# Patient Record
Sex: Male | Born: 1965 | Race: Black or African American | Hispanic: No | Smoking: Current some day smoker
Health system: Southern US, Community
[De-identification: ages and names within clinical notes are randomized; demographics above are authoritative.]

## PROBLEM LIST (undated history)

## (undated) DIAGNOSIS — R45851 Suicidal ideations: Secondary | ICD-10-CM

## (undated) DIAGNOSIS — D571 Sickle-cell disease without crisis: Secondary | ICD-10-CM

## (undated) DIAGNOSIS — R011 Cardiac murmur, unspecified: Secondary | ICD-10-CM

## (undated) DIAGNOSIS — J302 Other seasonal allergic rhinitis: Secondary | ICD-10-CM

## (undated) DIAGNOSIS — D649 Anemia, unspecified: Secondary | ICD-10-CM

## (undated) DIAGNOSIS — J189 Pneumonia, unspecified organism: Secondary | ICD-10-CM

## (undated) DIAGNOSIS — F32A Depression, unspecified: Secondary | ICD-10-CM

## (undated) HISTORY — DX: Other seasonal allergic rhinitis: J30.2

## (undated) HISTORY — DX: Suicidal ideations: R45.851

## (undated) HISTORY — PX: NO PAST SURGERIES: SHX2092

## (undated) HISTORY — PX: ROOT CANAL: SHX2363

---

## 2010-10-20 ENCOUNTER — Emergency Department (HOSPITAL_COMMUNITY)
Admission: EM | Admit: 2010-10-20 | Discharge: 2010-10-20 | Payer: Self-pay | Source: Home / Self Care | Admitting: Emergency Medicine

## 2011-09-22 ENCOUNTER — Emergency Department (HOSPITAL_COMMUNITY)
Admission: EM | Admit: 2011-09-22 | Discharge: 2011-09-22 | Disposition: A | Discharge status: Home / Self Care | Payer: Medicare Other | Attending: Emergency Medicine | Admitting: Emergency Medicine

## 2011-09-22 ENCOUNTER — Encounter: Payer: Self-pay | Admitting: Emergency Medicine

## 2011-09-22 DIAGNOSIS — R6889 Other general symptoms and signs: Secondary | ICD-10-CM | POA: Insufficient documentation

## 2011-09-22 DIAGNOSIS — R059 Cough, unspecified: Secondary | ICD-10-CM | POA: Insufficient documentation

## 2011-09-22 DIAGNOSIS — J069 Acute upper respiratory infection, unspecified: Secondary | ICD-10-CM | POA: Insufficient documentation

## 2011-09-22 DIAGNOSIS — D571 Sickle-cell disease without crisis: Secondary | ICD-10-CM | POA: Insufficient documentation

## 2011-09-22 DIAGNOSIS — J3489 Other specified disorders of nose and nasal sinuses: Secondary | ICD-10-CM | POA: Insufficient documentation

## 2011-09-22 DIAGNOSIS — R05 Cough: Secondary | ICD-10-CM | POA: Insufficient documentation

## 2011-09-22 HISTORY — DX: Sickle-cell disease without crisis: D57.1

## 2011-09-22 HISTORY — DX: Pneumonia, unspecified organism: J18.9

## 2011-09-22 HISTORY — DX: Cardiac murmur, unspecified: R01.1

## 2011-09-22 MED ORDER — DIPHENHYDRAMINE HCL 25 MG PO CAPS
ORAL_CAPSULE | ORAL | Status: AC
  Administered 2011-09-22: 25 mg
  Filled 2011-09-22: qty 1

## 2011-09-22 MED ORDER — DIPHENHYDRAMINE HCL 25 MG PO CAPS: 25.0000 mg | ORAL_CAPSULE | Freq: Once | ORAL | Status: DC

## 2011-09-22 MED ORDER — DIPHENHYDRAMINE HCL 25 MG PO CAPS: ORAL_CAPSULE | ORAL | Status: DC

## 2011-09-22 NOTE — ED Provider Notes (Signed)
Medical screening examination/treatment/procedure(s) were performed by non-physician practitioner and as supervising physician I was immediately available for consultation/collaboration.    Nelia Shi, MD 09/22/11 2045
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
## 2011-09-22 NOTE — ED Provider Notes (Signed)
History     CSN: [id number] Arrival date & time: 09/22/2011  8:02 AM   First MD Initiated Contact with Patient 09/22/11 [phone]      Chief Complaint  Patient presents with  . Cough  . Nasal Congestion    runny nose    (Consider location/radiation/quality/duration/timing/severity/associated sxs/prior treatment) HPI  Patient presents to ER complaining of a 3 day hx of gradual onset runny nose and productive cough as well as a "tickle in throat" that he states has been persistent over the last 3 days despite taking OTC decongestant and cough medicine. Patient states he took an OTC "mucus cough medicine" which "helped loosen up the junk" but states despite taking medications ongoing cough. Denies fevers, chills, HA, sore throat, ear ache, rash, stiff neck, CP, SOB, or wheezing. Patient states his girl friend had similar symptoms about a week ago. Patient states he has hx of sickle cell but takes no meds on regular basis. Denies aggravating or alleviating factors.  Past Medical History  Diagnosis Date  . Sickle cell anemia   . Heart murmur   . Pneumonia     Past Surgical History  Procedure Date  . Root canal     History reviewed. No pertinent family history.  History  Substance Use Topics  . Smoking status: Current Everyday Smoker  . Smokeless tobacco: Never Used  . Alcohol Use: Yes     occasional      Review of Systems  All other systems reviewed and are negative.    Allergies  Review of patient's allergies indicates no known allergies.  Home Medications   Current Outpatient Rx  Name Route Sig Dispense Refill  . FOLIC ACID 1 MG PO TABS Oral Take 1 mg by mouth daily.      Marland Kitchen ZINC PO Oral Take 1 tablet by mouth daily.      Marland Kitchen OVER THE COUNTER MEDICATION Oral Take 2 tablets by mouth daily as needed. Sinus Allergy- for runny nose     . DIPHENHYDRAMINE HCL 25 MG PO CAPS  1 PO q 6 hours for nasal drainage 30 capsule 0    BP 109/52  Pulse 81  Temp(Src) 98.5 F (36.9  C) (Oral)  Resp 18  SpO2 95%  Physical Exam  Vitals reviewed. Constitutional: He is oriented to person, place, and time. He appears well-developed and well-nourished. No distress.  HENT:  Head: Normocephalic and atraumatic.  Right Ear: External ear normal.  Left Ear: External ear normal.  Nose: Nose normal.  Mouth/Throat: No oropharyngeal exudate.       Mild erythema of posterior pharynx and tonsils no tonsillar exudate or enlargement. Patent airway. Swallowing secretions well. Cobbling of posterior pharynx with drainage.  Eyes: Conjunctivae and EOM are normal. Pupils are equal, round, and reactive to light.  Neck: Normal range of motion. Neck supple.  Cardiovascular: Normal rate, regular rhythm and normal heart sounds.  Exam reveals no gallop and no friction rub.   No murmur heard. Pulmonary/Chest: Effort normal and breath sounds normal. No respiratory distress. He has no wheezes. He has no rales. He exhibits no tenderness.  Abdominal: Soft. He exhibits no distension and no mass. There is no tenderness. There is no rebound and no guarding.  Lymphadenopathy:    He has no cervical adenopathy.  Neurological: He is alert and oriented to person, place, and time. He has normal reflexes.  Skin: Skin is warm and dry. No rash noted. He is not diaphoretic.  Psychiatric: He has a normal  mood and affect.    ED Course  Procedures (including critical care time)  PO ibuprofen  Labs Reviewed - No data to display No results found.   1. Upper respiratory tract infection       MDM  Patient afebrile and non toxic appearing. Signs and symptoms of URI with normal lung exam, pulse ox >95% on room air and patient afebrile.         Lenon Oms [place], [place] 09/22/11 [phone]

## 2011-09-22 NOTE — ED Notes (Signed)
Pt reports productive cough and runny nose x 3 days. Yellow secretions noted by pt.

## 2011-11-23 ENCOUNTER — Ambulatory Visit (INDEPENDENT_AMBULATORY_CARE_PROVIDER_SITE_OTHER): Payer: Medicare Other | Admitting: Sports Medicine

## 2011-11-23 ENCOUNTER — Encounter: Payer: Self-pay | Admitting: Sports Medicine

## 2011-11-23 DIAGNOSIS — Z0000 Encounter for general adult medical examination without abnormal findings: Secondary | ICD-10-CM

## 2011-11-23 DIAGNOSIS — J309 Allergic rhinitis, unspecified: Secondary | ICD-10-CM

## 2011-11-23 DIAGNOSIS — D571 Sickle-cell disease without crisis: Secondary | ICD-10-CM

## 2011-11-23 DIAGNOSIS — J302 Other seasonal allergic rhinitis: Secondary | ICD-10-CM

## 2011-11-23 NOTE — Patient Instructions (Signed)
It was nice to meet you today.  We have collected labs and will let you know if there are any abnormal results.  We are waiting to get records from [facility] regarding your past medical history.  Because we do not have documentation of your sickle cell disease we cannot sign your housing form at this time.  Once we have this information we would be glad to fill it out for you.  Please let us know if there is anything we can do for your health care needs.  We would like to see you as needed and would otherwise like to see you in 6 months to see how you are doing.

## 2011-11-24 LAB — COMPREHENSIVE METABOLIC PANEL
ALT: 10 U/L (ref 0–53)
Albumin: 4.2 g/dL (ref 3.5–5.2)
Alkaline Phosphatase: 135 U/L — ABNORMAL HIGH (ref 39–117)
CO2: 24 mEq/L (ref 19–32)
Glucose, Bld: 77 mg/dL (ref 70–99)
Potassium: 4.2 mEq/L (ref 3.5–5.3)
Sodium: 142 mEq/L (ref 135–145)
Total Bilirubin: 1.4 mg/dL — ABNORMAL HIGH (ref 0.3–1.2)
Total Protein: 7.2 g/dL (ref 6.0–8.3)

## 2011-11-24 LAB — CBC
Hemoglobin: 8.8 g/dL — ABNORMAL LOW (ref 13.0–17.0)
MCH: 28.8 pg (ref 26.0–34.0)
MCHC: 33.8 g/dL (ref 30.0–36.0)
Platelets: 202 10*3/uL (ref 150–400)
RBC: 3.06 MIL/uL — ABNORMAL LOW (ref 4.22–5.81)

## 2011-11-29 ENCOUNTER — Encounter: Payer: Self-pay | Admitting: Sports Medicine

## 2011-11-29 DIAGNOSIS — D571 Sickle-cell disease without crisis: Secondary | ICD-10-CM | POA: Insufficient documentation

## 2011-11-29 DIAGNOSIS — J302 Other seasonal allergic rhinitis: Secondary | ICD-10-CM | POA: Insufficient documentation

## 2011-11-29 NOTE — Progress Notes (Signed)
Subjective:  Scott Norton is a 46 y.o. male presenting today to establish care.  He has a known history of Sickle Cell disease with unknown genotype.  He reports that he has previously been seen at [facility] but is living in [place] and would like to establish care here.  Please see history sections for further evaluation.   ROS  Constitutional Denies fatigue or recent pain crisis  Infectious No fevers, no chills  Resp No cough no congestion  Cardiac No chest pain, no palpitations, known heart murmur  GI No abnormal BM, no BRBPR or Melana  GU Denies urinary sx  Skin No rashes  Psych Denies known psych history  Neuro No prior strokes  MSK No MSK issues  Trauma Denies recent or major trauma in past  Activity Very active, reports that as long as he isn't having a pain crisis not limited in his daily life; on disability for his SS  Diet Not assessed  MEDS Denies current immunosuppression but has taken hydroxyurea in the past    Past Medical Hx Reviewed: yes Medications Reviewed: yes Family History Reviewed: yes   PE: GENERAL:  Adult AA male.  Examined in [facility]. Alert and appropriate, In no discomfort, no resp distress, HNEENT: AT/[place], MMM, mild scleral icterus, EOMi THORAX: HEART: RRR, S1/S2 heard, 2-3/6 systolic ejection murmur LUNGS: CTA B, no wheezes, no crackles ABDOMEN:  +BS, soft, non-tender, no rigidity, no guarding, no masses/organomegaly EXTREMITIES: Moves all 4 extremities spontaneously, warm well perfused, no edema,   >PSYCH: Thought content congruent, no SI/HI evident, concerned regarding having paperwork filled out today for his housing

## 2011-11-29 NOTE — Assessment & Plan Note (Addendum)
Pt taking folic acid at this time although not on hydroxyurea.  May be an appropriate pt to refer to [facility] clinic once opened. Would consider providing Pain medication at acute visit for pain crisis to prevent hospitalization in future  Currently unable to fill out "CERTIFICATION OF DISABILITY" for [employer] until further records obtained.  Will have pt sign release to obtain further records for [facility].

## 2011-11-29 NOTE — Assessment & Plan Note (Signed)
Pt to continue with home regimen as currently working for him

## 2011-12-04 ENCOUNTER — Telehealth: Payer: Self-pay | Admitting: Sports Medicine

## 2011-12-04 NOTE — Telephone Encounter (Signed)
Scott Norton came in to make [place] aware that is phone is broke, and he is not able to receive calls at this time. Also inquired about paperwork that he gave Berline Chough on his first visit on 11/23/11 to be completed. Jobie states that Berline Chough needed to receive his medical records for [facility] before completing the paperwork. Wanted the know the progress. Pt would like paperwork to be mailed when completed, as we cannot call him to pick it up.

## 2011-12-09 ENCOUNTER — Emergency Department (HOSPITAL_COMMUNITY)
Admission: EM | Admit: 2011-12-09 | Discharge: 2011-12-09 | Disposition: A | Discharge status: Home / Self Care | Payer: No Typology Code available for payment source | Attending: Emergency Medicine | Admitting: Emergency Medicine

## 2011-12-09 ENCOUNTER — Encounter (HOSPITAL_COMMUNITY): Payer: Self-pay | Admitting: Family Medicine

## 2011-12-09 ENCOUNTER — Emergency Department (HOSPITAL_COMMUNITY): Payer: No Typology Code available for payment source

## 2011-12-09 DIAGNOSIS — D571 Sickle-cell disease without crisis: Secondary | ICD-10-CM | POA: Insufficient documentation

## 2011-12-09 DIAGNOSIS — M542 Cervicalgia: Secondary | ICD-10-CM | POA: Insufficient documentation

## 2011-12-09 DIAGNOSIS — S161XXA Strain of muscle, fascia and tendon at neck level, initial encounter: Secondary | ICD-10-CM | POA: Insufficient documentation

## 2011-12-09 DIAGNOSIS — R011 Cardiac murmur, unspecified: Secondary | ICD-10-CM | POA: Insufficient documentation

## 2011-12-09 DIAGNOSIS — S139XXA Sprain of joints and ligaments of unspecified parts of neck, initial encounter: Secondary | ICD-10-CM | POA: Insufficient documentation

## 2011-12-09 DIAGNOSIS — R209 Unspecified disturbances of skin sensation: Secondary | ICD-10-CM | POA: Insufficient documentation

## 2011-12-09 MED ORDER — IBUPROFEN 800 MG PO TABS
800.0000 mg | ORAL_TABLET | Freq: Once | ORAL | Status: AC
  Administered 2011-12-09: 800 mg via ORAL
  Filled 2011-12-09: qty 1

## 2011-12-09 MED ORDER — CYCLOBENZAPRINE HCL 10 MG PO TABS: 10.0000 mg | ORAL_TABLET | Freq: Three times a day (TID) | ORAL | Status: DC | PRN

## 2011-12-09 MED ORDER — IBUPROFEN 800 MG PO TABS: 800.0000 mg | ORAL_TABLET | Freq: Three times a day (TID) | ORAL | Status: DC
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
## 2011-12-14 ENCOUNTER — Encounter: Payer: Self-pay | Admitting: Sports Medicine

## 2011-12-14 ENCOUNTER — Ambulatory Visit (INDEPENDENT_AMBULATORY_CARE_PROVIDER_SITE_OTHER): Payer: Medicare Other | Admitting: Sports Medicine

## 2011-12-14 VITALS — BP 114/71 | HR 94 | Temp 97.3°F | Ht 75.0 in | Wt 170.1 lb

## 2011-12-14 DIAGNOSIS — S139XXA Sprain of joints and ligaments of unspecified parts of neck, initial encounter: Secondary | ICD-10-CM

## 2011-12-14 DIAGNOSIS — D571 Sickle-cell disease without crisis: Secondary | ICD-10-CM

## 2011-12-14 DIAGNOSIS — S161XXA Strain of muscle, fascia and tendon at neck level, initial encounter: Secondary | ICD-10-CM

## 2011-12-14 MED ORDER — MORPHINE SULFATE 15 MG PO TABS: 15.0000 mg | ORAL_TABLET | ORAL | Status: DC | PRN

## 2011-12-14 NOTE — Patient Instructions (Signed)
It was great to see you today.  I'm sorry you have had such a rough week.  I am giving you a prescription for morphine to be taken as needed for you sickle cell pain.  If you have any fevers, chills, cough, or difficulty breathing please call us immediately.    Regarding you back pain.  You can continue to take the Ibuprofen the ED prescribed.  The best thing for the pains are the exercises that I showed you and to continue keeping moving.    I will plan on seeing you back as needed or in 1 year for a checkup.

## 2011-12-20 ENCOUNTER — Emergency Department (HOSPITAL_COMMUNITY)
Admission: EM | Admit: 2011-12-20 | Discharge: 2011-12-20 | Disposition: A | Discharge status: Home Health | Payer: Medicare Other | Attending: Emergency Medicine | Admitting: Emergency Medicine

## 2011-12-20 ENCOUNTER — Encounter (HOSPITAL_COMMUNITY): Payer: Self-pay | Admitting: Emergency Medicine

## 2011-12-20 ENCOUNTER — Emergency Department (HOSPITAL_COMMUNITY): Payer: Medicare Other

## 2011-12-20 DIAGNOSIS — R011 Cardiac murmur, unspecified: Secondary | ICD-10-CM | POA: Insufficient documentation

## 2011-12-20 DIAGNOSIS — D571 Sickle-cell disease without crisis: Secondary | ICD-10-CM | POA: Insufficient documentation

## 2011-12-20 DIAGNOSIS — M542 Cervicalgia: Secondary | ICD-10-CM | POA: Insufficient documentation

## 2011-12-20 NOTE — ED Notes (Signed)
Pt verbalized understanding of d/c inst

## 2011-12-20 NOTE — Discharge Instructions (Signed)
Cervical Sprain and Strain °A cervical sprain is an injury to the neck. The injury can include either over-stretching or even small tears in the ligaments that hold the bones of the neck in place. A strain affects muscles and tendons. Minor injuries usually only involve ligaments and muscles. Because the different parts of the neck are so close together, more severe injuries can involve both sprain and strain. These injuries can affect the muscles, ligaments, tendons, discs, and nerves in the neck. °CAUSES  °An injury may be the result of a direct blow or from certain habits that can lead to the symptoms noted above. °· Injury from:  °· Contact sports (such as football, rugby, wrestling, hockey, auto racing, gymnastics, diving, martial arts, and boxing).  °· Motor vehicle accidents.  °· Whiplash injuries (see image at right). These are common. They occur when the neck is forcefully whipped or forced backward and/or forward.  °· Falls.  °· Lifestyle or awkward postures:  °· Cradling a telephone between the ear and shoulder.  °· Sitting in a chair that offers no support.  °· Working at an ill-designed computer station.  °· Activities that require hours of repeated or long periods of looking up (stretching the neck backward) or looking down (bending the head/neck forward).  °SYMPTOMS  °· Pain, soreness, stiffness, or burning sensation in the front, back, or sides of the neck. This may develop immediately after injury. Onset of discomfort may also develop slowly and not begin for 24 hours or more.  °· Shoulder and/or upper back pain.  °· Limits to the normal movement of the neck.  °· Headache.  °· Dizziness.  °· Weakness and/or abnormal sensation (such as numbness or tingling) of one or both arms and/or hands.  °· Muscle spasm.  °· Difficulty with swallowing or chewing.  °· Tenderness and swelling at the injury site.  °DIAGNOSIS  °Most of the time, your caregiver can diagnose this problem with a careful history and  examination. The history will include information about known problems (such as arthritis in the neck) or a previous neck injury. X-rays may be ordered to find out if there is a different problem. X-rays can also help to find problems with the bones of the neck not related to the injury or current symptoms. °TREATMENT  °Several treatment options are available to help pain, spasm, and other symptoms. They include: °· Cold helps relieve pain and reduce inflammation. Cold should be applied for 10 to 15 minutes every 2 to 3 hours after any activity that aggravates your symptoms. Use ice packs or an ice massage. Place a towel or cloth in between your skin and the ice pack.  °· Medication:  °· Only take over-the-counter or prescription medicines for pain, discomfort, or fever as directed by your caregiver.  °· Pain relievers or muscle relaxants may be prescribed. Use only as directed and only as much as you need.  °· Change in the activity that caused the problem. This might include using a headset with a telephone so that the phone is not propped between your ear and shoulder.  °· Neck collar. Your caregiver may recommend temporary use of a soft cervical collar.  °· Work station. Changes may be needed in your work place. A better sitting position and/or better posture during work may be part of your treatment.  °· Physical Therapy. Your caregiver may recommend physical therapy. This can include instructions in the use of stretching and strengthening exercises. Improvement in posture is important.   Exercises and posture training can help stabilize the neck and strengthen muscles and keep symptoms from returning.  °HOME CARE INSTRUCTIONS  °Other than formal physical therapy, all treatments above can be done at home. Even when not at work, it is important to be conscious of your posture and of activities that can cause a return of symptoms. °Most cervical sprains and/or strains are better in 1-3 weeks. As you improve and  increase activities, doing a warm up and stretching before the activity will help prevent recurrent problems. °SEEK MEDICAL CARE IF:  °· Pain is not effectively controlled with medication.  °· You feel unable to decrease pain medication over time as planned.  °· Activity level is not improving as planned and/or expected.  °SEEK IMMEDIATE MEDICAL CARE IF:  °· While using medication, you develop any bleeding, stomach upset, or signs of an allergic reaction.  °· Symptoms get worse, become intolerable, and are not helped by medications.  °· New, unexplained symptoms develop.  °· You experience numbness, tingling, weakness, or paralysis of any part of your body.  °MAKE SURE YOU:  °· Understand these instructions.  °· Will watch your condition.  °· Will get help right away if you are not doing well or get worse.  °Document Released: 08/05/2007 Document Revised: 06/20/2011 Document Reviewed: 08/05/2007 °[employer]® Patient Information ©2012 [employer]. °
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
## 2011-12-20 NOTE — ED Provider Notes (Signed)
Medical screening examination/treatment/procedure(s) were performed by non-physician practitioner and as supervising physician I was immediately available for consultation/collaboration.   [place] Latulippe, MD 12/20/11 1538 
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
## 2011-12-25 NOTE — Assessment & Plan Note (Signed)
Some residual MSK soreness - provided exercises and instructed to take ibuprofen -red flags reviewed
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
## 2012-01-10 ENCOUNTER — Telehealth: Payer: Self-pay | Admitting: Sports Medicine

## 2012-01-10 MED ORDER — IBUPROFEN 600 MG PO TABS: 600.0000 mg | ORAL_TABLET | Freq: Four times a day (QID) | ORAL | Status: AC | PRN

## 2012-01-10 NOTE — Telephone Encounter (Signed)
It does not appear that we have received his records.  Please follow up on obtaining these from [facility] . Scott Norton .   Ibuprofen rxd

## 2012-01-10 NOTE — Telephone Encounter (Addendum)
Patient still inquiring about medical records from [facility].  Also need refill on Ibuprophin to be sent to [facility] on [address].

## 2012-01-10 NOTE — Telephone Encounter (Signed)
Addended by: Gaspar Bidding D on: 01/10/2012 01:27 PM   Modules accepted: Orders

## 2012-02-05 ENCOUNTER — Ambulatory Visit (INDEPENDENT_AMBULATORY_CARE_PROVIDER_SITE_OTHER): Payer: Medicare Other | Admitting: Sports Medicine

## 2012-02-05 ENCOUNTER — Encounter: Payer: Self-pay | Admitting: Sports Medicine

## 2012-02-05 VITALS — BP 108/64 | HR 76 | Temp 98.6°F | Ht 75.0 in | Wt 170.0 lb

## 2012-02-05 DIAGNOSIS — Z23 Encounter for immunization: Secondary | ICD-10-CM

## 2012-02-05 DIAGNOSIS — D571 Sickle-cell disease without crisis: Secondary | ICD-10-CM

## 2012-02-05 MED ORDER — TRAMADOL HCL 50 MG PO TABS: 50.0000 mg | ORAL_TABLET | Freq: Four times a day (QID) | ORAL | Status: DC | PRN

## 2012-02-05 NOTE — Assessment & Plan Note (Signed)
Will prescribe tramadol as patient has not tried this in the past.  If patient unable tolerate or not helping with his pain will re-prescribe morphine as this is known to work for him well.  His crisis is typically mild in character and tramadol likely be more appropriate opioid this time.  No signs of complications including acute chest syndrome

## 2012-02-05 NOTE — Progress Notes (Signed)
HPI:  Scott Norton is a 46 y.o. male presenting today for evaluation of a 3 day sickle cell pain crisis.  States that it has been in his ankles his knees elbows hands.  This is typical for him.  Ran out of his morphine approximately 2 weeks prior to the onset does report some constipation with morphine.  Reports the severity was as high as 9/10 but at this time as a 5/10   ROS  Constitutional  no malaise   Infectious  no fevers, no chills   Resp  no cough, no congestion,     Past Medical Hx Reviewed: yes - significant for sickle cell disease (unknown genotype at this time as pending records still from  [facility]) Medications Reviewed: yes Family History Reviewed: yes  PE: GENERAL:  Adult African American male male.  Examined in [facility].  Sitting comfortably on exam table, able to ambulate easily into room  In no discomfort; norespiratory distress.   PSYCH: Alert and appropriately interactive  HNEENT: AT/[place], MMM, no scleral icterus, EOMi THORAX: HEART: RRR, S1/S2 heard, II/VI systolic murmur LUNGS: CTA B, no wheezes, no crackles ABDOMEN:  +BS, soft, non-tender, no rigidity, no guarding, no masses/organomegaly EXTREMITIES: Moves all 4 extremities spontaneously, warm well perfused, no edema, bilateral DP and PT pulses 2/4.  Slight warmth and erythema over right lateral knee.  Otherwise full range of motion and no erythema or warmth associated.

## 2012-02-05 NOTE — Patient Instructions (Addendum)
It was nice to see you today.      Today we discussed:   Sickle Cell Pain Crisis:  I have sent in a prescription for Tramadol.  I expect this medication to work well for you and think that it will keep you from having some of the side effects from the morphine.  Your Smoking:  Keep thinking about cutting back.  There is a 1800 Quit Now to help people with cutting back on their smoking.  I would be glad to talk to you more about your smoking when you are ready  Please plan to return to see me as needed.  If you need anything prior to seeing me please call the clinic.

## 2012-02-09 ENCOUNTER — Encounter (HOSPITAL_COMMUNITY): Payer: Self-pay | Admitting: *Deleted

## 2012-02-09 ENCOUNTER — Emergency Department (HOSPITAL_COMMUNITY)
Admission: EM | Admit: 2012-02-09 | Discharge: 2012-02-09 | Disposition: A | Discharge status: Home Health | Payer: Medicare Other | Attending: Emergency Medicine | Admitting: Emergency Medicine

## 2012-02-09 DIAGNOSIS — L259 Unspecified contact dermatitis, unspecified cause: Secondary | ICD-10-CM | POA: Insufficient documentation

## 2012-02-09 DIAGNOSIS — Z79899 Other long term (current) drug therapy: Secondary | ICD-10-CM | POA: Insufficient documentation

## 2012-02-09 DIAGNOSIS — D5700 Hb-SS disease with crisis, unspecified: Secondary | ICD-10-CM | POA: Insufficient documentation

## 2012-02-09 DIAGNOSIS — T7840XA Allergy, unspecified, initial encounter: Secondary | ICD-10-CM

## 2012-02-09 DIAGNOSIS — F17200 Nicotine dependence, unspecified, uncomplicated: Secondary | ICD-10-CM | POA: Insufficient documentation

## 2012-02-09 DIAGNOSIS — L299 Pruritus, unspecified: Secondary | ICD-10-CM | POA: Insufficient documentation

## 2012-02-09 MED ORDER — DIPHENHYDRAMINE HCL 25 MG PO CAPS
25.0000 mg | ORAL_CAPSULE | Freq: Once | ORAL | Status: AC
  Administered 2012-02-09: 25 mg via ORAL
  Filled 2012-02-09: qty 1

## 2012-02-09 MED ORDER — FAMOTIDINE 20 MG PO TABS
20.0000 mg | ORAL_TABLET | Freq: Once | ORAL | Status: AC
  Administered 2012-02-09: 20 mg via ORAL
  Filled 2012-02-09: qty 1

## 2012-02-09 NOTE — Discharge Instructions (Signed)
Stop taking the Tramadol.  Call your doctor and let him know about the allergic reaction.  Read instructions below to learn more about your diagnosis and for reasons to return to the ED. Followup with your doctor if symptoms are not improving after 3-4 days.  If you do not have a doctor use the resource guide listed below to help he find one. You may return to the emergency department if symptoms worsen, become progressive, or become more concerning.  Allergic Reaction  There are many allergens around [place]. It may be difficult to know what caused your reaction. You may follow up with an allergy specialist for further testing to learn more about your specific allergies.   TREATMENT AND HOME CARE INSTRUCTIONS  If hives or rash are present:  Use an over-the-counter antihistamine (Benadryl or Zyrtec) for hives and itching as needed. Do not drive or drink alcohol until medications used to treat the reaction have worn off. Antihistamines tend to make people sleepy.  Apply cold cloths (compresses) to the skin or take baths in cool water. This will help itching. Avoid hot baths or showers. Heat will make a rash and itching worse.  See Your Primary Care Doctor if:  Your allergies are becoming progressively more troublesome.  You suspect a food allergy. Symptoms generally happen within 30 minutes of eating a food.  Your symptoms have not gone away within 2 days.  SEEK IMMEDIATE MEDICAL CARE IF:  You develop difficulty breathing or wheezing, or have a tight feeling in your chest or throat (feeling like your throat is closing) You develop swollen lips or tongue You develop hives on your chest, neck or face. You are unable to swallow fluids or salvia secretions.   A severe reaction with any of the above problems should be considered life-threatening. If you suddenly develop difficulty breathing call for local emergency medical help. THIS IS AN EMERGENCY.

## 2012-02-09 NOTE — ED Notes (Signed)
The pt thinks he is having an allergic reaction to tramadol.  He has been taking since Tuesday,  He has had a headache for 2 days with hives and itching

## 2012-02-09 NOTE — ED Provider Notes (Signed)
History     CSN: [id number]  Arrival date & time 02/09/12  4098   First MD Initiated Contact with Patient 02/09/12 0700      Chief Complaint  Patient presents with  . Rash    (Consider location/radiation/quality/duration/timing/severity/associated sxs/prior treatment) HPI Comments: Patient reports that he started taking Tramadol 4 days ago and developed a raised pruitic rash 3 days ago.  He states that he has been taking the Tramadol daily since that time and has developed a rash daily.  He states that the rash typically resolves on its own after a couple of hours.  He has not taken anything for the rash.  He denies any swelling of the lips, tongue, or throat.  Denies SOB, wheezing, dyspnea, or difficulty swallowing.  He was taking the Tramadol for Sickle Cell pain.  He has never taken Tramadol in the past.  Patient is a 46 y.o. male presenting with rash. The history is provided by the patient.  Rash     Past Medical History  Diagnosis Date  . Sickle cell anemia   . Heart murmur   . Pneumonia     ?ACS requring intubation  . Seasonal allergies     wool & grass    Past Surgical History  Procedure Date  . Root canal     Family History  Problem Relation Age of Onset  . Alcohol abuse Mother   . Alcohol abuse Father   . Sickle cell trait Mother   . Sickle cell trait Father   . Early death Father     due to alcoholism  . Hypertension Mother   . Hypertension Father   . Hypertension Sister   . Hypertension Brother   . Hypertension Maternal Grandmother   . Hypertension Maternal Grandfather   . Hypertension Paternal Grandmother   . Hypertension Paternal Grandfather     History  Substance Use Topics  . Smoking status: Current Everyday Smoker -- 0.5 packs/day    Types: Cigars  . Smokeless tobacco: Never Used  . Alcohol Use: Yes     occasional but indicated etoh abuse on health hx questionaire      Review of Systems  Constitutional: Negative for fever and chills.    HENT: Negative for facial swelling, rhinorrhea, trouble swallowing and neck stiffness.   Respiratory: Negative for chest tightness, shortness of breath and wheezing.   Gastrointestinal: Negative for nausea and vomiting.  Skin: Positive for rash.    Allergies  Tramadol  Home Medications   Current Outpatient Rx  Name Route Sig Dispense Refill  . CYCLOBENZAPRINE HCL 10 MG PO TABS Oral Take 10 mg by mouth daily as needed. For muscle spasms    . FOLIC ACID 1 MG PO TABS Oral Take 1 mg by mouth daily.      BP 111/67  Pulse 60  Temp(Src) 98.1 F (36.7 C) (Oral)  Resp 16  SpO2 96%  Physical Exam  Nursing note and vitals reviewed. Constitutional: He is oriented to person, place, and time. He appears well-developed and well-nourished. No distress.  HENT:  Head: Normocephalic and atraumatic.  Mouth/Throat: Oropharynx is clear and moist and mucous membranes are normal.       No sign of airway obstruction. No edema of face, eyelids, lips, tongue, uvula.Marland Kitchen Uvula midline, no nasal congestion or drooling.  Tongue not elevated. No trismus.  Neck: Trachea normal, normal range of motion and full passive range of motion without pain. Neck supple. Carotid bruit is not present. No tracheal  deviation present.       No stridor  Cardiovascular: Normal rate, regular rhythm, intact distal pulses and normal pulses.        Not tachycardic  Pulmonary/Chest: Effort normal. No stridor.  Musculoskeletal: Normal range of motion.  Neurological: He is alert and oriented to person, place, and time.  Skin: Skin is warm and intact. Rash noted. Rash is urticarial. He is not diaphoretic.       Not diaphoretic. Raised erythematous welts, pruritic in nature, located on the upper right arm. Blanchable urticaria, no petechiae or purpura.   Psychiatric: He has a normal mood and affect. His behavior is normal.    ED Course  Procedures (including critical care time)  Labs Reviewed - No data to display No results  found.   No diagnosis found.    MDM  Patient re-evaluated prior to dc, is hemodynamically stable, in no respiratory distress, and denies the feeling of throat closing. Pt has been advised to take OTC benadryl & return to the ED if they have a mod-severe allergic rxn (s/s including throat closing, difficulty breathing, swelling of lips face or tongue). Pt is to follow up with their PCP. Pt is agreeable with plan & verbalizes understanding.  Patient also instructed to stop taking the Tramadol.        Pascal Lux [place], PA-C 02/09/12 1816

## 2012-02-10 NOTE — ED Provider Notes (Signed)
Medical screening examination/treatment/procedure(s) were performed by non-physician practitioner and as supervising physician I was immediately available for consultation/collaboration.   Tiyah Zelenak, MD 02/10/12 2051 

## 2012-02-11 ENCOUNTER — Telehealth: Payer: Self-pay | Admitting: Sports Medicine

## 2012-02-11 DIAGNOSIS — D571 Sickle-cell disease without crisis: Secondary | ICD-10-CM

## 2012-02-11 NOTE — Telephone Encounter (Signed)
Patient is calling because he had an allergic reaction to Tramadol and wasnts to know if something else can be prescribed.

## 2012-02-12 ENCOUNTER — Other Ambulatory Visit: Payer: Self-pay | Admitting: Sports Medicine

## 2012-02-12 DIAGNOSIS — D571 Sickle-cell disease without crisis: Secondary | ICD-10-CM

## 2012-02-12 MED ORDER — MORPHINE SULFATE 15 MG PO TABS: 15.0000 mg | ORAL_TABLET | ORAL | Status: AC | PRN

## 2012-02-12 MED ORDER — MORPHINE SULFATE 15 MG PO TABS: 15.0000 mg | ORAL_TABLET | ORAL | Status: DC | PRN

## 2012-02-12 NOTE — Telephone Encounter (Signed)
Spoke with patient and informed him of below 

## 2012-02-12 NOTE — Telephone Encounter (Signed)
Will write for [facility].  Please call and have pt come pick up rx.

## 2012-03-12 ENCOUNTER — Other Ambulatory Visit: Payer: Self-pay | Admitting: Sports Medicine

## 2012-03-12 NOTE — Telephone Encounter (Signed)
Patient is calling because he is having Sickle Cell pain and would like a refill of Morphine.  He is hoping that he won't have to be seen to get his medication but if he does, his MD, does not have any opening until well into June.  Please call him back with the decision.

## 2012-03-13 MED ORDER — MORPHINE SULFATE 15 MG PO TABS: 15.0000 mg | ORAL_TABLET | ORAL | Status: DC | PRN
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
## 2012-05-06 ENCOUNTER — Encounter: Payer: Self-pay | Admitting: Sports Medicine

## 2012-05-13 ENCOUNTER — Ambulatory Visit: Payer: Medicare Other | Admitting: Sports Medicine

## 2012-06-03 ENCOUNTER — Encounter: Payer: Self-pay | Admitting: Sports Medicine

## 2012-06-04 ENCOUNTER — Encounter: Payer: Self-pay | Admitting: Sports Medicine

## 2012-06-04 ENCOUNTER — Ambulatory Visit (INDEPENDENT_AMBULATORY_CARE_PROVIDER_SITE_OTHER): Payer: Medicare Other | Admitting: Sports Medicine

## 2012-06-04 VITALS — BP 106/62 | HR 72 | Temp 98.2°F | Ht 75.0 in | Wt 162.0 lb

## 2012-06-04 DIAGNOSIS — Z136 Encounter for screening for cardiovascular disorders: Secondary | ICD-10-CM

## 2012-06-04 DIAGNOSIS — D571 Sickle-cell disease without crisis: Secondary | ICD-10-CM

## 2012-06-04 DIAGNOSIS — Z0000 Encounter for general adult medical examination without abnormal findings: Secondary | ICD-10-CM | POA: Insufficient documentation

## 2012-06-04 LAB — BASIC METABOLIC PANEL
BUN: 8 mg/dL (ref 6–23)
Calcium: 9.3 mg/dL (ref 8.4–10.5)
Glucose, Bld: 76 mg/dL (ref 70–99)
Potassium: 4.6 mEq/L (ref 3.5–5.3)

## 2012-06-04 MED ORDER — MORPHINE SULFATE 15 MG PO TABS: 15.0000 mg | ORAL_TABLET | ORAL | Status: AC | PRN

## 2012-06-04 MED ORDER — IBUPROFEN 600 MG PO TABS: 600.0000 mg | ORAL_TABLET | Freq: Three times a day (TID) | ORAL | Status: AC | PRN

## 2012-06-04 NOTE — Patient Instructions (Addendum)
It was good to see you today.  I would be happy to fill out anything you need to help you with your housing.  I have refilled her morphine and would like for you to take ibuprofen when you're painful or start.  We are checking labs today he is abnormal I will be in touch with you.  Please follow up in 6 months or sooner if needed for a refill of your pain medicine.

## 2012-06-04 NOTE — Progress Notes (Signed)
  [facility]  Patient name: Scott Norton MRN [id number]  Date of birth: May 10, 1966  CC & HPI:  Scott Norton is a 46 y.o. male presenting today for follow-up of Sickle Cell disease.  Reports doing well overall.  No specific concerns today.  Reports pain has been doing well and is out of home meds.  Mild relief with pain meds.    ROS:  No chest pain, no current limb or finger pain.  No dizziness, lightheaded.  No cough, congestion.   Pertinent History Reviewed:  Medical & Surgical Hx:  Reviewed: Significant for Sickle Cell Disease Medications: Reviewed & Updated - see associated section Social History: Reviewed - Significant for occasional cigars.    Objective Findings:  Vitals:  Filed Vitals:   06/04/12 0931  BP: 106/62  Pulse: 72  Temp: 98.2 F (36.8 C)   GENERAL:  Adult African American male male.  Examined in [facility].  Sitting comfortably on exam table, able to ambulate easily into room  In no discomfort; norespiratory distress.   PSYCH: Alert and appropriately interactive  HNEENT: AT/[place], MMM, no scleral icterus, EOMi THORAX: HEART: RRR, S1/S2 heard, II/VI systolic murmur LUNGS: CTA B, no wheezes, no crackles ABDOMEN:  +BS, soft, non-tender, no rigidity, no guarding, no masses/organomegaly EXTREMITIES: Moves all 4 extremities spontaneously, warm well perfused, no edema, bilateral DP and PT pulses 2/4.  Slight warmth and erythema over right lateral knee.  Otherwise full range of motion and no erythema or warmth associated.    Assessment & Plan:

## 2012-06-05 LAB — CBC WITH DIFFERENTIAL/PLATELET
Basophils Absolute: 0 10*3/uL (ref 0.0–0.1)
Lymphocytes Relative: 32 % (ref 12–46)
Lymphs Abs: 2.4 10*3/uL (ref 0.7–4.0)
Neutro Abs: 3.9 10*3/uL (ref 1.7–7.7)
Neutrophils Relative %: 54 % (ref 43–77)
Platelets: 232 10*3/uL (ref 150–400)
RBC: 3.13 MIL/uL — ABNORMAL LOW (ref 4.22–5.81)
RDW: 24.9 % — ABNORMAL HIGH (ref 11.5–15.5)
WBC: 7.3 10*3/uL (ref 4.0–10.5)

## 2012-06-05 LAB — LIPID PANEL
LDL Cholesterol: 60 mg/dL (ref 0–99)
Triglycerides: 135 mg/dL (ref <150)
VLDL: 27 mg/dL (ref 0–40)

## 2012-06-06 LAB — HEMOGLOBINOPATHY EVALUATION
Hgb A2 Quant: 3.6 % — ABNORMAL HIGH (ref 2.2–3.2)
Hgb A: 0 % — ABNORMAL LOW (ref 96.8–97.8)
Hgb F Quant: 3.2 % — ABNORMAL HIGH (ref 0.0–2.0)
Hgb S Quant: 93.2 % — ABNORMAL HIGH

## 2012-06-08 NOTE — Assessment & Plan Note (Signed)
Will provide rx for morphine prn Discussed use of NSAIDs as well

## 2012-06-08 NOTE — Assessment & Plan Note (Signed)
Will check Renal function, cholesterol and CBC

## 2012-08-13 ENCOUNTER — Ambulatory Visit
Admission: RE | Admit: 2012-08-13 | Discharge: 2012-08-13 | Disposition: A | Discharge status: Home / Self Care | Payer: Medicare Other | Source: Ambulatory Visit | Attending: Family Medicine | Admitting: Family Medicine

## 2012-08-13 ENCOUNTER — Ambulatory Visit (INDEPENDENT_AMBULATORY_CARE_PROVIDER_SITE_OTHER): Payer: Medicare Other | Admitting: Family Medicine

## 2012-08-13 ENCOUNTER — Encounter: Payer: Self-pay | Admitting: Family Medicine

## 2012-08-13 VITALS — BP 135/80 | HR 72 | Temp 98.2°F | Ht 75.0 in | Wt 170.0 lb

## 2012-08-13 DIAGNOSIS — J988 Other specified respiratory disorders: Secondary | ICD-10-CM

## 2012-08-13 DIAGNOSIS — D571 Sickle-cell disease without crisis: Secondary | ICD-10-CM

## 2012-08-13 DIAGNOSIS — D5700 Hb-SS disease with crisis, unspecified: Secondary | ICD-10-CM

## 2012-08-13 LAB — CBC WITH DIFFERENTIAL/PLATELET
Basophils Relative: 1 % (ref 0–1)
Hemoglobin: 8.9 g/dL — ABNORMAL LOW (ref 13.0–17.0)
Lymphocytes Relative: 33 % (ref 12–46)
MCHC: 33.5 g/dL (ref 30.0–36.0)
Monocytes Relative: 13 % — ABNORMAL HIGH (ref 3–12)
Neutro Abs: 3.5 10*3/uL (ref 1.7–7.7)
Neutrophils Relative %: 49 % (ref 43–77)
RBC: 3.16 MIL/uL — ABNORMAL LOW (ref 4.22–5.81)
WBC: 7.1 10*3/uL (ref 4.0–10.5)

## 2012-08-13 MED ORDER — MORPHINE SULFATE 15 MG PO TABS: 15.0000 mg | ORAL_TABLET | ORAL | Status: DC | PRN

## 2012-08-13 MED ORDER — LEVOFLOXACIN 500 MG PO TABS: 500.0000 mg | ORAL_TABLET | Freq: Every day | ORAL | Status: DC

## 2012-08-13 NOTE — Patient Instructions (Addendum)
You may have a bacterial pneumonia, but it is more likely that you have a viral infection. I would like to get a chest X-ray at the hospital. Also, we will get blood at the clinic today. If either of these things are abnormal, then I will tell you to start the antibiotic. If you start to feel worse or develop a fever, then please start the medication.   Also, you can take the pain medication as needed for sickle pain.   Take Care,   Dr. Clinton Sawyer

## 2012-08-13 NOTE — Assessment & Plan Note (Signed)
Check Hgb and prescribe Morphine 15 mg PO q 6hr PRN, # 30.

## 2012-08-13 NOTE — Progress Notes (Signed)
  Subjective:    Patient ID: Scott Norton, male    DOB: 1966/08/03, 46 y.o.   MRN: [id number]  HPI  46 year old M with sickle cell anemia presenting with cough and congestion. 4 days of nasal congestion, drainage, and prodcutive cough. He denies fever and dyspnea. He has no known sick contacts. He also notes worsening bone pain over the last week that he assciats with the weather becoming colder and with his illness. He notes that he has pain in his extremities mainly. For pain he takes morphine 15 mg PO PRN. He last took a dose last week and last had it refilled by Dr. Berline Chough in August, with 30 pills dispensed. He has Dilaudid and Percocet listed on his med list, which he states were prescribed by his dentist after a root canal in July. He is no longer taking those medications. Today, he main concern is treatment for his infection.   PMH: Acute chest syndrome with whole blood transfusion in 2009 - Hospitalized at [facility]: lives alone   Review of Systems     Objective:   Physical Exam BP 135/80  Pulse 72  Temp 98.2 F (36.8 C) (Oral)  Ht 6\' 3"  (1.905 m)  Wt 170 lb (77.111 kg)  BMI 21.25 kg/m2 Gen: middle age AAF, thin body habitus, mildy ill appearing, coughing during exam HEENT: NCAT, PERRLA, OP clear and moist, no tonsillar inflammation or exudate, no ear effusions CV: RRR, 2/6 SEM Pulm: CTA-B, no consolidation Abd: no palpable spleen, non tender     Assessment & Plan:  46 y.o M with sickle cell disease disease who presents with a respiratory infection and worsening pain.

## 2012-08-13 NOTE — Assessment & Plan Note (Signed)
This is likely viral, but he is at risk of complications 2/2 his sickle cell disease. Obtain chest X-ray. Patient given Rx for Levoquin to start if he develops fever or dyspnea. If CXR demonstrates consolidation, then another reason to start Levoquin. F/u PRN.

## 2012-08-14 ENCOUNTER — Telehealth: Payer: Self-pay | Admitting: Family Medicine

## 2012-08-14 NOTE — Telephone Encounter (Signed)
I called Scott Norton as a follow up to his visit yesterday. I notified him that his chest X-ray and CBC were normal. He also states that he is feeling better. Therefore, he does not need to take antibiotics. He is in agreement with this plan.

## 2012-10-13 ENCOUNTER — Encounter: Payer: Self-pay | Admitting: Family Medicine

## 2012-10-13 ENCOUNTER — Ambulatory Visit (INDEPENDENT_AMBULATORY_CARE_PROVIDER_SITE_OTHER): Payer: Medicare Other | Admitting: Family Medicine

## 2012-10-13 VITALS — BP 114/71 | HR 82 | Temp 98.4°F

## 2012-10-13 DIAGNOSIS — D571 Sickle-cell disease without crisis: Secondary | ICD-10-CM

## 2012-10-13 DIAGNOSIS — D5700 Hb-SS disease with crisis, unspecified: Secondary | ICD-10-CM

## 2012-10-13 LAB — POCT HEMOGLOBIN: Hemoglobin: 8.7 g/dL — AB (ref 14.1–18.1)

## 2012-10-13 MED ORDER — IBUPROFEN 600 MG PO TABS: 600.0000 mg | ORAL_TABLET | Freq: Three times a day (TID) | ORAL | Status: DC | PRN

## 2012-10-13 MED ORDER — MORPHINE SULFATE 15 MG PO TABS: 15.0000 mg | ORAL_TABLET | ORAL | Status: DC | PRN

## 2012-10-13 NOTE — Assessment & Plan Note (Signed)
Refilled morphine 15 mg #30 and ibuprofen 600 mg #60.    Patient also requests letter- would like to move from group housing to private apartment due to concerns about communicable disease from housemates.  States he has spoken with PCP about this before.  I advised, Iw ould send him this message, and asked him if he has a form from a place of residence (he is currently looking for housing), to drop it off at the office

## 2012-10-13 NOTE — Patient Instructions (Addendum)
I will message your primary doctor about your letter request.  He may need to see you in the office or speak to again about this. I recommend a flu shot yearly  Sickle Cell Pain Crisis Sickle cell crisis happens when some of the red blood cells that are the wrong shape (sickle shaped instead of round) get stuck in your small blood vessels. This blocks blood flow through these vessels. HOME CARE To prevent a sickle cell crisis:  Adults should drink at least 8 glasses of liquid every day. For children, this amount should be less.   Do not drink beer, wine, or hard liquor.   Take your medicines as told by your doctor.   Keep all appointments with your doctor.   Find ways to lessen any stress you may have.   Get 8 hours of sleep every night.   Dress warmly in cold weather.   Do not go to places that are high up in the mountains.   If you cut or scrape yourself, keep your wound clean. Cover it with a bandage.   Exercise as your doctor tells you. Do not exercise so hard or so long that you sweat.  GET HELP RIGHT AWAY IF:  You have bad pain.   You have a bad headache.   You have trouble breathing.   You have chest pain.   You feel weak or very tired.   You start to throw up (vomit).   You have watery poop (diarrhea).   You have a temperature by mouth above 102 F (38.9 C), not controlled by medicine.   Your baby is older than 3 months with a rectal temperature of 102 F (38.9 C) or higher.   Your baby is 69 months old or younger with a rectal temperature of 100.4 F (38 C) or higher.  MAKE SURE YOU:    Understand these instructions.   Will watch this condition.   Will get help right away if you are not doing well or get worse.  Document Released: 03/28/2010 Document Revised: 12/31/2011 Document Reviewed: 03/28/2010 [facility] Patient Information 2013 [place], [place].

## 2012-10-13 NOTE — Progress Notes (Signed)
  Subjective:    Patient ID: Scott Norton, male    DOB: 02-Feb-1966, 46 y.o.   MRN: [id number]  HPI     Work in appt for several days of sickle cell pain  History of sickle cell SS  Pain in right elbow, bilateral wrists, bilateral knees, consistent with previous pain episodes.  No chest pain or dyspnea. No fever.  Notes no known trigger except for possible weather change.  Out of pain medicines.  I have reviewed patient's  PMH, FH, and Social history and Medications as related to this visit. History of acute chest in past    Review of Systems See Hpi    Objective:   Physical Exam  GEN: Alert & Oriented, No acute distress, well appearing, comfortable. CV:  Regular Rate & Rhythm, no murmur Respiratory:  Normal work of breathing, CTAB Abd:  + BS, soft, no tenderness to palpation Ext: no pre-tibial edema. No swelling or erythema around elbows, wrists, knees.       Assessment & Plan:

## 2012-10-28 ENCOUNTER — Ambulatory Visit (INDEPENDENT_AMBULATORY_CARE_PROVIDER_SITE_OTHER): Payer: Medicare Other | Admitting: Sports Medicine

## 2012-10-28 ENCOUNTER — Encounter: Payer: Self-pay | Admitting: Sports Medicine

## 2012-10-28 VITALS — BP 122/78 | HR 88 | Temp 98.3°F | Ht 75.0 in | Wt 164.3 lb

## 2012-10-28 DIAGNOSIS — D5700 Hb-SS disease with crisis, unspecified: Secondary | ICD-10-CM

## 2012-10-28 DIAGNOSIS — D571 Sickle-cell disease without crisis: Secondary | ICD-10-CM

## 2012-10-28 MED ORDER — MORPHINE SULFATE 15 MG PO TABS: 15.0000 mg | ORAL_TABLET | ORAL | Status: DC | PRN

## 2012-10-28 NOTE — Patient Instructions (Addendum)
It was nice to see you today.   Today we discussed: 1. Sickle cell anemia with pain  I have refilled your pain meds  Follow up in 2 weeks  [facility] YOUR HANDS and return for the Flu shot ASAP  Please drop off a form if you need me to fill it out for your housing   Please plan to return to see me in 2-4 months.  If you need anything prior to seeing me please call the clinic.  Please Bring all medications with you to each appointment.

## 2012-10-28 NOTE — Assessment & Plan Note (Signed)
Doing well.  But pain has been worth with weather changes.  Using Pain meds Will refill pain meds knows only for rest of month Issues with housing; may be getting evicted.  May need letter / forms but nothing specific requested today.   F/u in 2 months. Flu Shot declined today in spite of counseling

## 2012-10-31 NOTE — Progress Notes (Signed)
  [facility]  Patient name: Scott Norton MRN [id number]  Date of birth: 01/28/1966  CC & HPI:  Scott Norton is a 47 y.o. male presenting today for follow-up of Sickle Cell disease.  Reports doing well overall.   Has concerns that his current housing is not suitable for his condition due to exposure to respiratory illnesses.    Reports pain has been doing well but has been needing increased pain meds since weather has gotten worse, is currently out of home meds.  Good relief with pain meds  ROS:  No chest pain, no current limb or finger pain.  No dizziness, lightheaded.  No cough, congestion.   Pertinent History Reviewed:  Medical & Surgical Hx:  Reviewed: Significant for Sickle Cell Disease Medications: Reviewed & Updated - see associated section Social History: Reviewed - Significant for occasional cigars.    Objective Findings:  Vitals:  Filed Vitals:   10/28/12 1410  BP: 122/78  Pulse: 88  Temp: 98.3 F (36.8 C)   GENERAL:  Adult African American male male.  Examined in [facility].  Sitting comfortably on exam table, able to ambulate easily into room  In no discomfort; norespiratory distress.   PSYCH: Alert and appropriately interactive  HNEENT: AT/[place], MMM, no scleral icterus, EOMi THORAX: HEART: RRR, S1/S2 heard, II/VI systolic murmur LUNGS: CTA B, no wheezes, no crackles ABDOMEN:  +BS, soft, non-tender, no rigidity, no guarding, no masses/organomegaly EXTREMITIES: Moves all 4 extremities spontaneously, warm well perfused, no edema, bilateral DP and PT pulses 2/4.  Slight warmth and erythema over right lateral knee.  Otherwise full range of motion and no erythema or warmth associated.    Assessment & Plan:

## 2012-12-15 ENCOUNTER — Ambulatory Visit (INDEPENDENT_AMBULATORY_CARE_PROVIDER_SITE_OTHER): Payer: Medicare Other | Admitting: *Deleted

## 2012-12-15 DIAGNOSIS — Z23 Encounter for immunization: Secondary | ICD-10-CM

## 2014-06-30 DIAGNOSIS — G8929 Other chronic pain: Secondary | ICD-10-CM | POA: Insufficient documentation

## 2014-07-19 ENCOUNTER — Ambulatory Visit (INDEPENDENT_AMBULATORY_CARE_PROVIDER_SITE_OTHER): Payer: Medicare Other | Admitting: Family Medicine

## 2014-07-19 ENCOUNTER — Encounter: Payer: Self-pay | Admitting: Family Medicine

## 2014-07-19 VITALS — BP 123/73 | HR 56 | Temp 98.2°F | Ht 75.0 in | Wt 176.3 lb

## 2014-07-19 DIAGNOSIS — D571 Sickle-cell disease without crisis: Secondary | ICD-10-CM

## 2014-07-19 DIAGNOSIS — Z0000 Encounter for general adult medical examination without abnormal findings: Secondary | ICD-10-CM

## 2014-07-19 LAB — BASIC METABOLIC PANEL
BUN: 8 mg/dL (ref 6–23)
CALCIUM: 10 mg/dL (ref 8.4–10.5)
CO2: 25 meq/L (ref 19–32)
CREATININE: 0.9 mg/dL (ref 0.50–1.35)
Chloride: 104 mEq/L (ref 96–112)
GLUCOSE: 83 mg/dL (ref 70–99)
Potassium: 3.9 mEq/L (ref 3.5–5.3)
SODIUM: 139 meq/L (ref 135–145)

## 2014-07-19 MED ORDER — FOLIC ACID 1 MG PO TABS: 1.0000 mg | ORAL_TABLET | Freq: Every day | ORAL | Status: DC

## 2014-07-19 MED ORDER — OXYCODONE-ACETAMINOPHEN 5-325 MG PO TABS: 1.0000 | ORAL_TABLET | Freq: Three times a day (TID) | ORAL | Status: DC | PRN

## 2014-07-19 NOTE — Assessment & Plan Note (Signed)
Overall he has been doing well, in portion was suffering during sickle cell crisis I can vary to not having a PCP. He does attempt to stay well-hydrated and rest during the crisis. He does not want to be on morphine because it made him too sleepy, so we prescribed Percocet 5/325 today #60 with no refills. He is aware he is to only take this medication with over-the-counter medications and hydration does not work for his crisis. He is under the understanding that this medication should last at least a few months and that these will not be refilled monthly. She declined his flu shot today. Discussed in great detail the benefits of having a flu shot especially with sickle cell disease. Followup in one year, or sooner if needed.

## 2014-07-19 NOTE — Progress Notes (Signed)
   Subjective:    Patient ID: Scott Norton, male    DOB: [birth year], 48 y.o.   MRN: [id number]  HPI Scott Norton is a 48 y.o. with Sickle cell disease presents to clinic for re-establishment  Sickle cell: Patient is a 48 year old sickle cell disease patient has lost followup with this clinic over the past 2 years. He has moved to [facility], and has been unsuccessful in finding a doctor there. Patient's typical pain crisis was in his elbows, knees and shoulders. He has been hospitalized for pain crisis in the distant past. He states he only has pain crisis every few years. In a pain crisis he attempts to hydrate and usually sticks it out. He states no over-the-counter medications seemed to help. He has not had a splenectomy. She has used morphine in the past, with good result, but he states he does not want any morphine because it made him too tired and he was not able to function. He had been on folate 1 mg daily, but has been out of this for at least a year. His hemoglobin typically is about 8-9.   Health maintenance: Declines flu shot today.  Current everyday smoker, uses vaporizes and has cut back on nicotine level.   Past Medical History  Diagnosis Date  . Sickle cell anemia   . Heart murmur   . Pneumonia     ?ACS requring intubation  . Seasonal allergies     wool & grass   Past Surgical History  Procedure Laterality Date  . Root canal      Review of Systems Per hpi    Objective:   Physical Exam BP 123/73  Pulse 56  Temp(Src) 98.2 F (36.8 C) (Oral)  Ht  (1.905 m)  Wt 176 lb 4.8 oz (79.969 kg)  BMI 22.04 kg/m2 Gen: Very pleasant, cooperative [place] American male. Appears younger than stated age. No acute distress, nontoxic in appearance. HEENT: AT. [place]. Bilateral eyes without injections or icterus. MMM. CV: RRR, 2/6 systolic murmur appreciated no clicks gallops or rubs Chest: CTAB, no wheeze or crackles. Good air movement. Abd: Soft. Flat. NTND. BS present. No  Masses palpated.  Ext: No erythema. No edema. 2/4 DP/PT. Skin: No rashes, purpura or petechiae. Well perfused. Neuro:  Normal gait. PERLA. EOMi. Alert. Cranial nerves II through X intact. Muscle strength 5/5 bilateral upper and lower extremities. Psych: Normal dress, affect, demeanor and mood. [name].     Assessment & Plan:

## 2014-07-19 NOTE — Patient Instructions (Signed)
Keeping you healthy  Get these tests  Blood pressure- Have your blood pressure checked once a year by your healthcare provider.  Normal blood pressure is 120/80  Weight- Have your body mass index (BMI) calculated to screen for obesity.  BMI is a measure of body fat based on height and weight. You can also calculate your own BMI at [email or web address].  Cholesterol- Have your cholesterol checked every year.  Diabetes- Have your blood sugar checked regularly if you have high blood pressure, high cholesterol, have a family history of diabetes or if you are overweight.  Screening for Colon Cancer- Colonoscopy starting at age 32.  Screening may begin sooner depending on your family history and other health conditions. Follow up colonoscopy as directed by your Gastroenterologist.  Screening for Prostate Cancer- Both blood work (PSA) and a rectal exam help screen for Prostate Cancer.  Screening begins at age 32 with African-American men and at age 62 with Caucasian men.  Screening may begin sooner depending on your family history.  Take these medicines  Aspirin- One aspirin daily can help prevent Heart disease and Stroke.  Flu shot- Every fall.  Tetanus- Every 10 years.  Zostavax- Once after the age of 42 to prevent Shingles.  Pneumonia shot- Once after the age of 52; if you are younger than 76, ask your healthcare provider if you need a Pneumonia shot.  Take these steps  Don't smoke- If you do smoke, talk to your doctor about quitting.  For tips on how to quit, go to www.smokefree.gov or call 1-800-QUIT-NOW.  Be physically active- Exercise 5 days a week for at least 30 minutes.  If you are not already physically active start slow and gradually work up to 30 minutes of moderate physical activity.  Examples of moderate activity include walking briskly, mowing the yard, dancing, swimming, bicycling, etc.  Eat a healthy diet- Eat a variety of healthy food such as fruits, vegetables, low  fat milk, low fat cheese, yogurt, lean meant, poultry, fish, beans, tofu, etc. For more information go to www.thenutritionsource.org  Drink alcohol in moderation- Limit alcohol intake to less than two drinks a day. Never drink and drive.  Dentist- Brush and floss twice daily; visit your dentist twice a year.  Depression- Your emotional health is as important as your physical health. If you're feeling down, or losing interest in things you would normally enjoy please talk to your healthcare provider.  Eye exam- Visit your eye doctor every year.  Safe sex- If you may be exposed to a sexually transmitted infection, use a condom.  Seat belts- Seat belts can save your life; always wear one.  Smoke/Carbon Monoxide detectors- These detectors need to be installed on the appropriate level of your home.  Replace batteries at least once a year.  Skin cancer- When out in the sun, cover up and use sunscreen 15 SPF or higher.  Violence- If anyone is threatening you, please tell your healthcare provider.  Living Will/ Health care power of attorney- Speak with your healthcare provider and family.  I will call you with lab results when they become available. I have prescribed you Percocet to be used only in a pain crisis that is not relieved by hydration and over-the-counter products. You have declined her flu shot today, however I recommend that you do eventually receive one. This should be kept up yearly.

## 2014-07-20 ENCOUNTER — Encounter: Payer: Self-pay | Admitting: Family Medicine

## 2014-07-20 LAB — CBC WITH DIFFERENTIAL/PLATELET
BASOS PCT: 0 % (ref 0–1)
Basophils Absolute: 0 10*3/uL (ref 0.0–0.1)
EOS ABS: 0.2 10*3/uL (ref 0.0–0.7)
EOS PCT: 3 % (ref 0–5)
HCT: 22.5 % — ABNORMAL LOW (ref 39.0–52.0)
HEMOGLOBIN: 7.2 g/dL — AB (ref 13.0–17.0)
Lymphocytes Relative: 38 % (ref 12–46)
Lymphs Abs: 2.9 10*3/uL (ref 0.7–4.0)
MCH: 27.7 pg (ref 26.0–34.0)
MCHC: 32 g/dL (ref 30.0–36.0)
MCV: 86.5 fL (ref 78.0–100.0)
MONO ABS: 1 10*3/uL (ref 0.1–1.0)
MONOS PCT: 13 % — AB (ref 3–12)
NEUTROS PCT: 46 % (ref 43–77)
Neutro Abs: 3.5 10*3/uL (ref 1.7–7.7)
Platelets: 254 10*3/uL (ref 150–400)
RBC: 2.6 MIL/uL — ABNORMAL LOW (ref 4.22–5.81)
RDW: 26.5 % — ABNORMAL HIGH (ref 11.5–15.5)
WBC: 7.6 10*3/uL (ref 4.0–10.5)

## 2015-01-31 DIAGNOSIS — E559 Vitamin D deficiency, unspecified: Secondary | ICD-10-CM | POA: Insufficient documentation

## 2015-03-31 ENCOUNTER — Ambulatory Visit: Payer: Medicare Other | Admitting: Family Medicine

## 2015-04-11 ENCOUNTER — Ambulatory Visit (INDEPENDENT_AMBULATORY_CARE_PROVIDER_SITE_OTHER): Payer: Medicare Other | Admitting: Family Medicine

## 2015-04-11 ENCOUNTER — Encounter: Payer: Self-pay | Admitting: Family Medicine

## 2015-04-11 ENCOUNTER — Telehealth: Payer: Self-pay | Admitting: Family Medicine

## 2015-04-11 VITALS — BP 124/78 | HR 80 | Temp 98.3°F | Ht 75.0 in | Wt 177.0 lb

## 2015-04-11 DIAGNOSIS — D5700 Hb-SS disease with crisis, unspecified: Secondary | ICD-10-CM | POA: Diagnosis not present

## 2015-04-11 DIAGNOSIS — D571 Sickle-cell disease without crisis: Secondary | ICD-10-CM

## 2015-04-11 LAB — CBC
HCT: 19.9 % — ABNORMAL LOW (ref 39.0–52.0)
HEMOGLOBIN: 6.5 g/dL — AB (ref 13.0–17.0)
MCH: 27.9 pg (ref 26.0–34.0)
MCHC: 32.7 g/dL (ref 30.0–36.0)
MCV: 85.4 fL (ref 78.0–100.0)
MPV: 9.8 fL (ref 8.6–12.4)
PLATELETS: 224 10*3/uL (ref 150–400)
RBC: 2.33 MIL/uL — AB (ref 4.22–5.81)
RDW: 26.8 % — ABNORMAL HIGH (ref 11.5–15.5)
WBC: 9.5 10*3/uL (ref 4.0–10.5)

## 2015-04-11 LAB — RETICULOCYTES
ABS Retic: 228.3 10*3/uL — ABNORMAL HIGH (ref 19.0–186.0)
RBC.: 2.33 MIL/uL — AB (ref 4.22–5.81)
RETIC CT PCT: 9.8 % — AB (ref 0.4–2.3)

## 2015-04-11 MED ORDER — OXYCODONE-ACETAMINOPHEN 5-325 MG PO TABS: 1.0000 | ORAL_TABLET | Freq: Three times a day (TID) | ORAL | Status: DC | PRN

## 2015-04-11 NOTE — Progress Notes (Signed)
   Subjective:    Patient ID: Hesham Wommack, male    DOB: [birth year], 49 y.o.   MRN: [id number]  HPI  Sickle cell pain crisis:  49 year old sickle cell disease patient. Patient reports a pain crisis in his shoulders and knees today. He has been hospitalized in the past for pain crisis, but this was a long time ago. He reports the pain crisis every few years, and it usually controlled with minimal pain medications in the outpatient setting and increasing his fluids. He is allergic to tramadol. His hemoglobin is approximately 8.5 at its baseline. He denies any shortness of breath or chest pain today.  Current every day smoker Past Medical History  Diagnosis Date  . Sickle cell anemia   . Heart murmur   . Pneumonia     ?ACS requring intubation  . Seasonal allergies     wool & grass   Allergies  Allergen Reactions  . Tramadol Hives and Itching   Past Surgical History  Procedure Laterality Date  . Root canal      Review of Systems Per history of present illness    Objective:   Physical Exam BP 124/78 mmHg  Pulse 80  Temp(Src) 98.3 F (36.8 C) (Oral)  Ht 6\' 3"  (1.905 m)  Wt 177 lb (80.287 kg)  BMI 22.12 kg/m2  SpO2 96% Gen: NAD. Nontoxic in appearance, well-developed, well-nourished, African-American male, very pleasant, cooperative with exam. HEENT: AT. [place].  Bilateral eyes without injections or icterus. MMM.  CV: RRR 2/3 systolic murmur present Chest: CTAB, no wheeze or crackles Abd: Soft. Flat. NTND. BS present. No Masses palpated.  Ext: No erythema. No edema. No bruising. No effusions. Tender to palpation bilateral shoulders, and bilateral knees diffusely. Neurovascularly intact distally. Skin: No rashes, purpura or petechiae.  Neuro: Normal gait. PERLA. EOMi. Alert. Oriented. Cranial nerves II through XII intact. Muscle strength 5/5 upper and lower extremity.    Assessment & Plan:  Tahari Aber is a 49 y.o. African-American male with sickle cell pain crisis. Patient is  in mild crisis today, refilled oxycodone. He declined an IM injection of Toradol in the clinic. CBC and retic obtained. Red flags discussed, patient encouraged to go to the emergency room if he acutely become short of breath, has chest pain or increased pain uncontrolled by his medications. May be a chronic candidate for sickle cell clinic, will see about making referral in the future, he is not open to referral at this time secondary to car troubles he is fearful he will be able to get there.

## 2015-04-11 NOTE — Telephone Encounter (Signed)
Critical lab value called in from [facility].  Patient has a hemoglobin of 6.5 in the setting of sickle cell pain crisis.  On review of his note by his PCP today he had what he considered a rare  crisis that he felt would be controlled with oral pain medications.  She gave him very good return precautions and according to her documentation I think that he will seek emergency care if his pain is uncontrolled.  Considering his hemoglobin was 7.2 in September of last year and his retic count of 9.8% today, I think he would only necessitate transfusion if his pain was uncontrolled. According to what they discussed today if his pain is uncontrolled he will seek help in the emergency department.  At this time I will defer follow-up of this lab to his PCP and forward this message to her.  Murtis Sink, MD [facility] Resident, PGY-3 04/11/2015, 11:10 PM

## 2015-04-11 NOTE — Assessment & Plan Note (Signed)
Scott Norton is a 49 y.o. African-American male with sickle cell pain crisis. Patient is in mild crisis today, refilled oxycodone. He declined an IM injection of Toradol in the clinic. CBC and retic obtained. Red flags discussed, patient encouraged to go to the emergency room if he acutely become short of breath, has chest pain or increased pain uncontrolled by his medications. May be a chronic candidate for sickle cell clinic, will see about making referral in the future, he is not open to referral at this time secondary to car troubles he is fearful he will be able to get there.

## 2015-04-11 NOTE — Patient Instructions (Signed)
Stay well-hydrated.  I will call you with results to labs once they become available If for any reason you feel short of breath, your pain worsens and isn't controlled with her medications or increased fatigue, please go to the emergency room to be seen. I will see if I can get a referral to the sickle cell clinic for you.

## 2015-04-11 NOTE — Telephone Encounter (Signed)
-----   Message from Raliegh Ip, DO sent at 04/11/2015 10:58 PM EDT -----   ----- Message -----    From: Raliegh Ip, DO    Sent: 04/11/2015  10:40 PM      To: Raliegh Ip, DO  hgb 6.5

## 2015-04-12 ENCOUNTER — Telehealth: Payer: Self-pay | Admitting: Family Medicine

## 2015-04-12 NOTE — Telephone Encounter (Signed)
Please call patient and make him aware that his hemoglobin was very low at 6.5. 2 months ago he was at 7.24 his hemoglobin. This is likely secondary to his sickle cell crisis. Please encourage him to stay well-hydrated, use his oxycodone for pain control. If he finds himself with chest pain, shortness of breath, increased pain that cannot be controlled or increased fatigue, then he needs to be seen immediately in the emergency room and will need a blood transfusion at that time. Thank you.

## 2015-04-13 NOTE — Telephone Encounter (Signed)
Called patient and informed of info per Dr. Claiborne Billings.  Patient verbalized understanding about signs/symptoms and to go to ED if he has any.  Altamese Dilling, BSN, RN-BC

## 2016-11-01 DIAGNOSIS — E559 Vitamin D deficiency, unspecified: Secondary | ICD-10-CM | POA: Diagnosis not present

## 2016-11-01 DIAGNOSIS — D571 Sickle-cell disease without crisis: Secondary | ICD-10-CM | POA: Diagnosis not present

## 2016-11-01 DIAGNOSIS — M791 Myalgia: Secondary | ICD-10-CM | POA: Diagnosis not present

## 2016-11-01 DIAGNOSIS — G8929 Other chronic pain: Secondary | ICD-10-CM | POA: Diagnosis not present

## 2016-11-01 DIAGNOSIS — Z79899 Other long term (current) drug therapy: Secondary | ICD-10-CM | POA: Diagnosis not present

## 2016-11-01 DIAGNOSIS — Z885 Allergy status to narcotic agent status: Secondary | ICD-10-CM | POA: Diagnosis not present

## 2016-11-04 ENCOUNTER — Encounter (HOSPITAL_COMMUNITY): Payer: Self-pay | Admitting: Emergency Medicine

## 2016-11-04 ENCOUNTER — Emergency Department (HOSPITAL_COMMUNITY)
Admission: EM | Admit: 2016-11-04 | Discharge: 2016-11-04 | Disposition: A | Discharge status: Home / Self Care | Payer: Medicare Other | Attending: Emergency Medicine | Admitting: Emergency Medicine

## 2016-11-04 DIAGNOSIS — M5489 Other dorsalgia: Secondary | ICD-10-CM | POA: Diagnosis not present

## 2016-11-04 DIAGNOSIS — D5700 Hb-SS disease with crisis, unspecified: Secondary | ICD-10-CM | POA: Diagnosis not present

## 2016-11-04 DIAGNOSIS — Z79899 Other long term (current) drug therapy: Secondary | ICD-10-CM | POA: Insufficient documentation

## 2016-11-04 DIAGNOSIS — R52 Pain, unspecified: Secondary | ICD-10-CM | POA: Diagnosis not present

## 2016-11-04 DIAGNOSIS — F17290 Nicotine dependence, other tobacco product, uncomplicated: Secondary | ICD-10-CM | POA: Diagnosis not present

## 2016-11-04 LAB — COMPREHENSIVE METABOLIC PANEL
ALBUMIN: 4 g/dL (ref 3.5–5.0)
ALT: 15 U/L — ABNORMAL LOW (ref 17–63)
ANION GAP: 7 (ref 5–15)
AST: 32 U/L (ref 15–41)
Alkaline Phosphatase: 189 U/L — ABNORMAL HIGH (ref 38–126)
BILIRUBIN TOTAL: 4.5 mg/dL — AB (ref 0.3–1.2)
BUN: 8 mg/dL (ref 6–20)
CHLORIDE: 110 mmol/L (ref 101–111)
CO2: 23 mmol/L (ref 22–32)
Calcium: 8.9 mg/dL (ref 8.9–10.3)
Creatinine, Ser: 0.73 mg/dL (ref 0.61–1.24)
GFR calc Af Amer: >60 mL/min (ref >60)
GFR calc non Af Amer: >60 mL/min (ref >60)
GLUCOSE: 99 mg/dL (ref 65–99)
POTASSIUM: 3.5 mmol/L (ref 3.5–5.1)
Sodium: 140 mmol/L (ref 135–145)
TOTAL PROTEIN: 7.3 g/dL (ref 6.5–8.1)

## 2016-11-04 LAB — RETICULOCYTES
RBC.: 2.49 MIL/uL — ABNORMAL LOW (ref 4.22–5.81)
RETIC COUNT ABSOLUTE: 358.6 10*3/uL — AB (ref 19.0–186.0)
Retic Ct Pct: 14.4 % — ABNORMAL HIGH (ref 0.4–3.1)

## 2016-11-04 LAB — CBC WITH DIFFERENTIAL/PLATELET
BASOS ABS: 0 10*3/uL (ref 0.0–0.1)
Basophils Relative: 0 %
Eosinophils Absolute: 0.2 10*3/uL (ref 0.0–0.7)
Eosinophils Relative: 2 %
HCT: 21 % — ABNORMAL LOW (ref 39.0–52.0)
HEMOGLOBIN: 7.4 g/dL — AB (ref 13.0–17.0)
LYMPHS PCT: 26 %
Lymphs Abs: 2.8 10*3/uL (ref 0.7–4.0)
MCH: 29.7 pg (ref 26.0–34.0)
MCHC: 35.2 g/dL (ref 30.0–36.0)
MCV: 84.3 fL (ref 78.0–100.0)
MONOS PCT: 8 %
Monocytes Absolute: 0.9 10*3/uL (ref 0.1–1.0)
NEUTROS ABS: 6.9 10*3/uL (ref 1.7–7.7)
Neutrophils Relative %: 64 %
Platelets: 199 10*3/uL (ref 150–400)
RBC: 2.49 MIL/uL — AB (ref 4.22–5.81)
RDW: 25.6 % — ABNORMAL HIGH (ref 11.5–15.5)
WBC: 10.8 10*3/uL — AB (ref 4.0–10.5)

## 2016-11-04 MED ORDER — HYDROMORPHONE HCL 1 MG/ML IJ SOLN
1.0000 mg | INTRAMUSCULAR | Status: AC
  Administered 2016-11-04: 1 mg via INTRAVENOUS
  Filled 2016-11-04: qty 1

## 2016-11-04 MED ORDER — DEXTROSE-NACL 5-0.2 % IV SOLN
INTRAVENOUS | Status: DC
  Administered 2016-11-04: 09:00:00 via INTRAVENOUS
  Filled 2016-11-04: qty 1000

## 2016-11-04 MED ORDER — HYDROMORPHONE HCL 1 MG/ML IJ SOLN
1.0000 mg | INTRAMUSCULAR | Status: DC | PRN
  Administered 2016-11-04 (×2): 1 mg via INTRAVENOUS
  Filled 2016-11-04 (×2): qty 1

## 2016-11-04 MED ORDER — HYDROCODONE-ACETAMINOPHEN 5-325 MG PO TABS: 1.0000 | ORAL_TABLET | ORAL | 0 refills | Status: DC | PRN

## 2016-11-04 MED ORDER — KETOROLAC TROMETHAMINE 30 MG/ML IJ SOLN
30.0000 mg | Freq: Once | INTRAMUSCULAR | Status: AC
  Administered 2016-11-04: 30 mg via INTRAVENOUS
  Filled 2016-11-04: qty 1

## 2016-11-04 MED ORDER — IBUPROFEN 800 MG PO TABS: 800.0000 mg | ORAL_TABLET | Freq: Three times a day (TID) | ORAL | 0 refills | Status: DC

## 2016-11-04 NOTE — Discharge Instructions (Signed)
Ibuprofen and/or Vicodin for pain.  Follow-up with your primary care physician.

## 2016-11-04 NOTE — ED Triage Notes (Signed)
Brought in by EMS from home with c/o sickle cell pain crisis.  Pt reported that he started having significant pain to his back, and "now, it's starting to spread to his shoulders".

## 2016-11-04 NOTE — ED Provider Notes (Signed)
MSE was initiated and I personally evaluated the patient and placed orders (if any) at  6:28 AM on November 04, 2016.  The patient appears stable so that the remainder of the MSE may be completed by another provider.  Patient presents with sickle cell pain. Reports back pain. Patient states that he "is lucky" it does not take daily pain medication. Last sickle cell crisis was over 10 years ago per the patient. He has not taken anything for his pain at home. He denies any chest pain, shortness of breath, fevers. He states that normally his pain is in his left wrist. Labs obtained. Reticulocyte count elevated. Persistent anemia at baseline. Patient ordered pain medication.   Shon Batonourtney F Melvenia Favela, MD 11/04/16 [id number]

## 2016-11-04 NOTE — ED Provider Notes (Signed)
[facility]-EMERGENCY DEPT Provider Note   CSN: [id number] Arrival date & time: 11/04/16  0505     History   Chief Complaint Chief Complaint  Patient presents with  . Sickle Cell Pain Crisis    HPI Scott Norton is a 51 y.o. male. He presents evaluation of back pain and shoulder pain and elbow pain. His history sickle cell anemia. He is an infrequent visitor to the emergency room. He has not standing prescription at home for Lortab that he takes. On average she takes this only 2 days per week. He states his last trip to the emergency room for a flareup was 2007.  Denies being short of breath. Has not had fever. No change in providers her baseline health or medications.  HPI  Past Medical History:  Diagnosis Date  . Heart murmur   . Pneumonia    ?ACS requring intubation  . Seasonal allergies    wool & grass  . Sickle cell anemia [facility])     Patient Active Problem List   Diagnosis Date Noted  . Sickle cell pain crisis (HCC) 04/11/2015  . Respiratory infection 08/13/2012  . Healthcare maintenance 06/04/2012  . Cervical strain, acute 12/09/2011  . Sickle cell anemia (HCC) 11/29/2011  . Seasonal allergies 11/29/2011    Past Surgical History:  Procedure Laterality Date  . ROOT CANAL         Home Medications    Prior to Admission medications   Medication Sig Start Date End Date Taking? Authorizing Provider  folic acid (FOLVITE) 1 MG tablet Take 1 tablet (1 mg total) by mouth daily. 07/19/14  Yes Renee A Kuneff, DO  oxyCODONE-acetaminophen (ROXICET) 5-325 MG per tablet Take 1 tablet by mouth every 8 (eight) hours as needed for severe pain. 04/11/15  Yes Renee A Kuneff, DO  POTASSIUM PO Take 1 tablet by mouth every 7 (seven) days.   Yes Historical Provider, MD  Vitamin D, Ergocalciferol, (DRISDOL) 50000 units CAPS capsule Take 1 capsule by mouth every 30 (thirty) days. 05/03/16  Yes Historical Provider, MD  HYDROcodone-acetaminophen (NORCO/VICODIN) 5-325 MG tablet Take 1 tablet by  mouth every 4 (four) hours as needed. 11/04/16   Rolland Porter, MD  ibuprofen (ADVIL,MOTRIN) 800 MG tablet Take 1 tablet (800 mg total) by mouth 3 (three) times daily. 11/04/16   Rolland Porter, MD  morphine (MSIR) 15 MG tablet Take 1 tablet (15 mg total) by mouth every 4 (four) hours as needed for pain. Patient not taking: Reported on 11/04/2016 10/28/12   Andrena Mews, DO    Family History Family History  Problem Relation Age of Onset  . Alcohol abuse Mother   . Sickle cell trait Mother   . Hypertension Mother   . Alcohol abuse Father   . Sickle cell trait Father   . Early death Father     due to alcoholism  . Hypertension Father   . Hypertension Sister   . Hypertension Brother   . Hypertension Maternal Grandmother   . Hypertension Maternal Grandfather   . Hypertension Paternal Grandmother   . Hypertension Paternal Grandfather     Social History Social History  Substance Use Topics  . Smoking status: Current Every Day Smoker    Packs/day: 0.50    Types: Cigars  . Smokeless tobacco: Never Used  . Alcohol use Yes     Comment: occasional but indicated etoh abuse on health hx questionaire     Allergies   Tramadol   Review of Systems Review of Systems  Constitutional: Negative for appetite change, chills, diaphoresis, fatigue and fever.  HENT: Negative for mouth sores, sore throat and trouble swallowing.   Eyes: Negative for visual disturbance.  Respiratory: Negative for cough, chest tightness, shortness of breath and wheezing.   Cardiovascular: Negative for chest pain.  Gastrointestinal: Negative for abdominal distention, abdominal pain, diarrhea, nausea and vomiting.  Endocrine: Negative for polydipsia, polyphagia and polyuria.  Genitourinary: Negative for dysuria, frequency and hematuria.  Musculoskeletal: Positive for arthralgias, back pain and myalgias. Negative for gait problem.  Skin: Negative for color change, pallor and rash.  Neurological: Negative for dizziness,  syncope, light-headedness and headaches.  Hematological: Does not bruise/bleed easily.  Psychiatric/Behavioral: Negative for behavioral problems and confusion.     Physical Exam Updated Vital Signs BP 120/73 (BP Location: Left Arm)   Pulse 92   Resp 16   Ht 6\' 3"  (1.905 m)   Wt 177 lb (80.3 kg)   SpO2 96%   BMI 22.12 kg/m   Physical Exam  Constitutional: He is oriented to person, place, and time. He appears well-developed and well-nourished. No distress.  HENT:  Head: Normocephalic.  Eyes: Conjunctivae are normal. Pupils are equal, round, and reactive to light. No scleral icterus.  Neck: Normal range of motion. Neck supple. No thyromegaly present.  Cardiovascular: Normal rate and regular rhythm.  Exam reveals no gallop and no friction rub.   No murmur heard. Pulmonary/Chest: Effort normal and breath sounds normal. No respiratory distress. He has no wheezes. He has no rales.  Abdominal: Soft. Bowel sounds are normal. He exhibits no distension. There is no tenderness. There is no rebound.  Musculoskeletal: Normal range of motion.  Neurological: He is alert and oriented to person, place, and time.  Skin: Skin is warm and dry. No rash noted.  Psychiatric: He has a normal mood and affect. His behavior is normal.     ED Treatments / Results  Labs (all labs ordered are listed, but only abnormal results are displayed) Labs Reviewed  COMPREHENSIVE METABOLIC PANEL - Abnormal; Notable for the following:       Result Value   ALT 15 (*)    Alkaline Phosphatase 189 (*)    Total Bilirubin 4.5 (*)    All other components within normal limits  CBC WITH DIFFERENTIAL/PLATELET - Abnormal; Notable for the following:    WBC 10.8 (*)    RBC 2.49 (*)    Hemoglobin 7.4 (*)    HCT 21.0 (*)    RDW 25.6 (*)    All other components within normal limits  RETICULOCYTES - Abnormal; Notable for the following:    Retic Ct Pct 14.4 (*)    RBC. 2.49 (*)    Retic Count, Manual 358.6 (*)    All  other components within normal limits    EKG  EKG Interpretation None       Radiology No results found.  Procedures Procedures (including critical care time)  Medications Ordered in ED Medications  ketorolac (TORADOL) 30 MG/ML injection 30 mg (30 mg Intravenous Given 11/04/16 0640)  HYDROmorphone (DILAUDID) injection 1 mg (1 mg Intravenous Given 11/04/16 0908)  HYDROmorphone (DILAUDID) injection 1 mg (1 mg Intravenous Given 11/04/16 0939)  HYDROmorphone (DILAUDID) injection 1 mg (1 mg Intravenous Given 11/04/16 1006)     Initial Impression / Assessment and Plan / ED Course  I have reviewed the triage vital signs and the nursing notes.  Pertinent labs & imaging results that were available during my care of the patient were reviewed  by me and considered in my medical decision making (see chart for details).  Clinical Course     This is a narcotic nave patient was sickle cell anemia and acute exacerbation. Has increase in reticulocytes. Baseline hemoglobin. No chest complaints clear lungs afebrile and well oxygenated.  Given IV fluids with D5 half-normal saline at maintenance. Given intermittent doses of IV Dilaudid. Symptoms have improved. He is appropriate for discharge home. We'll give prescription for limited number of pain medication and ibuprofen for home.  Final Clinical Impressions(s) / ED Diagnoses   Final diagnoses:  Sickle cell pain crisis [facility])    New Prescriptions Discharge Medication List as of 11/04/2016 10:49 AM    START taking these medications   Details  HYDROcodone-acetaminophen (NORCO/VICODIN) 5-325 MG tablet Take 1 tablet by mouth every 4 (four) hours as needed., Starting Sun 11/04/2016, Print    ibuprofen (ADVIL,MOTRIN) 800 MG tablet Take 1 tablet (800 mg total) by mouth 3 (three) times daily., Starting Sun 11/04/2016, Print         Rolland Porter, MD 11/04/16 [phone]

## 2016-11-04 NOTE — ED Notes (Signed)
Bed: [id number] Expected date:  Expected time:  Means of arrival:  Comments: 51 yo M/ SCC

## 2016-12-09 ENCOUNTER — Emergency Department (HOSPITAL_COMMUNITY)
Admission: EM | Admit: 2016-12-09 | Discharge: 2016-12-09 | Disposition: A | Discharge status: Home / Self Care | Payer: Medicare Other | Attending: Emergency Medicine | Admitting: Emergency Medicine

## 2016-12-09 ENCOUNTER — Encounter (HOSPITAL_COMMUNITY): Payer: Self-pay | Admitting: Emergency Medicine

## 2016-12-09 ENCOUNTER — Emergency Department (HOSPITAL_COMMUNITY): Payer: Medicare Other

## 2016-12-09 DIAGNOSIS — Z79899 Other long term (current) drug therapy: Secondary | ICD-10-CM | POA: Insufficient documentation

## 2016-12-09 DIAGNOSIS — N50819 Testicular pain, unspecified: Secondary | ICD-10-CM

## 2016-12-09 DIAGNOSIS — F17290 Nicotine dependence, other tobacco product, uncomplicated: Secondary | ICD-10-CM | POA: Diagnosis not present

## 2016-12-09 DIAGNOSIS — R52 Pain, unspecified: Secondary | ICD-10-CM

## 2016-12-09 DIAGNOSIS — N50812 Left testicular pain: Secondary | ICD-10-CM | POA: Insufficient documentation

## 2016-12-09 MED ORDER — DOXYCYCLINE HYCLATE 100 MG PO CAPS: ORAL_CAPSULE | ORAL | 0 refills | Status: DC

## 2016-12-09 MED ORDER — HYDROCODONE-ACETAMINOPHEN 5-325 MG PO TABS
1.0000 | ORAL_TABLET | Freq: Once | ORAL | Status: AC
  Administered 2016-12-09: 1 via ORAL
  Filled 2016-12-09: qty 1

## 2016-12-09 MED ORDER — LIDOCAINE HCL (PF) 1 % IJ SOLN
INTRAMUSCULAR | Status: AC
  Administered 2016-12-09: 1 mL
  Filled 2016-12-09: qty 5

## 2016-12-09 MED ORDER — CEFTRIAXONE SODIUM 250 MG IJ SOLR
250.0000 mg | Freq: Once | INTRAMUSCULAR | Status: AC
  Administered 2016-12-09: 250 mg via INTRAMUSCULAR
  Filled 2016-12-09: qty 250

## 2016-12-09 NOTE — ED Notes (Signed)
Pt reports pain in his testicles for the past 2 days. Pt he was doing much of anything and his testicles started aching.

## 2016-12-09 NOTE — ED Provider Notes (Signed)
[facility]-EMERGENCY DEPT Provider Note   CSN: [id number] Arrival date & time: 12/09/16  0419     History   Chief Complaint Chief Complaint  Patient presents with  . Testicle Pain    HPI Scott Norton is a 51 y.o. male.  The history is provided by the patient.  Testicle Pain  This is a new problem. The current episode started 2 days ago. The problem occurs constantly. The problem has been gradually worsening. Pertinent negatives include no chest pain, no abdominal pain and no shortness of breath. Exacerbated by: movement. The symptoms are relieved by rest.  pt reports onset of left testicle pain over past 2 days Denies trauma No dysuria No penile discharge No new back pain No fever/vomiting No abd pain He reports recent unprotected sexual contact, he thinks he has STD He reports he recently masturbated and the fluid was discolored    Past Medical History:  Diagnosis Date  . Heart murmur   . Pneumonia    ?ACS requring intubation  . Seasonal allergies    wool & grass  . Sickle cell anemia [facility])     Patient Active Problem List   Diagnosis Date Noted  . Sickle cell pain crisis (HCC) 04/11/2015  . Respiratory infection 08/13/2012  . Healthcare maintenance 06/04/2012  . Cervical strain, acute 12/09/2011  . Sickle cell anemia (HCC) 11/29/2011  . Seasonal allergies 11/29/2011    Past Surgical History:  Procedure Laterality Date  . ROOT CANAL         Home Medications    Prior to Admission medications   Medication Sig Start Date End Date Taking? Authorizing Provider  folic acid (FOLVITE) 1 MG tablet Take 1 tablet (1 mg total) by mouth daily. 07/19/14   Renee A Kuneff, DO  HYDROcodone-acetaminophen (NORCO/VICODIN) 5-325 MG tablet Take 1 tablet by mouth every 4 (four) hours as needed. 11/04/16   Rolland Porter, MD  ibuprofen (ADVIL,MOTRIN) 800 MG tablet Take 1 tablet (800 mg total) by mouth 3 (three) times daily. 11/04/16   Rolland Porter, MD  morphine (MSIR) 15 MG tablet Take 1  tablet (15 mg total) by mouth every 4 (four) hours as needed for pain. Patient not taking: Reported on 11/04/2016 10/28/12   Andrena Mews, DO  oxyCODONE-acetaminophen (ROXICET) 5-325 MG per tablet Take 1 tablet by mouth every 8 (eight) hours as needed for severe pain. 04/11/15   Renee A Kuneff, DO  POTASSIUM PO Take 1 tablet by mouth every 7 (seven) days.    Historical Provider, MD  Vitamin D, Ergocalciferol, (DRISDOL) 50000 units CAPS capsule Take 1 capsule by mouth every 30 (thirty) days. 05/03/16   Historical Provider, MD    Family History Family History  Problem Relation Age of Onset  . Alcohol abuse Mother   . Sickle cell trait Mother   . Hypertension Mother   . Alcohol abuse Father   . Sickle cell trait Father   . Early death Father     due to alcoholism  . Hypertension Father   . Hypertension Sister   . Hypertension Brother   . Hypertension Maternal Grandmother   . Hypertension Maternal Grandfather   . Hypertension Paternal Grandmother   . Hypertension Paternal Grandfather     Social History Social History  Substance Use Topics  . Smoking status: Current Every Day Smoker    Packs/day: 0.50    Types: Cigars  . Smokeless tobacco: Never Used  . Alcohol use Yes     Comment: occasional but indicated  etoh abuse on health hx questionaire     Allergies   Tramadol   Review of Systems Review of Systems  Constitutional: Negative for fever.  Respiratory: Negative for shortness of breath.   Cardiovascular: Negative for chest pain.  Gastrointestinal: Negative for abdominal pain.  Genitourinary: Positive for testicular pain. Negative for discharge and dysuria.  All other systems reviewed and are negative.    Physical Exam Updated Vital Signs BP 132/74 (BP Location: Right Arm)   Pulse 68   Temp 97.8 F (36.6 C) (Oral)   Resp 16   Ht 6\' 3"  (1.905 m)   Wt 80.7 kg   SpO2 100%   BMI 22.25 kg/m   Physical Exam  CONSTITUTIONAL: Well developed/well nourished HEAD:  Normocephalic/atraumatic EYES: EOMI ENMT: Mucous membranes moist NECK: supple no meningeal signs SPINE/BACK:entire spine nontender CV: S1/S2 noted, no murmurs/rubs/gallops noted LUNGS: Lungs are clear to auscultation bilaterally, no apparent distress ABDOMEN: soft, nontender GU:no cva tenderness, left testicle enlarged and tender to palpation.  No overlying erythema or crepitus.  No inguinal hernia.  Nurse chaperone present for exam NEURO: Pt is awake/alert/appropriate, moves all extremitiesx4.  No facial droop.   EXTREMITIES: pulses normal/equal, full ROM SKIN: warm, color normal PSYCH: no abnormalities of mood noted, alert and oriented to situation  ED Treatments / Results  Labs (all labs ordered are listed, but only abnormal results are displayed) Labs Reviewed  GC/CHLAMYDIA PROBE AMP ([place]    EKG  EKG Interpretation None       Radiology [place] Scrotum  Result Date: 12/09/2016 CLINICAL DATA:  Initial evaluation for acute left testicular pain. EXAM: ULTRASOUND OF SCROTUM TECHNIQUE: Complete ultrasound examination of the testicles, epididymis, and other scrotal structures was performed. COMPARISON:  None available. FINDINGS: Right testicle Measurements: 4.4 x 2.4 x 3.4 cm. No mass identified. Scattered microlithiasis. Left testicle Measurements: 4.1 x 2.5 x 2.8 cm. No mass identified. Scattered microlithiasis. Right epididymis:  Normal in size and appearance. Left epididymis: Normal in size and appearance. Small complex cyst at the left epididymal head measured 1.0 x 0.6 x 0.9 cm. This is of doubtful significance. Hydrocele:  None visualized. Varicocele:  None visualized. IMPRESSION: 1. No evidence for testicular mass, torsion, or other acute abnormality. 2. Testicular microlithiasis. Current literature suggests that testicular microlithiasis is not a significant independent risk factor for development of testicular carcinoma, and that follow up imaging is not  warranted in the absence of other risk factors. Monthly testicular self-examination and annual physical exams are considered appropriate surveillance. If patient has other risk factors for testicular carcinoma, then referral to Urology should be considered. (Reference: DeCastro, et al.: A 5-Year Follow up Study of Asymptomatic Men with Testicular Microlithiasis. J Urol 2008; 179:1420-1423.) Electronically Signed   By: Rise MuBenjamin  McClintock M.D.   On: 12/09/2016 06:18   [place] Art/ven Flow Abd Pelv Doppler  Result Date: 12/09/2016 CLINICAL DATA:  Initial evaluation for acute left testicular pain. EXAM: ULTRASOUND OF SCROTUM TECHNIQUE: Complete ultrasound examination of the testicles, epididymis, and other scrotal structures was performed. COMPARISON:  None available. FINDINGS: Right testicle Measurements: 4.4 x 2.4 x 3.4 cm. No mass identified. Scattered microlithiasis. Left testicle Measurements: 4.1 x 2.5 x 2.8 cm. No mass identified. Scattered microlithiasis. Right epididymis:  Normal in size and appearance. Left epididymis: Normal in size and appearance. Small complex cyst at the left epididymal head measured 1.0 x 0.6 x 0.9 cm. This is of doubtful significance. Hydrocele:  None visualized. Varicocele:  None visualized.  IMPRESSION: 1. No evidence for testicular mass, torsion, or other acute abnormality. 2. Testicular microlithiasis. Current literature suggests that testicular microlithiasis is not a significant independent risk factor for development of testicular carcinoma, and that follow up imaging is not warranted in the absence of other risk factors. Monthly testicular self-examination and annual physical exams are considered appropriate surveillance. If patient has other risk factors for testicular carcinoma, then referral to Urology should be considered. (Reference: DeCastro, et al.: A 5-Year Follow up Study of Asymptomatic Men with Testicular Microlithiasis. J Urol 2008; 179:1420-1423.) Electronically  Signed   By: Rise Mu M.D.   On: 12/09/2016 06:18    Procedures Procedures   Medications Ordered in ED Medications  cefTRIAXone (ROCEPHIN) injection 250 mg (not administered)  HYDROcodone-acetaminophen (NORCO/VICODIN) 5-325 MG per tablet 1 tablet (1 tablet Oral Given 12/09/16 1610)     Initial Impression / Assessment and Plan / ED Course  I have reviewed the triage vital signs and the nursing notes.  Pertinent labs & imaging results that were available during my care of the patient were reviewed by me and considered in my medical decision making (see chart for details).     Pt stable No distress US imaging findings reviewed and discussed with patient He is still concerned for STD.  He did have significant tenderness on exam Will treat with rocephin and doxycycline Discussed need for urology followup in next 2 weeks for further evaluation of testicle pain and US findings Pt agreeable with plan D/c instructions on epididimytis given to patient   Final Clinical Impressions(s) / ED Diagnoses   Final diagnoses:  Testicle pain  Pain in left testicle    New Prescriptions New Prescriptions   DOXYCYCLINE (VIBRAMYCIN) 100 MG CAPSULE    One po bid x 14 days     Zadie Rhine, MD 12/09/16 [phone]

## 2016-12-09 NOTE — ED Notes (Signed)
Pt reports pain in his testicles for the past 2 days. Pt he was doing much of anything and his testicles started aching.  

## 2016-12-09 NOTE — ED Notes (Signed)
Pt to radiology via stretcher.  

## 2016-12-09 NOTE — ED Notes (Signed)
Per Dr. Bebe ShaggyWickline, NPO except for meds

## 2016-12-09 NOTE — ED Triage Notes (Signed)
Pt reports pain in his testicles for the past 2 days. Pt he was doing much of anything and his testicles started aching.  

## 2016-12-10 LAB — GC/CHLAMYDIA PROBE AMP (~~LOC~~) NOT AT ARMC
Chlamydia: NEGATIVE
NEISSERIA GONORRHEA: NEGATIVE

## 2017-07-15 DIAGNOSIS — M79671 Pain in right foot: Secondary | ICD-10-CM | POA: Diagnosis not present

## 2017-08-13 DIAGNOSIS — I517 Cardiomegaly: Secondary | ICD-10-CM | POA: Diagnosis not present

## 2017-08-13 DIAGNOSIS — Z5181 Encounter for therapeutic drug level monitoring: Secondary | ICD-10-CM | POA: Diagnosis not present

## 2017-08-13 DIAGNOSIS — Z01118 Encounter for examination of ears and hearing with other abnormal findings: Secondary | ICD-10-CM | POA: Diagnosis not present

## 2017-08-13 DIAGNOSIS — Z131 Encounter for screening for diabetes mellitus: Secondary | ICD-10-CM | POA: Diagnosis not present

## 2017-08-13 DIAGNOSIS — Z136 Encounter for screening for cardiovascular disorders: Secondary | ICD-10-CM | POA: Diagnosis not present

## 2017-08-13 DIAGNOSIS — Z113 Encounter for screening for infections with a predominantly sexual mode of transmission: Secondary | ICD-10-CM | POA: Diagnosis not present

## 2017-08-13 DIAGNOSIS — D5700 Hb-SS disease with crisis, unspecified: Secondary | ICD-10-CM | POA: Diagnosis not present

## 2017-08-13 DIAGNOSIS — H538 Other visual disturbances: Secondary | ICD-10-CM | POA: Diagnosis not present

## 2017-08-13 DIAGNOSIS — Z720 Tobacco use: Secondary | ICD-10-CM | POA: Diagnosis not present

## 2017-08-27 DIAGNOSIS — E559 Vitamin D deficiency, unspecified: Secondary | ICD-10-CM | POA: Diagnosis not present

## 2017-08-27 DIAGNOSIS — D5700 Hb-SS disease with crisis, unspecified: Secondary | ICD-10-CM | POA: Diagnosis not present

## 2017-08-27 DIAGNOSIS — R062 Wheezing: Secondary | ICD-10-CM | POA: Diagnosis not present

## 2017-08-27 DIAGNOSIS — Z720 Tobacco use: Secondary | ICD-10-CM | POA: Diagnosis not present

## 2017-08-27 DIAGNOSIS — D649 Anemia, unspecified: Secondary | ICD-10-CM | POA: Diagnosis not present

## 2017-08-27 DIAGNOSIS — I517 Cardiomegaly: Secondary | ICD-10-CM | POA: Diagnosis not present

## 2017-09-05 DIAGNOSIS — D5700 Hb-SS disease with crisis, unspecified: Secondary | ICD-10-CM | POA: Diagnosis not present

## 2017-09-05 DIAGNOSIS — D649 Anemia, unspecified: Secondary | ICD-10-CM | POA: Diagnosis not present

## 2017-09-05 DIAGNOSIS — E559 Vitamin D deficiency, unspecified: Secondary | ICD-10-CM | POA: Diagnosis not present

## 2017-09-05 DIAGNOSIS — I517 Cardiomegaly: Secondary | ICD-10-CM | POA: Diagnosis not present

## 2017-09-05 DIAGNOSIS — D571 Sickle-cell disease without crisis: Secondary | ICD-10-CM | POA: Diagnosis not present

## 2017-09-05 DIAGNOSIS — Z720 Tobacco use: Secondary | ICD-10-CM | POA: Diagnosis not present

## 2017-09-26 DIAGNOSIS — M545 Low back pain: Secondary | ICD-10-CM | POA: Diagnosis not present

## 2017-09-26 DIAGNOSIS — D5700 Hb-SS disease with crisis, unspecified: Secondary | ICD-10-CM | POA: Diagnosis not present

## 2017-09-26 DIAGNOSIS — D571 Sickle-cell disease without crisis: Secondary | ICD-10-CM | POA: Diagnosis not present

## 2017-09-26 DIAGNOSIS — E559 Vitamin D deficiency, unspecified: Secondary | ICD-10-CM | POA: Diagnosis not present

## 2017-09-26 DIAGNOSIS — I517 Cardiomegaly: Secondary | ICD-10-CM | POA: Diagnosis not present

## 2017-09-26 DIAGNOSIS — D649 Anemia, unspecified: Secondary | ICD-10-CM | POA: Diagnosis not present

## 2017-09-26 DIAGNOSIS — Z720 Tobacco use: Secondary | ICD-10-CM | POA: Diagnosis not present

## 2017-10-04 DIAGNOSIS — R0609 Other forms of dyspnea: Secondary | ICD-10-CM | POA: Diagnosis not present

## 2017-10-04 DIAGNOSIS — R9431 Abnormal electrocardiogram [ECG] [EKG]: Secondary | ICD-10-CM | POA: Diagnosis not present

## 2017-10-04 DIAGNOSIS — I517 Cardiomegaly: Secondary | ICD-10-CM | POA: Diagnosis not present

## 2017-10-04 DIAGNOSIS — I519 Heart disease, unspecified: Secondary | ICD-10-CM | POA: Diagnosis not present

## 2017-10-29 DIAGNOSIS — M25561 Pain in right knee: Secondary | ICD-10-CM | POA: Diagnosis not present

## 2017-10-29 DIAGNOSIS — M25572 Pain in left ankle and joints of left foot: Secondary | ICD-10-CM | POA: Diagnosis not present

## 2017-10-29 DIAGNOSIS — G894 Chronic pain syndrome: Secondary | ICD-10-CM | POA: Diagnosis not present

## 2017-10-29 DIAGNOSIS — D5720 Sickle-cell/Hb-C disease without crisis: Secondary | ICD-10-CM | POA: Diagnosis not present

## 2017-12-09 DIAGNOSIS — Z283 Underimmunization status: Secondary | ICD-10-CM | POA: Diagnosis not present

## 2017-12-09 DIAGNOSIS — I272 Pulmonary hypertension, unspecified: Secondary | ICD-10-CM | POA: Diagnosis not present

## 2017-12-09 DIAGNOSIS — E559 Vitamin D deficiency, unspecified: Secondary | ICD-10-CM | POA: Diagnosis not present

## 2017-12-09 DIAGNOSIS — D571 Sickle-cell disease without crisis: Secondary | ICD-10-CM | POA: Diagnosis not present

## 2017-12-09 DIAGNOSIS — G8929 Other chronic pain: Secondary | ICD-10-CM | POA: Diagnosis not present

## 2018-04-07 ENCOUNTER — Other Ambulatory Visit: Payer: Self-pay

## 2018-04-07 ENCOUNTER — Ambulatory Visit (INDEPENDENT_AMBULATORY_CARE_PROVIDER_SITE_OTHER): Payer: Medicare Other | Admitting: Internal Medicine

## 2018-04-07 ENCOUNTER — Encounter: Payer: Self-pay | Admitting: Internal Medicine

## 2018-04-07 DIAGNOSIS — J302 Other seasonal allergic rhinitis: Secondary | ICD-10-CM | POA: Diagnosis present

## 2018-04-07 MED ORDER — FLUTICASONE PROPIONATE 50 MCG/ACT NA SUSP: 2.0000 | Freq: Every day | NASAL | 0 refills | Status: DC

## 2018-04-07 MED ORDER — CETIRIZINE HCL 10 MG PO TABS: 10.0000 mg | ORAL_TABLET | Freq: Every day | ORAL | 1 refills | Status: DC

## 2018-04-07 NOTE — Progress Notes (Signed)
   [facility] Phone: [phone]   Date of Visit: 04/07/2018   HPI:  Rhinorrhea:  - rhinorrhea and sinus pressure for the past few days - slight cough with clear mucus - no fevers or shortness of breath or sore throat or ear pain  - has been having itchy/watery eyes - does have seasonal allergies which he thinks this is due to.  - does not smoke cigarettes but vapes   - has tried Claritin and Afrin for one day without much help   ROS: See HPI.  PMFSH:  PMH: Sickle Cell Anemia Seasonal Allergies  PHYSICAL EXAM: BP (!) 110/58   Pulse 60   Temp 98.3 F (36.8 C) (Oral)   Wt 162 lb (73.5 kg)   SpO2 90%   BMI 20.25 kg/m  GEN: NAD HEENT: Atraumatic, normocephalic, neck supple without lymphadenopathy, EOMI, sclera clear, oropharynx normal. No sinus pain to palpation or percussion. Clear nasal secretions bilaterally  CV: RRR, systolic murmur PULM: CTAB, normal effort SKIN: No rash or cyanosis; warm and well-perfused EXTR: No lower extremity edema or calf tenderness PSYCH: Mood and affect euthymic, normal rate and volume of speech NEURO: Awake, alert, no focal deficits grossly, normal speech  ASSESSMENT/PLAN:  Seasonal allergies Symptoms most consistent with allergic rhinitis. No signs of pneumonia. Zyrtec 10mg  daily and Flonase. Return precautions discussed.   Scott HolterKanishka G [name], MD PGY 3 [facility]

## 2018-04-07 NOTE — Assessment & Plan Note (Addendum)
Symptoms most consistent with allergic rhinitis. No signs of pneumonia. Zyrtec 10mg  daily and Flonase. Return precautions discussed.

## 2018-04-07 NOTE — Patient Instructions (Addendum)
Lets start Zyrtec and Flonase for your allergies.  Follow up if symptoms do not improve.

## 2018-07-10 DIAGNOSIS — Z79899 Other long term (current) drug therapy: Secondary | ICD-10-CM | POA: Diagnosis not present

## 2018-07-10 DIAGNOSIS — M7918 Myalgia, other site: Secondary | ICD-10-CM | POA: Diagnosis not present

## 2018-07-10 DIAGNOSIS — D571 Sickle-cell disease without crisis: Secondary | ICD-10-CM | POA: Diagnosis not present

## 2018-07-10 DIAGNOSIS — E559 Vitamin D deficiency, unspecified: Secondary | ICD-10-CM | POA: Diagnosis not present

## 2018-07-10 DIAGNOSIS — G8929 Other chronic pain: Secondary | ICD-10-CM | POA: Diagnosis not present

## 2018-12-11 DIAGNOSIS — G8929 Other chronic pain: Secondary | ICD-10-CM | POA: Diagnosis not present

## 2018-12-11 DIAGNOSIS — Z79899 Other long term (current) drug therapy: Secondary | ICD-10-CM | POA: Diagnosis not present

## 2018-12-11 DIAGNOSIS — M791 Myalgia, unspecified site: Secondary | ICD-10-CM | POA: Diagnosis not present

## 2018-12-11 DIAGNOSIS — E559 Vitamin D deficiency, unspecified: Secondary | ICD-10-CM | POA: Diagnosis not present

## 2018-12-11 DIAGNOSIS — D571 Sickle-cell disease without crisis: Secondary | ICD-10-CM | POA: Diagnosis not present

## 2019-03-12 DIAGNOSIS — E559 Vitamin D deficiency, unspecified: Secondary | ICD-10-CM | POA: Diagnosis not present

## 2019-03-12 DIAGNOSIS — D571 Sickle-cell disease without crisis: Secondary | ICD-10-CM | POA: Diagnosis not present

## 2019-03-12 DIAGNOSIS — G8929 Other chronic pain: Secondary | ICD-10-CM | POA: Diagnosis not present

## 2019-06-11 DIAGNOSIS — D571 Sickle-cell disease without crisis: Secondary | ICD-10-CM | POA: Diagnosis not present

## 2019-06-11 DIAGNOSIS — E559 Vitamin D deficiency, unspecified: Secondary | ICD-10-CM | POA: Diagnosis not present

## 2019-06-11 DIAGNOSIS — G8929 Other chronic pain: Secondary | ICD-10-CM | POA: Diagnosis not present

## 2020-03-10 DIAGNOSIS — E559 Vitamin D deficiency, unspecified: Secondary | ICD-10-CM | POA: Diagnosis not present

## 2020-03-10 DIAGNOSIS — D571 Sickle-cell disease without crisis: Secondary | ICD-10-CM | POA: Diagnosis not present

## 2020-03-10 DIAGNOSIS — G8929 Other chronic pain: Secondary | ICD-10-CM | POA: Diagnosis not present

## 2020-06-02 ENCOUNTER — Emergency Department (HOSPITAL_COMMUNITY)
Admission: EM | Admit: 2020-06-02 | Discharge: 2020-06-03 | Disposition: A | Discharge status: Home / Self Care | Payer: Medicare Other | Attending: Emergency Medicine | Admitting: Emergency Medicine

## 2020-06-02 ENCOUNTER — Encounter (HOSPITAL_COMMUNITY): Payer: Self-pay

## 2020-06-02 ENCOUNTER — Other Ambulatory Visit: Payer: Self-pay

## 2020-06-02 DIAGNOSIS — M545 Low back pain: Secondary | ICD-10-CM | POA: Insufficient documentation

## 2020-06-02 DIAGNOSIS — D5700 Hb-SS disease with crisis, unspecified: Secondary | ICD-10-CM | POA: Diagnosis not present

## 2020-06-02 DIAGNOSIS — F17210 Nicotine dependence, cigarettes, uncomplicated: Secondary | ICD-10-CM | POA: Insufficient documentation

## 2020-06-02 DIAGNOSIS — R531 Weakness: Secondary | ICD-10-CM | POA: Diagnosis present

## 2020-06-02 LAB — CBC WITH DIFFERENTIAL/PLATELET
Abs Immature Granulocytes: 0.03 10*3/uL (ref 0.00–0.07)
Basophils Absolute: 0 10*3/uL (ref 0.0–0.1)
Basophils Relative: 0 %
Eosinophils Absolute: 0.2 10*3/uL (ref 0.0–0.5)
Eosinophils Relative: 3 %
HCT: 21.7 % — ABNORMAL LOW (ref 39.0–52.0)
Hemoglobin: 7.3 g/dL — ABNORMAL LOW (ref 13.0–17.0)
Immature Granulocytes: 0 %
Lymphocytes Relative: 33 %
Lymphs Abs: 2.3 10*3/uL (ref 0.7–4.0)
MCH: 30.9 pg (ref 26.0–34.0)
MCHC: 33.6 g/dL (ref 30.0–36.0)
MCV: 91.9 fL (ref 80.0–100.0)
Monocytes Absolute: 1.2 10*3/uL — ABNORMAL HIGH (ref 0.1–1.0)
Monocytes Relative: 17 %
Neutro Abs: 3.3 10*3/uL (ref 1.7–7.7)
Neutrophils Relative %: 47 %
Platelets: 180 10*3/uL (ref 150–400)
RBC: 2.36 MIL/uL — ABNORMAL LOW (ref 4.22–5.81)
RDW: 24.3 % — ABNORMAL HIGH (ref 11.5–15.5)
WBC: 7.1 10*3/uL (ref 4.0–10.5)
nRBC: 5.2 % — ABNORMAL HIGH (ref 0.0–0.2)

## 2020-06-02 LAB — COMPREHENSIVE METABOLIC PANEL
ALT: 21 U/L (ref 0–44)
AST: 34 U/L (ref 15–41)
Albumin: 3.7 g/dL (ref 3.5–5.0)
Alkaline Phosphatase: 254 U/L — ABNORMAL HIGH (ref 38–126)
Anion gap: 9 (ref 5–15)
BUN: 20 mg/dL (ref 6–20)
CO2: 23 mmol/L (ref 22–32)
Calcium: 9.1 mg/dL (ref 8.9–10.3)
Chloride: 107 mmol/L (ref 98–111)
Creatinine, Ser: 1.53 mg/dL — ABNORMAL HIGH (ref 0.61–1.24)
GFR calc Af Amer: 59 mL/min — ABNORMAL LOW (ref >60)
GFR calc non Af Amer: 51 mL/min — ABNORMAL LOW (ref >60)
Glucose, Bld: 92 mg/dL (ref 70–99)
Potassium: 5 mmol/L (ref 3.5–5.1)
Sodium: 139 mmol/L (ref 135–145)
Total Bilirubin: 1.7 mg/dL — ABNORMAL HIGH (ref 0.3–1.2)
Total Protein: 6.7 g/dL (ref 6.5–8.1)

## 2020-06-02 LAB — RETICULOCYTES
Immature Retic Fract: 41.9 % — ABNORMAL HIGH (ref 2.3–15.9)
RBC.: 2.5 MIL/uL — ABNORMAL LOW (ref 4.22–5.81)
Retic Count, Absolute: 208.5 10*3/uL — ABNORMAL HIGH (ref 19.0–186.0)
Retic Ct Pct: 8.3 % — ABNORMAL HIGH (ref 0.4–3.1)

## 2020-06-02 NOTE — ED Triage Notes (Signed)
Pt arrives to ED w/ c/o generalized weakness and back pain. Pt states he has hx of sickle cell. Pt denies chest pain, sob. NAD.

## 2020-06-02 NOTE — ED Notes (Addendum)
Pt was called multiple times no answer

## 2020-06-03 DIAGNOSIS — D5700 Hb-SS disease with crisis, unspecified: Secondary | ICD-10-CM | POA: Diagnosis not present

## 2020-06-03 MED ORDER — DIPHENHYDRAMINE HCL 50 MG/ML IJ SOLN
25.0000 mg | Freq: Once | INTRAMUSCULAR | Status: AC
  Administered 2020-06-03: 25 mg via INTRAVENOUS
  Filled 2020-06-03: qty 1

## 2020-06-03 MED ORDER — MORPHINE SULFATE (PF) 4 MG/ML IV SOLN
6.0000 mg | INTRAVENOUS | Status: AC
  Filled 2020-06-03: qty 2

## 2020-06-03 MED ORDER — ONDANSETRON HCL 4 MG/2ML IJ SOLN
4.0000 mg | INTRAMUSCULAR | Status: DC | PRN
  Administered 2020-06-03: 4 mg via INTRAVENOUS
  Filled 2020-06-03: qty 2

## 2020-06-03 MED ORDER — LACTATED RINGERS IV BOLUS
2000.0000 mL | Freq: Once | INTRAVENOUS | Status: AC
  Administered 2020-06-03: 2000 mL via INTRAVENOUS

## 2020-06-03 MED ORDER — MORPHINE SULFATE (PF) 4 MG/ML IV SOLN
8.0000 mg | INTRAVENOUS | Status: AC
  Administered 2020-06-03: 8 mg via INTRAVENOUS

## 2020-06-03 NOTE — ED Notes (Signed)
Pt's O2 sat measured 96 before, during, and after ambulation. Pt c/o dizziness while ambulating but tolerated the exercise well. Findings reported to EDP

## 2020-06-03 NOTE — Discharge Instructions (Addendum)
Please schedule an appointment to follow-up with your hematologist in the next few days for continued medical management of your sickle cell disease. We have given you pain medication in the ED, so you will be unable to operate a vehicle. If you were to develop new or worsening symptoms, please return to the ED for evaluation.

## 2020-06-03 NOTE — ED Provider Notes (Signed)
I saw and evaluated the patient, reviewed the resident's note and I agree with the findings and plan.  EKG:    54 year old male presents complaining of sickle cell crisis. Patient has been out of his medications. Pain is localized in his arms and legs. Denies any fever cough congestion. Labs reviewed and does have some evidence of dehydration. Will IV hydrate here and treat with opiates and likely discharge.   Lorre Nick, MD 06/03/20 1007

## 2020-06-03 NOTE — ED Provider Notes (Signed)
[facility] EMERGENCY DEPARTMENT Provider Note   CSN: [id number] Arrival date & time: 06/02/20  1554   History Chief Complaint  Patient presents with  . Weakness   [name] is a 54 year old man with past medical history of sickle cell anemia who presents to [facility] for evaluation of weakness. He states that approximately one week ago, he was kicked out of the hotel where he was living with his roommate. Since then, he has been living in his car and spending his days outside in the heat. He has not been eating or drinking much and has been out of all of his medications for pain control of his sickle cell anemia. Approximately two days ago, he began to develop weakness in his bilateral lower extremities and pain in his shoulders, elbows and back. The weakness and pain has been constant and progressive so he presented to the ED yesterday for evaluation and management. He states that sitting in the waiting room worsened his pain, and he has found nothing to alleviate his pain. Otherwise, he denies any new symptoms including chest pain, cough, shortness of breath, fevers, chills, nausea, vomiting, or diarrhea.      Past Medical History:  Diagnosis Date  . Heart murmur   . Pneumonia    ?ACS requring intubation  . Seasonal allergies    wool & grass  . Sickle cell anemia [facility])    Patient Active Problem List   Diagnosis Date Noted  . Sickle cell pain crisis (HCC) 04/11/2015  . Respiratory infection 08/13/2012  . Healthcare maintenance 06/04/2012  . Cervical strain, acute 12/09/2011  . Sickle cell anemia (HCC) 11/29/2011  . Seasonal allergies 11/29/2011   Past Surgical History:  Procedure Laterality Date  . ROOT CANAL      Family History  Problem Relation Age of Onset  . Alcohol abuse Mother   . Sickle cell trait Mother   . Hypertension Mother   . Alcohol abuse Father   . Sickle cell trait Father   . Early death Father        due to alcoholism  .  Hypertension Father   . Hypertension Sister   . Hypertension Brother   . Hypertension Maternal Grandmother   . Hypertension Maternal Grandfather   . Hypertension Paternal Grandmother   . Hypertension Paternal Grandfather    Social History   Tobacco Use  . Smoking status: Current Every Day Smoker    Packs/day: 0.50    Types: Cigars  . Smokeless tobacco: Never Used  Substance Use Topics  . Alcohol use: Yes    Comment: occasional but indicated etoh abuse on health hx questionaire  . Drug use: No   Home Medications Prior to Admission medications   Medication Sig Start Date End Date Taking? Authorizing Provider  folic acid (FOLVITE) 1 MG tablet Take 1 tablet (1 mg total) by mouth daily. 07/19/14  Yes Kuneff, Renee A, DO  oxyCODONE-acetaminophen (PERCOCET) 10-325 MG tablet Take 1 tablet by mouth See admin instructions. Take 1 tablet by mouth every 4-6 hours as needed for pain 05/26/20  Yes [provider]  cetirizine (ZYRTEC) 10 MG tablet Take 1 tablet (10 mg total) by mouth daily. Patient not taking: Reported on 06/03/2020 04/07/18   Palma Holter, MD  fluticasone [facility]) 50 MCG/ACT nasal spray Place 2 sprays into both nostrils daily. Patient not taking: Reported on 06/03/2020 04/07/18   Palma Holter, MD  HYDROcodone-acetaminophen (NORCO/VICODIN) 5-325 MG tablet Take 1 tablet by mouth  every 4 (four) hours as needed. Patient not taking: Reported on 06/03/2020 11/04/16   Rolland Porter, MD  ibuprofen (ADVIL,MOTRIN) 800 MG tablet Take 1 tablet (800 mg total) by mouth 3 (three) times daily. Patient not taking: Reported on 06/03/2020 11/04/16   Rolland Porter, MD  morphine (MSIR) 15 MG tablet Take 1 tablet (15 mg total) by mouth every 4 (four) hours as needed for pain. Patient not taking: Reported on 11/04/2016 10/28/12   Andrena Mews, DO  oxyCODONE-acetaminophen (ROXICET) 5-325 MG per tablet Take 1 tablet by mouth every 8 (eight) hours as needed for severe pain. Patient not  taking: Reported on 06/03/2020 04/11/15   Felix Pacini A, DO   Allergies    Tramadol  Review of Systems   Review of Systems  Constitutional: Negative for chills and fever.  HENT: Negative for ear pain and sore throat.   Eyes: Negative for pain and visual disturbance.  Respiratory: Negative for cough and shortness of breath.   Cardiovascular: Negative for chest pain and palpitations.  Gastrointestinal: Negative for abdominal pain, diarrhea, nausea and vomiting.  Genitourinary: Negative for dysuria and hematuria.  Musculoskeletal: Positive for arthralgias and back pain.  Skin: Negative for color change and rash.  Neurological: Negative for seizures and syncope.  All other systems reviewed and are negative.  Physical Exam Updated Vital Signs BP 114/67   Pulse 72   Temp 98.1 F (36.7 C) (Oral)   Resp 18   Ht 6\' 3"  (1.905 m)   Wt 67.3 kg   SpO2 95%   BMI 18.54 kg/m   Physical Exam Vitals and nursing note reviewed.  Constitutional:      General: He is not in acute distress.    Appearance: Normal appearance. He is well-developed. He is not ill-appearing.  HENT:     Head: Normocephalic and atraumatic.     Mouth/Throat:     Mouth: Mucous membranes are dry.     Pharynx: Oropharynx is clear.  Eyes:     Extraocular Movements: Extraocular movements intact.     Conjunctiva/sclera: Conjunctivae normal.  Cardiovascular:     Rate and Rhythm: Normal rate and regular rhythm.     Heart sounds: No murmur heard.   Pulmonary:     Effort: Pulmonary effort is normal. No respiratory distress.     Breath sounds: Normal breath sounds.  Abdominal:     General: Abdomen is flat. Bowel sounds are normal.     Palpations: Abdomen is soft.     Tenderness: There is no abdominal tenderness.  Musculoskeletal:        General: No swelling or signs of injury. Normal range of motion.     Cervical back: Normal range of motion and neck supple.     Right lower leg: No edema.     Left lower leg: No  edema.  Skin:    General: Skin is warm and dry.  Neurological:     General: No focal deficit present.     Mental Status: He is alert and oriented to person, place, and time.     Cranial Nerves: No cranial nerve deficit.     Sensory: No sensory deficit.     Motor: No weakness.  Psychiatric:        Mood and Affect: Mood normal.        Behavior: Behavior normal.        Thought Content: Thought content normal.        Judgment: Judgment normal.    ED  Results / Procedures / Treatments   Labs (all labs ordered are listed, but only abnormal results are displayed) Labs Reviewed  COMPREHENSIVE METABOLIC PANEL - Abnormal; Notable for the following components:      Result Value   Creatinine, Ser 1.53 (*)    Alkaline Phosphatase 254 (*)    Total Bilirubin 1.7 (*)    GFR calc non Af Amer 51 (*)    GFR calc Af Amer 59 (*)    All other components within normal limits  CBC WITH DIFFERENTIAL/PLATELET - Abnormal; Notable for the following components:   RBC 2.36 (*)    Hemoglobin 7.3 (*)    HCT 21.7 (*)    RDW 24.3 (*)    nRBC 5.2 (*)    Monocytes Absolute 1.2 (*)    All other components within normal limits  RETICULOCYTES - Abnormal; Notable for the following components:   Retic Ct Pct 8.3 (*)    RBC. 2.50 (*)    Retic Count, Absolute 208.5 (*)    Immature Retic Fract 41.9 (*)    All other components within normal limits   EKG None  Radiology No results found.  Procedures Procedures (including critical care time)  Medications Ordered in ED Medications  morphine 4 MG/ML injection 6 mg (6 mg Intravenous Not Given 06/03/20 1122)  ondansetron (ZOFRAN) injection 4 mg (4 mg Intravenous Given 06/03/20 1128)  morphine 4 MG/ML injection 8 mg (8 mg Intravenous Given 06/03/20 1133)  diphenhydrAMINE (BENADRYL) injection 25 mg (25 mg Intravenous Given 06/03/20 1129)  lactated ringers bolus 2,000 mL (0 mLs Intravenous Stopped 06/03/20 1355)    ED Course  I have reviewed the triage vital signs  and the nursing notes.  Pertinent labs & imaging results that were available during my care of the patient were reviewed by me and considered in my medical decision making (see chart for details).    MDM Rules/Calculators/A&P                          [name] is a 54 year old man with past medical history significant for sickle cell disease who presents for evaluation of generalized weakness and pain. He is hemodynamically stable and afebrile. CMP reveals creatinine elevated to 1.53 from baseline of 0.8. Follows with Hematology at [facility] which reports his baseline Hgb 8.5, WBC 7,500, reticulocyte count 8%. Current labs Hgb 7.3, WBC 7,100, reticulocyte count 8.3. Patient's presentation likely secondary to sickle cell crisis. Additionally, patient has AKI in the setting of decreased PO intake for one week. Additionally, he has been off of his chronic daily pain medication regimen of for one week. We will hydrate with 2L of LR, control pain with morphine, IV benadryl, and IV Zofran. We will continue to monitor for improvement of his symptoms.   Upon re-evaluation, he is hemodynamically stable and reports that he feels better. He was ambulated in the hall with maintenance of his oxygen saturation. He is stable for discharge with follow-up with his hematologist, Dr. Pamalee Leyden, for further evaluation and management of his chronic sickle cell disease. He understands and agrees with this plan and has no further questions or concerns.  Final Clinical Impression(s) / ED Diagnoses Final diagnoses:  Sickle cell crisis [facility])   Rx / DC Orders ED Discharge Orders    None       Jasmine December, MD 06/03/20 1439    Lorre Nick, MD 06/04/20 1415

## 2020-06-03 NOTE — ED Notes (Signed)
Patient verbalizes understanding of discharge instructions. Opportunity for questioning and answers were provided. Armband removed by staff, pt discharged from ED.  

## 2020-06-03 NOTE — ED Notes (Signed)
Pt given 8mg  of morphine once, not 6 mg (charted not given as order had timed out). Pyxis shows that I have waste on 2mg . I do not, as 8 mg given per order and charted. Reviewed with , pharmD and documented as discussed.

## 2020-06-03 NOTE — ED Notes (Signed)
Pt will be going home with a friend

## 2020-06-03 NOTE — ED Notes (Signed)
Pt was called X2 never answered

## 2020-06-03 NOTE — ED Notes (Signed)
Called pt x3 for vitals, no response. 

## 2020-06-13 DIAGNOSIS — E559 Vitamin D deficiency, unspecified: Secondary | ICD-10-CM | POA: Diagnosis not present

## 2020-06-13 DIAGNOSIS — Z79899 Other long term (current) drug therapy: Secondary | ICD-10-CM | POA: Diagnosis not present

## 2020-06-13 DIAGNOSIS — G8929 Other chronic pain: Secondary | ICD-10-CM | POA: Diagnosis not present

## 2020-06-13 DIAGNOSIS — D571 Sickle-cell disease without crisis: Secondary | ICD-10-CM | POA: Diagnosis not present

## 2020-07-14 DIAGNOSIS — Z79899 Other long term (current) drug therapy: Secondary | ICD-10-CM | POA: Diagnosis not present

## 2020-07-14 DIAGNOSIS — F17210 Nicotine dependence, cigarettes, uncomplicated: Secondary | ICD-10-CM | POA: Diagnosis not present

## 2020-07-14 DIAGNOSIS — R5383 Other fatigue: Secondary | ICD-10-CM | POA: Diagnosis not present

## 2020-07-14 DIAGNOSIS — Z13220 Encounter for screening for lipoid disorders: Secondary | ICD-10-CM | POA: Diagnosis not present

## 2020-07-14 DIAGNOSIS — E559 Vitamin D deficiency, unspecified: Secondary | ICD-10-CM | POA: Diagnosis not present

## 2020-07-14 DIAGNOSIS — Z1159 Encounter for screening for other viral diseases: Secondary | ICD-10-CM | POA: Diagnosis not present

## 2020-07-14 DIAGNOSIS — Z125 Encounter for screening for malignant neoplasm of prostate: Secondary | ICD-10-CM | POA: Diagnosis not present

## 2020-07-14 DIAGNOSIS — D571 Sickle-cell disease without crisis: Secondary | ICD-10-CM | POA: Diagnosis not present

## 2020-07-14 DIAGNOSIS — Z131 Encounter for screening for diabetes mellitus: Secondary | ICD-10-CM | POA: Diagnosis not present

## 2020-09-04 ENCOUNTER — Encounter (HOSPITAL_COMMUNITY): Payer: Self-pay

## 2020-09-04 ENCOUNTER — Inpatient Hospital Stay (HOSPITAL_COMMUNITY)
Admission: EM | Admit: 2020-09-04 | Discharge: 2020-09-13 | DRG: 812 | Disposition: A | Discharge status: Home / Self Care | Payer: Medicare Other | Attending: Internal Medicine | Admitting: Internal Medicine

## 2020-09-04 ENCOUNTER — Other Ambulatory Visit: Payer: Self-pay

## 2020-09-04 DIAGNOSIS — R079 Chest pain, unspecified: Secondary | ICD-10-CM | POA: Diagnosis not present

## 2020-09-04 DIAGNOSIS — F32A Depression, unspecified: Secondary | ICD-10-CM | POA: Diagnosis present

## 2020-09-04 DIAGNOSIS — Z79899 Other long term (current) drug therapy: Secondary | ICD-10-CM | POA: Diagnosis not present

## 2020-09-04 DIAGNOSIS — D72828 Other elevated white blood cell count: Secondary | ICD-10-CM | POA: Diagnosis present

## 2020-09-04 DIAGNOSIS — R0602 Shortness of breath: Secondary | ICD-10-CM

## 2020-09-04 DIAGNOSIS — F17210 Nicotine dependence, cigarettes, uncomplicated: Secondary | ICD-10-CM | POA: Diagnosis present

## 2020-09-04 DIAGNOSIS — Z5900 Homelessness unspecified: Secondary | ICD-10-CM | POA: Diagnosis not present

## 2020-09-04 DIAGNOSIS — R45851 Suicidal ideations: Secondary | ICD-10-CM

## 2020-09-04 DIAGNOSIS — I517 Cardiomegaly: Secondary | ICD-10-CM | POA: Diagnosis not present

## 2020-09-04 DIAGNOSIS — Z720 Tobacco use: Secondary | ICD-10-CM | POA: Diagnosis not present

## 2020-09-04 DIAGNOSIS — J90 Pleural effusion, not elsewhere classified: Secondary | ICD-10-CM | POA: Diagnosis not present

## 2020-09-04 DIAGNOSIS — D5700 Hb-SS disease with crisis, unspecified: Secondary | ICD-10-CM | POA: Diagnosis not present

## 2020-09-04 DIAGNOSIS — G894 Chronic pain syndrome: Secondary | ICD-10-CM | POA: Diagnosis present

## 2020-09-04 DIAGNOSIS — D649 Anemia, unspecified: Secondary | ICD-10-CM

## 2020-09-04 DIAGNOSIS — K5900 Constipation, unspecified: Secondary | ICD-10-CM | POA: Diagnosis not present

## 2020-09-04 DIAGNOSIS — D638 Anemia in other chronic diseases classified elsewhere: Secondary | ICD-10-CM | POA: Diagnosis present

## 2020-09-04 DIAGNOSIS — Z20822 Contact with and (suspected) exposure to covid-19: Secondary | ICD-10-CM | POA: Diagnosis present

## 2020-09-04 DIAGNOSIS — J9811 Atelectasis: Secondary | ICD-10-CM | POA: Diagnosis not present

## 2020-09-04 DIAGNOSIS — I10 Essential (primary) hypertension: Secondary | ICD-10-CM | POA: Diagnosis present

## 2020-09-04 DIAGNOSIS — J811 Chronic pulmonary edema: Secondary | ICD-10-CM | POA: Diagnosis not present

## 2020-09-04 DIAGNOSIS — D6959 Other secondary thrombocytopenia: Secondary | ICD-10-CM | POA: Diagnosis present

## 2020-09-04 DIAGNOSIS — Z832 Family history of diseases of the blood and blood-forming organs and certain disorders involving the immune mechanism: Secondary | ICD-10-CM

## 2020-09-04 DIAGNOSIS — R0789 Other chest pain: Secondary | ICD-10-CM | POA: Diagnosis not present

## 2020-09-04 DIAGNOSIS — F17290 Nicotine dependence, other tobacco product, uncomplicated: Secondary | ICD-10-CM | POA: Diagnosis present

## 2020-09-04 DIAGNOSIS — R1084 Generalized abdominal pain: Secondary | ICD-10-CM | POA: Diagnosis not present

## 2020-09-04 DIAGNOSIS — D571 Sickle-cell disease without crisis: Secondary | ICD-10-CM

## 2020-09-04 DIAGNOSIS — G4489 Other headache syndrome: Secondary | ICD-10-CM | POA: Diagnosis not present

## 2020-09-04 HISTORY — DX: Anemia, unspecified: D64.9

## 2020-09-04 HISTORY — DX: Depression, unspecified: F32.A

## 2020-09-04 LAB — CBC WITH DIFFERENTIAL/PLATELET
Abs Immature Granulocytes: 0.03 10*3/uL (ref 0.00–0.07)
Basophils Absolute: 0 10*3/uL (ref 0.0–0.1)
Basophils Relative: 1 %
Eosinophils Absolute: 0.2 10*3/uL (ref 0.0–0.5)
Eosinophils Relative: 3 %
HCT: 19 % — ABNORMAL LOW (ref 39.0–52.0)
Hemoglobin: 6.5 g/dL — CL (ref 13.0–17.0)
Immature Granulocytes: 1 %
Lymphocytes Relative: 44 %
Lymphs Abs: 2.9 10*3/uL (ref 0.7–4.0)
MCH: 32 pg (ref 26.0–34.0)
MCHC: 34.2 g/dL (ref 30.0–36.0)
MCV: 93.6 fL (ref 80.0–100.0)
Monocytes Absolute: 0.8 10*3/uL (ref 0.1–1.0)
Monocytes Relative: 12 %
Neutro Abs: 2.5 10*3/uL (ref 1.7–7.7)
Neutrophils Relative %: 39 %
Platelets: 186 10*3/uL (ref 150–400)
RBC: 2.03 MIL/uL — ABNORMAL LOW (ref 4.22–5.81)
RDW: 28.1 % — ABNORMAL HIGH (ref 11.5–15.5)
WBC: 6.5 10*3/uL (ref 4.0–10.5)
nRBC: 24 % — ABNORMAL HIGH (ref 0.0–0.2)

## 2020-09-04 LAB — BASIC METABOLIC PANEL
Anion gap: 6 (ref 5–15)
BUN: 13 mg/dL (ref 6–20)
CO2: 25 mmol/L (ref 22–32)
Calcium: 9.2 mg/dL (ref 8.9–10.3)
Chloride: 109 mmol/L (ref 98–111)
Creatinine, Ser: 1.24 mg/dL (ref 0.61–1.24)
GFR, Estimated: >60 mL/min (ref >60)
Glucose, Bld: 80 mg/dL (ref 70–99)
Potassium: 4.5 mmol/L (ref 3.5–5.1)
Sodium: 140 mmol/L (ref 135–145)

## 2020-09-04 LAB — PREPARE RBC (CROSSMATCH)

## 2020-09-04 LAB — RESPIRATORY PANEL BY RT PCR (FLU A&B, COVID)
Influenza A by PCR: NEGATIVE
Influenza B by PCR: NEGATIVE
SARS Coronavirus 2 by RT PCR: NEGATIVE

## 2020-09-04 LAB — RETICULOCYTES
Immature Retic Fract: 52.9 % — ABNORMAL HIGH (ref 2.3–15.9)
RBC.: 2.02 MIL/uL — ABNORMAL LOW (ref 4.22–5.81)
Retic Count, Absolute: 196.7 10*3/uL — ABNORMAL HIGH (ref 19.0–186.0)
Retic Ct Pct: 9.7 % — ABNORMAL HIGH (ref 0.4–3.1)

## 2020-09-04 MED ORDER — HYDROMORPHONE HCL 2 MG/ML IJ SOLN
2.0000 mg | INTRAMUSCULAR | Status: AC
  Administered 2020-09-04: 2 mg via INTRAVENOUS
  Filled 2020-09-04: qty 1

## 2020-09-04 MED ORDER — TRAZODONE HCL 50 MG PO TABS
50.0000 mg | ORAL_TABLET | Freq: Every evening | ORAL | Status: DC | PRN
  Administered 2020-09-08 – 2020-09-10 (×2): 50 mg via ORAL
  Filled 2020-09-04 (×2): qty 1

## 2020-09-04 MED ORDER — HYDROMORPHONE HCL 1 MG/ML IJ SOLN
1.0000 mg | INTRAMUSCULAR | Status: DC | PRN
  Administered 2020-09-05: 1 mg via INTRAVENOUS
  Filled 2020-09-04 (×2): qty 1

## 2020-09-04 MED ORDER — OXYCODONE-ACETAMINOPHEN 5-325 MG PO TABS
1.0000 | ORAL_TABLET | ORAL | Status: DC | PRN
  Administered 2020-09-08 – 2020-09-13 (×8): 1 via ORAL
  Filled 2020-09-04 (×8): qty 1

## 2020-09-04 MED ORDER — KETOROLAC TROMETHAMINE 15 MG/ML IJ SOLN
15.0000 mg | Freq: Three times a day (TID) | INTRAMUSCULAR | Status: DC | PRN
  Administered 2020-09-05: 15 mg via INTRAVENOUS
  Filled 2020-09-04: qty 1

## 2020-09-04 MED ORDER — SODIUM CHLORIDE 0.9% IV SOLUTION: Freq: Once | INTRAVENOUS | Status: AC

## 2020-09-04 MED ORDER — OXYCODONE-ACETAMINOPHEN 5-325 MG PO TABS
2.0000 | ORAL_TABLET | Freq: Once | ORAL | Status: AC
  Administered 2020-09-04: 2 via ORAL
  Filled 2020-09-04: qty 2

## 2020-09-04 MED ORDER — DIPHENHYDRAMINE HCL 25 MG PO CAPS
25.0000 mg | ORAL_CAPSULE | Freq: Once | ORAL | Status: AC
  Administered 2020-09-04: 25 mg via ORAL
  Filled 2020-09-04: qty 1

## 2020-09-04 MED ORDER — OXYCODONE HCL 5 MG PO TABS
5.0000 mg | ORAL_TABLET | ORAL | Status: DC | PRN
  Administered 2020-09-08 – 2020-09-13 (×8): 5 mg via ORAL
  Filled 2020-09-04 (×8): qty 1

## 2020-09-04 MED ORDER — ACETAMINOPHEN 325 MG PO TABS
650.0000 mg | ORAL_TABLET | Freq: Once | ORAL | Status: AC
  Administered 2020-09-04: 650 mg via ORAL
  Filled 2020-09-04: qty 2

## 2020-09-04 MED ORDER — NICOTINE 14 MG/24HR TD PT24
14.0000 mg | MEDICATED_PATCH | Freq: Every day | TRANSDERMAL | Status: DC
  Administered 2020-09-04 – 2020-09-07 (×4): 14 mg via TRANSDERMAL
  Filled 2020-09-04 (×9): qty 1

## 2020-09-04 MED ORDER — SODIUM CHLORIDE 0.9 % IV SOLN: INTRAVENOUS | Status: DC

## 2020-09-04 MED ORDER — ONDANSETRON HCL 4 MG/2ML IJ SOLN
4.0000 mg | Freq: Once | INTRAMUSCULAR | Status: AC
  Administered 2020-09-04: 4 mg via INTRAVENOUS
  Filled 2020-09-04: qty 2

## 2020-09-04 MED ORDER — OXYCODONE-ACETAMINOPHEN 10-325 MG PO TABS: 1.0000 | ORAL_TABLET | ORAL | Status: DC

## 2020-09-04 MED ORDER — SENNOSIDES-DOCUSATE SODIUM 8.6-50 MG PO TABS
1.0000 | ORAL_TABLET | Freq: Every evening | ORAL | Status: DC | PRN
  Administered 2020-09-08: 1 via ORAL
  Filled 2020-09-04: qty 1

## 2020-09-04 NOTE — ED Notes (Addendum)
Critical Value- Hemoglobin-6.5 Dykstra, EDP made aware.

## 2020-09-04 NOTE — ED Triage Notes (Signed)
[place] [facility] [place].    C/o SCC C/O extremity pain and joint pain  Pt reports SI or past month per ems and has a plan to jump off bridge.    BP-138/80 p-76 rr-16 95% RA 98.7   Pt homeless per ems

## 2020-09-04 NOTE — ED Provider Notes (Signed)
[facility]-EMERGENCY DEPT Provider Note   CSN: [id number] Arrival date & time: 09/04/20  1619     History Chief Complaint  Patient presents with  . Sickle Cell Pain Crisis    Scott Norton is a 54 y.o. male.  Presents to ER with concern for sickle cell pain.  Patient reports that he has been hurting in all of his extremities, back pain, joint pain, similar to prior pain crises, sharp, achy pains.  No nausea or vomiting, no fever.  No chest pain or difficulty breathing.  Has a secondary concern, patient reports that he has had some general suicidal thoughts over the past month due to his current homeless situation.  He reports that he does not have any active SI and no intentions to complete self-harm.  HPI     Past Medical History:  Diagnosis Date  . Heart murmur   . Pneumonia    ?ACS requring intubation  . Seasonal allergies    wool & grass  . Sickle cell anemia [facility])     Patient Active Problem List   Diagnosis Date Noted  . Sickle cell pain crisis (HCC) 04/11/2015  . Respiratory infection 08/13/2012  . Healthcare maintenance 06/04/2012  . Cervical strain, acute 12/09/2011  . Sickle cell anemia (HCC) 11/29/2011  . Seasonal allergies 11/29/2011    Past Surgical History:  Procedure Laterality Date  . ROOT CANAL         Family History  Problem Relation Age of Onset  . Alcohol abuse Mother   . Sickle cell trait Mother   . Hypertension Mother   . Alcohol abuse Father   . Sickle cell trait Father   . Early death Father        due to alcoholism  . Hypertension Father   . Hypertension Sister   . Hypertension Brother   . Hypertension Maternal Grandmother   . Hypertension Maternal Grandfather   . Hypertension Paternal Grandmother   . Hypertension Paternal Grandfather     Social History   Tobacco Use  . Smoking status: Current Every Day Smoker    Packs/day: 0.50    Types: Cigars  . Smokeless tobacco: Never Used  Substance Use Topics   . Alcohol use: Yes    Comment: occasional but indicated etoh abuse on health hx questionaire  . Drug use: No    Home Medications Prior to Admission medications   Medication Sig Start Date End Date Taking? Authorizing Provider  ergocalciferol (VITAMIN D2) 1.25 MG (50000 UT) capsule Take 50,000 Units by mouth every 30 (thirty) days.  07/07/20 07/07/21 Yes [provider]  naproxen sodium (ALEVE) 220 MG tablet Take 440 mg by mouth.   Yes [provider]  oxyCODONE-acetaminophen (PERCOCET) 10-325 MG tablet Take 1 tablet by mouth See admin instructions. Take 1 tablet by mouth every 4-6 hours as needed for pain 05/26/20  Yes [provider]  tetrahydrozoline-zinc (VISINE-AC) 0.05-0.25 % ophthalmic solution Place 2 drops into both eyes 3 (three) times daily as needed (dry eyes).   Yes [provider]  cetirizine (ZYRTEC) 10 MG tablet Take 1 tablet (10 mg total) by mouth daily. Patient not taking: Reported on 06/03/2020 04/07/18   Palma Holter, MD  fluticasone [facility]) 50 MCG/ACT nasal spray Place 2 sprays into both nostrils daily. Patient not taking: Reported on 06/03/2020 04/07/18   Palma Holter, MD  folic acid (FOLVITE) 1 MG tablet Take 1 tablet (1 mg total) by mouth daily. Patient not taking: Reported  on 09/04/2020 07/19/14   Felix Pacini A, DO  HYDROcodone-acetaminophen (NORCO/VICODIN) 5-325 MG tablet Take 1 tablet by mouth every 4 (four) hours as needed. Patient not taking: Reported on 06/03/2020 11/04/16   Rolland Porter, MD  ibuprofen (ADVIL,MOTRIN) 800 MG tablet Take 1 tablet (800 mg total) by mouth 3 (three) times daily. Patient not taking: Reported on 06/03/2020 11/04/16   Rolland Porter, MD  morphine (MSIR) 15 MG tablet Take 1 tablet (15 mg total) by mouth every 4 (four) hours as needed for pain. Patient not taking: Reported on 11/04/2016 10/28/12   Andrena Mews, DO  oxyCODONE-acetaminophen (ROXICET) 5-325 MG per tablet Take 1 tablet by mouth every 8  (eight) hours as needed for severe pain. Patient not taking: Reported on 06/03/2020 04/11/15   Felix Pacini A, DO    Allergies    Aspirin and Tramadol  Review of Systems   Review of Systems  Constitutional: Negative for chills and fever.  HENT: Negative for ear pain and sore throat.   Eyes: Negative for pain and visual disturbance.  Respiratory: Negative for cough and shortness of breath.   Cardiovascular: Negative for chest pain and palpitations.  Gastrointestinal: Negative for abdominal pain and vomiting.  Genitourinary: Negative for dysuria and hematuria.  Musculoskeletal: Positive for arthralgias, back pain and myalgias.  Skin: Negative for color change and rash.  Neurological: Negative for seizures and syncope.  Psychiatric/Behavioral: Positive for suicidal ideas.  All other systems reviewed and are negative.   Physical Exam Updated Vital Signs BP (!) 125/92 (BP Location: Right Arm)   Pulse 61   Temp 98.2 F (36.8 C) (Oral)   Resp 12   SpO2 98%   Physical Exam Vitals and nursing note reviewed.  Constitutional:      Appearance: He is well-developed.  HENT:     Head: Normocephalic and atraumatic.  Eyes:     Conjunctiva/sclera: Conjunctivae normal.  Cardiovascular:     Rate and Rhythm: Normal rate and regular rhythm.     Heart sounds: No murmur heard.   Pulmonary:     Effort: Pulmonary effort is normal. No respiratory distress.     Breath sounds: Normal breath sounds.  Abdominal:     Palpations: Abdomen is soft.     Tenderness: There is no abdominal tenderness.  Musculoskeletal:        General: No deformity or signs of injury. Normal range of motion.     Cervical back: Neck supple.  Skin:    General: Skin is warm and dry.  Neurological:     General: No focal deficit present.     Mental Status: He is alert.  Psychiatric:        Thought Content: Thought content normal.        Judgment: Judgment normal.     Comments: No SI or HI or AVH     ED Results /  Procedures / Treatments   Labs (all labs ordered are listed, but only abnormal results are displayed) Labs Reviewed  CBC WITH DIFFERENTIAL/PLATELET - Abnormal; Notable for the following components:      Result Value   RBC 2.03 (*)    Hemoglobin 6.5 (*)    HCT 19.0 (*)    RDW 28.1 (*)    nRBC 24.0 (*)    All other components within normal limits  RETICULOCYTES - Abnormal; Notable for the following components:   Retic Ct Pct 9.7 (*)    RBC. 2.02 (*)    Retic Count, Absolute 196.7 (*)  Immature Retic Fract 52.9 (*)    All other components within normal limits  RESPIRATORY PANEL BY RT PCR (FLU A&B, COVID)  BASIC METABOLIC PANEL    EKG None  Radiology No results found.  Procedures Procedures (including critical care time)  Medications Ordered in ED Medications  HYDROmorphone (DILAUDID) injection 2 mg (has no administration in time range)  HYDROmorphone (DILAUDID) injection 2 mg (2 mg Intravenous Given 09/04/20 1800)  HYDROmorphone (DILAUDID) injection 2 mg (2 mg Intravenous Given 09/04/20 1933)  oxyCODONE-acetaminophen (PERCOCET/ROXICET) 5-325 MG per tablet 2 tablet (2 tablets Oral Given 09/04/20 1739)  ondansetron (ZOFRAN) injection 4 mg (4 mg Intravenous Given 09/04/20 1933)    ED Course  I have reviewed the triage vital signs and the nursing notes.  Pertinent labs & imaging results that were available during my care of the patient were reviewed by me and considered in my medical decision making (see chart for details).    MDM Rules/Calculators/A&P                         54 year old male presenting to ER with concern for pain crisis in setting of known sickle cell disease.  On exam, patient well-appearing in no distress with normal vital signs, afebrile.  His lab work today demonstrating mild drop in his hemoglobin, elevation in reticulocyte count, consistent with pain crisis.  Provided aggressive pain control with IV Dilaudid, home Percocet.    Patient still having  severe pain despite multiple doses of IV Dilaudid.  Believe he would benefit from further observation and management of acute pain crisis.  Regarding his suicidality, no active SI at present but believe he would benefit from eval by TTS, given he will be admitted, will defer TTS eval for now.  Will consult hospitalist for admission.    Final Clinical Impression(s) / ED Diagnoses Final diagnoses:  Sickle cell pain crisis (HCC)  Anemia, unspecified type    Rx / DC Orders ED Discharge Orders    None       Milagros Loll, MD 09/04/20 2016

## 2020-09-04 NOTE — ED Notes (Signed)
Report attempted to [place], RN on 6E. No answer at this time. Nurse to call back

## 2020-09-04 NOTE — [facility] Counselor (Signed)
TTS to see upon medical clearance.

## 2020-09-04 NOTE — H&P (Signed)
History and Physical    Scott Norton XBJ:[id number] DOB: 1965/12/03 DOA: 09/04/2020  PCP: Patient, No Pcp Per   Patient coming from:  Homeless  Chief Complaint:  Sick cell pain crisis  HPI: Scott Norton is a 54 y.o. male with medical history significant for sickle cell anemia who presents to ER with sickle cell pain crisis.  Patient reports that he has been hurting in all of his extremities and back. He states that pain is similar to prior pain crises. Describes pain as intermittent sharp pains on achy pain.  No nausea or vomiting, no fever.  No chest pain or difficulty breathing. He states he had no aggravating event that led to pain. He has used percocet for pain at home but it has not helped as well as normal for past few days and ran out of percocet yesterday. Patient reports that he has had some general suicidal thoughts over the past month due to his current homeless situation and feeling hopeless.  He reports that he does not have any active SI and no intentions to complete self-harm. States that has simply had thoughts that life has no meaning and he is not sure why he is alive.    Review of Systems:  General: Denies weakness, fever, chills, weight loss, night sweats.  Denies dizziness.  Denies change in appetite HENT: Denies head trauma, headache, denies change in hearing, tinnitus.  Denies nasal congestion or bleeding.  Denies sore throat, sores in mouth.  Denies difficulty swallowing Eyes: Denies blurry vision, pain in eye, drainage.  Denies discoloration of eyes. Neck: Denies pain.  Denies swelling.  Denies pain with movement. Cardiovascular: Denies chest pain, palpitations.  Denies edema.  Denies orthopnea Respiratory: Denies shortness of breath, cough.  Denies wheezing.  Denies sputum production Gastrointestinal: Denies abdominal pain, swelling.  Denies nausea, vomiting, diarrhea.  Denies melena.  Denies hematemesis. Musculoskeletal: Has pain in back, hips, shoulders. Denies  limitation of movement.  Denies deformity or swelling. Genitourinary: Denies pelvic pain.  Denies urinary frequency or hesitancy.  Denies dysuria.  Skin: Denies rash.  Denies petechiae, purpura, ecchymosis. Neurological: Denies headache.  Denies syncope.  Denies seizure activity.  Denies weakness or paresthesia.  Denies slurred speech, drooping face.  Denies visual change. Psychiatric: reports depression and suicidal thoughts at times. No anxiety. Denies hallucinations.  Past Medical History:  Diagnosis Date  . Anemia   . Depression   . Heart murmur   . Pneumonia    ?ACS requring intubation  . Seasonal allergies    wool & grass  . Sickle cell anemia (HCC)     Past Surgical History:  Procedure Laterality Date  . ROOT CANAL      Social History  reports that he has been smoking cigars. He has been smoking about 0.50 packs per day. He has never used smokeless tobacco. He reports current alcohol use. He reports previous drug use.  Allergies  Allergen Reactions  . Aspirin Other (See Comments)    Increased heart rate  . Tramadol Hives and Itching    Family History  Problem Relation Age of Onset  . Alcohol abuse Mother   . Sickle cell trait Mother   . Hypertension Mother   . Alcohol abuse Father   . Sickle cell trait Father   . Early death Father        due to alcoholism  . Hypertension Father   . Hypertension Sister   . Hypertension Brother   . Hypertension Maternal Grandmother   .  Hypertension Maternal Grandfather   . Hypertension Paternal Grandmother   . Hypertension Paternal Grandfather      Prior to Admission medications   Medication Sig Start Date End Date Taking? Authorizing Provider  ergocalciferol (VITAMIN D2) 1.25 MG (50000 UT) capsule Take 50,000 Units by mouth every 30 (thirty) days.  07/07/20 07/07/21 Yes [provider]  naproxen sodium (ALEVE) 220 MG tablet Take 440 mg by mouth.   Yes [provider]  oxyCODONE-acetaminophen (PERCOCET)  10-325 MG tablet Take 1 tablet by mouth See admin instructions. Take 1 tablet by mouth every 4-6 hours as needed for pain 05/26/20  Yes [provider]  tetrahydrozoline-zinc (VISINE-AC) 0.05-0.25 % ophthalmic solution Place 2 drops into both eyes 3 (three) times daily as needed (dry eyes).   Yes [provider]  cetirizine (ZYRTEC) 10 MG tablet Take 1 tablet (10 mg total) by mouth daily. Patient not taking: Reported on 06/03/2020 04/07/18   Palma Holter, MD  fluticasone [facility]) 50 MCG/ACT nasal spray Place 2 sprays into both nostrils daily. Patient not taking: Reported on 06/03/2020 04/07/18   Palma Holter, MD  folic acid (FOLVITE) 1 MG tablet Take 1 tablet (1 mg total) by mouth daily. Patient not taking: Reported on 09/04/2020 07/19/14   Felix Pacini A, DO  HYDROcodone-acetaminophen (NORCO/VICODIN) 5-325 MG tablet Take 1 tablet by mouth every 4 (four) hours as needed. Patient not taking: Reported on 06/03/2020 11/04/16   Rolland Porter, MD  ibuprofen (ADVIL,MOTRIN) 800 MG tablet Take 1 tablet (800 mg total) by mouth 3 (three) times daily. Patient not taking: Reported on 06/03/2020 11/04/16   Rolland Porter, MD  morphine (MSIR) 15 MG tablet Take 1 tablet (15 mg total) by mouth every 4 (four) hours as needed for pain. Patient not taking: Reported on 11/04/2016 10/28/12   Andrena Mews, DO  oxyCODONE-acetaminophen (ROXICET) 5-325 MG per tablet Take 1 tablet by mouth every 8 (eight) hours as needed for severe pain. Patient not taking: Reported on 06/03/2020 04/11/15   Natalia Leatherwood, DO    Physical Exam: Vitals:   09/04/20 1945 09/04/20 2000 09/04/20 2015 09/04/20 2030  BP:  (!) 125/92  (!) 133/118  Pulse: 67 61 67 61  Resp: 16 12 13 16   Temp:      TempSrc:      SpO2: 94% 98% 100% (!) 84%    Constitutional: NAD, calm, comfortable Vitals:   09/04/20 1945 09/04/20 2000 09/04/20 2015 09/04/20 2030  BP:  (!) 125/92  (!) 133/118  Pulse: 67 61 67 61  Resp: 16 12 13 16    Temp:      TempSrc:      SpO2: 94% 98% 100% (!) 84%   General: WDWN, Alert and oriented x3.  Eyes: EOMI, PERRL, lids and conjunctivae normal.  Sclera nonicteric HENT:  [place]/AT, external ears normal.  Nares patent without epistasis. Mucous membranes are dry. Posterior pharynx clear of any exudate or lesions. Neck: Soft, normal range of motion, supple, no masses, Trachea midline Respiratory: clear to auscultation bilaterally, no wheezing, no crackles. Normal respiratory effort. No accessory muscle use.  Cardiovascular: Regular rate and rhythm, no murmurs / rubs / gallops. No extremity edema. 2+ pedal pulses. No carotid bruits.  Abdomen: Soft, no tenderness, nondistended, no rebound or guarding.  No masses palpated. No hepatosplenomegaly. Bowel sounds normoactive Musculoskeletal: FROM. Has clubbing of digits.  No cyanosis. No joint deformity upper and lower extremities.  Normal muscle tone.  Skin: Warm, dry, intact no rashes, lesions,  ulcers. No induration Neurologic: CN 2-12 grossly intact.  Normal speech.  Sensation intact, strength 5/5 bilaterally Psychiatric: Normal judgment and insight.  Mood with flat affect.   Labs on Admission: I have personally reviewed following labs and imaging studies  CBC: Recent Labs  Lab 09/04/20 1714  WBC 6.5  NEUTROABS 2.5  HGB 6.5*  HCT 19.0*  MCV 93.6  PLT 186    Basic Metabolic Panel: Recent Labs  Lab 09/04/20 1714  NA 140  K 4.5  CL 109  CO2 25  GLUCOSE 80  BUN 13  CREATININE 1.24  CALCIUM 9.2    GFR: CrCl cannot be calculated (Unknown ideal weight.).  Liver Function Tests: No results for input(s): AST, ALT, ALKPHOS, BILITOT, PROT, ALBUMIN in the last 168 hours.  Urine analysis: No results found for: COLORURINE, APPEARANCEUR, LABSPEC, PHURINE, GLUCOSEU, HGBUR, BILIRUBINUR, KETONESUR, PROTEINUR, UROBILINOGEN, NITRITE, LEUKOCYTESUR  Radiological Exams on Admission: No results found.   Assessment/Plan Principal Problem:    Sickle cell pain crisis [facility]) Mr. Blackshire is admitted to MedSurg floor with a sickle cell pain crisis. Pain control be provided with Dilaudid 1 mg every 2 hours as needed for severe pain overnight.  He will also be given Toradol 50 mg every 8 hours as needed for 3 doses for moderate pain if needed. IV fluid hydration with normal saline at 50 mils per hour overnight. Patient will need follow-up with sickle cell team and clinic  Active Problems:   Sickle cell anemia (HCC) Hemoglobin is 6.5 when patient presented to emergency room.  Transfuse 1 unit of packed red blood cells.  Transfusion risk and benefit discussed with patient and he consents.  States he had a transfusion a few years ago for sickle cell anemia    Verbalizes suicidal thoughts Patient verbalizes that he feels hopeless and depressed and had thoughts that he has no meaning in life and would be better if he was dead.  He has no suicidal plan and does not have any desire to hurt himself at this time. We will have psychiatry evaluate patient in the morning and make recommendations for further treatment    Tobacco use Nicotine patch provided to prevent withdrawal while in the hospital.  Smoking cessation education will be provided before discharge     Homeless Will have social work valuate patient and his help with finding programs and assistance for patient    DVT prophylaxis: Pad[place] score low.  TED hose and ambulation for DVT prophylaxis Code Status:   Full code Family Communication:  Diagnosis and plan discussed with patient.  Patient verbalized understanding agrees with plan.  Further recommendations to follow as clinically indicated Disposition Plan:   Patient is from:  Homeless  Anticipated DC to:  Will have discharge planning/social work evaluate patient and assist with discharge disposition for housing needs  Anticipated DC date:  Anticipate greater than 2 midnights in hospital to treat acute medical condition   Consults  called:  Will consult psychiatry in the morning for evaluation of patient as he is having suicidal thoughts but he has no suicidal plan.  After discussion with patient he is feeling hopeless and depressed with thoughts that life has no meaning and he would benefit from psychiatry evaluation. Admission status:  Inpatient    Claudean Severance Henlee Donovan MD [facility]  How to contact the [facility] Attending or Consulting provider 7A - 7P or covering provider during after hours 7P -7A, for this patient?   1. Check the care team in [facility]  and look for a) attending/consulting [facility] provider listed and b) the [facility] team listed 2. Log into [email or web address] and use [place]'s universal password to access. If you do not have the password, please contact the hospital operator. 3. Locate the [facility] provider you are looking for under [facility] and page to a number that you can be directly reached. 4. If you still have difficulty reaching the provider, please page the [facility] (Director on Call) for the Hospitalists listed on amion for assistance.  09/04/2020, 8:55 PM

## 2020-09-04 NOTE — ED Notes (Signed)
Report called to Sharyl Nimrod, RN on 6E

## 2020-09-05 ENCOUNTER — Other Ambulatory Visit: Payer: Self-pay | Admitting: Family Medicine

## 2020-09-05 DIAGNOSIS — D5700 Hb-SS disease with crisis, unspecified: Principal | ICD-10-CM

## 2020-09-05 LAB — CBC
HCT: 14.2 % — ABNORMAL LOW (ref 39.0–52.0)
HCT: 17.4 % — ABNORMAL LOW (ref 39.0–52.0)
Hemoglobin: 5 g/dL — CL (ref 13.0–17.0)
Hemoglobin: 6.1 g/dL — CL (ref 13.0–17.0)
MCH: 31.4 pg (ref 26.0–34.0)
MCH: 30.3 pg (ref 26.0–34.0)
MCHC: 35.2 g/dL (ref 30.0–36.0)
MCHC: 35.1 g/dL (ref 30.0–36.0)
MCV: 89.3 fL (ref 80.0–100.0)
MCV: 86.6 fL (ref 80.0–100.0)
Platelets: 130 10*3/uL — ABNORMAL LOW (ref 150–400)
Platelets: 142 10*3/uL — ABNORMAL LOW (ref 150–400)
RBC: 1.59 MIL/uL — ABNORMAL LOW (ref 4.22–5.81)
RBC: 2.01 MIL/uL — ABNORMAL LOW (ref 4.22–5.81)
RDW: 27.5 % — ABNORMAL HIGH (ref 11.5–15.5)
RDW: 24.2 % — ABNORMAL HIGH (ref 11.5–15.5)
WBC: 11.8 10*3/uL — ABNORMAL HIGH (ref 4.0–10.5)
WBC: 8.5 10*3/uL (ref 4.0–10.5)
nRBC: 14.1 % — ABNORMAL HIGH (ref 0.0–0.2)
nRBC: 18.2 % — ABNORMAL HIGH (ref 0.0–0.2)

## 2020-09-05 LAB — BASIC METABOLIC PANEL
Anion gap: 7 (ref 5–15)
BUN: 17 mg/dL (ref 6–20)
CO2: 25 mmol/L (ref 22–32)
Calcium: 8.9 mg/dL (ref 8.9–10.3)
Chloride: 109 mmol/L (ref 98–111)
Creatinine, Ser: 1.71 mg/dL — ABNORMAL HIGH (ref 0.61–1.24)
GFR, Estimated: 47 mL/min — ABNORMAL LOW (ref >60)
Glucose, Bld: 84 mg/dL (ref 70–99)
Potassium: 5.2 mmol/L — ABNORMAL HIGH (ref 3.5–5.1)
Sodium: 141 mmol/L (ref 135–145)

## 2020-09-05 LAB — ABO/RH: ABO/RH(D): O POS

## 2020-09-05 LAB — HIV ANTIBODY (ROUTINE TESTING W REFLEX): HIV Screen 4th Generation wRfx: NONREACTIVE

## 2020-09-05 MED ORDER — PROMETHAZINE HCL 25 MG/ML IJ SOLN
6.2500 mg | INTRAMUSCULAR | Status: DC | PRN
  Administered 2020-09-05: 6.25 mg via INTRAVENOUS
  Filled 2020-09-05: qty 1

## 2020-09-05 MED ORDER — HYDROMORPHONE 1 MG/ML IV SOLN
INTRAVENOUS | Status: DC
  Administered 2020-09-05: 1.5 mg via INTRAVENOUS
  Administered 2020-09-05: 1.3 mg via INTRAVENOUS
  Administered 2020-09-06: 0.3 mg via INTRAVENOUS
  Administered 2020-09-06: 0.6 mg via INTRAVENOUS
  Administered 2020-09-06: 0 mg via INTRAVENOUS
  Administered 2020-09-06: 0.6 mg via INTRAVENOUS
  Administered 2020-09-06: 0.3 mg via INTRAVENOUS
  Administered 2020-09-06: 0 mg via INTRAVENOUS
  Administered 2020-09-07: 0.3 mg via INTRAVENOUS
  Administered 2020-09-07: 0 mg via INTRAVENOUS
  Administered 2020-09-07: 0.3 mg via INTRAVENOUS
  Administered 2020-09-07: 0.6 mg via INTRAVENOUS
  Administered 2020-09-07 (×3): 0.3 mg via INTRAVENOUS
  Administered 2020-09-08: 0.9 mg via INTRAVENOUS
  Administered 2020-09-08: 0 mg via INTRAVENOUS
  Administered 2020-09-08: 0.3 mg via INTRAVENOUS
  Filled 2020-09-05: qty 30

## 2020-09-05 MED ORDER — NALOXONE HCL 0.4 MG/ML IJ SOLN: 0.4000 mg | INTRAMUSCULAR | Status: DC | PRN

## 2020-09-05 MED ORDER — SODIUM CHLORIDE 0.9% FLUSH: 9.0000 mL | INTRAVENOUS | Status: DC | PRN

## 2020-09-05 MED ORDER — KETOROLAC TROMETHAMINE 15 MG/ML IJ SOLN
15.0000 mg | Freq: Four times a day (QID) | INTRAMUSCULAR | Status: AC
  Administered 2020-09-05 – 2020-09-10 (×20): 15 mg via INTRAVENOUS
  Filled 2020-09-05 (×20): qty 1

## 2020-09-05 MED ORDER — DIPHENHYDRAMINE HCL 12.5 MG/5ML PO ELIX: 12.5000 mg | ORAL_SOLUTION | Freq: Four times a day (QID) | ORAL | Status: DC | PRN

## 2020-09-05 NOTE — Consult Note (Signed)
[facility] Face-to-Face Psychiatry Consult   Reason for Consult:  Suicidal ideation Referring Physician:  Dr. Hyman Hopes Patient Identification: Scott Norton MRN:  [id number] Principal Diagnosis: Sickle cell pain crisis [facility]) Diagnosis:  Principal Problem:   Sickle cell pain crisis (HCC) Active Problems:   Sickle cell anemia (HCC)   Verbalizes suicidal thoughts   Tobacco use  Total Time spent with patient: 30 minutes  Subjective:   Scott Norton is a 54 y.o. male patient admitted with suicidal ideations. He states he has experienced recently losses which contributed to his current state of depression. He endorses symptoms of hopelessness, sadness, thoughts of death, decreased appetite, mood swings and worthlessness dating back from months. He does admit to suicidal ideations at this time with a plan to jump off a bridge into an 18 wheeler. He does identify means and has access to bridges in the area one in particular near the hotel in which he used to reside at. He denies any additional psych history, however he does have leal charges pending at this time. Next court date for 12/16.   HPI:  Scott Norton is a 54 y.o. male with medical history significant for sickle cell anemia who presents to ER with sickle cell pain crisis. Patient reports that he has been hurting in all of his extremities and back. He states that pain is similar to prior pain crises. Describes pain as intermittent sharp pains on achy pain. No nausea or vomiting, no fever. No chest pain or difficulty breathing. He states he had no aggravating event that led to pain. He has used percocet for pain at home but it has not helped as well as normal for past few days and ran out of percocet yesterday. Patient reports that he has had some general suicidal thoughts over the past month due to his current homeless situation and feeling hopeless. He reports that he does not have any active SI and no intentions to complete self-harm. States that has  simply had thoughts that life has no meaning and he is not sure why he is alive.   Past Psychiatric History: Depression. Currently not receiving any psychiatric services at this time. Denies any previous suicidal ideations or attempts.   Risk to Self:  Yes Risk to Others:  Denies Prior Inpatient Therapy:  Denies Prior Outpatient Therapy:  Denies  Past Medical History:  Past Medical History:  Diagnosis Date  . Anemia   . Depression   . Heart murmur   . Pneumonia    ?ACS requring intubation  . Seasonal allergies    wool & grass  . Sickle cell anemia (HCC)     Past Surgical History:  Procedure Laterality Date  . ROOT CANAL     Family History:  Family History  Problem Relation Age of Onset  . Alcohol abuse Mother   . Sickle cell trait Mother   . Hypertension Mother   . Alcohol abuse Father   . Sickle cell trait Father   . Early death Father        due to alcoholism  . Hypertension Father   . Hypertension Sister   . Hypertension Brother   . Hypertension Maternal Grandmother   . Hypertension Maternal Grandfather   . Hypertension Paternal Grandmother   . Hypertension Paternal Grandfather    Family Psychiatric  History: Noncontributory, was "given away by his mother."  Social History:  Social History   Substance and Sexual Activity  Alcohol Use Yes   Comment: occasional but indicated etoh abuse  on health hx questionaire     Social History   Substance and Sexual Activity  Drug Use Not Currently    Social History   Socioeconomic History  . Marital status: Single    Spouse name: Not on file  . Number of children: Not on file  . Years of education: Not on file  . Highest education level: Not on file  Occupational History  . Not on file  Tobacco Use  . Smoking status: Current Every Day Smoker    Packs/day: 0.50    Types: Cigars  . Smokeless tobacco: Never Used  Substance and Sexual Activity  . Alcohol use: Yes    Comment: occasional but indicated etoh abuse  on health hx questionaire  . Drug use: Not Currently  . Sexual activity: Yes  Other Topics Concern  . Not on file  Social History Narrative   Lives with Friends in [place]. Working on housing for himself   On disability for his SS disease      No children, no stable relationship   Social Determinants of Corporate investment banker Strain:   . Difficulty of Paying Living Expenses: Not on file  Food Insecurity:   . Worried About Programme researcher, broadcasting/film/video in the Last Year: Not on file  . Ran Out of Food in the Last Year: Not on file  Transportation Needs:   . Lack of Transportation (Medical): Not on file  . Lack of Transportation (Non-Medical): Not on file  Physical Activity:   . Days of Exercise per Week: Not on file  . Minutes of Exercise per Session: Not on file  Stress:   . Feeling of Stress : Not on file  Social Connections:   . Frequency of Communication with Friends and Family: Not on file  . Frequency of Social Gatherings with Friends and Family: Not on file  . Attends Religious Services: Not on file  . Active Member of Clubs or Organizations: Not on file  . Attends Banker Meetings: Not on file  . Marital Status: Not on file   Additional Social History:    Allergies:   Allergies  Allergen Reactions  . Aspirin Other (See Comments)    Increased heart rate  . Tramadol Hives and Itching    Labs:  Results for orders placed or performed during the hospital encounter of 09/04/20 (from the past 48 hour(s))  CBC WITH DIFFERENTIAL     Status: Abnormal   Collection Time: 09/04/20  5:14 PM  Result Value Ref Range   WBC 6.5 4.0 - 10.5 K/uL   RBC 2.03 (L) 4.22 - 5.81 MIL/uL   Hemoglobin 6.5 (LL) 13.0 - 17.0 g/dL    Comment: This critical result has verified and been called to [facility] by TURNER,SHAWANA on 11 14 2021 at 1841, and has been read back.    HCT 19.0 (L) 39 - 52 %   MCV 93.6 80.0 - 100.0 fL   MCH 32.0 26.0 - 34.0 pg   MCHC 34.2 30.0 - 36.0 g/dL    RDW 69.4 (H) 50.3 - 15.5 %   Platelets 186 150 - 400 K/uL   nRBC 24.0 (H) 0.0 - 0.2 %   Neutrophils Relative % 39 %   Neutro Abs 2.5 1.7 - 7.7 K/uL   Lymphocytes Relative 44 %   Lymphs Abs 2.9 0.7 - 4.0 K/uL   Monocytes Relative 12 %   Monocytes Absolute 0.8 0.1 - 1.0 K/uL   Eosinophils Relative 3 %  Eosinophils Absolute 0.2 0.0 - 0.5 K/uL   Basophils Relative 1 %   Basophils Absolute 0.0 0.0 - 0.1 K/uL   Immature Granulocytes 1 %   Abs Immature Granulocytes 0.03 0.00 - 0.07 K/uL   Pappenheimer Bodies PRESENT    Carollee Massed Bodies PRESENT    Polychromasia PRESENT    Sickle Cells PRESENT    Target Cells PRESENT     Comment: Performed at [facility], [address] [place], [place] [address]  Reticulocytes     Status: Abnormal   Collection Time: 09/04/20  5:14 PM  Result Value Ref Range   Retic Ct Pct 9.7 (H) 0.4 - 3.1 %   RBC. 2.02 (L) 4.22 - 5.81 MIL/uL   Retic Count, Absolute 196.7 (H) 19.0 - 186.0 K/uL   Immature Retic Fract 52.9 (H) 2.3 - 15.9 %    Comment: Performed at [facility], [address], [place], [place] [address]  Basic metabolic panel     Status: None   Collection Time: 09/04/20  5:14 PM  Result Value Ref Range   Sodium 140 135 - 145 mmol/L   Potassium 4.5 3.5 - 5.1 mmol/L   Chloride 109 98 - 111 mmol/L   CO2 25 22 - 32 mmol/L   Glucose, Bld 80 70 - 99 mg/dL    Comment: Glucose reference range applies only to samples taken after fasting for at least 8 hours.   BUN 13 6 - 20 mg/dL   Creatinine, Ser 4.25 0.61 - 1.24 mg/dL   Calcium 9.2 8.9 - 95.6 mg/dL   GFR, Estimated >38 >75 mL/min    Comment: (NOTE) Calculated using the CKD-EPI Creatinine Equation (2021)    Anion gap 6 5 - 15    Comment: Performed at [facility], [address], [place], [place] [address]  ABO/Rh     Status: None   Collection Time: 09/04/20  5:14 PM  Result Value Ref Range   ABO/RH(D)      O POS Performed at [facility], [address] [place], [place] [address]   Respiratory Panel by RT PCR (Flu A&B, Covid) - Nasopharyngeal Swab     Status: None   Collection Time: 09/04/20  8:03 PM   Specimen: Nasopharyngeal Swab  Result Value Ref Range   SARS Coronavirus 2 by RT PCR NEGATIVE NEGATIVE    Comment: (NOTE) SARS-CoV-2 target nucleic acids are NOT DETECTED.  The SARS-CoV-2 RNA is generally detectable in upper respiratoy specimens during the acute phase of infection. The lowest concentration of SARS-CoV-2 viral copies this assay can detect is 131 copies/mL. A negative result does not preclude SARS-Cov-2 infection and should not be used as the sole basis for treatment or other patient management decisions. A negative result may occur with  improper specimen collection/handling, submission of specimen other than nasopharyngeal swab, presence of viral mutation(s) within the areas targeted by this assay, and inadequate number of viral copies (<131 copies/mL). A negative result must be combined with clinical observations, patient history, and epidemiological information. The expected result is Negative.  Fact Sheet for Patients:  https://www.moore.com/  Fact Sheet for Healthcare Providers:  https://www.young.biz/  This test is no t yet approved or cleared by the [place] FDA and  has been authorized for detection and/or diagnosis of SARS-CoV-2 by FDA under an Emergency Use Authorization (EUA). This EUA will remain  in effect (meaning this test can be used) for the duration of the COVID-19  declaration under Section 564(b)(1) of the Act, 21 U.S.C. section 360bbb-3(b)(1), unless the authorization is terminated or revoked sooner.     Influenza A by PCR NEGATIVE NEGATIVE   Influenza B by PCR NEGATIVE NEGATIVE    Comment: (NOTE) The Xpert Xpress SARS-CoV-2/FLU/RSV assay is intended as an aid in  the diagnosis of influenza from Nasopharyngeal  swab specimens and  should not be used as a sole basis for treatment. Nasal washings and  aspirates are unacceptable for Xpert Xpress SARS-CoV-2/FLU/RSV  testing.  Fact Sheet for Patients: https://www.moore.com/  Fact Sheet for Healthcare Providers: https://www.young.biz/  This test is not yet approved or cleared by the [place] FDA and  has been authorized for detection and/or diagnosis of SARS-CoV-2 by  FDA under an Emergency Use Authorization (EUA). This EUA will remain  in effect (meaning this test can be used) for the duration of the  Covid-19 declaration under Section 564(b)(1) of the Act, 21  U.S.C. section 360bbb-3(b)(1), unless the authorization is  terminated or revoked. Performed at [facility], [address], [place], [place] [address]   Type and screen [facility]     Status: None (Preliminary result)   Collection Time: 09/04/20  8:20 PM  Result Value Ref Range   ABO/RH(D) O POS    Antibody Screen NEG    Sample Expiration 09/07/2020,2359    Unit Number [id number]    Blood Component Type RED CELLS,LR    Unit division 00    Status of Unit ALLOCATED    Donor AG Type NEGATIVE FOR E ANTIGEN NEGATIVE FOR KELL ANTIGEN    Transfusion Status OK TO TRANSFUSE    Crossmatch Result      Compatible Performed at [facility], [address], [place], [place] [address]   Prepare RBC (crossmatch)     Status: None   Collection Time: 09/04/20  9:10 PM  Result Value Ref Range   Order Confirmation      ORDER PROCESSED BY BLOOD BANK Performed at [facility], [address] [place], [place] [address]   HIV Antibody (routine testing w rflx)     Status: None   Collection Time: 09/05/20  5:30 AM  Result Value Ref Range   HIV Screen 4th Generation wRfx Non Reactive Non Reactive    Comment: Performed at [facility], [address], [place], [place]  [address]  CBC     Status: Abnormal   Collection Time: 09/05/20  5:30 AM  Result Value Ref Range   WBC 11.8 (H) 4.0 - 10.5 K/uL   RBC 1.59 (L) 4.22 - 5.81 MIL/uL   Hemoglobin 5.0 (LL) 13.0 - 17.0 g/dL    Comment: REPEATED TO VERIFY CRITICAL VALUE NOTED.  VALUE IS CONSISTENT WITH PREVIOUSLY REPORTED AND CALLED VALUE.    HCT 14.2 (L) 39 - 52 %   MCV 89.3 80.0 - 100.0 fL   MCH 31.4 26.0 - 34.0 pg   MCHC 35.2 30.0 - 36.0 g/dL   RDW 46.9 (H) 62.9 - 52.8 %   Platelets 130 (L) 150 - 400 K/uL    Comment: Immature Platelet Fraction may be clinically indicated, consider ordering this additional test [id number]    nRBC 14.1 (H) 0.0 - 0.2 %    Comment: Performed at [facility], [address] [place], [place] [address]  Basic metabolic panel     Status: Abnormal   Collection Time: 09/05/20  5:30 AM  Result Value  Ref Range   Sodium 141 135 - 145 mmol/L   Potassium 5.2 (H) 3.5 - 5.1 mmol/L   Chloride 109 98 - 111 mmol/L   CO2 25 22 - 32 mmol/L   Glucose, Bld 84 70 - 99 mg/dL    Comment: Glucose reference range applies only to samples taken after fasting for at least 8 hours.   BUN 17 6 - 20 mg/dL   Creatinine, Ser 8.29 (H) 0.61 - 1.24 mg/dL   Calcium 8.9 8.9 - 93.7 mg/dL   GFR, Estimated 47 (L) >60 mL/min    Comment: (NOTE) Calculated using the CKD-EPI Creatinine Equation (2021)    Anion gap 7 5 - 15    Comment: Performed at [facility], [address] [place], [place] [address]    Current Facility-Administered Medications  Medication Dose Route Frequency Provider Last Rate Last Admin  . diphenhydrAMINE (BENADRYL) 12.5 MG/5ML elixir 12.5 mg  12.5 mg Oral Q6H PRN Massie Maroon, FNP      . HYDROmorphone (DILAUDID) 1 mg/mL PCA injection   Intravenous Q4H Massie Maroon, FNP   Set-up / Initial Syringe at 09/05/20 1308  . ketorolac (TORADOL) 15 MG/ML injection 15 mg  15 mg Intravenous Q6H Hollis, Rosalene Billings, FNP      . naloxone [facility]) injection  0.4 mg  0.4 mg Intravenous PRN Massie Maroon, FNP       And  . sodium chloride flush (NS) 0.9 % injection 9 mL  9 mL Intravenous PRN Julianne Handler M, FNP      . nicotine (NICODERM CQ - dosed in mg/24 hours) patch 14 mg  14 mg Transdermal Daily Chotiner, Claudean Severance, MD   14 mg at 09/05/20 1018  . oxyCODONE-acetaminophen (PERCOCET/ROXICET) 5-325 MG per tablet 1 tablet  1 tablet Oral Q4H PRN Chotiner, Claudean Severance, MD       And  . oxyCODONE (Oxy IR/ROXICODONE) immediate release tablet 5 mg  5 mg Oral Q4H PRN Chotiner, Claudean Severance, MD      . promethazine (PHENERGAN) injection 6.25 mg  6.25 mg Intravenous Q4H PRN Massie Maroon, FNP   6.25 mg at 09/05/20 1227  . senna-docusate (Senokot-S) tablet 1 tablet  1 tablet Oral QHS PRN Chotiner, Claudean Severance, MD      . traZODone (DESYREL) tablet 50 mg  50 mg Oral QHS PRN Chotiner, Claudean Severance, MD        Musculoskeletal: Strength & Muscle Tone: within normal limits Gait & Station: normal Patient leans: N/A  Psychiatric Specialty Exam: Physical Exam  Review of Systems  Blood pressure (!) 103/57, pulse 73, temperature 98.5 F (36.9 C), temperature source Oral, resp. rate 18, height 6\' 3"  (1.905 m), weight 69.5 kg, SpO2 92 %.Body mass index is 19.15 kg/m.  General Appearance: Disheveled  Eye Contact:  Fair  Speech:  Clear and Coherent and Pressured  Volume:  Increased  Mood:  Depressed, Dysphoric and Irritable  Affect:  Blunt and Depressed  Thought Process:  Coherent, Linear and Descriptions of Associations: Tangential  Orientation:  Full (Time, Place, and Person)  Thought Content:  Logical, Rumination and Tangential  Suicidal Thoughts:  Yes.  with intent/plan  Homicidal Thoughts:  No  Memory:  Immediate;   Fair Recent;   Fair  Judgement:  Intact  Insight:  Fair  Psychomotor Activity:  Psychomotor Retardation  Concentration:  Concentration: Fair and Attention Span: Fair  Recall:  of Knowledge:  Fair  Language:  Fair  Akathisia:  No   Handed:  Right  AIMS (if indicated):     Assets:  Communication Skills Desire for Improvement Leisure Time  ADL's:  Intact  Cognition:  WNL  Sleep:        Treatment Plan Summary: Plan Will start Sertraline 25mg  po daily for depression. WIll recommend inpatient once he is medically cleared. He is open to coming into the hospital for psych admission, voluntary. If patient wishes to leave he will need to be IVC'd. Psychiatry to sign off at this time.    Disposition: Recommend psychiatric Inpatient admission when medically cleared.  Maryagnes Amosakia S Starkes-Perry, FNP 09/05/2020 2:24 PM

## 2020-09-05 NOTE — Progress Notes (Signed)
Subjective: Scott Norton is a 54 year old male with a medical history significant for sickle cell disease type SS was admitted for sickle cell pain crisis.    Patient continues to complain of significant pain primarily to back and upper extremities.  Patient is also complaining of nausea and just had an episode of vomiting.  He says that he typically does not take IV medications and feels that nausea and vomiting are result of taking pain medications.  On admission, patient reported that he had thoughts of general suicide especially over the past couple of days.  Suicide is mostly due to his current living situation.  Patient is homeless.  He endorses feelings of anhedonia and hopelessness.  He states that he recently lost his dogs that meant a lot.  Patient states that on yesterday he had thoughts of jumping off of a bridge onto an 18 wheeler.  These thoughts occurred after he spent his last $50 on a hotel room for 1 night and had a very bad experience.  Hecurrently questioning whether his life has any meaning and says that he is very sad most of the time    Objective:  Vital signs in last 24 hours:  Vitals:   09/04/20 2345 09/05/20 0240 09/05/20 0630 09/05/20 1026  BP:  117/61 116/68 (!) 117/54  Pulse:  65 74 96  Resp:  16 14 18   Temp:  98.2 F (36.8 C) 97.7 F (36.5 C) 98.1 F (36.7 C)  TempSrc:  Oral Oral Oral  SpO2:  93% 92% 94%  Weight: 71 kg  69.5 kg   Height: 6\' 3"  (1.905 m)       Intake/Output from previous day:   Intake/Output Summary (Last 24 hours) at 09/05/2020 1239 Last data filed at 09/05/2020 0930 Gross per 24 hour  Intake 1356.66 ml  Output 200 ml  Net 1156.66 ml    Physical Exam: General: Alert, awake, oriented x3, in no acute distress.  HEENT: [place]/AT PEERL, EOMI Neck: Trachea midline,  no masses, no thyromegal,y no JVD, no carotid bruit OROPHARYNX:  Moist, No exudate/ erythema/lesions.  Heart: Regular rate and rhythm, without murmurs, rubs, gallops, PMI  non-displaced, no heaves or thrills on palpation.  Lungs: Clear to auscultation, no wheezing or rhonchi noted. No increased vocal fremitus resonant to percussion  Abdomen: Soft, nontender, nondistended, positive bowel sounds, no masses no hepatosplenomegaly noted..  Neuro: No focal neurological deficits noted cranial nerves II through XII grossly intact. DTRs 2+ bilaterally upper and lower extremities. Strength 5 out of 5 in bilateral upper and lower extremities. Musculoskeletal: No warm swelling or erythema around joints, no spinal tenderness noted. Psychiatric: Patient alert and oriented x3, good insight and cognition, good recent to remote recall. Lymph node survey: No cervical axillary or inguinal lymphadenopathy noted.  Lab Results:  Basic Metabolic Panel:    Component Value Date/Time   NA 141 09/05/2020 0530   K 5.2 (H) 09/05/2020 0530   CL 109 09/05/2020 0530   CO2 25 09/05/2020 0530   BUN 17 09/05/2020 0530   CREATININE 1.71 (H) 09/05/2020 0530   CREATININE 0.90 07/19/2014 1128   GLUCOSE 84 09/05/2020 0530   CALCIUM 8.9 09/05/2020 0530   CBC:    Component Value Date/Time   WBC 11.8 (H) 09/05/2020 0530   HGB 5.0 (LL) 09/05/2020 0530   HCT 14.2 (L) 09/05/2020 0530   PLT 130 (L) 09/05/2020 0530   MCV 89.3 09/05/2020 0530   NEUTROABS 2.5 09/04/2020 1714   LYMPHSABS 2.9 09/04/2020  1714   MONOABS 0.8 09/04/2020 1714   EOSABS 0.2 09/04/2020 1714   BASOSABS 0.0 09/04/2020 1714    Recent Results (from the past 240 hour(s))  Respiratory Panel by RT PCR (Flu A&B, Covid) - Nasopharyngeal Swab     Status: None   Collection Time: 09/04/20  8:03 PM   Specimen: Nasopharyngeal Swab  Result Value Ref Range Status   SARS Coronavirus 2 by RT PCR NEGATIVE NEGATIVE Final    Comment: (NOTE) SARS-CoV-2 target nucleic acids are NOT DETECTED.  The SARS-CoV-2 RNA is generally detectable in upper respiratoy specimens during the acute phase of infection. The lowest concentration of  SARS-CoV-2 viral copies this assay can detect is 131 copies/mL. A negative result does not preclude SARS-Cov-2 infection and should not be used as the sole basis for treatment or other patient management decisions. A negative result may occur with  improper specimen collection/handling, submission of specimen other than nasopharyngeal swab, presence of viral mutation(s) within the areas targeted by this assay, and inadequate number of viral copies (<131 copies/mL). A negative result must be combined with clinical observations, patient history, and epidemiological information. The expected result is Negative.  Fact Sheet for Patients:  https://www.moore.com/  Fact Sheet for Healthcare Providers:  https://www.young.biz/  This test is no t yet approved or cleared by the [place] FDA and  has been authorized for detection and/or diagnosis of SARS-CoV-2 by FDA under an Emergency Use Authorization (EUA). This EUA will remain  in effect (meaning this test can be used) for the duration of the COVID-19 declaration under Section 564(b)(1) of the Act, 21 U.S.C. section 360bbb-3(b)(1), unless the authorization is terminated or revoked sooner.     Influenza A by PCR NEGATIVE NEGATIVE Final   Influenza B by PCR NEGATIVE NEGATIVE Final    Comment: (NOTE) The Xpert Xpress SARS-CoV-2/FLU/RSV assay is intended as an aid in  the diagnosis of influenza from Nasopharyngeal swab specimens and  should not be used as a sole basis for treatment. Nasal washings and  aspirates are unacceptable for Xpert Xpress SARS-CoV-2/FLU/RSV  testing.  Fact Sheet for Patients: https://www.moore.com/  Fact Sheet for Healthcare Providers: https://www.young.biz/  This test is not yet approved or cleared by the [place] FDA and  has been authorized for detection and/or diagnosis of SARS-CoV-2 by  FDA under an Emergency Use  Authorization (EUA). This EUA will remain  in effect (meaning this test can be used) for the duration of the  Covid-19 declaration under Section 564(b)(1) of the Act, 21  U.S.C. section 360bbb-3(b)(1), unless the authorization is  terminated or revoked. Performed at [facility], [address], [place], [place] [address]     Studies/Results: No results found.  Medications: Scheduled Meds: . HYDROmorphone   Intravenous Q4H  . ketorolac  15 mg Intravenous Q6H  . nicotine  14 mg Transdermal Daily   Continuous Infusions: PRN Meds:.diphenhydrAMINE, naloxone **AND** sodium chloride flush, oxyCODONE-acetaminophen **AND** oxyCODONE, promethazine, senna-docusate, traZODone  Consultants:  Psychiatry  Procedures:  None  Antibiotics:  None  Assessment/Plan: Principal Problem:   Sickle cell pain crisis (HCC) Active Problems:   Sickle cell anemia (HCC)   Verbalizes suicidal thoughts   Tobacco use  Sickle cell pain crisis: Discontinue normal saline at 100.  Initiate 0.45% saline at 100 mL/h. Initiate IV Dilaudid PCA per full dose Toradol 15 mg IV every 6 hours for total of 5 days. Monitor vital signs closely, reevaluate pain scale regularly, and supplemental oxygen as needed.  Sickle  cell anemia: Today, hemoglobin is 5.0, appears to be below patient's baseline of 8-9 g/dL.  Blood transfusion ordered, patient to receive 1 unit PRBCs.  Will reassess in AM.  Patient is followed by [facility] hematology.  His last appointment was in August 2021.  Hematology manages patient's pain medications.  Leukocytosis: Mild leukocytosis.  More than likely secondary to vaso-occlusive pain crisis.  Patient is afebrile without any signs of infection or inflammation.  Monitor vital signs closely.  Repeat CBC in a.m.  Chronic pain syndrome: Hold Percocet, use PCA Dilaudid  Tobacco dependence: Nicotine patch.Smoking cessation instruction/counseling given:  counseled patient  on the dangers of tobacco use, advised patient to stop smoking, and reviewed strategies to maximize success   Depression:  Patient has significant depression and verbalizes suicidal thoughts.  Psychiatry has been consulted.  Will defer to psychology for further treatment and evaluation of patient's depression.  Homelessness: Social work has been consulted.  Also, will contact [facility] for community assistance.  Code Status: Full Code Family Communication: N/A Disposition Plan: Not yet ready for discharge   Giorgio Chabot Rennis Petty  APRN, MSN, FNP-C Patient Care [facility] [address] [place], [place] [address] [phone]  If 5PM-8AM, please contact night-coverage.  09/05/2020, 12:39 PM  LOS: 1 day

## 2020-09-05 NOTE — Progress Notes (Signed)
Patient was recently admitted to the floor.  When this RN asked if the patient had suicidal ideation, he reported yes. He does not have a method at this time. He reports he does not know what will happen or what he might do to himself once he leaves the hospital.  Patient does express hopelessness and feels abandoned/alone in this world. Patient does not have any close family or friends.  Patient was placed on proper precautions for high risk SI. He is awaiting psych consult. Priscille Kluver

## 2020-09-06 DIAGNOSIS — D5700 Hb-SS disease with crisis, unspecified: Secondary | ICD-10-CM | POA: Diagnosis not present

## 2020-09-06 DIAGNOSIS — Z720 Tobacco use: Secondary | ICD-10-CM | POA: Diagnosis not present

## 2020-09-06 DIAGNOSIS — R45851 Suicidal ideations: Secondary | ICD-10-CM

## 2020-09-06 LAB — BASIC METABOLIC PANEL
Anion gap: 6 (ref 5–15)
BUN: 19 mg/dL (ref 6–20)
CO2: 21 mmol/L — ABNORMAL LOW (ref 22–32)
Calcium: 8.5 mg/dL — ABNORMAL LOW (ref 8.9–10.3)
Chloride: 109 mmol/L (ref 98–111)
Creatinine, Ser: 1.36 mg/dL — ABNORMAL HIGH (ref 0.61–1.24)
GFR, Estimated: >60 mL/min (ref >60)
Glucose, Bld: 87 mg/dL (ref 70–99)
Potassium: 3.9 mmol/L (ref 3.5–5.1)
Sodium: 136 mmol/L (ref 135–145)

## 2020-09-06 LAB — CBC
HCT: 16.9 % — ABNORMAL LOW (ref 39.0–52.0)
HCT: 22.2 % — ABNORMAL LOW (ref 39.0–52.0)
Hemoglobin: 6 g/dL — CL (ref 13.0–17.0)
Hemoglobin: 8 g/dL — ABNORMAL LOW (ref 13.0–17.0)
MCH: 30.9 pg (ref 26.0–34.0)
MCH: 31.6 pg (ref 26.0–34.0)
MCHC: 35.5 g/dL (ref 30.0–36.0)
MCHC: 36 g/dL (ref 30.0–36.0)
MCV: 87.1 fL (ref 80.0–100.0)
MCV: 87.7 fL (ref 80.0–100.0)
Platelets: 136 10*3/uL — ABNORMAL LOW (ref 150–400)
Platelets: 141 10*3/uL — ABNORMAL LOW (ref 150–400)
RBC: 1.94 MIL/uL — ABNORMAL LOW (ref 4.22–5.81)
RBC: 2.53 MIL/uL — ABNORMAL LOW (ref 4.22–5.81)
RDW: 25.2 % — ABNORMAL HIGH (ref 11.5–15.5)
RDW: 22.1 % — ABNORMAL HIGH (ref 11.5–15.5)
WBC: 10.1 10*3/uL (ref 4.0–10.5)
WBC: 7.8 10*3/uL (ref 4.0–10.5)
nRBC: 14.7 % — ABNORMAL HIGH (ref 0.0–0.2)
nRBC: 20.3 % — ABNORMAL HIGH (ref 0.0–0.2)

## 2020-09-06 LAB — PREPARE RBC (CROSSMATCH)

## 2020-09-06 MED ORDER — SERTRALINE HCL 25 MG PO TABS
25.0000 mg | ORAL_TABLET | Freq: Every day | ORAL | Status: DC
  Administered 2020-09-06 – 2020-09-13 (×8): 25 mg via ORAL
  Filled 2020-09-06 (×8): qty 1

## 2020-09-06 MED ORDER — ACETAMINOPHEN 325 MG PO TABS
650.0000 mg | ORAL_TABLET | Freq: Once | ORAL | Status: AC
  Administered 2020-09-06: 650 mg via ORAL
  Filled 2020-09-06: qty 2

## 2020-09-06 MED ORDER — SODIUM CHLORIDE 0.9% IV SOLUTION: Freq: Once | INTRAVENOUS | Status: AC

## 2020-09-06 MED ORDER — DIPHENHYDRAMINE HCL 50 MG/ML IJ SOLN
25.0000 mg | Freq: Once | INTRAMUSCULAR | Status: AC
  Administered 2020-09-06: 25 mg via INTRAVENOUS
  Filled 2020-09-06: qty 1

## 2020-09-06 NOTE — Progress Notes (Signed)
Subjective: Scott Norton is a 54 year old male with a medical history significant for sickle cell disease type SS was admitted for sickle cell pain crisis.  Today, patient's hemoglobin is 6.0, which is slightly increased from previous.  Patient is status post 1 unit PRBCs.  He continues to complain of some pain primarily to low back, upper and lower extremities.  He denies any headache, chest pain, urinary symptoms, nausea, vomiting, or diarrhea.  Objective:  Vital signs in last 24 hours:  Vitals:   09/06/20 1426 09/06/20 1455 09/06/20 1654 09/06/20 1722  BP: 137/86 135/74  (!) 142/82  Pulse: 73 61  69  Resp: 20 16 17 18   Temp: 98.2 F (36.8 C) 98.2 F (36.8 C)  98.1 F (36.7 C)  TempSrc: Oral   Oral  SpO2: 92%  91% 94%  Weight:      Height:        Intake/Output from previous day:   Intake/Output Summary (Last 24 hours) at 09/06/2020 1830 Last data filed at 09/06/2020 1650 Gross per 24 hour  Intake 630 ml  Output 1400 ml  Net -770 ml    Physical Exam: General: Alert, awake, oriented x3, in no acute distress.  HEENT: [place]/AT PEERL, EOMI.  Scleral icterus Neck: Trachea midline,  no masses, no thyromegal,y no JVD, no carotid bruit OROPHARYNX:  Moist, No exudate/ erythema/lesions.  Heart: Regular rate and rhythm, without murmurs, rubs, gallops, PMI non-displaced, no heaves or thrills on palpation.  Lungs: Clear to auscultation, no wheezing or rhonchi noted. No increased vocal fremitus resonant to percussion  Abdomen: Soft, nontender, nondistended, positive bowel sounds, no masses no hepatosplenomegaly noted..  Neuro: No focal neurological deficits noted cranial nerves II through XII grossly intact. DTRs 2+ bilaterally upper and lower extremities. Strength 5 out of 5 in bilateral upper and lower extremities. Musculoskeletal: No warm swelling or erythema around joints, no spinal tenderness noted. Psychiatric: Patient alert and oriented x3, good insight and cognition, good recent  to remote recall. Lymph node survey: No cervical axillary or inguinal lymphadenopathy noted.  Lab Results:  Basic Metabolic Panel:    Component Value Date/Time   NA 136 09/06/2020 0625   K 3.9 09/06/2020 0625   CL 109 09/06/2020 0625   CO2 21 (L) 09/06/2020 0625   BUN 19 09/06/2020 0625   CREATININE 1.36 (H) 09/06/2020 0625   CREATININE 0.90 07/19/2014 1128   GLUCOSE 87 09/06/2020 0625   CALCIUM 8.5 (L) 09/06/2020 0625   CBC:    Component Value Date/Time   WBC 7.8 09/06/2020 1714   HGB 8.0 (L) 09/06/2020 1714   HCT 22.2 (L) 09/06/2020 1714   PLT 141 (L) 09/06/2020 1714   MCV 87.7 09/06/2020 1714   NEUTROABS 2.5 09/04/2020 1714   LYMPHSABS 2.9 09/04/2020 1714   MONOABS 0.8 09/04/2020 1714   EOSABS 0.2 09/04/2020 1714   BASOSABS 0.0 09/04/2020 1714    Recent Results (from the past 240 hour(s))  Respiratory Panel by RT PCR (Flu A&B, Covid) - Nasopharyngeal Swab     Status: None   Collection Time: 09/04/20  8:03 PM   Specimen: Nasopharyngeal Swab  Result Value Ref Range Status   SARS Coronavirus 2 by RT PCR NEGATIVE NEGATIVE Final    Comment: (NOTE) SARS-CoV-2 target nucleic acids are NOT DETECTED.  The SARS-CoV-2 RNA is generally detectable in upper respiratoy specimens during the acute phase of infection. The lowest concentration of SARS-CoV-2 viral copies this assay can detect is 131 copies/mL. A negative result does not preclude  SARS-Cov-2 infection and should not be used as the sole basis for treatment or other patient management decisions. A negative result may occur with  improper specimen collection/handling, submission of specimen other than nasopharyngeal swab, presence of viral mutation(s) within the areas targeted by this assay, and inadequate number of viral copies (<131 copies/mL). A negative result must be combined with clinical observations, patient history, and epidemiological information. The expected result is Negative.  Fact Sheet for Patients:   https://www.moore.com/  Fact Sheet for Healthcare Providers:  https://www.young.biz/  This test is no t yet approved or cleared by the [place] FDA and  has been authorized for detection and/or diagnosis of SARS-CoV-2 by FDA under an Emergency Use Authorization (EUA). This EUA will remain  in effect (meaning this test can be used) for the duration of the COVID-19 declaration under Section 564(b)(1) of the Act, 21 U.S.C. section 360bbb-3(b)(1), unless the authorization is terminated or revoked sooner.     Influenza A by PCR NEGATIVE NEGATIVE Final   Influenza B by PCR NEGATIVE NEGATIVE Final    Comment: (NOTE) The Xpert Xpress SARS-CoV-2/FLU/RSV assay is intended as an aid in  the diagnosis of influenza from Nasopharyngeal swab specimens and  should not be used as a sole basis for treatment. Nasal washings and  aspirates are unacceptable for Xpert Xpress SARS-CoV-2/FLU/RSV  testing.  Fact Sheet for Patients: https://www.moore.com/  Fact Sheet for Healthcare Providers: https://www.young.biz/  This test is not yet approved or cleared by the [place] FDA and  has been authorized for detection and/or diagnosis of SARS-CoV-2 by  FDA under an Emergency Use Authorization (EUA). This EUA will remain  in effect (meaning this test can be used) for the duration of the  Covid-19 declaration under Section 564(b)(1) of the Act, 21  U.S.C. section 360bbb-3(b)(1), unless the authorization is  terminated or revoked. Performed at [facility], [address] [place], [place] [address]     Studies/Results: No results found.  Medications: Scheduled Meds:  HYDROmorphone   Intravenous Q4H   ketorolac  15 mg Intravenous Q6H   nicotine  14 mg Transdermal Daily   sertraline  25 mg Oral Daily   Continuous Infusions: PRN Meds:.diphenhydrAMINE, naloxone **AND** sodium chloride flush,  oxyCODONE-acetaminophen **AND** oxyCODONE, promethazine, senna-docusate, traZODone  Consultants:  Psychiatry  Transition of care  Procedures:  None  Antibiotics:  None  Assessment/Plan: Principal Problem:   Sickle cell pain crisis (HCC) Active Problems:   Sickle cell anemia (HCC)   Verbalizes suicidal thoughts   Tobacco use  Sickle cell pain crisis: Decrease IV fluids to KVO.  No changes to IV Dilaudid PCA, continue full dose.  Continue Toradol 15 mg IV every 6 hours for total of 5 days.  Monitor vital signs closely, reevaluate pain scale regularly, and supplemental oxygen as needed.  Sickle cell anemia: Today, patient's hemoglobin is 6.0.  Patient is s/p 1 unit PRBCs.  Transfuse 1 additional unit.  Follow CBC.  Chronic pain syndrome: Hold Percocet, use PCA Dilaudid  Tobacco dependence: Smoking cessation instruction/counseling given:  counseled patient on the dangers of tobacco use, advised patient to stop smoking, and reviewed strategies to maximize success   Depression: Patient has significant depression and verbalized suicidal thoughts on admission.  Psychiatry consulted, recommended inpatient psychiatry following medical clearance.  Continue sertraline 25 mg daily  Homelessness: Transitional care has been consulted.  Also, [facility] has been contacted for assistance.  Code Status: Full Code. Family Communication: N/A Disposition Plan: Not yet  ready for discharge.  Patient continues to have 1: 1 sitter.  [name]  APRN, MSN, FNP-C Patient Care [facility] [address] [place], [place] [address] [phone]  If 5PM-8AM, please contact night-coverage.  09/06/2020, 6:30 PM  LOS: 2 days

## 2020-09-06 NOTE — TOC Initial Note (Signed)
Transition of Care [facility]) - Initial/Assessment Note    Patient Details  Name: Scott Norton MRN: [id number] Date of Birth: 07-May-1966  Transition [facility]) CM/SW Contact:    Bartholome Bill, RN Phone Number: 09/06/2020, 2:36 PM  Clinical Narrative:                  Attempted to see pt at bedside for homeless resources. Pt sleeping soundly at this time. Current disposition plan is for pt to go to inpt pysch hospital once medically cleared. Will see him tomorrow.       Activities of Daily Living Home Assistive Devices/Equipment: None (homeless) ADL Screening (condition at time of admission) Patient's cognitive ability adequate to safely complete daily activities?: Yes Is the patient deaf or have difficulty hearing?: No Does the patient have difficulty seeing, even when wearing glasses/contacts?: Yes (mild cataracts) Does the patient have difficulty concentrating, remembering, or making decisions?: No Patient able to express need for assistance with ADLs?: Yes Does the patient have difficulty dressing or bathing?: No Independently performs ADLs?: Yes (appropriate for developmental age) Does the patient have difficulty walking or climbing stairs?: No Weakness of Legs: None ("more fatigue" per patient) Weakness of Arms/Hands: None     Admission diagnosis:  Sickle cell pain crisis (HCC) [D57.00] Anemia, unspecified type [D64.9] Patient Active Problem List   Diagnosis Date Noted  . Verbalizes suicidal thoughts 09/04/2020  . Tobacco use 09/04/2020  . Sickle cell pain crisis (HCC) 04/11/2015  . Respiratory infection 08/13/2012  . Healthcare maintenance 06/04/2012  . Cervical strain, acute 12/09/2011  . Sickle cell anemia (HCC) 11/29/2011  . Seasonal allergies 11/29/2011   PCP:  Patient, No Pcp Per Pharmacy:   [facility] - [place], [place] - [address]  [place] [address] Phone: [phone] Fax:  [phone]  [facility] - [place], [place] - [address] [place] [address] [place] [address] Phone: [phone] Fax: [phone]     Social Determinants of Health (SDOH) Interventions    Readmission Risk Interventions No flowsheet data found.

## 2020-09-07 DIAGNOSIS — D5700 Hb-SS disease with crisis, unspecified: Secondary | ICD-10-CM | POA: Diagnosis not present

## 2020-09-07 LAB — TYPE AND SCREEN
ABO/RH(D): O POS
Antibody Screen: NEGATIVE
Donor AG Type: NEGATIVE
Donor AG Type: NEGATIVE
Donor AG Type: NEGATIVE
Unit division: 0
Unit division: 0
Unit division: 0

## 2020-09-07 LAB — CBC
HCT: 24.9 % — ABNORMAL LOW (ref 39.0–52.0)
Hemoglobin: 8.8 g/dL — ABNORMAL LOW (ref 13.0–17.0)
MCH: 31.1 pg (ref 26.0–34.0)
MCHC: 35.3 g/dL (ref 30.0–36.0)
MCV: 88 fL (ref 80.0–100.0)
Platelets: 143 10*3/uL — ABNORMAL LOW (ref 150–400)
RBC: 2.83 MIL/uL — ABNORMAL LOW (ref 4.22–5.81)
RDW: 23.5 % — ABNORMAL HIGH (ref 11.5–15.5)
WBC: 10.7 10*3/uL — ABNORMAL HIGH (ref 4.0–10.5)
nRBC: 14 % — ABNORMAL HIGH (ref 0.0–0.2)

## 2020-09-07 LAB — BPAM RBC
Blood Product Expiration Date: 202112132359
Blood Product Expiration Date: 202112172359
Blood Product Expiration Date: 202112202359
ISSUE DATE / TIME: 202111151433
ISSUE DATE / TIME: 202111161135
ISSUE DATE / TIME: 202111161430
Unit Type and Rh: 5100
Unit Type and Rh: 5100
Unit Type and Rh: 9500

## 2020-09-07 NOTE — TOC Initial Note (Signed)
Transition of Care [facility]) - Initial/Assessment Note    Patient Details  Name: Scott Norton MRN: [id number] Date of Birth: [birth year]  Transition of Care [facility]) CM/SW Contact:    Bartholome Bill, RN Phone Number: 09/07/2020, 2:56 PM  Clinical Narrative:                 Pt is recommended for inpt psych placement once medically stable. [facility] CSW contacted to have pt looked at for bed. Per MD notes pt is not medically stable for dc yet. He is still using PCA. [facility] will be contacted again once pt is medically stable for dc.  TOC consult for homelessness. Pt states he has been staying in hotels but he has been out of money since Sunday because his check has run out. Spoke with pt about [facility]. Will place number for Debbie from [facility] on [facility].  Activities of Daily Living Home Assistive Devices/Equipment: None (homeless) ADL Screening (condition at time of admission) Patient's cognitive ability adequate to safely complete daily activities?: Yes Is the patient deaf or have difficulty hearing?: No Does the patient have difficulty seeing, even when wearing glasses/contacts?: Yes (mild cataracts) Does the patient have difficulty concentrating, remembering, or making decisions?: No Patient able to express need for assistance with ADLs?: Yes Does the patient have difficulty dressing or bathing?: No Independently performs ADLs?: Yes (appropriate for developmental age) Does the patient have difficulty walking or climbing stairs?: No Weakness of Legs: None ("more fatigue" per patient) Weakness of Arms/Hands: None     Admission diagnosis:  Sickle cell pain crisis (HCC) [D57.00] Anemia, unspecified type [D64.9] Patient Active Problem List   Diagnosis Date Noted  . Verbalizes suicidal thoughts 09/04/2020  . Tobacco use 09/04/2020  . Sickle cell pain crisis (HCC) 04/11/2015  . Respiratory infection 08/13/2012  . Healthcare maintenance 06/04/2012  . Cervical strain, acute 12/09/2011   . Sickle cell anemia (HCC) 11/29/2011  . Seasonal allergies 11/29/2011   PCP:  Patient, No Pcp Per Pharmacy:   [facility] - [place], [place] - [address] [place] [address] Phone: [phone] Fax: [phone]  [facility] - [place], [place] - [address] [place] [address] [place] [address] Phone: [phone] Fax: [phone]     Social Determinants of Health (SDOH) Interventions    Readmission Risk Interventions No flowsheet data found.

## 2020-09-07 NOTE — Care Management Important Message (Signed)
Important Message  Patient Details IM Letter given to the Patient. Name: Archit Leger MRN: [id number] Date of Birth: 1966-08-30   Medicare Important Message Given:  Yes     Caren Macadam 09/07/2020, 2:10 PM

## 2020-09-07 NOTE — Progress Notes (Signed)
Subjective: Scott Norton is a 54 year old male with a medical history significant for sickle cell disease type SS that was admitted for sickle cell pain crisis.  Patient states that pain is much improved.  Pain intensity is 5/10 primarily to low back.  Patient has not been out of bed.  Encouraged to mobilize today.  Patient's hemoglobin is 8.8 today, he is status post 2 units PRBCs. He denies any headache, chest pain, shortness of breath, urinary symptoms, nausea, vomiting, or diarrhea.  He also denies suicidal or homicidal ideations today.  Patient continues to have a 1: 1 sitter.  Objective:  Vital signs in last 24 hours:  Vitals:   09/07/20 0753 09/07/20 0828 09/07/20 1138 09/07/20 1210  BP:  130/81  129/73  Pulse:  62  (!) 54  Resp: 18 20 17 18   Temp:  98.3 F (36.8 C)  98.6 F (37 C)  TempSrc:  Oral  Oral  SpO2: 94% 94% 92% 90%  Weight:      Height:        Intake/Output from previous day:   Intake/Output Summary (Last 24 hours) at 09/07/2020 1221 Last data filed at 09/07/2020 0526 Gross per 24 hour  Intake 630 ml  Output 1400 ml  Net -770 ml    Physical Exam: General: Alert, awake, oriented x3, in no acute distress.  HEENT: [place]/AT PEERL, EOMI Neck: Trachea midline,  no masses, no thyromegal,y no JVD, no carotid bruit OROPHARYNX:  Moist, No exudate/ erythema/lesions.  Heart: Regular rate and rhythm, without murmurs, rubs, gallops, PMI non-displaced, no heaves or thrills on palpation.  Lungs: Clear to auscultation, no wheezing or rhonchi noted. No increased vocal fremitus resonant to percussion  Abdomen: Soft, nontender, nondistended, positive bowel sounds, no masses no hepatosplenomegaly noted..  Neuro: No focal neurological deficits noted cranial nerves II through XII grossly intact. DTRs 2+ bilaterally upper and lower extremities. Strength 5 out of 5 in bilateral upper and lower extremities. Musculoskeletal: No warm swelling or erythema around joints, no spinal  tenderness noted. Psychiatric: Patient alert and oriented x3, good insight and cognition, good recent to remote recall. Lymph node survey: No cervical axillary or inguinal lymphadenopathy noted.  Lab Results:  Basic Metabolic Panel:    Component Value Date/Time   NA 136 09/06/2020 0625   K 3.9 09/06/2020 0625   CL 109 09/06/2020 0625   CO2 21 (L) 09/06/2020 0625   BUN 19 09/06/2020 0625   CREATININE 1.36 (H) 09/06/2020 0625   CREATININE 0.90 07/19/2014 1128   GLUCOSE 87 09/06/2020 0625   CALCIUM 8.5 (L) 09/06/2020 0625   CBC:    Component Value Date/Time   WBC 10.7 (H) 09/07/2020 0752   HGB 8.8 (L) 09/07/2020 0752   HCT 24.9 (L) 09/07/2020 0752   PLT 143 (L) 09/07/2020 0752   MCV 88.0 09/07/2020 0752   NEUTROABS 2.5 09/04/2020 1714   LYMPHSABS 2.9 09/04/2020 1714   MONOABS 0.8 09/04/2020 1714   EOSABS 0.2 09/04/2020 1714   BASOSABS 0.0 09/04/2020 1714    Recent Results (from the past 240 hour(s))  Respiratory Panel by RT PCR (Flu A&B, Covid) - Nasopharyngeal Swab     Status: None   Collection Time: 09/04/20  8:03 PM   Specimen: Nasopharyngeal Swab  Result Value Ref Range Status   SARS Coronavirus 2 by RT PCR NEGATIVE NEGATIVE Final    Comment: (NOTE) SARS-CoV-2 target nucleic acids are NOT DETECTED.  The SARS-CoV-2 RNA is generally detectable in upper respiratoy specimens during the acute  phase of infection. The lowest concentration of SARS-CoV-2 viral copies this assay can detect is 131 copies/mL. A negative result does not preclude SARS-Cov-2 infection and should not be used as the sole basis for treatment or other patient management decisions. A negative result may occur with  improper specimen collection/handling, submission of specimen other than nasopharyngeal swab, presence of viral mutation(s) within the areas targeted by this assay, and inadequate number of viral copies (<131 copies/mL). A negative result must be combined with clinical observations,  patient history, and epidemiological information. The expected result is Negative.  Fact Sheet for Patients:  https://www.moore.com/  Fact Sheet for Healthcare Providers:  https://www.young.biz/  This test is no t yet approved or cleared by the [place] FDA and  has been authorized for detection and/or diagnosis of SARS-CoV-2 by FDA under an Emergency Use Authorization (EUA). This EUA will remain  in effect (meaning this test can be used) for the duration of the COVID-19 declaration under Section 564(b)(1) of the Act, 21 U.S.C. section 360bbb-3(b)(1), unless the authorization is terminated or revoked sooner.     Influenza A by PCR NEGATIVE NEGATIVE Final   Influenza B by PCR NEGATIVE NEGATIVE Final    Comment: (NOTE) The Xpert Xpress SARS-CoV-2/FLU/RSV assay is intended as an aid in  the diagnosis of influenza from Nasopharyngeal swab specimens and  should not be used as a sole basis for treatment. Nasal washings and  aspirates are unacceptable for Xpert Xpress SARS-CoV-2/FLU/RSV  testing.  Fact Sheet for Patients: https://www.moore.com/  Fact Sheet for Healthcare Providers: https://www.young.biz/  This test is not yet approved or cleared by the [place] FDA and  has been authorized for detection and/or diagnosis of SARS-CoV-2 by  FDA under an Emergency Use Authorization (EUA). This EUA will remain  in effect (meaning this test can be used) for the duration of the  Covid-19 declaration under Section 564(b)(1) of the Act, 21  U.S.C. section 360bbb-3(b)(1), unless the authorization is  terminated or revoked. Performed at [facility], [address] [place], [place] [address]     Studies/Results: No results found.  Medications: Scheduled Meds: . HYDROmorphone   Intravenous Q4H  . ketorolac  15 mg Intravenous Q6H  . nicotine  14 mg Transdermal Daily  . sertraline   25 mg Oral Daily   Continuous Infusions: PRN Meds:.diphenhydrAMINE, naloxone **AND** sodium chloride flush, oxyCODONE-acetaminophen **AND** oxyCODONE, promethazine, senna-docusate, traZODone  Consultants:  Psychiatry  Procedures:  None  Antibiotics:  None  Assessment/Plan: Principal Problem:   Sickle cell pain crisis (HCC) Active Problems:   Sickle cell anemia (HCC)   Verbalizes suicidal thoughts   Tobacco use  Sickle cell pain crisis: Continue IV fluids at [facility].  Continue full dose PCA Dilaudid. Toradol 15 mg IV every 6 hours for total of 5 days. Percocet 10-3 25 every 4 hours as needed for severe breakthrough pain Monitor vital signs closely, reevaluate pain scale regularly, and supplemental oxygen as needed.  Sickle cell anemia: Patient's hemoglobin is 8.8.  He is s/p 2 units PRBCs.  Continue to follow closely.  Repeat CBC in a.m.  Chronic pain syndrome: Continue home medications  Tobacco dependence: Smoking cessation instruction/counseling given:  counseled patient on the dangers of tobacco use, advised patient to stop smoking, and reviewed strategies to maximize success.  Depression: Patient has significant depression and verbalized suicidal thoughts on admission.  Psychiatry consulted and recommended inpatient psychiatry to follow medical clearance.  Continue sertraline 25 mg daily  Homelessness: Transitional care consulted.  Also, [facility] contacted for assistance   Code Status: Full Code Family Communication: N/A Disposition Plan: Not yet ready for discharge   Lex Linhares Rennis Petty  APRN, MSN, FNP-C Patient Care Center [facility] [address] [place], [place] [address] [phone]  If 5PM-7AM, please contact night-coverage.  09/07/2020, 12:21 PM  LOS: 3 days

## 2020-09-08 DIAGNOSIS — D5700 Hb-SS disease with crisis, unspecified: Secondary | ICD-10-CM | POA: Diagnosis not present

## 2020-09-08 LAB — CBC
HCT: 22.1 % — ABNORMAL LOW (ref 39.0–52.0)
Hemoglobin: 7.9 g/dL — ABNORMAL LOW (ref 13.0–17.0)
MCH: 31.2 pg (ref 26.0–34.0)
MCHC: 35.7 g/dL (ref 30.0–36.0)
MCV: 87.4 fL (ref 80.0–100.0)
Platelets: 141 10*3/uL — ABNORMAL LOW (ref 150–400)
RBC: 2.53 MIL/uL — ABNORMAL LOW (ref 4.22–5.81)
RDW: 23.9 % — ABNORMAL HIGH (ref 11.5–15.5)
WBC: 8.6 10*3/uL (ref 4.0–10.5)
nRBC: 9.6 % — ABNORMAL HIGH (ref 0.0–0.2)

## 2020-09-08 LAB — HGB FRAC BY HPLC+SOLUBILITY
Hgb A: 0 % — ABNORMAL LOW (ref 96.4–98.8)
Hgb C: 0 %
Hgb E: 0 %
Hgb F: 3.7 % — ABNORMAL HIGH (ref 0.0–2.0)
Hgb S: 93.3 % — ABNORMAL HIGH
Hgb Solubility: POSITIVE — AB
Hgb Variant: 0 %

## 2020-09-08 LAB — BASIC METABOLIC PANEL
Anion gap: 7 (ref 5–15)
BUN: 14 mg/dL (ref 6–20)
CO2: 24 mmol/L (ref 22–32)
Calcium: 9 mg/dL (ref 8.9–10.3)
Chloride: 108 mmol/L (ref 98–111)
Creatinine, Ser: 1.2 mg/dL (ref 0.61–1.24)
GFR, Estimated: >60 mL/min (ref >60)
Glucose, Bld: 91 mg/dL (ref 70–99)
Potassium: 4.4 mmol/L (ref 3.5–5.1)
Sodium: 139 mmol/L (ref 135–145)

## 2020-09-08 LAB — HGB FRACTIONATION CASCADE: Hgb A2: 3 % (ref 1.8–3.2)

## 2020-09-08 NOTE — Progress Notes (Signed)
Subjective: Scott Norton is a 54 year old male with a medical history significant for sickle cell disease type SS that was admitted for sickle cell pain crisis.  Patient says the pain continues to improve.  He has no new complaints on today.  He endorses pain to low back and mid chest.  He denies any headache, shortness of breath, urinary symptoms, nausea, vomiting, or diarrhea.  Patient also denies any suicidal or homicidal ideations today.  He continues to have a 1: 1 sitter.  Objective:  Vital signs in last 24 hours:  Vitals:   09/07/20 2348 09/08/20 0000 09/08/20 0400 09/08/20 0802  BP:  138/73    Pulse:  61    Resp: 19 20 16 16   Temp:  98.2 F (36.8 C)    TempSrc:  Oral    SpO2: 94% 93% 90% 91%  Weight:      Height:        Intake/Output from previous day:   Intake/Output Summary (Last 24 hours) at 09/08/2020 1051 Last data filed at 09/08/2020 0900 Gross per 24 hour  Intake 480 ml  Output 800 ml  Net -320 ml    Physical Exam: General: Alert, awake, oriented x3, in no acute distress.  HEENT: [place]/AT PEERL, EOMI Neck: Trachea midline,  no masses, no thyromegal,y no JVD, no carotid bruit OROPHARYNX:  Moist, No exudate/ erythema/lesions.  Heart: Regular rate and rhythm, without murmurs, rubs, gallops, PMI non-displaced, no heaves or thrills on palpation.  Lungs: Clear to auscultation, no wheezing or rhonchi noted. No increased vocal fremitus resonant to percussion  Abdomen: Soft, nontender, nondistended, positive bowel sounds, no masses no hepatosplenomegaly noted..  Neuro: No focal neurological deficits noted cranial nerves II through XII grossly intact. DTRs 2+ bilaterally upper and lower extremities. Strength 5 out of 5 in bilateral upper and lower extremities. Musculoskeletal: No warm swelling or erythema around joints, no spinal tenderness noted. Psychiatric: Patient alert and oriented x3, good insight and cognition, good recent to remote recall. Lymph node survey: No  cervical axillary or inguinal lymphadenopathy noted.  Lab Results:  Basic Metabolic Panel:    Component Value Date/Time   NA 136 09/06/2020 0625   K 3.9 09/06/2020 0625   CL 109 09/06/2020 0625   CO2 21 (L) 09/06/2020 0625   BUN 19 09/06/2020 0625   CREATININE 1.36 (H) 09/06/2020 0625   CREATININE 0.90 07/19/2014 1128   GLUCOSE 87 09/06/2020 0625   CALCIUM 8.5 (L) 09/06/2020 0625   CBC:    Component Value Date/Time   WBC 10.7 (H) 09/07/2020 0752   HGB 8.8 (L) 09/07/2020 0752   HCT 24.9 (L) 09/07/2020 0752   PLT 143 (L) 09/07/2020 0752   MCV 88.0 09/07/2020 0752   NEUTROABS 2.5 09/04/2020 1714   LYMPHSABS 2.9 09/04/2020 1714   MONOABS 0.8 09/04/2020 1714   EOSABS 0.2 09/04/2020 1714   BASOSABS 0.0 09/04/2020 1714    Recent Results (from the past 240 hour(s))  Respiratory Panel by RT PCR (Flu A&B, Covid) - Nasopharyngeal Swab     Status: None   Collection Time: 09/04/20  8:03 PM   Specimen: Nasopharyngeal Swab  Result Value Ref Range Status   SARS Coronavirus 2 by RT PCR NEGATIVE NEGATIVE Final    Comment: (NOTE) SARS-CoV-2 target nucleic acids are NOT DETECTED.  The SARS-CoV-2 RNA is generally detectable in upper respiratoy specimens during the acute phase of infection. The lowest concentration of SARS-CoV-2 viral copies this assay can detect is 131 copies/mL. A negative result does  not preclude SARS-Cov-2 infection and should not be used as the sole basis for treatment or other patient management decisions. A negative result may occur with  improper specimen collection/handling, submission of specimen other than nasopharyngeal swab, presence of viral mutation(s) within the areas targeted by this assay, and inadequate number of viral copies (<131 copies/mL). A negative result must be combined with clinical observations, patient history, and epidemiological information. The expected result is Negative.  Fact Sheet for Patients:   https://www.moore.com/  Fact Sheet for Healthcare Providers:  https://www.young.biz/  This test is no t yet approved or cleared by the [place] FDA and  has been authorized for detection and/or diagnosis of SARS-CoV-2 by FDA under an Emergency Use Authorization (EUA). This EUA will remain  in effect (meaning this test can be used) for the duration of the COVID-19 declaration under Section 564(b)(1) of the Act, 21 U.S.C. section 360bbb-3(b)(1), unless the authorization is terminated or revoked sooner.     Influenza A by PCR NEGATIVE NEGATIVE Final   Influenza B by PCR NEGATIVE NEGATIVE Final    Comment: (NOTE) The Xpert Xpress SARS-CoV-2/FLU/RSV assay is intended as an aid in  the diagnosis of influenza from Nasopharyngeal swab specimens and  should not be used as a sole basis for treatment. Nasal washings and  aspirates are unacceptable for Xpert Xpress SARS-CoV-2/FLU/RSV  testing.  Fact Sheet for Patients: https://www.moore.com/  Fact Sheet for Healthcare Providers: https://www.young.biz/  This test is not yet approved or cleared by the [place] FDA and  has been authorized for detection and/or diagnosis of SARS-CoV-2 by  FDA under an Emergency Use Authorization (EUA). This EUA will remain  in effect (meaning this test can be used) for the duration of the  Covid-19 declaration under Section 564(b)(1) of the Act, 21  U.S.C. section 360bbb-3(b)(1), unless the authorization is  terminated or revoked. Performed at [facility], [address], [place], [place] [address]     Studies/Results: No results found.  Medications: Scheduled Meds:  ketorolac  15 mg Intravenous Q6H   nicotine  14 mg Transdermal Daily   sertraline  25 mg Oral Daily   Continuous Infusions: PRN Meds:.oxyCODONE-acetaminophen **AND** oxyCODONE, senna-docusate,  traZODone  Consultants:  None  Procedures:  None  Antibiotics:  None  Assessment/Plan: Principal Problem:   Sickle cell pain crisis (HCC) Active Problems:   Sickle cell anemia (HCC)   Verbalizes suicidal thoughts   Tobacco use Sickle cell pain crisis: Discontinue IV Dilaudid PCA Toradol 15 mg IV every 6 hours for total of 5 days Continue Percocet 10-325 mg every 4 hours as needed for severe breakthrough pain Monitor vital signs closely, reevaluate pain scale regularly, and supplemental oxygen as needed.  Sickle cell anemia: Patient's hemoglobin is stable and consistent with his baseline.  He is s/p 2 units PRBCs.  Continue to follow closely.  CBC in a.m.  Chronic pain syndrome: Continue home medications  Tobacco dependence: Smoking cessation instruction/counseling given:  counseled patient on the dangers of tobacco use, advised patient to stop smoking, and reviewed strategies to maximize success   Depression:  Patient has significant depression and verbalized suicidal thoughts on admission.  Psychiatry was consulted and recommended inpatient psychiatry following medical clearance.  Continue sertraline 25 mg daily.  Homelessness: Transitional care and [facility] consulted for assistance.   Code Status: Full Code Family Communication: N/A Disposition Plan: Not yet ready for discharge  Breeze Berringer Rennis Petty  APRN, MSN, FNP-C Patient Care Center [facility] [address] [place], [place] [address] [phone]   If 5PM-7AM, please contact night-coverage.  09/08/2020, 10:51 AM  LOS: 4 days

## 2020-09-09 DIAGNOSIS — D5700 Hb-SS disease with crisis, unspecified: Secondary | ICD-10-CM | POA: Diagnosis not present

## 2020-09-09 LAB — CBC
HCT: 21.6 % — ABNORMAL LOW (ref 39.0–52.0)
Hemoglobin: 7.7 g/dL — ABNORMAL LOW (ref 13.0–17.0)
MCH: 30.8 pg (ref 26.0–34.0)
MCHC: 35.6 g/dL (ref 30.0–36.0)
MCV: 86.4 fL (ref 80.0–100.0)
Platelets: 141 10*3/uL — ABNORMAL LOW (ref 150–400)
RBC: 2.5 MIL/uL — ABNORMAL LOW (ref 4.22–5.81)
RDW: 23.3 % — ABNORMAL HIGH (ref 11.5–15.5)
WBC: 7.8 10*3/uL (ref 4.0–10.5)
nRBC: 8.2 % — ABNORMAL HIGH (ref 0.0–0.2)

## 2020-09-09 MED ORDER — LIP MEDEX EX OINT
TOPICAL_OINTMENT | CUTANEOUS | Status: DC | PRN
  Filled 2020-09-09: qty 7

## 2020-09-09 MED ORDER — LACTULOSE 10 GM/15ML PO SOLN
10.0000 g | Freq: Every day | ORAL | Status: DC | PRN
  Administered 2020-09-09 – 2020-09-11 (×3): 10 g via ORAL
  Filled 2020-09-09 (×3): qty 15

## 2020-09-09 NOTE — TOC Progression Note (Addendum)
Transition of Care [facility]) - Progression Note    Patient Details  Name: Scott Norton MRN: [id number] Date of Birth: [birth year]  Transition of Care [facility]) CM/SW Contact  Solange Emry, Meriam Sprague, RN Phone Number: 09/09/2020, 3:40 PM  Clinical Narrative:     [facility] was contacted today and alerted of medical stability. Awaiting bed availability. [facility] also contacted and they are looking at him as well.  Pt continues to be a voluntary admit to psych. When he was asked if he would be willing to go to a further location for psych treatment he states no at this time. TOC will continue to follow.       Social Determinants of Health (SDOH) Interventions    Readmission Risk Interventions No flowsheet data found.

## 2020-09-09 NOTE — Progress Notes (Signed)
Subjective: Scott Norton is a 54 year old male with a medical history significant for sickle cell disease type SS that was admitted for sickle cell pain crisis.  Patient's pain has improved further today.  Pain intensity is 3/10 primarily to low back and lower extremities.  Patient endorses some constipation.  He denies headache, chest pain, urinary symptoms, nausea, vomiting, or diarrhea.  Patient denies any suicidal homicidal ideations today.  He continues 1: 1 sitter.  Objective:  Vital signs in last 24 hours:  Vitals:   09/08/20 1739 09/08/20 2135 09/09/20 0610 09/09/20 1029  BP: (!) 149/75 136/76 131/72 139/87  Pulse: 68 66 72 (!) 50  Resp: 18 18 16 18   Temp: 98.4 F (36.9 C) 98.9 F (37.2 C) 98.4 F (36.9 C) (!) 97.4 F (36.3 C)  TempSrc: Oral Oral Oral Oral  SpO2: 100% 96% 94% 93%  Weight:      Height:        Intake/Output from previous day:   Intake/Output Summary (Last 24 hours) at 09/09/2020 1710 Last data filed at 09/09/2020 1126 Gross per 24 hour  Intake 120 ml  Output 200 ml  Net -80 ml    Physical Exam: General: Alert, awake, oriented x3, in no acute distress.  HEENT: [place]/AT PEERL, EOMI Neck: Trachea midline,  no masses, no thyromegal,y no JVD, no carotid bruit OROPHARYNX:  Moist, No exudate/ erythema/lesions.  Heart: Regular rate and rhythm, without murmurs, rubs, gallops, PMI non-displaced, no heaves or thrills on palpation.  Lungs: Clear to auscultation, no wheezing or rhonchi noted. No increased vocal fremitus resonant to percussion  Abdomen: Soft, nontender, nondistended, positive bowel sounds, no masses no hepatosplenomegaly noted..  Neuro: No focal neurological deficits noted cranial nerves II through XII grossly intact. DTRs 2+ bilaterally upper and lower extremities. Strength 5 out of 5 in bilateral upper and lower extremities. Musculoskeletal: No warm swelling or erythema around joints, no spinal tenderness noted. Psychiatric: Patient alert and  oriented x3, good insight and cognition, good recent to remote recall. Lymph node survey: No cervical axillary or inguinal lymphadenopathy noted.  Lab Results:  Basic Metabolic Panel:    Component Value Date/Time   NA 139 09/08/2020 1058   K 4.4 09/08/2020 1058   CL 108 09/08/2020 1058   CO2 24 09/08/2020 1058   BUN 14 09/08/2020 1058   CREATININE 1.20 09/08/2020 1058   CREATININE 0.90 07/19/2014 1128   GLUCOSE 91 09/08/2020 1058   CALCIUM 9.0 09/08/2020 1058   CBC:    Component Value Date/Time   WBC 7.8 09/09/2020 1052   HGB 7.7 (L) 09/09/2020 1052   HCT 21.6 (L) 09/09/2020 1052   PLT 141 (L) 09/09/2020 1052   MCV 86.4 09/09/2020 1052   NEUTROABS 2.5 09/04/2020 1714   LYMPHSABS 2.9 09/04/2020 1714   MONOABS 0.8 09/04/2020 1714   EOSABS 0.2 09/04/2020 1714   BASOSABS 0.0 09/04/2020 1714    Recent Results (from the past 240 hour(s))  Respiratory Panel by RT PCR (Flu A&B, Covid) - Nasopharyngeal Swab     Status: None   Collection Time: 09/04/20  8:03 PM   Specimen: Nasopharyngeal Swab  Result Value Ref Range Status   SARS Coronavirus 2 by RT PCR NEGATIVE NEGATIVE Final    Comment: (NOTE) SARS-CoV-2 target nucleic acids are NOT DETECTED.  The SARS-CoV-2 RNA is generally detectable in upper respiratoy specimens during the acute phase of infection. The lowest concentration of SARS-CoV-2 viral copies this assay can detect is 131 copies/mL. A negative result does not  preclude SARS-Cov-2 infection and should not be used as the sole basis for treatment or other patient management decisions. A negative result may occur with  improper specimen collection/handling, submission of specimen other than nasopharyngeal swab, presence of viral mutation(s) within the areas targeted by this assay, and inadequate number of viral copies (<131 copies/mL). A negative result must be combined with clinical observations, patient history, and epidemiological information. The expected result  is Negative.  Fact Sheet for Patients:  https://www.moore.com/  Fact Sheet for Healthcare Providers:  https://www.young.biz/  This test is no t yet approved or cleared by the [place] FDA and  has been authorized for detection and/or diagnosis of SARS-CoV-2 by FDA under an Emergency Use Authorization (EUA). This EUA will remain  in effect (meaning this test can be used) for the duration of the COVID-19 declaration under Section 564(b)(1) of the Act, 21 U.S.C. section 360bbb-3(b)(1), unless the authorization is terminated or revoked sooner.     Influenza A by PCR NEGATIVE NEGATIVE Final   Influenza B by PCR NEGATIVE NEGATIVE Final    Comment: (NOTE) The Xpert Xpress SARS-CoV-2/FLU/RSV assay is intended as an aid in  the diagnosis of influenza from Nasopharyngeal swab specimens and  should not be used as a sole basis for treatment. Nasal washings and  aspirates are unacceptable for Xpert Xpress SARS-CoV-2/FLU/RSV  testing.  Fact Sheet for Patients: https://www.moore.com/  Fact Sheet for Healthcare Providers: https://www.young.biz/  This test is not yet approved or cleared by the [place] FDA and  has been authorized for detection and/or diagnosis of SARS-CoV-2 by  FDA under an Emergency Use Authorization (EUA). This EUA will remain  in effect (meaning this test can be used) for the duration of the  Covid-19 declaration under Section 564(b)(1) of the Act, 21  U.S.C. section 360bbb-3(b)(1), unless the authorization is  terminated or revoked. Performed at [facility], [address], [place], [place] [address]     Studies/Results: No results found.  Medications: Scheduled Meds: . ketorolac  15 mg Intravenous Q6H  . nicotine  14 mg Transdermal Daily  . sertraline  25 mg Oral Daily   Continuous Infusions: PRN Meds:.lactulose, lip balm, oxyCODONE-acetaminophen  **AND** oxyCODONE, traZODone  Consultants:  None  Procedures:  None  Antibiotics:  None  Assessment/Plan: Principal Problem:   Sickle cell pain crisis (HCC) Active Problems:   Sickle cell anemia (HCC)   Verbalizes suicidal thoughts   Tobacco use Sickle cell pain crisis: Toradol 15 mg IV every 6 hours for total of 5 days Continue Percocet 10-325 mg every 4 hours as needed for severe breakthrough pain Monitor vital signs closely, reevaluate pain scale regularly, and supplemental oxygen as needed.  Sickle cell anemia: Patient's hemoglobin is stable and consistent with his baseline.  He is s/p 2 units PRBCs.  Continue to follow closely.  CBC in a.m.  Chronic pain syndrome: Continue home medications  Tobacco dependence: Smoking cessation instruction/counseling given:  counseled patient on the dangers of tobacco use, advised patient to stop smoking, and reviewed strategies to maximize success   Depression:  Patient has significant depression and verbalized suicidal thoughts on admission.  Psychiatry was consulted and recommended inpatient psychiatry following medical clearance.  Continue sertraline 25 mg daily.  Homelessness: Transitional care and [facility] consulted for assistance.   Code Status: Full Code Family Communication: N/A Disposition Plan: Not yet ready for discharge.  Awaiting placement.  Patient medically cleared.  [name]  APRN, MSN, FNP-C Patient Care  Center [facility] [address] [place], [place] [address] [phone]   If 5PM-7AM, please contact night-coverage.  09/09/2020, 5:10 PM  LOS: 5 days

## 2020-09-10 DIAGNOSIS — D5700 Hb-SS disease with crisis, unspecified: Secondary | ICD-10-CM | POA: Diagnosis not present

## 2020-09-10 NOTE — TOC Progression Note (Addendum)
Transition of Care [facility]) - Progression Note    Patient Details  Name: Scott Norton MRN: [id number] Date of Birth: 08-02-1966  Transition of Care [facility]) CM/SW Contact  Ida Rogue, [place] Phone Number: 09/10/2020, 8:37 AM  Clinical Narrative:   No beds at [facility] today.  Denied at [facility] as Sickle Cell makes them nervous. Left message for Lloyd Huger at [facility] [phone]. TOC will continue to follow during the course of hospitalization.  Addendum:  Sent chart to Lloyd Huger at [facility] at [email or web address] for review. Also messaged the MD with question of qualification for psych as NP note from last 3 days states that patient denies SI/HI.           Expected Discharge Plan and Services                                                 Social Determinants of Health (SDOH) Interventions    Readmission Risk Interventions No flowsheet data found.

## 2020-09-10 NOTE — Progress Notes (Signed)
Subjective: Patient doing better. No new complaint, not suicidal today. No Pain.   Objective: Vital signs in last 24 hours: Temp:  [97.4 F (36.3 C)-98.1 F (36.7 C)] 97.8 F (36.6 C) (11/20 0624) Pulse Rate:  [50-62] 62 (11/20 0624) Resp:  [16-18] 18 (11/20 0624) BP: (139-151)/(74-87) 151/80 (11/20 0624) SpO2:  [91 %-95 %] 95 % (11/20 0624) Weight change:  Last BM Date: 09/10/20  Intake/Output from previous day: 11/19 0701 - 11/20 0700 In: 240 [P.O.:240] Out: -  Intake/Output this shift: No intake/output data recorded.  General appearance: alert, cooperative, appears stated age and no distress Head: Normocephalic, without obvious abnormality, atraumatic Eyes: conjunctivae/corneas clear. PERRL, EOM's intact. Fundi benign. Throat: lips, mucosa, and tongue normal; teeth and gums normal Neck: no adenopathy, no carotid bruit, no JVD, supple, symmetrical, trachea midline and thyroid not enlarged, symmetric, no tenderness/mass/nodules Back: symmetric, no curvature. ROM normal. No CVA tenderness. Resp: clear to auscultation bilaterally Cardio: regular rate and rhythm, S1, S2 normal, no murmur, click, rub or gallop GI: soft, non-tender; bowel sounds normal; no masses,  no organomegaly Extremities: extremities normal, atraumatic, no cyanosis or edema Pulses: 2+ and symmetric Skin: Skin color, texture, turgor normal. No rashes or lesions Neurologic: Grossly normal  Lab Results: Recent Labs    09/08/20 1058 09/09/20 1052  WBC 8.6 7.8  HGB 7.9* 7.7*  HCT 22.1* 21.6*  PLT 141* 141*   BMET Recent Labs    09/08/20 1058  NA 139  K 4.4  CL 108  CO2 24  GLUCOSE 91  BUN 14  CREATININE 1.20  CALCIUM 9.0    Studies/Results: No results found.  Medications: I have reviewed the patient's current medications.  Assessment/Plan: 54 year old with history of sickle cell disease and suicidal ideation.  Patient admitted and being treated.  At this point he is awaiting referral to  inpatient psych.  #1 sickle cell painful crisis: Appears to have resolved.  Patient's pain is down to 2 out of 10.  We will continue with treatment until resolution.  #2 suicidal ideation: Patient is now saying he is not suicidal today.  He is still on suicide precaution with one-to-one sitter.  Will await reevaluation by psychiatry.  #3 anemia of chronic disease: H&H stable at 7.7.  #4 thrombocytopenia: Continue to monitor platelets.  #5 hypertension: Continue treatment  LOS: 6 days   Shikita Vaillancourt,LAWAL 09/10/2020, 9:14 AM

## 2020-09-11 ENCOUNTER — Inpatient Hospital Stay (HOSPITAL_COMMUNITY): Payer: Medicare Other

## 2020-09-11 DIAGNOSIS — D5700 Hb-SS disease with crisis, unspecified: Secondary | ICD-10-CM | POA: Diagnosis not present

## 2020-09-11 MED ORDER — POLYETHYLENE GLYCOL 3350 17 G PO PACK: 17.0000 g | PACK | Freq: Every day | ORAL | Status: DC | PRN

## 2020-09-11 MED ORDER — BISACODYL 5 MG PO TBEC
10.0000 mg | DELAYED_RELEASE_TABLET | Freq: Once | ORAL | Status: AC
  Administered 2020-09-11: 10 mg via ORAL
  Filled 2020-09-11: qty 2

## 2020-09-11 NOTE — Progress Notes (Signed)
Subjective: Patient doing better. Not suicidal. Awaiting transfer to Inpatient psych. Still depressed and worried.    Objective: Vital signs in last 24 hours: Temp:  [98 F (36.7 C)-98.4 F (36.9 C)] 98 F (36.7 C) (11/21 1044) Pulse Rate:  [55-79] 55 (11/21 1044) Resp:  [14-18] 14 (11/21 1039) BP: (130-153)/(63-77) 151/73 (11/21 1039) SpO2:  [86 %-93 %] 92 % (11/21 1044) Weight change:  Last BM Date: 09/10/20  Intake/Output from previous day: 11/20 0701 - 11/21 0700 In: 720 [P.O.:720] Out: -  Intake/Output this shift: Total I/O In: 360 [P.O.:360] Out: 200 [Urine:200]  General appearance: alert, cooperative, appears stated age and no distress Head: Normocephalic, without obvious abnormality, atraumatic Eyes: conjunctivae/corneas clear. PERRL, EOM's intact. Fundi benign. Throat: lips, mucosa, and tongue normal; teeth and gums normal Neck: no adenopathy, no carotid bruit, no JVD, supple, symmetrical, trachea midline and thyroid not enlarged, symmetric, no tenderness/mass/nodules Back: symmetric, no curvature. ROM normal. No CVA tenderness. Resp: clear to auscultation bilaterally Cardio: regular rate and rhythm, S1, S2 normal, no murmur, click, rub or gallop GI: soft, non-tender; bowel sounds normal; no masses,  no organomegaly Extremities: extremities normal, atraumatic, no cyanosis or edema Pulses: 2+ and symmetric Skin: Skin color, texture, turgor normal. No rashes or lesions Neurologic: Grossly normal  Lab Results: Recent Labs    09/09/20 1052  WBC 7.8  HGB 7.7*  HCT 21.6*  PLT 141*   BMET No results for input(s): NA, K, CL, CO2, GLUCOSE, BUN, CREATININE, CALCIUM in the last 72 hours.  Studies/Results: No results found.  Medications: I have reviewed the patient's current medications.  Assessment/Plan: 54 year old with history of sickle cell disease and suicidal ideation.  Patient admitted and being treated.  At this point he is awaiting referral to inpatient  psych.  #1 sickle cell painful crisis: Appears to have resolved.  Patient's pain is down to 2 out of 10.  We will continue with treatment until resolution.  #2 suicidal ideation: Patient is now saying he is not suicidal today.  He is still on suicide precaution with one-to-one sitter.  Will await reevaluation by psychiatry.  #3 anemia of chronic disease: H&H stable at 7.7.  #4 thrombocytopenia: Continue to monitor platelets.  #5 hypertension: Continue treatment   LOS: 7 days   Arleigh Odowd,LAWAL 09/11/2020, 12:26 PM

## 2020-09-11 NOTE — TOC Progression Note (Signed)
Transition of Care [facility]) - Progression Note    Patient Details  Name: Scott Norton MRN: [id number] Date of Birth: 08-Apr-1966  [facility]) CM/SW Contact  Coralyn Helling, [place] Phone Number: 09/11/2020, 12:02 PM  Clinical Narrative:   LCSW called [facility] (No availability) and [facility] (no answer). Did not fax patient out to additional facilities due to psych note dated 11/15. Patient has not been seen within 72 hours. Please consult psych for updated note/rec. Will notify attending.          Expected Discharge Plan and Services                                                 Social Determinants of Health (SDOH) Interventions    Readmission Risk Interventions No flowsheet data found.

## 2020-09-12 DIAGNOSIS — D5700 Hb-SS disease with crisis, unspecified: Secondary | ICD-10-CM | POA: Diagnosis not present

## 2020-09-12 NOTE — Care Management Important Message (Signed)
Important Message  Patient Details IM Letter given to the Patient. Name: Bryston Colocho MRN: [id number] Date of Birth: [birth year]   Medicare Important Message Given:  Yes     Caren Macadam 09/12/2020, 10:42 AM

## 2020-09-12 NOTE — Progress Notes (Signed)
Subjective: Scott Norton is a 54 year old male with a medical history significant for sickle cell disease type SS that was admitted for sickle cell pain crisis.  Patient's pain has improved.  He denies headache, chest pain, urinary symptoms, nausea, vomiting, or diarrhea.  Patient denies any suicidal or homicidal ideations today.    Objective:  Vital signs in last 24 hours:  Vitals:   09/12/20 0550 09/12/20 1002 09/12/20 1407 09/12/20 1552  BP: 133/68 (!) 147/77 (!) 148/86 137/77  Pulse: 70 65 61 69  Resp: 16 18 18 17   Temp: 98.2 F (36.8 C) 97.7 F (36.5 C) 98.2 F (36.8 C) 98.3 F (36.8 C)  TempSrc: Oral Oral Oral Oral  SpO2: 97% 100% 91% 94%  Weight:      Height:        Intake/Output from previous day:   Intake/Output Summary (Last 24 hours) at 09/12/2020 1939 Last data filed at 09/12/2020 1857 Gross per 24 hour  Intake 440 ml  Output --  Net 440 ml    Physical Exam: General: Alert, awake, oriented x3, in no acute distress.  HEENT: [place]/AT PEERL, EOMI Neck: Trachea midline,  no masses, no thyromegal,y no JVD, no carotid bruit OROPHARYNX:  Moist, No exudate/ erythema/lesions.  Heart: Regular rate and rhythm, without murmurs, rubs, gallops, PMI non-displaced, no heaves or thrills on palpation.  Lungs: Clear to auscultation, no wheezing or rhonchi noted. No increased vocal fremitus resonant to percussion  Abdomen: Soft, nontender, nondistended, positive bowel sounds, no masses no hepatosplenomegaly noted..  Neuro: No focal neurological deficits noted cranial nerves II through XII grossly intact. DTRs 2+ bilaterally upper and lower extremities. Strength 5 out of 5 in bilateral upper and lower extremities. Musculoskeletal: No warm swelling or erythema around joints, no spinal tenderness noted. Psychiatric: Patient alert and oriented x3, good insight and cognition, good recent to remote recall. Lymph node survey: No cervical axillary or inguinal lymphadenopathy noted.  Lab  Results:  Basic Metabolic Panel:    Component Value Date/Time   NA 139 09/08/2020 1058   K 4.4 09/08/2020 1058   CL 108 09/08/2020 1058   CO2 24 09/08/2020 1058   BUN 14 09/08/2020 1058   CREATININE 1.20 09/08/2020 1058   CREATININE 0.90 07/19/2014 1128   GLUCOSE 91 09/08/2020 1058   CALCIUM 9.0 09/08/2020 1058   CBC:    Component Value Date/Time   WBC 7.8 09/09/2020 1052   HGB 7.7 (L) 09/09/2020 1052   HCT 21.6 (L) 09/09/2020 1052   PLT 141 (L) 09/09/2020 1052   MCV 86.4 09/09/2020 1052   NEUTROABS 2.5 09/04/2020 1714   LYMPHSABS 2.9 09/04/2020 1714   MONOABS 0.8 09/04/2020 1714   EOSABS 0.2 09/04/2020 1714   BASOSABS 0.0 09/04/2020 1714    Recent Results (from the past 240 hour(s))  Respiratory Panel by RT PCR (Flu A&B, Covid) - Nasopharyngeal Swab     Status: None   Collection Time: 09/04/20  8:03 PM   Specimen: Nasopharyngeal Swab  Result Value Ref Range Status   SARS Coronavirus 2 by RT PCR NEGATIVE NEGATIVE Final    Comment: (NOTE) SARS-CoV-2 target nucleic acids are NOT DETECTED.  The SARS-CoV-2 RNA is generally detectable in upper respiratoy specimens during the acute phase of infection. The lowest concentration of SARS-CoV-2 viral copies this assay can detect is 131 copies/mL. A negative result does not preclude SARS-Cov-2 infection and should not be used as the sole basis for treatment or other patient management decisions. A negative result may occur  with  improper specimen collection/handling, submission of specimen other than nasopharyngeal swab, presence of viral mutation(s) within the areas targeted by this assay, and inadequate number of viral copies (<131 copies/mL). A negative result must be combined with clinical observations, patient history, and epidemiological information. The expected result is Negative.  Fact Sheet for Patients:  https://www.moore.com/  Fact Sheet for Healthcare Providers:   https://www.young.biz/  This test is no t yet approved or cleared by the [place] FDA and  has been authorized for detection and/or diagnosis of SARS-CoV-2 by FDA under an Emergency Use Authorization (EUA). This EUA will remain  in effect (meaning this test can be used) for the duration of the COVID-19 declaration under Section 564(b)(1) of the Act, 21 U.S.C. section 360bbb-3(b)(1), unless the authorization is terminated or revoked sooner.     Influenza A by PCR NEGATIVE NEGATIVE Final   Influenza B by PCR NEGATIVE NEGATIVE Final    Comment: (NOTE) The Xpert Xpress SARS-CoV-2/FLU/RSV assay is intended as an aid in  the diagnosis of influenza from Nasopharyngeal swab specimens and  should not be used as a sole basis for treatment. Nasal washings and  aspirates are unacceptable for Xpert Xpress SARS-CoV-2/FLU/RSV  testing.  Fact Sheet for Patients: https://www.moore.com/  Fact Sheet for Healthcare Providers: https://www.young.biz/  This test is not yet approved or cleared by the [place] FDA and  has been authorized for detection and/or diagnosis of SARS-CoV-2 by  FDA under an Emergency Use Authorization (EUA). This EUA will remain  in effect (meaning this test can be used) for the duration of the  Covid-19 declaration under Section 564(b)(1) of the Act, 21  U.S.C. section 360bbb-3(b)(1), unless the authorization is  terminated or revoked. Performed at [facility], [address], [place], [place] [address]     Studies/Results: DG Chest 2 View  Result Date: 09/11/2020 CLINICAL DATA:  Shortness of breath EXAM: CHEST - 2 VIEW COMPARISON:  08/13/2012 FINDINGS: There are small bilateral pleural effusions. There is mild cardiomegaly with vascular congestion and diffuse interstitial opacity, suspect for pulmonary edema. Aortic atherosclerosis. Basilar airspace disease, probable atelectasis. No  pneumothorax. IMPRESSION: Cardiomegaly with vascular congestion, small bilateral effusions and diffuse interstitial process favored to represent pulmonary edema. More confluent airspace disease at the bases, probable atelectasis. Electronically Signed   By: Jasmine Pang M.D.   On: 09/11/2020 16:06    Medications: Scheduled Meds: . nicotine  14 mg Transdermal Daily  . sertraline  25 mg Oral Daily   Continuous Infusions: PRN Meds:.lactulose, lip balm, oxyCODONE-acetaminophen **AND** oxyCODONE, polyethylene glycol, traZODone  Consultants:  None  Procedures:  None  Antibiotics:  None  Assessment/Plan: Principal Problem:   Sickle cell pain crisis (HCC) Active Problems:   Sickle cell anemia (HCC)   Verbalizes suicidal thoughts   Tobacco use  Sickle cell pain crisis: Resolved.  Continue Percocet 10-325 mg every 4 hours as needed for severe breakthrough pain Monitor vital signs closely, reevaluate pain scale regularly, and supplemental oxygen as needed.  Sickle cell anemia: Follow up with [facility] hematology as scheduled.   Chronic pain syndrome: Continue home medications  Tobacco dependence: Smoking cessation instruction/counseling given:  counseled patient on the dangers of tobacco use, advised patient to stop smoking, and reviewed strategies to maximize success   Depression:  Patient has significant depression and verbalized suicidal thoughts on admission.  Psychiatry was consulted previously and recommended inpatient psychiatry following medical clearance. Patient denies any suicidal or homicidal intent. He says that the 1:1  sitters have been very effective.   Continue sertraline 25 mg daily. Will consult psychiatry. Discontinue 1:1 sitter.   Homelessness: Transitional care and [facility] sickle-cell agency consulted for assistance.   Code Status: Full Code Family Communication: N/A Disposition Plan: Not yet ready for discharge.  Awaiting psych consult.  Patient  medically cleared.  [name]  APRN, MSN, FNP-C Patient Care [facility] [address] [place], [place] [address] [phone]   If 5PM-8AM, please contact night-coverage.  09/12/2020, 7:39 PM  LOS: 8 days

## 2020-09-12 NOTE — Consult Note (Signed)
[facility] Face-to-Face Psychiatry Consult   Reason for Consult:  Suicidal ideation Referring Physician:  Dr. Hyman Hopes Patient Identification: Scott Norton MRN:  [id number] Principal Diagnosis: Sickle cell pain crisis [facility]) Diagnosis:  Principal Problem:   Sickle cell pain crisis (HCC) Active Problems:   Sickle cell anemia (HCC)   Verbalizes suicidal thoughts   Tobacco use  Total Time spent with patient: 30 minutes  Subjective:   Scott Norton is a 54 y.o. male patient admitted with suicidal ideations. Today he reports improvement in his suicidal thoughts and depression. He contributes this to his positive interactions with medical team here. He also states his thoughts lasted for a couple days and that was it. Patient does appear to be ruminating about his housing and the need to be placed. "I have $10 where am I going to go with $10? Ginette Otto just got money for housing too. " He denies suicidal thoughts at this time.   HPI:  Scott Norton is a 54 y.o. male with medical history significant for sickle cell anemia who presents to ER with sickle cell pain crisis. Patient reports that he has been hurting in all of his extremities and back. He states that pain is similar to prior pain crises. Describes pain as intermittent sharp pains on achy pain. No nausea or vomiting, no fever. No chest pain or difficulty breathing. He states he had no aggravating event that led to pain. He has used percocet for pain at home but it has not helped as well as normal for past few days and ran out of percocet yesterday.   Past Psychiatric History: Depression. Currently not receiving any psychiatric services at this time. Denies any previous suicidal ideations or attempts.   Risk to Self: Is patient at risk for suicide?: YesYes Risk to Others:  Denies Prior Inpatient Therapy:  Denies Prior Outpatient Therapy:  Denies  Past Medical History:  Past Medical History:  Diagnosis Date  . Anemia   . Depression   . Heart  murmur   . Pneumonia    ?ACS requring intubation  . Seasonal allergies    wool & grass  . Sickle cell anemia (HCC)     Past Surgical History:  Procedure Laterality Date  . ROOT CANAL     Family History:  Family History  Problem Relation Age of Onset  . Alcohol abuse Mother   . Sickle cell trait Mother   . Hypertension Mother   . Alcohol abuse Father   . Sickle cell trait Father   . Early death Father        due to alcoholism  . Hypertension Father   . Hypertension Sister   . Hypertension Brother   . Hypertension Maternal Grandmother   . Hypertension Maternal Grandfather   . Hypertension Paternal Grandmother   . Hypertension Paternal Grandfather    Family Psychiatric  History: Noncontributory, was "given away by his mother."  Social History:  Social History   Substance and Sexual Activity  Alcohol Use Yes   Comment: occasional but indicated etoh abuse on health hx questionaire     Social History   Substance and Sexual Activity  Drug Use Not Currently    Social History   Socioeconomic History  . Marital status: Single    Spouse name: Not on file  . Number of children: Not on file  . Years of education: Not on file  . Highest education level: Not on file  Occupational History  . Not on file  Tobacco Use  .  Smoking status: Current Every Day Smoker    Packs/day: 0.50    Types: Cigars  . Smokeless tobacco: Never Used  Substance and Sexual Activity  . Alcohol use: Yes    Comment: occasional but indicated etoh abuse on health hx questionaire  . Drug use: Not Currently  . Sexual activity: Yes  Other Topics Concern  . Not on file  Social History Narrative   Lives with Friends in [place]. Working on housing for himself   On disability for his SS disease      No children, no stable relationship   Social Determinants of Corporate investment banker Strain:   . Difficulty of Paying Living Expenses: Not on file  Food Insecurity:   . Worried About Brewing technologist in the Last Year: Not on file  . Ran Out of Food in the Last Year: Not on file  Transportation Needs:   . Lack of Transportation (Medical): Not on file  . Lack of Transportation (Non-Medical): Not on file  Physical Activity:   . Days of Exercise per Week: Not on file  . Minutes of Exercise per Session: Not on file  Stress:   . Feeling of Stress : Not on file  Social Connections:   . Frequency of Communication with Friends and Family: Not on file  . Frequency of Social Gatherings with Friends and Family: Not on file  . Attends Religious Services: Not on file  . Active Member of Clubs or Organizations: Not on file  . Attends Banker Meetings: Not on file  . Marital Status: Not on file   Additional Social History:    Allergies:   Allergies  Allergen Reactions  . Aspirin Other (See Comments)    Increased heart rate  . Tramadol Hives and Itching    Labs:  No results found for this or any previous visit (from the past 48 hour(s)).  Current Facility-Administered Medications  Medication Dose Route Frequency Provider Last Rate Last Admin  . lactulose (CHRONULAC) 10 GM/15ML solution 10 g  10 g Oral Daily PRN Massie Maroon, FNP   10 g at 09/11/20 1036  . lip balm (CARMEX) ointment   Topical PRN Massie Maroon, FNP      . nicotine (NICODERM CQ - dosed in mg/24 hours) patch 14 mg  14 mg Transdermal Daily Chotiner, Claudean Severance, MD   14 mg at 09/07/20 0952  . oxyCODONE-acetaminophen (PERCOCET/ROXICET) 5-325 MG per tablet 1 tablet  1 tablet Oral Q4H PRN Chotiner, Claudean Severance, MD   1 tablet at 09/10/20 2231   And  . oxyCODONE (Oxy IR/ROXICODONE) immediate release tablet 5 mg  5 mg Oral Q4H PRN Chotiner, Claudean Severance, MD   5 mg at 09/10/20 2231  . polyethylene glycol (MIRALAX / GLYCOLAX) packet 17 g  17 g Oral Daily PRN Jimmye [facility], NP      . sertraline (ZOLOFT) tablet 25 mg  25 mg Oral Daily Jegede, Olugbemiga E, MD   25 mg at 09/12/20 1050  . traZODone  (DESYREL) tablet 50 mg  50 mg Oral QHS PRN Chotiner, Claudean Severance, MD   50 mg at 09/10/20 2231    Musculoskeletal: Strength & Muscle Tone: within normal limits Gait & Station: normal Patient leans: N/A  Psychiatric Specialty Exam: Physical Exam   Review of Systems   Blood pressure 137/77, pulse 69, temperature 98.3 F (36.8 C), temperature source Oral, resp. rate 17, height 6\' 3"  (1.905 m), weight 69.5 kg,  SpO2 94 %.Body mass index is 19.15 kg/m.  General Appearance: Casual  Eye Contact:  Good  Speech:  Clear and Coherent and Normal Rate  Volume:  Normal  Mood:  Euthymic  Affect:  Appropriate and Congruent  Thought Process:  Coherent, Linear and Descriptions of Associations: Intact  Orientation:  Full (Time, Place, and Person)  Thought Content:  Logical  Suicidal Thoughts:  No  Homicidal Thoughts:  No  Memory:  Immediate;   Fair Recent;   Fair  Judgement:  Intact  Insight:  Fair  Psychomotor Activity:  Normal  Concentration:  Concentration: Fair and Attention Span: Fair  Recall:  [employer] of Knowledge:  Fair  Language:  Fair  Akathisia:  No  Handed:  Right  AIMS (if indicated):     Assets:  Communication Skills Desire for Improvement Leisure Time  ADL's:  Intact  Cognition:  WNL  Sleep:        Treatment Plan Summary: Plan Wil psych clear at this time. Continue sertraline 25mg  po daily for depression. Patient can follow up with outpatient psychiatry at this tiem. He also needs SW to help with housing.   Disposition: No evidence of imminent risk to self or others at present.   Patient does not meet criteria for psychiatric inpatient admission. Supportive therapy provided about ongoing stressors.  , FNP 09/12/2020 5:09 PM

## 2020-09-13 ENCOUNTER — Other Ambulatory Visit (HOSPITAL_COMMUNITY): Payer: Self-pay | Admitting: Family Medicine

## 2020-09-13 DIAGNOSIS — D5700 Hb-SS disease with crisis, unspecified: Secondary | ICD-10-CM | POA: Diagnosis not present

## 2020-09-13 MED ORDER — IBUPROFEN 800 MG PO TABS: 800.0000 mg | ORAL_TABLET | Freq: Three times a day (TID) | ORAL | 0 refills | Status: DC

## 2020-09-13 MED ORDER — FOLIC ACID 1 MG PO TABS: 1.0000 mg | ORAL_TABLET | Freq: Every day | ORAL | 0 refills | Status: DC

## 2020-09-13 MED ORDER — HYDROCODONE-ACETAMINOPHEN 5-325 MG PO TABS
1.0000 | ORAL_TABLET | ORAL | 0 refills | Status: DC | PRN
Start: 2020-09-13 — End: 2020-09-14

## 2020-09-13 MED FILL — HYDROCODON-APAP 5-325: 5-325 | 4 days supply | Qty: 20 | Fill #0

## 2020-09-13 MED FILL — IBUPROFEN 800 MG TAB: 800 | 10 days supply | Qty: 30 | Fill #0

## 2020-09-13 NOTE — TOC Transition Note (Signed)
Transition of Care [facility]) - CM/SW Discharge Note   Patient Details  Name: Scott Norton MRN: [id number] Date of Birth: 04/11/1966  [facility] CM/SW Contact:  Bartholome Bill, RN Phone Number: 09/13/2020, 10:30 AM   Clinical Narrative:     Spoke with pt again about homeless resources. Pt is not interested in going to a Shelter. This CM also emailed Cherlynn June from [employer] for financial assistance for housing for pt. No return response at this time. Pt states he has a contact from [employer] Mindi Junker that he plans to contact today. Pt was also informed about the [facility] white flag shelter. At this time TOC is unable to provide further resources for housing. Pt is medically ready for DC and has been cleared by pysch.

## 2020-09-13 NOTE — Discharge Summary (Addendum)
Physician Discharge Summary  Scott Norton ZOX:[id number] DOB: 1966-09-10 DOA: 09/04/2020  PCP: Patient, No Pcp Per  Admit date: 09/04/2020  Discharge date: 09/13/2020  Discharge Diagnoses:  Principal Problem:   Sickle cell pain crisis (HCC) Active Problems:   Sickle cell anemia (HCC)   Verbalizes suicidal thoughts   Tobacco use   Discharge Condition: Stable  Disposition:   Follow-up Information    [facility] Follow up.   Why: [facility] can help you find a shelter bed. [phone]       [facility] Follow up.   Why: Day center with shower facilities, computer labs, mail room and laundry facility. They have a [facility] winter warming center Contact information: [address] [place], [place] [address]  [phone]             Pt is discharged home in good condition and is to establish care with PCP To have labs evaluated. Scott Norton is instructed to increase activity slowly and balance with rest for the next few days, and use prescribed medication to complete treatment of pain  Diet: Regular Wt Readings from Last 3 Encounters:  09/05/20 69.5 kg  06/03/20 67.3 kg  04/07/18 73.5 kg    History of present illness:  Scott Norton is a 54 year old man with a medical history significant for sickle cell anemia who presents to the ER with sickle cell pain crisis.  Patient reports that he has been hurting in all of his extremities and back.  He states that pain is similar to prior pain crises.  Describes pain as intermittent, sharp pains achy pain.  No nausea or vomiting, no fever.  No chest pain or difficulty breathing.  He states he had no aggravating event that led to pain.  He is use Percocet for pain at home but it has not helped as well as normal for the past few days and ran out of Percocet yesterday. Patient reports that he has had some general suicidal thoughts over the past month due to his current homeless situation and  feels hopeless.  He reports that he does not have an active SI and no intentions to complete self-harm.  States that he simply had thoughts that life has no meaning and he is not sure why he is alive.  ER course: Hemoglobin 6.5 in the ER.  Transfuse 1 unit packed red blood cells. Hospital Course:  Patient was admitted for sickle cell pain crisis and managed appropriately with IVF, IV Dilaudid via PCA and IV Toradol, as well as other adjunct therapies per sickle cell pain management protocols.  IV Dilaudid PCA was weaned appropriately.  Patient transition to Percocet 5-325 mg every 6 hours as needed.  He has no home medications.  Medication #20 was sent to patient's pharmacy to complete treatment.  Also, ibuprofen 800 mg #30 and folic acid 1 mg daily #30 was sent to patient's pharmacy.  Patient is clear for medical discharge.  Pain intensity is 2/10 primarily to low back.  Homelessness: Patient is currently homeless and has been residing and shelters and hotels over the past several months.  He states that he has no funding and does not have any shelter upon discharge.  Patient was referred to transition of care services who assisted in finding shelters.  Patient is currently not interested in group shelters due to past experiences.  Also, patient was connected with [facility] sickle-cell agency case manager Kandice Robinsons.  Ms. Thomasena Edis has been assisting patient in finding community  resources to help with his current living situation.  Sickle cell anemia: Hemoglobin decreased to 6.5, below patient's baseline.  Medical records reviewed, is followed by [facility] hematology and was last seen in August 2021.  Patient was transfused 2 units PRBCs without complication.  Hemoglobin 7.7 prior to discharge.  Patient advised to follow-up with [facility] hematology as scheduled.  Severe depression: Patient has severe depression and admission.  Psychiatry consulted and initially recommended inpatient behavioral  health.  However, patient continues to deny suicidal intent.  He does not have an active plan.  Psychiatry reconsulted and recommend discharge and outpatient behavioral services.  Patient will continue sertraline 25 mg daily #30 sent to pharmacy.  He will need to follow-up with PCP.  Given information to establish care.  Patient is alert, oriented, and ambulating without assistance.  Patient was therefore discharged home today in a hemodynamically stable condition.   Scott Norton will follow-up with PCP within 1 week of this discharge. Scott Norton was counseled extensively about nonpharmacologic means of pain management, patient verbalized understanding and was appreciative of  the care received during this admission.   We discussed the need for good hydration, monitoring of hydration status, avoidance of heat, cold, stress, and infection triggers. Patient was reminded of the need to seek medical attention immediately if any symptom of bleeding, anemia, or infection occurs.  Discharge Exam: Vitals:   09/13/20 0444 09/13/20 1113  BP: 127/67 (!) 132/58  Pulse: 80 66  Resp: 16 18  Temp: 98.2 F (36.8 C) 98.1 F (36.7 C)  SpO2: 90% 100%   Vitals:   09/12/20 2008 09/13/20 0023 09/13/20 0444 09/13/20 1113  BP: (!) 149/82 (!) 153/87 127/67 (!) 132/58  Pulse: 94 78 80 66  Resp: 16 16 16 18   Temp: 98.5 F (36.9 C) 98.3 F (36.8 C) 98.2 F (36.8 C) 98.1 F (36.7 C)  TempSrc: Oral Oral Oral Oral  SpO2: (!) 89% 100% 90% 100%  Weight:      Height:        General appearance : Awake, alert, not in any distress. Speech Clear. Not toxic looking HEENT: Atraumatic and Normocephalic, pupils equally reactive to light and accomodation Neck: Supple, no JVD. No cervical lymphadenopathy.  Chest: Good air entry bilaterally, no added sounds  CVS: S1 S2 regular, no murmurs.  Abdomen: Bowel sounds present, Non tender and not distended with no gaurding, rigidity or rebound. Extremities: B/L Lower Ext shows no edema,  both legs are warm to touch Neurology: Awake alert, and oriented X 3, CN II-XII intact, Non focal Skin: No Rash  Discharge Instructions  Discharge Instructions    Discharge patient   Complete by: As directed    Discharge disposition: 01-Home or Self Care   Discharge patient date: 09/13/2020     Allergies as of 09/13/2020      Reactions   Aspirin Other (See Comments)   Increased heart rate   Tramadol Hives, Itching      Medication List    STOP taking these medications   morphine 15 MG tablet Commonly known as: MSIR     TAKE these medications   Aleve 220 MG tablet Generic drug: naproxen sodium Take 440 mg by mouth.   cetirizine 10 MG tablet Commonly known as: ZYRTEC Take 1 tablet (10 mg total) by mouth daily.   ergocalciferol 1.25 MG (50000 UT) capsule Commonly known as: VITAMIN D2 Take 50,000 Units by mouth every 30 (thirty) days.   fluticasone 50 MCG/ACT nasal spray Commonly known  as: FLONASE Place 2 sprays into both nostrils daily.   folic acid 1 MG tablet Commonly known as: FOLVITE Take 1 tablet (1 mg total) by mouth daily.   HYDROcodone-acetaminophen 5-325 MG tablet Commonly known as: NORCO/VICODIN Take 1 tablet by mouth every 4 (four) hours as needed.   ibuprofen 800 MG tablet Commonly known as: ADVIL Take 1 tablet (800 mg total) by mouth 3 (three) times daily.   oxyCODONE-acetaminophen 10-325 MG tablet Commonly known as: PERCOCET Take 1 tablet by mouth See admin instructions. Take 1 tablet by mouth every 4-6 hours as needed for pain What changed: Another medication with the same name was removed. Continue taking this medication, and follow the directions you see here.   tetrahydrozoline-zinc 0.05-0.25 % ophthalmic solution Commonly known as: VISINE-AC Place 2 drops into both eyes 3 (three) times daily as needed (dry eyes).       The results of significant diagnostics from this hospitalization (including imaging, microbiology, ancillary and  laboratory) are listed below for reference.    Significant Diagnostic Studies: DG Chest 2 View  Result Date: 09/11/2020 CLINICAL DATA:  Shortness of breath EXAM: CHEST - 2 VIEW COMPARISON:  08/13/2012 FINDINGS: There are small bilateral pleural effusions. There is mild cardiomegaly with vascular congestion and diffuse interstitial opacity, suspect for pulmonary edema. Aortic atherosclerosis. Basilar airspace disease, probable atelectasis. No pneumothorax. IMPRESSION: Cardiomegaly with vascular congestion, small bilateral effusions and diffuse interstitial process favored to represent pulmonary edema. More confluent airspace disease at the bases, probable atelectasis. Electronically Signed   By: Jasmine PangKim  Fujinaga M.D.   On: 09/11/2020 16:06    Microbiology: Recent Results (from the past 240 hour(s))  Respiratory Panel by RT PCR (Flu A&B, Covid) - Nasopharyngeal Swab     Status: None   Collection Time: 09/04/20  8:03 PM   Specimen: Nasopharyngeal Swab  Result Value Ref Range Status   SARS Coronavirus 2 by RT PCR NEGATIVE NEGATIVE Final    Comment: (NOTE) SARS-CoV-2 target nucleic acids are NOT DETECTED.  The SARS-CoV-2 RNA is generally detectable in upper respiratoy specimens during the acute phase of infection. The lowest concentration of SARS-CoV-2 viral copies this assay can detect is 131 copies/mL. A negative result does not preclude SARS-Cov-2 infection and should not be used as the sole basis for treatment or other patient management decisions. A negative result may occur with  improper specimen collection/handling, submission of specimen other than nasopharyngeal swab, presence of viral mutation(s) within the areas targeted by this assay, and inadequate number of viral copies (<131 copies/mL). A negative result must be combined with clinical observations, patient history, and epidemiological information. The expected result is Negative.  Fact Sheet for Patients:   https://www.moore.com/https://www.fda.gov/media/142436/download  Fact Sheet for Healthcare Providers:  https://www.young.biz/https://www.fda.gov/media/142435/download  This test is no t yet approved or cleared by the [place] FDA and  has been authorized for detection and/or diagnosis of SARS-CoV-2 by FDA under an Emergency Use Authorization (EUA). This EUA will remain  in effect (meaning this test can be used) for the duration of the COVID-19 declaration under Section 564(b)(1) of the Act, 21 U.S.C. section 360bbb-3(b)(1), unless the authorization is terminated or revoked sooner.     Influenza A by PCR NEGATIVE NEGATIVE Final   Influenza B by PCR NEGATIVE NEGATIVE Final    Comment: (NOTE) The Xpert Xpress SARS-CoV-2/FLU/RSV assay is intended as an aid in  the diagnosis of influenza from Nasopharyngeal swab specimens and  should not be used as a sole basis for treatment. Nasal  washings and  aspirates are unacceptable for Xpert Xpress SARS-CoV-2/FLU/RSV  testing.  Fact Sheet for Patients: https://www.moore.com/  Fact Sheet for Healthcare Providers: https://www.young.biz/  This test is not yet approved or cleared by the [place] FDA and  has been authorized for detection and/or diagnosis of SARS-CoV-2 by  FDA under an Emergency Use Authorization (EUA). This EUA will remain  in effect (meaning this test can be used) for the duration of the  Covid-19 declaration under Section 564(b)(1) of the Act, 21  U.S.C. section 360bbb-3(b)(1), unless the authorization is  terminated or revoked. Performed at [facility], [address] [place], [place] [address]      Labs: Basic Metabolic Panel: Recent Labs  Lab 09/08/20 1058  NA 139  K 4.4  CL 108  CO2 24  GLUCOSE 91  BUN 14  CREATININE 1.20  CALCIUM 9.0   Liver Function Tests: No results for input(s): AST, ALT, ALKPHOS, BILITOT, PROT, ALBUMIN in the last 168 hours. No results for input(s): LIPASE,  AMYLASE in the last 168 hours. No results for input(s): AMMONIA in the last 168 hours. CBC: Recent Labs  Lab 09/06/20 1714 09/07/20 0752 09/08/20 1058 09/09/20 1052  WBC 7.8 10.7* 8.6 7.8  HGB 8.0* 8.8* 7.9* 7.7*  HCT 22.2* 24.9* 22.1* 21.6*  MCV 87.7 88.0 87.4 86.4  PLT 141* 143* 141* 141*   Cardiac Enzymes: No results for input(s): CKTOTAL, CKMB, CKMBINDEX, TROPONINI in the last 168 hours. BNP: Invalid input(s): POCBNP CBG: No results for input(s): GLUCAP in the last 168 hours.  Time coordinating discharge: 35 minutes  Signed:  [name]  APRN, MSN, FNP-C Patient Care [facility] [address] [place], [place] [address] [phone]  [facility] 09/13/2020, 12:39 PM

## 2020-09-13 NOTE — Progress Notes (Signed)
Patient was given all discharge instructions. Erin Sons was contacted on behalf of [name] due his medications being lost. The patient's medications were sent to [name] out patient pharmacy. Patient left the hospital once contacted by the pharmacy the patient was taken out of the hospital with his belongings.

## 2020-09-14 ENCOUNTER — Encounter (HOSPITAL_COMMUNITY): Payer: Self-pay

## 2020-09-14 ENCOUNTER — Encounter (HOSPITAL_COMMUNITY): Payer: Self-pay | Admitting: Emergency Medicine

## 2020-09-14 ENCOUNTER — Ambulatory Visit (INDEPENDENT_AMBULATORY_CARE_PROVIDER_SITE_OTHER)
Admission: EM | Admit: 2020-09-14 | Discharge: 2020-09-14 | Disposition: A | Discharge status: Home / Self Care | Payer: Medicare Other | Source: Home / Self Care

## 2020-09-14 ENCOUNTER — Emergency Department (HOSPITAL_COMMUNITY)
Admission: EM | Admit: 2020-09-14 | Discharge: 2020-09-15 | Disposition: A | Discharge status: Other Acute Inpatient Hospital | Payer: Medicare Other | Attending: Emergency Medicine | Admitting: Emergency Medicine

## 2020-09-14 ENCOUNTER — Other Ambulatory Visit: Payer: Self-pay

## 2020-09-14 DIAGNOSIS — Z20822 Contact with and (suspected) exposure to covid-19: Secondary | ICD-10-CM | POA: Diagnosis not present

## 2020-09-14 DIAGNOSIS — F4321 Adjustment disorder with depressed mood: Secondary | ICD-10-CM | POA: Insufficient documentation

## 2020-09-14 DIAGNOSIS — Z5900 Homelessness unspecified: Secondary | ICD-10-CM

## 2020-09-14 DIAGNOSIS — R52 Pain, unspecified: Secondary | ICD-10-CM | POA: Diagnosis not present

## 2020-09-14 DIAGNOSIS — R45851 Suicidal ideations: Secondary | ICD-10-CM | POA: Insufficient documentation

## 2020-09-14 DIAGNOSIS — M545 Low back pain, unspecified: Secondary | ICD-10-CM | POA: Diagnosis not present

## 2020-09-14 DIAGNOSIS — I10 Essential (primary) hypertension: Secondary | ICD-10-CM | POA: Diagnosis not present

## 2020-09-14 DIAGNOSIS — F17290 Nicotine dependence, other tobacco product, uncomplicated: Secondary | ICD-10-CM | POA: Insufficient documentation

## 2020-09-14 DIAGNOSIS — D571 Sickle-cell disease without crisis: Secondary | ICD-10-CM | POA: Insufficient documentation

## 2020-09-14 DIAGNOSIS — F329 Major depressive disorder, single episode, unspecified: Secondary | ICD-10-CM | POA: Insufficient documentation

## 2020-09-14 LAB — COMPREHENSIVE METABOLIC PANEL
ALT: 24 U/L (ref 0–44)
AST: 41 U/L (ref 15–41)
Albumin: 3.8 g/dL (ref 3.5–5.0)
Alkaline Phosphatase: 301 U/L — ABNORMAL HIGH (ref 38–126)
Anion gap: 10 (ref 5–15)
BUN: 23 mg/dL — ABNORMAL HIGH (ref 6–20)
CO2: 22 mmol/L (ref 22–32)
Calcium: 9.4 mg/dL (ref 8.9–10.3)
Chloride: 105 mmol/L (ref 98–111)
Creatinine, Ser: 2.14 mg/dL — ABNORMAL HIGH (ref 0.61–1.24)
GFR, Estimated: 36 mL/min — ABNORMAL LOW (ref >60)
Glucose, Bld: 100 mg/dL — ABNORMAL HIGH (ref 70–99)
Potassium: 4.1 mmol/L (ref 3.5–5.1)
Sodium: 137 mmol/L (ref 135–145)
Total Bilirubin: 3.1 mg/dL — ABNORMAL HIGH (ref 0.3–1.2)
Total Protein: 7.3 g/dL (ref 6.5–8.1)

## 2020-09-14 LAB — CBC WITH DIFFERENTIAL/PLATELET
Abs Immature Granulocytes: 0 10*3/uL (ref 0.00–0.07)
Basophils Absolute: 0.1 10*3/uL (ref 0.0–0.1)
Basophils Relative: 1 %
Eosinophils Absolute: 0.2 10*3/uL (ref 0.0–0.5)
Eosinophils Relative: 2 %
HCT: 26.2 % — ABNORMAL LOW (ref 39.0–52.0)
Hemoglobin: 8.7 g/dL — ABNORMAL LOW (ref 13.0–17.0)
Lymphocytes Relative: 26 %
Lymphs Abs: 2.6 10*3/uL (ref 0.7–4.0)
MCH: 30.2 pg (ref 26.0–34.0)
MCHC: 33.2 g/dL (ref 30.0–36.0)
MCV: 91 fL (ref 80.0–100.0)
Monocytes Absolute: 0.9 10*3/uL (ref 0.1–1.0)
Monocytes Relative: 9 %
Neutro Abs: 6.3 10*3/uL (ref 1.7–7.7)
Neutrophils Relative %: 62 %
Platelets: 217 10*3/uL (ref 150–400)
RBC: 2.88 MIL/uL — ABNORMAL LOW (ref 4.22–5.81)
RDW: 24.3 % — ABNORMAL HIGH (ref 11.5–15.5)
WBC: 10.1 10*3/uL (ref 4.0–10.5)
nRBC: 18.3 % — ABNORMAL HIGH (ref 0.0–0.2)
nRBC: 35 /100 WBC — ABNORMAL HIGH

## 2020-09-14 LAB — ACETAMINOPHEN LEVEL: Acetaminophen (Tylenol), Serum: <10 ug/mL — ABNORMAL LOW (ref 10–30)

## 2020-09-14 LAB — RETICULOCYTES
Immature Retic Fract: 33.6 % — ABNORMAL HIGH (ref 2.3–15.9)
RBC.: 2.84 MIL/uL — ABNORMAL LOW (ref 4.22–5.81)
Retic Count, Absolute: 394 10*3/uL — ABNORMAL HIGH (ref 19.0–186.0)
Retic Ct Pct: 13.4 % — ABNORMAL HIGH (ref 0.4–3.1)

## 2020-09-14 LAB — RAPID URINE DRUG SCREEN, HOSP PERFORMED
Amphetamines: NOT DETECTED
Barbiturates: NOT DETECTED
Benzodiazepines: NOT DETECTED
Cocaine: POSITIVE — AB
Opiates: NOT DETECTED
Tetrahydrocannabinol: NOT DETECTED

## 2020-09-14 LAB — ETHANOL: Alcohol, Ethyl (B): <10 mg/dL (ref <10)

## 2020-09-14 LAB — SALICYLATE LEVEL: Salicylate Lvl: <7 mg/dL — ABNORMAL LOW (ref 7.0–30.0)

## 2020-09-14 NOTE — ED Provider Notes (Signed)
Behavioral Health Urgent Care Medical Screening Exam  Patient Name: Scott Norton MRN: [id number] Date of Evaluation: 09/14/20 Chief Complaint: Chief Complaint/Presenting Problem: NA Diagnosis:  Final diagnoses:  None    History of Present illness: Scott Norton is a 54 y.o. male patient presents to [facility] as walk in with complaints of homelessness and worsening depression.  Scott Norton, 54 y.o., male patient seen face to face by this provider, consulted with Dr. Lucianne Muss; and chart reviewed on 09/14/20.  On evaluation Scott Norton reports he has been getting the run around from the social worker at hospital, [employer], and [facility] and has no where to stay.  "My spirits were up till this morning when kept getting the run around.  States he has no psychiatric history and when was given the Zoloft while in hospital but doesn't feel he needs it and is not taking.  Patient stating that he needs a place to stay because it is cold outside.      During evaluation Scott Norton is alert/oriented x 4; calm/cooperative; and mood is congruent with affect.  He does not appear to be responding to internal/external stimuli or delusional thoughts.  Patient denies suicidal/self-harm/homicidal ideation, psychosis, and paranoia.  Patient answered question appropriately.     Social worker checking to see if there are any shelter with beds for tonight.    Psychiatric Specialty Exam  Presentation  General Appearance:Appropriate for Environment;Casual (Soiled clothing)  Eye Contact:Good  Speech:Clear and Coherent;Normal Rate  Speech Volume:Normal  Handedness:Right   Mood and Affect  Mood:Depressed  Affect:Appropriate;Congruent   Thought Process  Thought Processes:Coherent;Goal Directed;Irrevelant (Patient wanting to give life history of incidents that has caused depression)  Descriptions of Associations:Intact  Orientation:Full (Time, Place and Person)  Thought Content:No data  recorded Hallucinations:No data recorded Ideas of Reference:No data recorded Suicidal Thoughts:No data recorded Homicidal Thoughts:No data recorded  Sensorium  Memory:No data recorded Judgment:No data recorded Insight:No data recorded  Executive Functions  Concentration:No data recorded Attention Span:Good  Recall:Good  Fund of Knowledge:Good  Language:Good   Psychomotor Activity  Psychomotor Activity:Normal   Assets  Assets:Communication Skills;Desire for Improvement   Sleep  Sleep:Good  Number of hours: No data recorded  Physical Exam: Physical Exam Vitals and nursing note reviewed.  Constitutional:      General: He is not in acute distress.    Appearance: Normal appearance. He is not ill-appearing or toxic-appearing.  HENT:     Head: Normocephalic and atraumatic.  Cardiovascular:     Rate and Rhythm: Normal rate and regular rhythm.  Musculoskeletal:        General: Normal range of motion.     Cervical back: Normal range of motion and neck supple.  Skin:    General: Skin is warm and dry.  Neurological:     General: No focal deficit present.     Mental Status: He is alert and oriented to person, place, and time.  Psychiatric:        Attention and Perception: Attention and perception normal. He does not perceive auditory or visual hallucinations.        Mood and Affect: Mood and affect normal.        Speech: Speech normal.        Behavior: Behavior normal. Behavior is cooperative.        Thought Content: Thought content normal. Thought content is not paranoid or delusional. Thought content does not include homicidal ideation.        Cognition and Memory: Cognition and  memory normal.        Judgment: Judgment normal.    Review of Systems  Psychiatric/Behavioral: Negative for hallucinations. Depression: Stable. The patient is not nervous/anxious.        Yesterday got out of the hospital after 9 days.  Described incident of "we're not making any more  money on you so got get out.  Told them I'm homeless and got know where to go; They just told me to go to [facility]."  Stating that hotel filled to compactly and no more rooms   Blood pressure (!) 153/72, pulse 80, temperature 98 F (36.7 C), temperature source Temporal, resp. rate 18, SpO2 98 %. There is no height or weight on file to calculate BMI.  Musculoskeletal: Strength & Muscle Tone: within normal limits Gait & Station: normal Patient leans: N/A  [facility] MSE Discharge Disposition for Follow up and Recommendations: Based on my evaluation the patient does not appear to have an emergency medical condition and can be discharged with resources and follow up care in outpatient services for Medication Management, Individual Therapy and Group Therapy   Shadaya Marschner, NP 09/14/2020, 6:54 PM

## 2020-09-14 NOTE — BH Assessment (Signed)
Comprehensive Clinical Assessment (CCA) Note  09/14/2020 Scott Norton [id number]   Patient is a [age] year old male presenting [facility] to [facility] assessment with a chief complaint of homelessness, sickle cell pain, and depression. Patient reports earlier this date he was denied [facility] both [facility] and [facility] triggering a "mental breakdown." Patient reports passive SI secondary to pain and homelessness. He denies current SI/HI/AVH. Patient denies any prior mental health treatment. Patient reports drinking 1 pint of liquor daily but states his alcohol use is not an issue. Patient also reports he has current charges drug charges pending, contributing to his distress. Patient states he does not have any natural supports to contact for collateral information.  Per Assunta FoundShuvon Rankin, NP this patient does not meet in patient care criteria. Patient provided with housing resources.  Chief Complaint:  Chief Complaint  Patient presents with  . Depression  . Homeless   Visit Diagnosis:  F43.21 Adjustment disorder with depressed mood   CCA Screening, Triage and Referral (STR)  Patient Reported Information How did you hear about us? Family/Friend  Referral name: No data recorded Referral phone number: No data recorded  Whom do you see for routine medical problems? Hospital ER  [facility]  Practice/Facility Phone Number: No data recorded Name of Contact: No data recorded Contact Number: No data recorded Contact Fax Number: No data recorded Prescriber Name: No data recorded Prescriber Address (if known): No data recorded  What Is the Reason for Your Visit/Call Today? homeless, sickle cell pain, depression  How Long Has This Been Causing You Problems? > than 6 months  What Do You Feel Would Help You the Most Today? Therapy;Medication   Have You Recently Been in Any Inpatient Treatment (Hospital/Detox/Crisis Center/28-Day Program)? Yes  Name/Location of  Program/Hospital[facility]  How Long Were You There? 9 days  When Were You Discharged? No data recorded  Have You Ever Received Services From [facility] Before? No  Who Do You See at [facility]? No data recorded  Have You Recently Had Any Thoughts About Hurting Yourself? Yes  Are You Planning to Commit Suicide/Harm Yourself At This time? No   Have you Recently Had Thoughts About Hurting Someone Karolee Ohslse? Yes  Explanation: No data recorded  Have You Used Any Alcohol or Drugs in the Past 24 Hours? Yes  How Long Ago Did You Use Drugs or Alcohol? No data recorded What Did You Use and How Much? 1 pint alcohol   Do You Currently Have a Therapist/Psychiatrist? No  Name of Therapist/Psychiatrist: No data recorded  Have You Been Recently Discharged From Any Office Practice or Programs? No  Explanation of Discharge From Practice/Program: No data recorded    CCA Screening Triage Referral Assessment Type of Contact: Face-to-Face  Is this Initial or Reassessment? No data recorded Date Telepsych consult ordered in [facility]:  No data recorded Time Telepsych consult ordered in [facility]:  No data recorded  Patient Reported Information Reviewed? Yes  Patient Left Without Being Seen? No data recorded Reason for Not Completing Assessment: No data recorded  Collateral Involvement: none   Does Patient Have a Court Appointed Legal Guardian? No data recorded Name and Contact of Legal Guardian: No data recorded If Minor and Not Living with Parent(s), Who has Custody? No data recorded Is CPS involved or ever been involved? Never  Is APS involved or ever been involved? Never   Patient Determined To Be At Risk for Harm To Self or Others Based on Review of Patient  Reported Information or Presenting Complaint? No  Method: No data recorded Availability of Means: No data recorded Intent: No data recorded Notification Required: No data recorded Additional Information for Danger to Others  Potential: No data recorded Additional Comments for Danger to Others Potential: No data recorded Are There Guns or Other Weapons in Your Home? No data recorded Types of Guns/Weapons: No data recorded Are These Weapons Safely Secured?                            No data recorded Who Could Verify You Are Able To Have These Secured: No data recorded Do You Have any Outstanding Charges, Pending Court Dates, Parole/Probation? No data recorded Contacted To Inform of Risk of Harm To Self or Others: No data recorded  Location of Assessment: [facility]   Does Patient Present under Involuntary Commitment? No  IVC Papers Initial File Date: No data recorded  [place] of Residence: [place]   Patient Currently Receiving the Following Services: Not Receiving Services   Determination of Need: Routine (7 days)   Options For Referral: Outpatient Therapy;Medication Management     CCA Biopsychosocial Intake/Chief Complaint:  NA  Current Symptoms/Problems: NA   Patient Reported Schizophrenia/Schizoaffective Diagnosis in Past: No   Strengths: NA  Preferences: NA  Abilities: NA   Type of Services Patient Feels are Needed: NA   Initial Clinical Notes/Concerns: NA   Mental Health Symptoms Depression:  Change in energy/activity;Hopelessness;Irritability;Sleep (too much or little)   Duration of Depressive symptoms: Greater than two weeks   Mania:  None   Anxiety:   None   Psychosis:  None   Duration of Psychotic symptoms: No data recorded  Trauma:  None   Obsessions:  None   Compulsions:  None   Inattention:  None   Hyperactivity/Impulsivity:  N/A   Oppositional/Defiant Behaviors:  N/A   Emotional Irregularity:  N/A   Other Mood/Personality Symptoms:  No data recorded   Mental Status Exam Appearance and self-care  Stature:  Average   Weight:  Average weight   Clothing:  Dirty   Grooming:  Neglected   Cosmetic use:  None   Posture/gait:   Normal   Motor activity:  Not Remarkable   Sensorium  Attention:  Normal   Concentration:  Normal   Orientation:  X5   Recall/memory:  Normal   Affect and Mood  Affect:  Appropriate   Mood:  Depressed   Relating  Eye contact:  Normal   Facial expression:  Responsive   Attitude toward examiner:  Cooperative   Thought and Language  Speech flow: Clear and Coherent   Thought content:  Appropriate to Mood and Circumstances   Preoccupation:  None   Hallucinations:  None   Organization:  No data recorded  [employer] of Knowledge:  Fair   Intelligence:  Average   Abstraction:  Normal   Judgement:  Fair   Dance movement psychotherapist:  Realistic   Insight:  Fair   Decision Making:  Normal   Social Functioning  Social Maturity:  Responsible   Social Judgement:  Normal   Stress  Stressors:  Housing;Legal   Coping Ability:  Contractor Deficits:  Activities of daily living   Supports:  Support needed     Religion: Religion/Spirituality Are You A Religious Person?: No  Leisure/Recreation: Leisure / Recreation Do You Have Hobbies?: No  Exercise/Diet: Exercise/Diet Do You Exercise?: No Have You Gained or  Lost A Significant Amount of Weight in the Past Six Months?: No Do You Follow a Special Diet?: No Do You Have Any Trouble Sleeping?: Yes Explanation of Sleeping Difficulties: due to homelessness   CCA Employment/Education Employment/Work Situation: Employment / Work Situation Employment situation: On disability Why is patient on disability: sickle cell How long has patient been on disability: UTA Patient's job has been impacted by current illness: No What is the longest time patient has a held a job?: NA Where was the patient employed at that time?: NA Has patient ever been in the [employer]?: No  Education: Education Is Patient Currently Attending School?: No Last Grade Completed: 12 Name of High School: [employer] Did Garment/textile technologist  From [employer]?: Yes Did Theme park manager?: No Did You Attend Graduate School?: No Did You Have An Individualized Education Program (IIEP): No Did You Have Any Difficulty At School?: No Patient's Education Has Been Impacted by Current Illness: No   CCA Family/Childhood History Family and Relationship History: Family history Marital status: Single Are you sexually active?: Yes What is your sexual orientation?: NA Has your sexual activity been affected by drugs, alcohol, medication, or emotional stress?: NA Does patient have children?: Yes How is patient's relationship with their children?: multiple- does not have relationship  Childhood History:  Childhood History By whom was/is the patient raised?: Other (Comment) (aunt) Additional childhood history information: father died when he was a baby and mother could not care for them Description of patient's relationship with caregiver when they were a child: close Patient's description of current relationship with people who raised him/her: decesased How were you disciplined when you got in trouble as a child/adolescent?: NA Does patient have siblings?: No Did patient suffer any verbal/emotional/physical/sexual abuse as a child?: No Did patient suffer from severe childhood neglect?: No Has patient ever been sexually abused/assaulted/raped as an adolescent or adult?: No Was the patient ever a victim of a crime or a disaster?: No Witnessed domestic violence?: No Has patient been affected by domestic violence as an adult?: No  Child/Adolescent Assessment:     CCA Substance Use Alcohol/Drug Use: Alcohol / Drug Use Pain Medications: see MAR Prescriptions: see MAR Over the Counter: see MAR History of alcohol / drug use?: Yes Substance #1 Name of Substance 1: Alcohol 1 - Age of First Use: UTA 1 - Amount (size/oz): 1 pint 1 - Frequency: daily 1 - Duration: UTA 1 - Last Use / Amount: earlier this date                        ASAM's:  Six Dimensions of Multidimensional Assessment  Dimension 1:  Acute Intoxication and/or Withdrawal Potential:      Dimension 2:  Biomedical Conditions and Complications:      Dimension 3:  Emotional, Behavioral, or Cognitive Conditions and Complications:     Dimension 4:  Readiness to Change:     Dimension 5:  Relapse, Continued use, or Continued Problem Potential:     Dimension 6:  Recovery/Living Environment:     ASAM Severity Score:    ASAM Recommended Level of Treatment:     Substance use Disorder (SUD)    Recommendations for Services/Supports/Treatments:    DSM5 Diagnoses: Patient Active Problem List   Diagnosis Date Noted  . Homelessness 09/14/2020  . Verbalizes suicidal thoughts 09/04/2020  . Tobacco use 09/04/2020  . Sickle cell pain crisis (HCC) 04/11/2015  . Respiratory infection 08/13/2012  . Healthcare maintenance 06/04/2012  .  Cervical strain, acute 12/09/2011  . Sickle cell anemia (HCC) 11/29/2011  . Seasonal allergies 11/29/2011    Patient Centered Plan: Patient is on the following Treatment Plan(s):    Referrals to Alternative Service(s): Referred to Alternative Service(s):   Place:   Date:   Time:    Referred to Alternative Service(s):   Place:   Date:   Time:    Referred to Alternative Service(s):   Place:   Date:   Time:    Referred to Alternative Service(s):   Place:   Date:   Time:     Celedonio Miyamoto, LCSW

## 2020-09-14 NOTE — ED Triage Notes (Signed)
Pt presents to the [facility] for worsening depression and suicidal ideation with no plan. Pt denies AVH and HI. Pt reports he has sickle cell disease and was in the hospital yesterday for a sickle cell crisis. Pt reports that he was discharged from the hospital with nowhere to go. Pt reports that the [facility] could not help because they were at capacity.

## 2020-09-14 NOTE — ED Triage Notes (Signed)
Pt states that he was discharged yesterday from [facility] yesterday and has been out in the cold all day, no feeling suicidal since he is homeless. Plans on jumping off a bridge in front of a 18 wheeler. Pt states he is also having sickle cell pain

## 2020-09-15 ENCOUNTER — Ambulatory Visit (INDEPENDENT_AMBULATORY_CARE_PROVIDER_SITE_OTHER)
Admission: EM | Admit: 2020-09-15 | Discharge: 2020-09-15 | Disposition: A | Discharge status: Home / Self Care | Payer: Medicare Other | Source: Home / Self Care

## 2020-09-15 DIAGNOSIS — D571 Sickle-cell disease without crisis: Secondary | ICD-10-CM

## 2020-09-15 DIAGNOSIS — F4321 Adjustment disorder with depressed mood: Secondary | ICD-10-CM | POA: Insufficient documentation

## 2020-09-15 DIAGNOSIS — Z5900 Homelessness unspecified: Secondary | ICD-10-CM | POA: Insufficient documentation

## 2020-09-15 LAB — RESP PANEL BY RT-PCR (FLU A&B, COVID) ARPGX2
Influenza A by PCR: NEGATIVE
Influenza B by PCR: NEGATIVE
SARS Coronavirus 2 by RT PCR: NEGATIVE

## 2020-09-15 MED ORDER — IBUPROFEN 800 MG PO TABS
800.0000 mg | ORAL_TABLET | Freq: Four times a day (QID) | ORAL | Status: DC | PRN
  Administered 2020-09-15: 800 mg via ORAL
  Filled 2020-09-15: qty 1

## 2020-09-15 MED ORDER — TRAZODONE HCL 50 MG PO TABS
50.0000 mg | ORAL_TABLET | Freq: Every evening | ORAL | Status: DC | PRN
  Administered 2020-09-15: 50 mg via ORAL
  Filled 2020-09-15: qty 1

## 2020-09-15 MED ORDER — SODIUM CHLORIDE 0.9 % IV BOLUS
1000.0000 mL | Freq: Once | INTRAVENOUS | Status: AC
  Administered 2020-09-15: 1000 mL via INTRAVENOUS

## 2020-09-15 MED ORDER — ALUM & MAG HYDROXIDE-SIMETH 200-200-20 MG/5ML PO SUSP: 30.0000 mL | ORAL | Status: DC | PRN

## 2020-09-15 MED ORDER — MAGNESIUM HYDROXIDE 400 MG/5ML PO SUSP: 30.0000 mL | Freq: Every day | ORAL | Status: DC | PRN

## 2020-09-15 MED ORDER — ACETAMINOPHEN 325 MG PO TABS
650.0000 mg | ORAL_TABLET | Freq: Four times a day (QID) | ORAL | Status: DC | PRN
  Administered 2020-09-15: 650 mg via ORAL
  Filled 2020-09-15: qty 2

## 2020-09-15 MED ORDER — HYDROXYZINE HCL 25 MG PO TABS
25.0000 mg | ORAL_TABLET | Freq: Three times a day (TID) | ORAL | Status: DC | PRN
  Administered 2020-09-15: 25 mg via ORAL
  Filled 2020-09-15: qty 1

## 2020-09-15 NOTE — ED Notes (Signed)
Pt TTS 

## 2020-09-15 NOTE — BH Assessment (Signed)
Comprehensive Clinical Assessment (CCA) Screening, Triage and Referral Note  09/15/2020 Scott Norton [id number]    Per EDP note, "Scott Norton is a 54 y.o. male with past medical history of sickle cell anemia, homelessness, depression presents to the emergency department with suicidal thoughts with plan to jump off a bridge in front of traffic.  Patient states that the root of his issues are homelessness.  He has been living in a hotel but was effectively kicked out of that location when he complained about his neighbor screaming all night.  He called his Child psychotherapist and was initially referred to the [facility] and when they cannot help and he was referred to [facility].  He states they could not help him and he began feeling suicidal.  He reports going to behavioral health but ultimately discharged.  He states he was feeling increasingly hopeless and developed a plan to jump off of an overpass in front of an 18 wheeler in order to "make it quick." "   Pt was seen earlier yesterday at [facility] on 09/14/20 and presents for similar presentation of sickle cell anemia and homelessness, pt states he was having suicidal thoughts earlier with plan to jump off bridge , states he does not feel this way now, states he down because he did not receive any help after going to several places such as [facility] and [facility] who did not provider him with temporary housing. He denies current SI/HI/AVH. Patient denies any prior mental health treatment. Patient reports drinking 1 pint of liquor daily but states his alcohol use is not an issue. Patient also reports he has current charges drug charges pending, contributing to his distress. Patient states he does not have any natural supports and needs help mainly with housing.    Diagnosis: F43.21 Adjustment disorder with depressed mood  Disposition: Nira Conn, FNP recommends pt for overnight observation at [facility] Chief Complaint:  Chief Complaint  Patient presents  with  . Suicidal   Visit Diagnosis: medical clearance  Patient Reported Information How did you hear about [place]? Self   Referral name: self  Referral phone number: No data recorded Whom do you see for routine medical problems? I don't have a doctor   [facility]   Practice/Facility Phone Number: No data recorded  Name of Contact: No data recorded  Contact Number: No data recorded  Contact Fax Number: No data recorded  Prescriber Name: No data recorded  Prescriber Address (if known): No data recorded What Is the Reason for Your Visit/Call Today? homeless, sickle cell pain, depression  How Long Has This Been Causing You Problems? > than 6 months  Have You Recently Been in Any Inpatient Treatment (Hospital/Detox/Crisis Center/28-Day Program)? Yes   Name/Location of Program/Hospital[facility]   How Long Were You There? 9 days   When Were You Discharged? No data recorded Have You Ever Received Services From [facility] Before? No   Who Do You See at [facility]? No data recorded Have You Recently Had Any Thoughts About Hurting Yourself? Yes   Are You Planning to Commit Suicide/Harm Yourself At This time?  No  Have you Recently Had Thoughts About Hurting Someone [name]? Yes   Explanation: No data recorded Have You Used Any Alcohol or Drugs in the Past 24 Hours? Yes   How Long Ago Did You Use Drugs or Alcohol?  No data recorded  What Did You Use and How Much? 1 pint alcohol  What Do You Feel Would Help You  the Most Today? Assessment Only  Do You Currently Have a Therapist/Psychiatrist? No   Name of Therapist/Psychiatrist: No data recorded  Have You Been Recently Discharged From Any Office Practice or Programs? No   Explanation of Discharge From Practice/Program:  No data recorded    CCA Screening Triage Referral Assessment Type of Contact: Tele-Assessment   Is this Initial or Reassessment? Initial  Date Telepsych consult ordered in [facility]:   09-15-20  Time Telepsych consult ordered in [facility]:  No data recorded Patient Reported Information Reviewed? Yes   Patient Left Without Being Seen? No data recorded  Reason for Not Completing Assessment: No data recorded Collateral Involvement: none  Does Patient Have a Court Appointed Legal Guardian? No data recorded  Name and Contact of Legal Guardian:  No data recorded If Minor and Not Living with Parent(s), Who has Custody? No data recorded Is CPS involved or ever been involved? Never  Is APS involved or ever been involved? Never  Patient Determined To Be At Risk for Harm To Self or Others Based on Review of Patient Reported Information or Presenting Complaint? No   Method: No data recorded  Availability of Means: No data recorded  Intent: No data recorded  Notification Required: No data recorded  Additional Information for Danger to Others Potential:  No data recorded  Additional Comments for Danger to Others Potential:  No data recorded  Are There Guns or Other Weapons in Your Home?  No data recorded   Types of Guns/Weapons: No data recorded   Are These Weapons Safely Secured?                              No data recorded   Who Could Verify You Are Able To Have These Secured:    No data recorded Do You Have any Outstanding Charges, Pending Court Dates, Parole/Probation? No data recorded Contacted To Inform of Risk of Harm To Self or Others: No data recorded Location of Assessment: [facility] ED  Does Patient Present under Involuntary Commitment? No   IVC Papers Initial File Date: No data recorded  [place] of Residence: [place]  Patient Currently Receiving the Following Services: Not Receiving Services   Determination of Need: Routine (7 days)   Options For Referral: Overnight Observation  Natasha Mead, LCSWA

## 2020-09-15 NOTE — ED Notes (Addendum)
Writer walked on unit and witnessed pt standing over another pt yelling and cursing. Saying pt had said for Scott Norton to "suck his dick" Staff was in between the two pt trying to contain the situation peacefully and quietly, but Scott Norton was still yelling and cursing at pt saying "Im not ging to suck your dick". He did not want pt or any other pt in bed #4.  He has been standing at the desk talking to staff, but in a aggressive manner with tone. Will continue to monitor pt for he aggressive behavior. I alerted security and NP to come to unit

## 2020-09-15 NOTE — ED Notes (Signed)
Pt belongings secured in locker via [employer] and staff. Pt redirected by NP of verbally aggressive behaviors toward male staff. Pt calmed down. [employer] on unit. Pt remain talkative with inappropriate flirtatious behaviors toward male staff. Pt redirected. Pt easily agitated. Will continue to monitor.

## 2020-09-15 NOTE — ED Notes (Signed)
Dinner given- salad, tuna sandwich, cola

## 2020-09-15 NOTE — ED Notes (Addendum)
Snack of cold cereal and milk provided to patient.

## 2020-09-15 NOTE — Discharge Instructions (Signed)
°  Discharge recommendations:  Patient is to take medications as prescribed. Please see information for follow-up appointment with psychiatry and therapy. Please follow up with your primary care provider for all medical related needs.   Therapy: We recommend that patient participate in individual therapy to address mental health concerns.  Safety:  The patient should abstain from use of illicit substances/drugs and abuse of any medications. If symptoms worsen or do not continue to improve or if the patient becomes actively suicidal or homicidal then it is recommended that the patient return to the closet hospital emergency department, the [facility], or call 911 for further evaluation and treatment. National Suicide Prevention Lifeline 1-800-SUICIDE or 819-092-5581.

## 2020-09-15 NOTE — ED Notes (Signed)
Returned to observation area from triage & saw patient standing at desk talking with staff. Attempted to educate patient on importance of lying down & relaxing so sleep medication would work. Patient became upset and accused this nurse of telling him he needed to 'sit down and shut up'. This RN replied 'that was not what I said, I said that for the medication that you requested to help you go to sleep to work effectively you needed to lie down'. Patient replied 'well talking to them is good for me' and continued to stand at desk and talk.

## 2020-09-15 NOTE — BH Assessment (Signed)
Attempted to find patient placement in the following shelters in order to coordinate a discharge plan.   *[facility] [place] [place] [place] [facility]  *[facility], [place] capacity until next Monday or Tuesday, per [place]. Patient is exclusionary from participating in the program due to sex offender status.  *[facility], [place], [place]-Confirmed bed availability. However, patient is exclusionary from participating in the program due to sex offender status.

## 2020-09-15 NOTE — BHH Counselor (Signed)
TTS spoke with nurse Shanda Bumps states she will call TTS once patient is ready to be seen, states she has not seen patient yet he is currently still in hallway needs to be moved to a room.

## 2020-09-15 NOTE — ED Notes (Signed)
Pt calm, interacting well with staff. No acute distress noted. Safety maintained. Pt denies SI/HI.

## 2020-09-15 NOTE — ED Provider Notes (Signed)
Patient has been making threatening comments to staff for not getting the TV to work appropriately. A conversation was had with the patient that threats will not be allowed towards to staff or other patients. Patient then states that he was only trying to help her understand that she needs to be mindful of other patients that are admitted. Feel the patient was changing his meaning on his words to prevent from consequences of threatening staff. Patient was informed that we are here to help him and threats will not be tolerated and he can be escorted off of the premises.

## 2020-09-15 NOTE — ED Notes (Signed)
Pt is on the phone with the hotel where his belongings are.  His is cursing the person on the phone.  He is currently telling a tech about his clothing situation and a NP.

## 2020-09-15 NOTE — ED Notes (Signed)
Safe transport called to transport PT to [facility]

## 2020-09-15 NOTE — ED Provider Notes (Signed)
Behavioral Health Admission [facility]&P [facility]BS)  Date: 09/15/20 Patient Name: Scott Norton MRN: [id number] Chief Complaint: No chief complaint on file.     Diagnoses:  Final diagnoses:  Adjustment disorder with depressed mood  Homelessness  Sickle cell disease without crisis (HCC)    HPI: Patient is a 54 year old male with a past medical history of sickle cell anemia, homelessness, and depression that presented to the emergency department with suicidal thoughts with a plan to jump off of a bridge in front of traffic.  Patient reported that the main issue is his homelessness.  Patient reported he was staying at a hotel and he had to be moved out however he was in the hospital for 9 days due to his sickle cell crisis.  Patient presented to the [facility] C 09/14/2020 and was trying to provide him assistance through [facility] and [employer].  Patient was turned away due to capacity at facilities.  Patient continues to endorse suicidal ideations stating that he cannot live out there due to his sickle cell and also reported that he has alcohol use however he states that this is not a major concern.  Patient was asked about other possibilities and he stated that he is interested in any where he can go where he is not living on the street.  Social work is been assisting with trying to attempt to find of the places however it all places her at capacity at this time.  Patient also reports having a second degree sexual offense charge in 1996 which is also preventing him from getting accepted to other facilities.  Due to patient's risk factors and continued suicidal ideations patient will be admitted to the continuous observation unit and will continue to seek housing for this patient.  PHQ 2-9:     ED from 09/14/2020 in [facility] EMERGENCY DEPARTMENT ED to Hosp-Admission (Discharged) from 09/04/2020 in [place] LONG 6 EAST ONCOLOGY  C-SSRS RISK CATEGORY Error: Q6 is Yes, you must answer 7 High Risk        Total Time spent with patient: 30 minutes  Musculoskeletal  Strength & Muscle Tone: within normal limits Gait & Station: normal Patient leans: N/A  Psychiatric Specialty Exam  Presentation General Appearance: Appropriate for Environment;Casual  Eye Contact:Good  Speech:Clear and Coherent;Normal Rate  Speech Volume:Normal  Handedness:Right   Mood and Affect  Mood:Euthymic  Affect:Appropriate;Congruent   Thought Process  Thought Processes:Coherent  Descriptions of Associations:Intact  Orientation:Full (Time, Place and Person)  Thought Content:WDL  Hallucinations:Hallucinations: None  Ideas of Reference:None  Suicidal Thoughts:Suicidal Thoughts: Yes, Passive (If he has no where to live)  Homicidal Thoughts:Homicidal Thoughts: No   Sensorium  Memory:Immediate Good;Recent Good;Remote Good  Judgment:Good  Insight:Good   Executive Functions  Concentration:Good  Attention Span:Good  Recall:Good  Fund of Knowledge:Good  Language:Good   Psychomotor Activity  Psychomotor Activity:Psychomotor Activity: Normal   Assets  Assets:Communication Skills;Desire for Improvement;Financial Resources/Insurance   Sleep  Sleep:Sleep: Good   Physical Exam Vitals and nursing note reviewed.  Constitutional:      Appearance: He is well-developed.  HENT:     Head: Normocephalic.  Eyes:     Pupils: Pupils are equal, round, and reactive to light.  Cardiovascular:     Rate and Rhythm: Normal rate.  Pulmonary:     Effort: Pulmonary effort is normal.  Musculoskeletal:        General: Normal range of motion.  Neurological:     Mental Status: He is alert and oriented to  person, place, and time.    Review of Systems  Constitutional: Negative.   HENT: Negative.   Eyes: Negative.   Respiratory: Negative.   Cardiovascular: Negative.   Gastrointestinal: Negative.   Genitourinary: Negative.   Musculoskeletal: Negative.   Skin: Negative.    Neurological: Negative.   Endo/Heme/Allergies: Negative.   Psychiatric/Behavioral: Positive for depression, substance abuse and suicidal ideas.    Blood pressure (!) 149/84, pulse 65, temperature 98 F (36.7 C), temperature source Oral, resp. rate 18, SpO2 97 %. There is no height or weight on file to calculate BMI.  Past Psychiatric History: MDD, ETOH abuse   Is the patient at risk to self? Yes  Has the patient been a risk to self in the past 6 months? Yes .    Has the patient been a risk to self within the distant past? Yes   Is the patient a risk to others? No   Has the patient been a risk to others in the past 6 months? No   Has the patient been a risk to others within the distant past? No   Past Medical History:  Past Medical History:  Diagnosis Date   Anemia    Depression    Heart murmur    Pneumonia    ?ACS requring intubation   Seasonal allergies    wool & grass   Sickle cell anemia (HCC)     Past Surgical History:  Procedure Laterality Date   ROOT CANAL      Family History:  Family History  Problem Relation Age of Onset   Alcohol abuse Mother    Sickle cell trait Mother    Hypertension Mother    Alcohol abuse Father    Sickle cell trait Father    Early death Father        due to alcoholism   Hypertension Father    Hypertension Sister    Hypertension Brother    Hypertension Maternal Grandmother    Hypertension Maternal Grandfather    Hypertension Paternal Grandmother    Hypertension Paternal Grandfather     Social History:  Social History   Socioeconomic History   Marital status: Single    Spouse name: Not on file   Number of children: Not on file   Years of education: Not on file   Highest education level: Not on file  Occupational History   Not on file  Tobacco Use   Smoking status: Current Every Day Smoker    Packs/day: 0.50    Types: Cigars   Smokeless tobacco: Never Used  Substance and Sexual Activity    Alcohol use: Yes    Comment: occasional but indicated etoh abuse on health hx questionaire   Drug use: Not Currently   Sexual activity: Yes  Other Topics Concern   Not on file  Social History Narrative   Lives with Friends in [place]. Working on housing for himself   On disability for his SS disease      No children, no stable relationship   Social Determinants of Corporate investment banker Strain:    Difficulty of Paying Living Expenses: Not on file  Food Insecurity:    Worried About Programme researcher, broadcasting/film/video in the Last Year: Not on file   [employer] of Food in the Last Year: Not on file  Transportation Needs:    Lack of Transportation (Medical): Not on file   Lack of Transportation (Non-Medical): Not on file  Physical Activity:    Days  of Exercise per Week: Not on file   Minutes of Exercise per Session: Not on file  Stress:    Feeling of Stress : Not on file  Social Connections:    Frequency of Communication with Friends and Family: Not on file   Frequency of Social Gatherings with Friends and Family: Not on file   Attends Religious Services: Not on file   Active Member of Clubs or Organizations: Not on file   Attends Banker Meetings: Not on file   Marital Status: Not on file  Intimate Partner Violence:    Fear of Current or Ex-Partner: Not on file   Emotionally Abused: Not on file   Physically Abused: Not on file   Sexually Abused: Not on file    SDOH:  SDOH Screenings   Alcohol Screen:    Last Alcohol Screening Score (AUDIT): Not on file  Depression (PHQ2-9):    PHQ-2 Score: Not on file  Financial Resource Strain:    Difficulty of Paying Living Expenses: Not on file  Food Insecurity:    Worried About Programme researcher, broadcasting/film/video in the Last Year: Not on file   Ran Out of Food in the Last Year: Not on file  Housing:    Last Housing Risk Score: Not on file  Physical Activity:    Days of Exercise per Week: Not on file   Minutes of  Exercise per Session: Not on file  Social Connections:    Frequency of Communication with Friends and Family: Not on file   Frequency of Social Gatherings with Friends and Family: Not on file   Attends Religious Services: Not on file   Active Member of Clubs or Organizations: Not on file   Attends Banker Meetings: Not on file   Marital Status: Not on file  Stress:    Feeling of Stress : Not on file  Tobacco Use: High Risk   Smoking Tobacco Use: Current Every Day Smoker   Smokeless Tobacco Use: Never Used  Transportation Needs:    Freight forwarder (Medical): Not on file   Lack of Transportation (Non-Medical): Not on file    Last Labs:  Admission on 09/14/2020, Discharged on 09/15/2020  Component Date Value Ref Range Status   Alcohol, Ethyl (B) 09/14/2020 <10  <10 mg/dL Final   Comment: (NOTE) Lowest detectable limit for serum alcohol is 10 mg/dL.  For medical purposes only. Performed at [facility], [address], [place], [place] [address]    Salicylate Lvl 09/14/2020 <7.0* 7.0 - 30.0 mg/dL Final   Performed at [facility], [address] [place], [place] [address]   Acetaminophen (Tylenol), Serum 09/14/2020 <10* 10 - 30 ug/mL Final   Comment: (NOTE) Therapeutic concentrations vary significantly. A range of 10-30 ug/mL  may be an effective concentration for many patients. However, some  are best treated at concentrations outside of this range. Acetaminophen concentrations >150 ug/mL at 4 hours after ingestion  and >50 ug/mL at 12 hours after ingestion are often associated with  toxic reactions.  Performed at [facility], [address], [place], [place] [address]    Opiates 09/14/2020 NONE DETECTED  NONE DETECTED Final   Cocaine 09/14/2020 POSITIVE* NONE DETECTED Final   Benzodiazepines 09/14/2020 NONE DETECTED  NONE DETECTED Final   Amphetamines 09/14/2020 NONE DETECTED  NONE DETECTED Final    Tetrahydrocannabinol 09/14/2020 NONE DETECTED  NONE DETECTED Final   Barbiturates 09/14/2020 NONE DETECTED  NONE DETECTED Final  Comment: (NOTE) DRUG SCREEN FOR MEDICAL PURPOSES ONLY.  IF CONFIRMATION IS NEEDED FOR ANY PURPOSE, NOTIFY LAB WITHIN 5 DAYS.  LOWEST DETECTABLE LIMITS FOR URINE DRUG SCREEN Drug Class                     Cutoff (ng/mL) Amphetamine and metabolites    1000 Barbiturate and metabolites    200 Benzodiazepine                 200 Tricyclics and metabolites     300 Opiates and metabolites        300 Cocaine and metabolites        300 THC                            50 Performed at [facility], [address] [place], [place] [address]    Sodium 09/14/2020 137  135 - 145 mmol/L Final   Potassium 09/14/2020 4.1  3.5 - 5.1 mmol/L Final   Chloride 09/14/2020 105  98 - 111 mmol/L Final   CO2 09/14/2020 22  22 - 32 mmol/L Final   Glucose, Bld 09/14/2020 100* 70 - 99 mg/dL Final   Glucose reference range applies only to samples taken after fasting for at least 8 hours.   BUN 09/14/2020 23* 6 - 20 mg/dL Final   Creatinine, Ser 09/14/2020 2.14* 0.61 - 1.24 mg/dL Final   Calcium 98/92/1194 9.4  8.9 - 10.3 mg/dL Final   Total Protein 17/40/8144 7.3  6.5 - 8.1 g/dL Final   Albumin 81/85/6314 3.8  3.5 - 5.0 g/dL Final   AST 97/11/6376 41  15 - 41 U/L Final   ALT 09/14/2020 24  0 - 44 U/L Final   Alkaline Phosphatase 09/14/2020 301* 38 - 126 U/L Final   Total Bilirubin 09/14/2020 3.1* 0.3 - 1.2 mg/dL Final   GFR, Estimated 09/14/2020 36* >60 mL/min Final   Comment: (NOTE) Calculated using the CKD-EPI Creatinine Equation (2021)    Anion gap 09/14/2020 10  5 - 15 Final   Performed at [facility], [address], [place], [place] [address]   WBC 09/14/2020 10.1  4.0 - 10.5 K/uL Final   RBC 09/14/2020 2.88* 4.22 - 5.81 MIL/uL Final   Hemoglobin 09/14/2020 8.7* 13.0 - 17.0 g/dL Final   HCT 27/74/1287 26.2* 39 - 52 % Final   MCV  09/14/2020 91.0  80.0 - 100.0 fL Final   MCH 09/14/2020 30.2  26.0 - 34.0 pg Final   MCHC 09/14/2020 33.2  30.0 - 36.0 g/dL Final   RDW 86/76/7209 24.3* 11.5 - 15.5 % Final   Platelets 09/14/2020 217  150 - 400 K/uL Final   nRBC 09/14/2020 18.3* 0.0 - 0.2 % Final   Neutrophils Relative % 09/14/2020 62  % Final   Neutro Abs 09/14/2020 6.3  1.7 - 7.7 K/uL Final   Lymphocytes Relative 09/14/2020 26  % Final   Lymphs Abs 09/14/2020 2.6  0.7 - 4.0 K/uL Final   Monocytes Relative 09/14/2020 9  % Final   Monocytes Absolute 09/14/2020 0.9  0.1 - 1.0 K/uL Final   Eosinophils Relative 09/14/2020 2  % Final   Eosinophils Absolute 09/14/2020 0.2  0.0 - 0.5 K/uL Final   Basophils Relative 09/14/2020 1  % Final   Basophils Absolute 09/14/2020 0.1  0.0 - 0.1 K/uL Final   WBC Morphology 09/14/2020 See Note   Final  Mild Left Shift. 1 to 5% Metas and Myelos, Occ Pro Noted.   nRBC 09/14/2020 35* 0 /100 WBC Final   Abs Immature Granulocytes 09/14/2020 0.00  0.00 - 0.07 K/uL Final   Pappenheimer Bodies 09/14/2020 PRESENT   Final   Polychromasia 09/14/2020 PRESENT   Final   Sickle Cells 09/14/2020 PRESENT   Final   Target Cells 09/14/2020 PRESENT   Final   Performed at [facility], [address] [place], [place] [address]   Retic Ct Pct 09/14/2020 13.4* 0.4 - 3.1 % Final   RBC. 09/14/2020 2.84* 4.22 - 5.81 MIL/uL Final   Retic Count, Absolute 09/14/2020 394.0* 19.0 - 186.0 K/uL Final   Immature Retic Fract 09/14/2020 33.6* 2.3 - 15.9 % Final   Performed at [facility], [address] [place], [place] [address]   SARS Coronavirus 2 by RT PCR 09/15/2020 NEGATIVE  NEGATIVE Final   Comment: (NOTE) SARS-CoV-2 target nucleic acids are NOT DETECTED.  The SARS-CoV-2 RNA is generally detectable in upper respiratory specimens during the acute phase of infection. The lowest concentration of SARS-CoV-2 viral copies this assay can detect is 138 copies/mL. A negative  result does not preclude SARS-Cov-2 infection and should not be used as the sole basis for treatment or other patient management decisions. A negative result may occur with  improper specimen collection/handling, submission of specimen other than nasopharyngeal swab, presence of viral mutation(s) within the areas targeted by this assay, and inadequate number of viral copies(<138 copies/mL). A negative result must be combined with clinical observations, patient history, and epidemiological information. The expected result is Negative.  Fact Sheet for Patients:  BloggerCourse.comhttps://www.fda.gov/media/152166/download  Fact Sheet for Healthcare Providers:  SeriousBroker.ithttps://www.fda.gov/media/152162/download  This test is no                          t yet approved or cleared by the [place] FDA and  has been authorized for detection and/or diagnosis of SARS-CoV-2 by FDA under an Emergency Use Authorization (EUA). This EUA will remain  in effect (meaning this test can be used) for the duration of the COVID-19 declaration under Section 564(b)(1) of the Act, 21 U.S.C.section 360bbb-3(b)(1), unless the authorization is terminated  or revoked sooner.       Influenza A by PCR 09/15/2020 NEGATIVE  NEGATIVE Final   Influenza B by PCR 09/15/2020 NEGATIVE  NEGATIVE Final   Comment: (NOTE) The Xpert Xpress SARS-CoV-2/FLU/RSV plus assay is intended as an aid in the diagnosis of influenza from Nasopharyngeal swab specimens and should not be used as a sole basis for treatment. Nasal washings and aspirates are unacceptable for Xpert Xpress SARS-CoV-2/FLU/RSV testing.  Fact Sheet for Patients: BloggerCourse.comhttps://www.fda.gov/media/152166/download  Fact Sheet for Healthcare Providers: SeriousBroker.ithttps://www.fda.gov/media/152162/download  This test is not yet approved or cleared by the [place] FDA and has been authorized for detection and/or diagnosis of SARS-CoV-2 by FDA under an Emergency Use Authorization (EUA). This EUA  will remain in effect (meaning this test can be used) for the duration of the COVID-19 declaration under Section 564(b)(1) of the Act, 21 U.S.C. section 360bbb-3(b)(1), unless the authorization is terminated or revoked.  Performed at [facility], [address] [place], [place] [address]   No results displayed because visit has over 200 results.    Admission on 06/02/2020, Discharged on 06/03/2020  Component Date Value Ref Range Status   Sodium 06/02/2020 139  135 - 145 mmol/L Final   Potassium  06/02/2020 5.0  3.5 - 5.1 mmol/L Final   Chloride 06/02/2020 107  98 - 111 mmol/L Final   CO2 06/02/2020 23  22 - 32 mmol/L Final   Glucose, Bld 06/02/2020 92  70 - 99 mg/dL Final   Glucose reference range applies only to samples taken after fasting for at least 8 hours.   BUN 06/02/2020 20  6 - 20 mg/dL Final   Creatinine, Ser 06/02/2020 1.53* 0.61 - 1.24 mg/dL Final   Calcium 21/30/8657 9.1  8.9 - 10.3 mg/dL Final   Total Protein 84/69/6295 6.7  6.5 - 8.1 g/dL Final   Albumin 28/41/3244 3.7  3.5 - 5.0 g/dL Final   AST 10/24/7251 34  15 - 41 U/L Final   ALT 06/02/2020 21  0 - 44 U/L Final   Alkaline Phosphatase 06/02/2020 254* 38 - 126 U/L Final   Total Bilirubin 06/02/2020 1.7* 0.3 - 1.2 mg/dL Final   GFR calc non Af Amer 06/02/2020 51* >60 mL/min Final   GFR calc Af Amer 06/02/2020 59* >60 mL/min Final   Anion gap 06/02/2020 9  5 - 15 Final   Performed at [facility], [address] [place], [place] [address]   WBC 06/02/2020 7.1  4.0 - 10.5 K/uL Final   RBC 06/02/2020 2.36* 4.22 - 5.81 MIL/uL Final   Hemoglobin 06/02/2020 7.3* 13.0 - 17.0 g/dL Final   HCT 34/74/2595 21.7* 39 - 52 % Final   MCV 06/02/2020 91.9  80.0 - 100.0 fL Final   MCH 06/02/2020 30.9  26.0 - 34.0 pg Final   MCHC 06/02/2020 33.6  30.0 - 36.0 g/dL Final   RDW 63/87/5643 24.3* 11.5 - 15.5 % Final   Platelets 06/02/2020 180  150 - 400 K/uL Final   nRBC 06/02/2020 5.2* 0.0 -  0.2 % Final   Neutrophils Relative % 06/02/2020 47  % Final   Neutro Abs 06/02/2020 3.3  1.7 - 7.7 K/uL Final   Lymphocytes Relative 06/02/2020 33  % Final   Lymphs Abs 06/02/2020 2.3  0.7 - 4.0 K/uL Final   Monocytes Relative 06/02/2020 17  % Final   Monocytes Absolute 06/02/2020 1.2* 0.1 - 1.0 K/uL Final   Eosinophils Relative 06/02/2020 3  % Final   Eosinophils Absolute 06/02/2020 0.2  0.0 - 0.5 K/uL Final   Basophils Relative 06/02/2020 0  % Final   Basophils Absolute 06/02/2020 0.0  0.0 - 0.1 K/uL Final   RBC Morphology 06/02/2020 Sickle cells present   Corrected   Immature Granulocytes 06/02/2020 0  % Final   Abs Immature Granulocytes 06/02/2020 0.03  0.00 - 0.07 K/uL Final   Performed at [facility], [address] [place], [place] [address]   Retic Ct Pct 06/02/2020 8.3* 0.4 - 3.1 % Final   RESULTS CONFIRMED BY MANUAL DILUTION   RBC. 06/02/2020 2.50* 4.22 - 5.81 MIL/uL Final   Retic Count, Absolute 06/02/2020 208.5* 19.0 - 186.0 K/uL Final   Immature Retic Fract 06/02/2020 41.9* 2.3 - 15.9 % Final   Performed at [facility], [address] [place], [place] [address]    Allergies: Aspirin and Tramadol  PTA Medications: (Not in a hospital admission)   Medical Decision Making  Medically cleared in ED    Recommendations  Based on my evaluation the patient does not appear to have an emergency medical condition.  Gerlene Burdock Alfard Cochrane, FNP 09/15/20  9:29 AM
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
## 2020-09-15 NOTE — ED Notes (Signed)
Ordered breakfast 

## 2020-09-15 NOTE — ED Notes (Signed)
PT belongings inventoried and put in locker #4- jackets, shoes, shirts, pants, lotions, etc.

## 2020-09-15 NOTE — ED Notes (Signed)
Breakfast given- oatmeal, milk, fruit cup

## 2020-09-15 NOTE — ED Notes (Signed)
54 yo male admitted to continuous assessment due to verbalizing suicidal thoughts. Pt denies HI/AVH. Labile, easily agitated, excessive use of profanity but able to be redirected. Pt reports being homeless and states, "I've been asking people to help but nobody wants to. I have sickle cell anemia, just had 3 blood transfusions and can't get into any shelter. I had double pneumonia. Can anyone help me"?

## 2020-09-15 NOTE — ED Notes (Signed)
Patient verbalizes understanding of transportation. Opportunity for questioning and answers were provided. Pt left via safe transport to [facility].

## 2020-09-15 NOTE — ED Notes (Signed)
Due to behaviors throughout day & evening patient patient being discharged. Patient cooperative with deputy in leaving observation area. For staff safety, per NP Nira Conn AVS provided to patient by deputy. Patient in no acute distress when escorted from observation area.

## 2020-09-15 NOTE — ED Notes (Signed)
Patient is verbally aggressive.  Patient expressed urgency to call about his personal belongings. Patient was assisted by NP and RN. Patient made phone call to hotel about his personal belongings became argumentative with Production designer, theatre/television/film.  Patient received negative response which escalated Patient's tone. Patient continues to ruminate over conversation.  Patient's tones continue to fluctuate on unit

## 2020-09-16 ENCOUNTER — Encounter (HOSPITAL_COMMUNITY): Payer: Self-pay | Admitting: Emergency Medicine

## 2020-09-16 ENCOUNTER — Encounter (HOSPITAL_COMMUNITY): Payer: Self-pay | Admitting: Student

## 2020-09-16 ENCOUNTER — Emergency Department (HOSPITAL_COMMUNITY)
Admission: EM | Admit: 2020-09-16 | Discharge: 2020-09-16 | Disposition: A | Discharge status: Home / Self Care | Payer: Medicare Other | Attending: Emergency Medicine | Admitting: Emergency Medicine

## 2020-09-16 ENCOUNTER — Other Ambulatory Visit: Payer: Self-pay

## 2020-09-16 ENCOUNTER — Emergency Department (HOSPITAL_COMMUNITY)
Admission: EM | Admit: 2020-09-16 | Discharge: 2020-09-16 | Disposition: A | Discharge status: Home / Self Care | Payer: Medicare Other | Source: Home / Self Care | Attending: Emergency Medicine | Admitting: Emergency Medicine

## 2020-09-16 DIAGNOSIS — D5700 Hb-SS disease with crisis, unspecified: Secondary | ICD-10-CM | POA: Diagnosis not present

## 2020-09-16 DIAGNOSIS — M545 Low back pain, unspecified: Secondary | ICD-10-CM | POA: Insufficient documentation

## 2020-09-16 DIAGNOSIS — F17290 Nicotine dependence, other tobacco product, uncomplicated: Secondary | ICD-10-CM | POA: Insufficient documentation

## 2020-09-16 DIAGNOSIS — R52 Pain, unspecified: Secondary | ICD-10-CM | POA: Diagnosis not present

## 2020-09-16 DIAGNOSIS — Z5902 Unsheltered homelessness: Secondary | ICD-10-CM | POA: Insufficient documentation

## 2020-09-16 DIAGNOSIS — R61 Generalized hyperhidrosis: Secondary | ICD-10-CM | POA: Diagnosis not present

## 2020-09-16 DIAGNOSIS — M549 Dorsalgia, unspecified: Secondary | ICD-10-CM | POA: Insufficient documentation

## 2020-09-16 DIAGNOSIS — R5381 Other malaise: Secondary | ICD-10-CM | POA: Diagnosis not present

## 2020-09-16 DIAGNOSIS — R531 Weakness: Secondary | ICD-10-CM | POA: Diagnosis not present

## 2020-09-16 DIAGNOSIS — R0902 Hypoxemia: Secondary | ICD-10-CM | POA: Diagnosis not present

## 2020-09-16 DIAGNOSIS — I10 Essential (primary) hypertension: Secondary | ICD-10-CM | POA: Diagnosis not present

## 2020-09-16 MED ORDER — OXYCODONE-ACETAMINOPHEN 5-325 MG PO TABS
2.0000 | ORAL_TABLET | Freq: Once | ORAL | Status: AC
  Administered 2020-09-16: 2 via ORAL
  Filled 2020-09-16: qty 2
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
## 2020-09-16 NOTE — ED Provider Notes (Signed)
[facility]-EMERGENCY DEPT Provider Note   CSN: [id number] Arrival date & time: 09/16/20  0114     History Chief Complaint  Patient presents with  . Back Pain    Scott Norton is a 54 y.o. male with a history of tobacco abuse, sickle cell anemia, homelessness, & depression who presents to the ED via EMS due to not wanting to face the elements and back discomfort. Patient states he was recently at [facility] and was made to leave due to a verbal altercation with another patient. He states that he was sent out into the cold and has had to drag his large heavy back around which has lead to him having lower back pain. Worse with movement. No alleviating factors. Hx of similar pain in the past. Does not have his prescribed oxycodone for pain which he takes for sickle cell. Denies numbness, tingling, weakness, saddle anesthesia, incontinence to bowel/bladder, fever, chills, IV drug use, dysuria, or hx of cancer. Patient has not had prior back surgeries.   HPI     Past Medical History:  Diagnosis Date  . Anemia   . Depression   . Heart murmur   . Pneumonia    ?ACS requring intubation  . Seasonal allergies    wool & grass  . Sickle cell anemia [facility])     Patient Active Problem List   Diagnosis Date Noted  . Homelessness 09/14/2020  . Verbalizes suicidal thoughts 09/04/2020  . Tobacco use 09/04/2020  . Sickle cell pain crisis (HCC) 04/11/2015  . Respiratory infection 08/13/2012  . Healthcare maintenance 06/04/2012  . Cervical strain, acute 12/09/2011  . Sickle cell anemia (HCC) 11/29/2011  . Seasonal allergies 11/29/2011    Past Surgical History:  Procedure Laterality Date  . ROOT CANAL         Family History  Problem Relation Age of Onset  . Alcohol abuse Mother   . Sickle cell trait Mother   . Hypertension Mother   . Alcohol abuse Father   . Sickle cell trait Father   . Early death Father        due to alcoholism  . Hypertension Father   .  Hypertension Sister   . Hypertension Brother   . Hypertension Maternal Grandmother   . Hypertension Maternal Grandfather   . Hypertension Paternal Grandmother   . Hypertension Paternal Grandfather     Social History   Tobacco Use  . Smoking status: Current Every Day Smoker    Packs/day: 0.50    Types: Cigars  . Smokeless tobacco: Never Used  Substance Use Topics  . Alcohol use: Yes    Comment: occasional but indicated etoh abuse on health hx questionaire  . Drug use: Not Currently    Home Medications Prior to Admission medications   Medication Sig Start Date End Date Taking? Authorizing Provider  ergocalciferol (VITAMIN D2) 1.25 MG (50000 UT) capsule Take 50,000 Units by mouth every 30 (thirty) days.  07/07/20 07/07/21 Yes [provider]  oxyCODONE-acetaminophen (PERCOCET) 10-325 MG tablet Take 1 tablet by mouth every 6 (six) hours as needed for pain.   Yes [provider]    Allergies    Aspirin and Tramadol  Review of Systems   Review of Systems  Constitutional: Negative for chills, fever and unexpected weight change.  Respiratory: Negative for cough and shortness of breath.   Gastrointestinal: Negative for abdominal pain, nausea and vomiting.  Genitourinary: Negative for dysuria.  Musculoskeletal: Positive for back pain.  Neurological: Negative for  weakness and numbness.       Negative for saddle anesthesia or bowel/bladder incontinence.   All other systems reviewed and are negative.   Physical Exam Updated Vital Signs BP (!) 165/72 (BP Location: Right Arm)   Pulse 96   Temp 98.4 F (36.9 C) (Oral)   Resp 18   Ht 6\' 3"  (1.905 m)   Wt 70.3 kg   SpO2 94%   BMI 19.37 kg/m   Physical Exam Vitals and nursing note reviewed.  Constitutional:      General: He is not in acute distress.    Appearance: He is well-developed. He is not toxic-appearing.  HENT:     Head: Normocephalic and atraumatic.  Eyes:     General:        Right eye: No  discharge.        Left eye: No discharge.     Conjunctiva/sclera: Conjunctivae normal.  Cardiovascular:     Rate and Rhythm: Normal rate and regular rhythm.  Pulmonary:     Effort: Pulmonary effort is normal. No respiratory distress.     Breath sounds: Normal breath sounds. No wheezing, rhonchi or rales.  Abdominal:     General: There is no distension.     Palpations: Abdomen is soft.     Tenderness: There is no abdominal tenderness.  Musculoskeletal:     Cervical back: Normal range of motion and neck supple. No spinous process tenderness or muscular tenderness.     Comments: No obvious deformity, appreciable swelling, erythema, ecchymosis, significant open wounds, or increased warmth.  Extremities: Normal ROM. Nontender.  Back: No point/focal vertebral tenderness, no palpable step off or crepitus. Diffuse lumbar tenderness to bilateral paraspinal muscles   Skin:    General: Skin is warm and dry.     Findings: No rash.  Neurological:     Mental Status: He is alert.     Deep Tendon Reflexes:     Reflex Scores:      Patellar reflexes are 2+ on the right side and 2+ on the left side.    Comments: Sensation grossly intact to bilateral lower extremities. 5/5 symmetric strength with plantar/dorsiflexion bilaterally. Gait is intact without obvious foot drop.   Psychiatric:        Behavior: Behavior normal.     ED Results / Procedures / Treatments   Labs (all labs ordered are listed, but only abnormal results are displayed) Labs Reviewed - No data to display  EKG None  Radiology No results found.  Procedures Procedures (including critical care time)  Medications Ordered in ED Medications  oxyCODONE-acetaminophen (PERCOCET/ROXICET) 5-325 MG per tablet 2 tablet (2 tablets Oral Given 09/16/20 0154)    ED Course  I have reviewed the triage vital signs and the nursing notes.  Pertinent labs & imaging results that were available during my care of the patient were reviewed by me  and considered in my medical decision making (see chart for details).    MDM Rules/Calculators/A&P                         Patient presents to the ED with complaints of the cold weather and back discomfort with having to transport his heavy bag after being discharged from [facility]. Patient is nontoxic, resting comfortably, vitals without significant abnormality.   Additional history obtained:  Additional history obtained from chart review & nursing note review. Previous records obtained and reviewed:  Recent hospital admission 11/14-11/23 for sickle cell pain  crisis, hgb 6.5- transfused accordingly. He was sent in prescriptions for analgesics to the pharmacy at time of discharge.  Seen in the ED Last night for SI & lower back pain- labs done at that time with hgb/hct improved from prior. Was given oral pain medication ultimately medically cleared and transferred to [facility]. Per [facility] note felt to be low risk from a SI standpoint, and was discharged due to aggressive behavior.   Patient has hx of prior back pain. No recent trauma. Normal neurologic exam, no point/focal midline tenderness to palpation. Ambulatory in the ED.  No back pain red flags. No urinary sxs. Suspect muscular pain. Considered UTI/pyelonephritis, kidney stone, aortic aneurysm/dissection, cauda equina or epidural abscess however these do not feel these diagnoses fit clinical picture at this time. Patient does have hx of sickle cell anemia but has not taken his prescribed percocet- will give dose of this in the ED & reassess.   On re-evaluation patient woken from sleep he is feeling much better. Do not feel further analgesics, imaging, or labs are necessary emergently at this time. Patient was allowed to sleep and rest comfortably in the emergency department overnight. He does have a social work team in place helping with shelter according to some prior notes.. Will provide additional resources. I discussed treatment plan, need for follow-up,  and return precautions with the patient. Provided opportunity for questions, patient confirmed understanding and is in agreement with plan. Findings and plan of care discussed with supervising physician Dr. Bebe Shaggy who is in agreement.   Portions of this note were generated with Scientist, clinical (histocompatibility and immunogenetics). Dictation errors may occur despite best attempts at proofreading.  Final Clinical Impression(s) / ED Diagnoses Final diagnoses:  Bilateral low back pain, unspecified chronicity, unspecified whether sciatica present    Rx / DC Orders ED Discharge Orders    None       Cherly Anderson, PA-C 09/16/20 1062    Zadie Rhine, MD 09/16/20 [phone]

## 2020-09-16 NOTE — ED Notes (Signed)
This writer attempted to get discharge vital signs from pt. Pt stated "Y'all need to watch how y'all talk to people." This writer tried to explain to the pt that is I was not trying to be disrespectful. Pt stated, "We'll go ahead and call security, by the time they get here, y'all's jaws will already be broke." This writer explained to the pt that threats were not acceptable. Pt stated "Fuck you, you're not getting your vital signs." Pt ripped off his bp cuff and threw it behind him. Security notified.

## 2020-09-16 NOTE — ED Notes (Signed)
Lilli at bedside. Pt started getting irate as he is not able to stay in the ED.

## 2020-09-16 NOTE — ED Provider Notes (Signed)
[facility] EMERGENCY DEPARTMENT Provider Note   CSN: [id number] Arrival date & time: 09/16/20  1824     History Chief Complaint  Patient presents with  . Sickle Cell    Scott Norton is a 54 y.o. male.  54 year old male with history of sickle cell disease and homelessness who presents with back discomfort.  Patient states that he was told that he has a pulled muscle in his back.  Denies any distal numbness or tingling.  No bowel or bladder dysfunction.  Denies any fever, cough, congestion.  Able to walk appropriately.  Is also requesting help with finding a place to stay        Past Medical History:  Diagnosis Date  . Anemia   . Depression   . Heart murmur   . Pneumonia    ?ACS requring intubation  . Seasonal allergies    wool & grass  . Sickle cell anemia [facility])     Patient Active Problem List   Diagnosis Date Noted  . Homelessness 09/14/2020  . Verbalizes suicidal thoughts 09/04/2020  . Tobacco use 09/04/2020  . Sickle cell pain crisis (HCC) 04/11/2015  . Respiratory infection 08/13/2012  . Healthcare maintenance 06/04/2012  . Cervical strain, acute 12/09/2011  . Sickle cell anemia (HCC) 11/29/2011  . Seasonal allergies 11/29/2011    Past Surgical History:  Procedure Laterality Date  . ROOT CANAL         Family History  Problem Relation Age of Onset  . Alcohol abuse Mother   . Sickle cell trait Mother   . Hypertension Mother   . Alcohol abuse Father   . Sickle cell trait Father   . Early death Father        due to alcoholism  . Hypertension Father   . Hypertension Sister   . Hypertension Brother   . Hypertension Maternal Grandmother   . Hypertension Maternal Grandfather   . Hypertension Paternal Grandmother   . Hypertension Paternal Grandfather     Social History   Tobacco Use  . Smoking status: Current Every Day Smoker    Packs/day: 0.50    Types: Cigars  . Smokeless tobacco: Never Used  Substance Use Topics  .  Alcohol use: Yes    Comment: occasional but indicated etoh abuse on health hx questionaire  . Drug use: Not Currently    Home Medications Prior to Admission medications   Medication Sig Start Date End Date Taking? Authorizing Provider  ergocalciferol (VITAMIN D2) 1.25 MG (50000 UT) capsule Take 50,000 Units by mouth every 30 (thirty) days.  07/07/20 07/07/21  [provider]  oxyCODONE-acetaminophen (PERCOCET) 10-325 MG tablet Take 1 tablet by mouth every 6 (six) hours as needed for pain.    [provider]    Allergies    Aspirin and Tramadol  Review of Systems   Review of Systems  All other systems reviewed and are negative.   Physical Exam Updated Vital Signs BP 115/87 (BP Location: Left Arm)   Pulse 82   Temp 99.1 F (37.3 C) (Oral)   Resp 18   Ht 1.905 m (6\' 3" )   Wt 68 kg   SpO2 95%   BMI 18.75 kg/m   Physical Exam Vitals and nursing note reviewed.  Constitutional:      General: He is not in acute distress.    Appearance: Normal appearance. He is well-developed. He is not toxic-appearing.  HENT:     Head: Normocephalic and atraumatic.  Eyes:  General: Lids are normal.     Conjunctiva/sclera: Conjunctivae normal.     Pupils: Pupils are equal, round, and reactive to light.  Neck:     Thyroid: No thyroid mass.     Trachea: No tracheal deviation.  Cardiovascular:     Rate and Rhythm: Normal rate and regular rhythm.     Heart sounds: Normal heart sounds. No murmur heard.  No gallop.   Pulmonary:     Effort: Pulmonary effort is normal. No respiratory distress.     Breath sounds: Normal breath sounds. No stridor. No decreased breath sounds, wheezing, rhonchi or rales.  Abdominal:     General: Bowel sounds are normal. There is no distension.     Palpations: Abdomen is soft.     Tenderness: There is no abdominal tenderness. There is no rebound.  Musculoskeletal:        General: No tenderness. Normal range of motion.     Cervical back: Normal  range of motion and neck supple.     Comments: Patient nontender along lumbar and thoracic spine.  Skin:    General: Skin is warm and dry.     Findings: No abrasion or rash.  Neurological:     Mental Status: He is alert and oriented to person, place, and time.     GCS: GCS eye subscore is 4. GCS verbal subscore is 5. GCS motor subscore is 6.     Cranial Nerves: No cranial nerve deficit.     Sensory: No sensory deficit.  Psychiatric:        Speech: Speech normal.        Behavior: Behavior normal.     ED Results / Procedures / Treatments   Labs (all labs ordered are listed, but only abnormal results are displayed) Labs Reviewed - No data to display  EKG None  Radiology No results found.  Procedures Procedures (including critical care time)  Medications Ordered in ED Medications - No data to display  ED Course  I have reviewed the triage vital signs and the nursing notes.  Pertinent labs & imaging results that were available during my care of the patient were reviewed by me and considered in my medical decision making (see chart for details).    MDM Rules/Calculators/A&P                          We will give patient resource guide to find shelters.  He is not in sickle cell crisis at this time.  Vital signs are stable. Final Clinical Impression(s) / ED Diagnoses Final diagnoses:  None    Rx / DC Orders ED Discharge Orders    None       Lorre Nick, MD 09/16/20 2155

## 2020-09-16 NOTE — ED Notes (Signed)
Discharge instructions gone over with pt. Resources regarding shelter provided and pt states "I have called these numbers before and I have to go in a wait list".

## 2020-09-16 NOTE — ED Triage Notes (Addendum)
Pt to ED via [facility] with c/o sickle cell pain.  Pt was seen at [facility] this am and was given a Percocet and discharged.  Pt st's they would not let him stay in the lobby and he has been out in the cold all day causing him to hurt worse. Pt also c/o lower back pain  Pt is homeless

## 2020-09-16 NOTE — Discharge Instructions (Signed)
You were seen in the emergency department today for lower back pain.  Please see attached resources for shelter.  Follow up with primary care within 1 week.  Return to the ER for new or worsening symptoms or any other concerns.

## 2020-09-21 ENCOUNTER — Telehealth (HOSPITAL_COMMUNITY): Payer: Self-pay

## 2020-09-21 NOTE — Telephone Encounter (Signed)
Care Management - Follow Up [facility] Discharges   Writer spoke to the patient.   Patient reports that he feels fine and does not want to receive any follow up mental health appointments.

## 2020-09-28 ENCOUNTER — Encounter (HOSPITAL_COMMUNITY): Payer: Self-pay | Admitting: Emergency Medicine

## 2020-09-28 ENCOUNTER — Emergency Department (HOSPITAL_COMMUNITY)
Admission: EM | Admit: 2020-09-28 | Discharge: 2020-09-29 | Disposition: A | Discharge status: Home / Self Care | Payer: Medicare Other | Attending: Emergency Medicine | Admitting: Emergency Medicine

## 2020-09-28 ENCOUNTER — Other Ambulatory Visit: Payer: Self-pay

## 2020-09-28 DIAGNOSIS — F4321 Adjustment disorder with depressed mood: Secondary | ICD-10-CM | POA: Insufficient documentation

## 2020-09-28 DIAGNOSIS — D5700 Hb-SS disease with crisis, unspecified: Secondary | ICD-10-CM

## 2020-09-28 DIAGNOSIS — R45851 Suicidal ideations: Secondary | ICD-10-CM | POA: Diagnosis not present

## 2020-09-28 DIAGNOSIS — Z20822 Contact with and (suspected) exposure to covid-19: Secondary | ICD-10-CM | POA: Diagnosis not present

## 2020-09-28 DIAGNOSIS — F17290 Nicotine dependence, other tobacco product, uncomplicated: Secondary | ICD-10-CM | POA: Diagnosis not present

## 2020-09-28 DIAGNOSIS — D57219 Sickle-cell/Hb-C disease with crisis, unspecified: Secondary | ICD-10-CM | POA: Insufficient documentation

## 2020-09-28 DIAGNOSIS — R52 Pain, unspecified: Secondary | ICD-10-CM | POA: Diagnosis not present

## 2020-09-28 DIAGNOSIS — F32A Depression, unspecified: Secondary | ICD-10-CM | POA: Insufficient documentation

## 2020-09-28 DIAGNOSIS — F320 Major depressive disorder, single episode, mild: Secondary | ICD-10-CM

## 2020-09-28 LAB — COMPREHENSIVE METABOLIC PANEL
ALT: 16 U/L (ref 0–44)
AST: 30 U/L (ref 15–41)
Albumin: 3.6 g/dL (ref 3.5–5.0)
Alkaline Phosphatase: 230 U/L — ABNORMAL HIGH (ref 38–126)
Anion gap: 9 (ref 5–15)
BUN: 10 mg/dL (ref 6–20)
CO2: 24 mmol/L (ref 22–32)
Calcium: 8.9 mg/dL (ref 8.9–10.3)
Chloride: 106 mmol/L (ref 98–111)
Creatinine, Ser: 1.22 mg/dL (ref 0.61–1.24)
GFR, Estimated: >60 mL/min (ref >60)
Glucose, Bld: 82 mg/dL (ref 70–99)
Potassium: 4 mmol/L (ref 3.5–5.1)
Sodium: 139 mmol/L (ref 135–145)
Total Bilirubin: 1.8 mg/dL — ABNORMAL HIGH (ref 0.3–1.2)
Total Protein: 6.8 g/dL (ref 6.5–8.1)

## 2020-09-28 LAB — CBC
HCT: 25.7 % — ABNORMAL LOW (ref 39.0–52.0)
Hemoglobin: 8.5 g/dL — ABNORMAL LOW (ref 13.0–17.0)
MCH: 32 pg (ref 26.0–34.0)
MCHC: 33.1 g/dL (ref 30.0–36.0)
MCV: 96.6 fL (ref 80.0–100.0)
Platelets: 253 10*3/uL (ref 150–400)
RBC: 2.66 MIL/uL — ABNORMAL LOW (ref 4.22–5.81)
RDW: 19.8 % — ABNORMAL HIGH (ref 11.5–15.5)
WBC: 6.7 10*3/uL (ref 4.0–10.5)
nRBC: 5.8 % — ABNORMAL HIGH (ref 0.0–0.2)

## 2020-09-28 LAB — ETHANOL: Alcohol, Ethyl (B): <10 mg/dL (ref <10)

## 2020-09-28 LAB — RETICULOCYTES
Immature Retic Fract: 27 % — ABNORMAL HIGH (ref 2.3–15.9)
RBC.: 2.69 MIL/uL — ABNORMAL LOW (ref 4.22–5.81)
Retic Count, Absolute: 399.5 10*3/uL — ABNORMAL HIGH (ref 19.0–186.0)
Retic Ct Pct: 14.8 % — ABNORMAL HIGH (ref 0.4–3.1)

## 2020-09-28 LAB — RAPID URINE DRUG SCREEN, HOSP PERFORMED
Amphetamines: NOT DETECTED
Barbiturates: NOT DETECTED
Benzodiazepines: NOT DETECTED
Cocaine: POSITIVE — AB
Opiates: NOT DETECTED
Tetrahydrocannabinol: POSITIVE — AB

## 2020-09-28 LAB — SALICYLATE LEVEL: Salicylate Lvl: <7 mg/dL — ABNORMAL LOW (ref 7.0–30.0)

## 2020-09-28 LAB — ACETAMINOPHEN LEVEL: Acetaminophen (Tylenol), Serum: <10 ug/mL — ABNORMAL LOW (ref 10–30)

## 2020-09-28 MED ORDER — OXYCODONE HCL 5 MG PO TABS
5.0000 mg | ORAL_TABLET | Freq: Four times a day (QID) | ORAL | Status: DC | PRN
  Administered 2020-09-28: 5 mg via ORAL
  Filled 2020-09-28: qty 1

## 2020-09-28 MED ORDER — OXYCODONE-ACETAMINOPHEN 10-325 MG PO TABS: 1.0000 | ORAL_TABLET | Freq: Four times a day (QID) | ORAL | Status: DC | PRN

## 2020-09-28 MED ORDER — OXYCODONE-ACETAMINOPHEN 5-325 MG PO TABS
2.0000 | ORAL_TABLET | Freq: Once | ORAL | Status: AC
  Administered 2020-09-28: 2 via ORAL
  Filled 2020-09-28: qty 2

## 2020-09-28 MED ORDER — OXYCODONE-ACETAMINOPHEN 5-325 MG PO TABS: 1.0000 | ORAL_TABLET | Freq: Four times a day (QID) | ORAL | Status: DC | PRN

## 2020-09-28 MED ORDER — KETOROLAC TROMETHAMINE 30 MG/ML IJ SOLN
30.0000 mg | Freq: Once | INTRAMUSCULAR | Status: AC
  Administered 2020-09-28: 30 mg via INTRAMUSCULAR
  Filled 2020-09-28: qty 1

## 2020-09-28 NOTE — ED Triage Notes (Signed)
Pt BIBA from street-  Per EMS- Pt c/o SCC since Monday, worse in back and legs.  Also c/o bilateral ankle pain.  Pt at c/o SI due to depression.  Denies HI at this time.     Pt calm and cooperative at time of triage.

## 2020-09-28 NOTE — ED Provider Notes (Addendum)
[facility]-EMER[facility] DEPT Provider Note   CSN: [id number] Arrival date & time: 09/28/20  1636     History Chief Complaint  Patient presents with  . Sickle Cell Pain Crisis  . Depression    Scott Norton is a 54 y.o. male.  HPI Patient presents with 2 complaints.  Complaining of pain everywhere.  History of sickle cell disease.  States it is back his legs.  States he has some swelling in his ankles and pain in his ankles.  States there is no swelling his ankles.  States he is out of his pain meds for a few days now so the pain is severe.  States it is in his chest it is typical pain for him.  No fevers.  No cough. Also states he is suicidal.  History of same.  States he is homeless and this is the center of most of his suicidality.  Has been recently seen at behavioral health urgent care for the same.  Reviewing records they called him about follow-up and patient states he did not want follow-up.  However patient states now he is still suicidal he was just doing well at that time and that is what he thought they were asking about.  States he may have a plan but will not really tell me what it is.    Past Medical History:  Diagnosis Date  . Anemia   . Depression   . Heart murmur   . Pneumonia    ?ACS requring intubation  . Seasonal allergies    wool & grass  . Sickle cell anemia [facility])     Patient Active Problem List   Diagnosis Date Noted  . Homelessness 09/14/2020  . Verbalizes suicidal thoughts 09/04/2020  . Tobacco use 09/04/2020  . Sickle cell pain crisis (HCC) 04/11/2015  . Respiratory infection 08/13/2012  . Healthcare maintenance 06/04/2012  . Cervical strain, acute 12/09/2011  . Sickle cell anemia (HCC) 11/29/2011  . Seasonal allergies 11/29/2011    Past Surgical History:  Procedure Laterality Date  . ROOT CANAL         Family History  Problem Relation Age of Onset  . Alcohol abuse Mother   . Sickle cell trait Mother   . Hypertension  Mother   . Alcohol abuse Father   . Sickle cell trait Father   . Early death Father        due to alcoholism  . Hypertension Father   . Hypertension Sister   . Hypertension Brother   . Hypertension Maternal Grandmother   . Hypertension Maternal Grandfather   . Hypertension Paternal Grandmother   . Hypertension Paternal Grandfather     Social History   Tobacco Use  . Smoking status: Current Every Day Smoker    Packs/day: 0.50    Types: Cigars  . Smokeless tobacco: Never Used  Substance Use Topics  . Alcohol use: Yes    Comment: occasional but indicated etoh abuse on health hx questionaire  . Drug use: Not Currently    Home Medications Prior to Admission medications   Medication Sig Start Date End Date Taking? Authorizing Provider  ergocalciferol (VITAMIN D2) 1.25 MG (50000 UT) capsule Take 50,000 Units by mouth every 30 (thirty) days.  07/07/20 07/07/21 Yes [provider]  oxyCODONE-acetaminophen (PERCOCET) 10-325 MG tablet Take 1 tablet by mouth every 6 (six) hours as needed for pain.   Yes [provider]    Allergies    Aspirin and Tramadol  Review of  Systems   Review of Systems  Constitutional: Negative for appetite change and fever.  HENT: Negative for congestion.   Respiratory: Negative for shortness of breath.   Cardiovascular: Positive for chest pain.  Musculoskeletal: Negative for back pain.  Skin: Negative for rash.  Neurological: Negative for weakness.  Psychiatric/Behavioral: Negative for confusion.    Physical Exam Updated Vital Signs BP 124/69 (BP Location: Right Arm)   Pulse 73   Temp 98.7 F (37.1 C) (Oral)   Resp 19   Ht 6\' 3"  (1.905 m)   Wt 60.9 kg   SpO2 98%   BMI 16.77 kg/m   Physical Exam Vitals and nursing note reviewed.  HENT:     Head: Atraumatic.  Eyes:     Pupils: Pupils are equal, round, and reactive to light.  Cardiovascular:     Rate and Rhythm: Regular rhythm.  Pulmonary:     Breath sounds: Normal  breath sounds.  Musculoskeletal:        General: No tenderness.     Cervical back: Neck supple.     Comments: Mild pitting edema bilateral ankles  Skin:    General: Skin is warm.     Capillary Refill: Capillary refill takes less than 2 seconds.  Neurological:     Mental Status: He is alert and oriented to person, place, and time.  Psychiatric:        Behavior: Behavior normal.     ED Results / Procedures / Treatments   Labs (all labs ordered are listed, but only abnormal results are displayed) Labs Reviewed  COMPREHENSIVE METABOLIC PANEL - Abnormal; Notable for the following components:      Result Value   Alkaline Phosphatase 230 (*)    Total Bilirubin 1.8 (*)    All other components within normal limits  SALICYLATE LEVEL - Abnormal; Notable for the following components:   Salicylate Lvl <7.0 (*)    All other components within normal limits  ACETAMINOPHEN LEVEL - Abnormal; Notable for the following components:   Acetaminophen (Tylenol), Serum <10 (*)    All other components within normal limits  CBC - Abnormal; Notable for the following components:   RBC 2.66 (*)    Hemoglobin 8.5 (*)    HCT 25.7 (*)    RDW 19.8 (*)    nRBC 5.8 (*)    All other components within normal limits  RETICULOCYTES - Abnormal; Notable for the following components:   Retic Ct Pct 14.8 (*)    RBC. 2.69 (*)    Retic Count, Absolute 399.5 (*)    Immature Retic Fract 27.0 (*)    All other components within normal limits  RESP PANEL BY RT-PCR (FLU A&B, COVID) ARPGX2  ETHANOL  RAPID URINE DRUG SCREEN, HOSP PERFORMED    EKG None  Radiology No results found.  Procedures Procedures (including critical care time)  Medications Ordered in ED Medications  oxyCODONE-acetaminophen (PERCOCET/ROXICET) 5-325 MG per tablet 2 tablet (has no administration in time range)  ketorolac (TORADOL) 30 MG/ML injection 30 mg (has no administration in time range)  oxyCODONE-acetaminophen (PERCOCET/ROXICET)  5-325 MG per tablet 1 tablet (has no administration in time range)    And  oxyCODONE (Oxy IR/ROXICODONE) immediate release tablet 5 mg (has no administration in time range)    ED Course  I have reviewed the triage vital signs and the nursing notes.  Pertinent labs & imaging results that were available during my care of the patient were reviewed by me and considered in my medical  decision making (see chart for details).    MDM Rules/Calculators/A&P                          Patient presents with sickle cell pain.  Lab work reassuring.  Doubt severe pain at this time.  Has been off his oral medications and started with it.  Bilirubin only minimally elevated.  Patient has tolerating orals.  Does not appear to be in acute distress.  Will give short Toradol and his oral medications Also complaining of depression and suicidal thoughts.  States he has a plan but does not say what it is.  I think likely there is some secondary gain trying to find a place to stay.  However has previously stated that he does not want resources.  Will have patient seen by TTS.  Patient is medically cleared. Final Clinical Impression(s) / ED Diagnoses Final diagnoses:  Sickle cell anemia with pain (HCC)  Depression, unspecified depression type    Rx / DC Orders ED Discharge Orders    None       Benjiman Core, MD 09/28/20 1610    Benjiman Core, MD 09/28/20 [phone]

## 2020-09-29 LAB — RESP PANEL BY RT-PCR (FLU A&B, COVID) ARPGX2
Influenza A by PCR: NEGATIVE
Influenza B by PCR: NEGATIVE
SARS Coronavirus 2 by RT PCR: NEGATIVE

## 2020-09-29 MED ORDER — OXYCODONE HCL 5 MG PO TABS
5.0000 mg | ORAL_TABLET | Freq: Once | ORAL | Status: AC
  Administered 2020-09-29: 5 mg via ORAL
  Filled 2020-09-29: qty 1

## 2020-09-29 NOTE — ED Notes (Signed)
Pt belongings place in 1-8 nursing station.
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
## 2020-09-29 NOTE — ED Notes (Signed)
Patient placed on cardiac monitor. Patient's ( 2 white bags) belongings placed on cabinet over 5-8 nurses station. Black duffle bag placed on floor at 1-8 nurses station

## 2020-09-29 NOTE — BHH Counselor (Signed)
Clinician spoke to [place], [place] to set up TTS cart. Clinician to call cart in 5 minutes.     Redmond Pulling, MS, [facility], [facility] Triage Specialist [phone]
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
## 2020-10-12 NOTE — Progress Notes (Deleted)
Patient ID: Scott Norton, male   DOB: [birth year], 54 y.o.   MRN: [id number]  after multiple visits to ED;  Most recently 10/08/2020:  HPI Patient presents with 2 complaints.  Complaining of pain everywhere.  History of sickle cell disease.  States it is back his legs.  States he has some swelling in his ankles and pain in his ankles.  States there is no swelling his ankles.  States he is out of his pain meds for a few days now so the pain is severe.  States it is in his chest it is typical pain for him.  No fevers.  No cough. Also states he is suicidal.  History of same.  States he is homeless and this is the center of most of his suicidality.  Has been recently seen at behavioral health urgent care for the same.  Reviewing records they called him about follow-up and patient states he did not want follow-up.  However patient states now he is still suicidal he was just doing well at that time and that is what he thought they were asking about.  States he may have a plan but will not really tell me what it is.  Patient presents with sickle cell pain.  Lab work reassuring.  Doubt severe pain at this time.  Has been off his oral medications and started with it.  Bilirubin only minimally elevated.  Patient has tolerating orals.  Does not appear to be in acute distress.  Will give short Toradol and his oral medications Also complaining of depression and suicidal thoughts.  States he has a plan but does not say what it is.  I think likely there is some secondary gain trying to find a place to stay.  However has previously stated that he does not want resources.  Will have patient seen by TTS.  Patient is medically cleared.

## 2020-10-18 ENCOUNTER — Inpatient Hospital Stay: Payer: Medicare Other | Admitting: Physician Assistant

## 2020-11-07 ENCOUNTER — Encounter (HOSPITAL_COMMUNITY): Payer: Self-pay | Admitting: Emergency Medicine

## 2020-11-07 DIAGNOSIS — M79604 Pain in right leg: Secondary | ICD-10-CM | POA: Diagnosis not present

## 2020-11-07 DIAGNOSIS — Z5321 Procedure and treatment not carried out due to patient leaving prior to being seen by health care provider: Secondary | ICD-10-CM | POA: Diagnosis not present

## 2020-11-07 DIAGNOSIS — M7989 Other specified soft tissue disorders: Secondary | ICD-10-CM | POA: Diagnosis not present

## 2020-11-07 DIAGNOSIS — M79605 Pain in left leg: Secondary | ICD-10-CM | POA: Insufficient documentation

## 2020-11-07 LAB — CBC WITH DIFFERENTIAL/PLATELET
Abs Immature Granulocytes: 0.05 10*3/uL (ref 0.00–0.07)
Basophils Absolute: 0 10*3/uL (ref 0.0–0.1)
Basophils Relative: 1 %
Eosinophils Absolute: 0.2 10*3/uL (ref 0.0–0.5)
Eosinophils Relative: 3 %
HCT: 20.6 % — ABNORMAL LOW (ref 39.0–52.0)
Hemoglobin: 7.5 g/dL — ABNORMAL LOW (ref 13.0–17.0)
Immature Granulocytes: 1 %
Lymphocytes Relative: 30 %
Lymphs Abs: 1.9 10*3/uL (ref 0.7–4.0)
MCH: 35.2 pg — ABNORMAL HIGH (ref 26.0–34.0)
MCHC: 36.4 g/dL — ABNORMAL HIGH (ref 30.0–36.0)
MCV: 96.7 fL (ref 80.0–100.0)
Monocytes Absolute: 0.8 10*3/uL (ref 0.1–1.0)
Monocytes Relative: 13 %
Neutro Abs: 3.3 10*3/uL (ref 1.7–7.7)
Neutrophils Relative %: 52 %
Platelets: 209 10*3/uL (ref 150–400)
RBC: 2.13 MIL/uL — ABNORMAL LOW (ref 4.22–5.81)
RDW: 24.9 % — ABNORMAL HIGH (ref 11.5–15.5)
WBC: 6.4 10*3/uL (ref 4.0–10.5)
nRBC: 3 % — ABNORMAL HIGH (ref 0.0–0.2)

## 2020-11-07 LAB — COMPREHENSIVE METABOLIC PANEL
ALT: 26 U/L (ref 0–44)
AST: 42 U/L — ABNORMAL HIGH (ref 15–41)
Albumin: 3.6 g/dL (ref 3.5–5.0)
Alkaline Phosphatase: 214 U/L — ABNORMAL HIGH (ref 38–126)
Anion gap: 8 (ref 5–15)
BUN: 8 mg/dL (ref 6–20)
CO2: 23 mmol/L (ref 22–32)
Calcium: 8.9 mg/dL (ref 8.9–10.3)
Chloride: 107 mmol/L (ref 98–111)
Creatinine, Ser: 0.84 mg/dL (ref 0.61–1.24)
GFR, Estimated: >60 mL/min (ref >60)
Glucose, Bld: 94 mg/dL (ref 70–99)
Potassium: 4.2 mmol/L (ref 3.5–5.1)
Sodium: 138 mmol/L (ref 135–145)
Total Bilirubin: 1.8 mg/dL — ABNORMAL HIGH (ref 0.3–1.2)
Total Protein: 7.1 g/dL (ref 6.5–8.1)

## 2020-11-07 LAB — RETICULOCYTES: RBC.: 2.02 MIL/uL — ABNORMAL LOW (ref 4.22–5.81)

## 2020-11-07 NOTE — ED Triage Notes (Signed)
Per pt, states he was playing in snow-now having B/L leg pain

## 2020-11-07 NOTE — ED Triage Notes (Signed)
Per EMS-complaining of swelling in both legs-pain as well-symptoms for 1 month

## 2020-11-08 ENCOUNTER — Emergency Department (HOSPITAL_COMMUNITY)
Admission: EM | Admit: 2020-11-08 | Discharge: 2020-11-08 | Disposition: A | Discharge status: Home / Self Care | Payer: Medicare Other | Attending: Emergency Medicine | Admitting: Emergency Medicine

## 2020-11-08 NOTE — ED Notes (Signed)
Called 3x for room placement. Eloped from waiting area.  

## 2020-12-26 ENCOUNTER — Emergency Department (HOSPITAL_COMMUNITY)
Admission: EM | Admit: 2020-12-26 | Discharge: 2020-12-26 | Disposition: A | Discharge status: Home / Self Care | Payer: Medicare Other | Attending: Emergency Medicine | Admitting: Emergency Medicine

## 2020-12-26 ENCOUNTER — Emergency Department (HOSPITAL_COMMUNITY): Payer: Medicare Other

## 2020-12-26 ENCOUNTER — Encounter (HOSPITAL_COMMUNITY): Payer: Self-pay

## 2020-12-26 ENCOUNTER — Other Ambulatory Visit (HOSPITAL_COMMUNITY): Payer: Self-pay | Admitting: Emergency Medicine

## 2020-12-26 DIAGNOSIS — M5432 Sciatica, left side: Secondary | ICD-10-CM | POA: Diagnosis not present

## 2020-12-26 DIAGNOSIS — F17290 Nicotine dependence, other tobacco product, uncomplicated: Secondary | ICD-10-CM | POA: Diagnosis not present

## 2020-12-26 DIAGNOSIS — D571 Sickle-cell disease without crisis: Secondary | ICD-10-CM | POA: Insufficient documentation

## 2020-12-26 DIAGNOSIS — D649 Anemia, unspecified: Secondary | ICD-10-CM

## 2020-12-26 DIAGNOSIS — D500 Iron deficiency anemia secondary to blood loss (chronic): Secondary | ICD-10-CM | POA: Diagnosis not present

## 2020-12-26 DIAGNOSIS — M5430 Sciatica, unspecified side: Secondary | ICD-10-CM

## 2020-12-26 DIAGNOSIS — M549 Dorsalgia, unspecified: Secondary | ICD-10-CM | POA: Diagnosis present

## 2020-12-26 LAB — CBC WITH DIFFERENTIAL/PLATELET
Abs Immature Granulocytes: 0.05 10*3/uL (ref 0.00–0.07)
Basophils Absolute: 0 10*3/uL (ref 0.0–0.1)
Basophils Relative: 0 %
Eosinophils Absolute: 0.4 10*3/uL (ref 0.0–0.5)
Eosinophils Relative: 4 %
HCT: 20.6 % — ABNORMAL LOW (ref 39.0–52.0)
Hemoglobin: 7 g/dL — ABNORMAL LOW (ref 13.0–17.0)
Immature Granulocytes: 1 %
Lymphocytes Relative: 41 %
Lymphs Abs: 3.3 10*3/uL (ref 0.7–4.0)
MCH: 31.8 pg (ref 26.0–34.0)
MCHC: 34 g/dL (ref 30.0–36.0)
MCV: 93.6 fL (ref 80.0–100.0)
Monocytes Absolute: 0.9 10*3/uL (ref 0.1–1.0)
Monocytes Relative: 11 %
Neutro Abs: 3.4 10*3/uL (ref 1.7–7.7)
Neutrophils Relative %: 43 %
Platelets: 174 10*3/uL (ref 150–400)
RBC: 2.2 MIL/uL — ABNORMAL LOW (ref 4.22–5.81)
RDW: 25.5 % — ABNORMAL HIGH (ref 11.5–15.5)
WBC: 8 10*3/uL (ref 4.0–10.5)
nRBC: 15.2 % — ABNORMAL HIGH (ref 0.0–0.2)

## 2020-12-26 LAB — COMPREHENSIVE METABOLIC PANEL
ALT: 18 U/L (ref 0–44)
AST: 35 U/L (ref 15–41)
Albumin: 3.6 g/dL (ref 3.5–5.0)
Alkaline Phosphatase: 198 U/L — ABNORMAL HIGH (ref 38–126)
Anion gap: 6 (ref 5–15)
BUN: 13 mg/dL (ref 6–20)
CO2: 23 mmol/L (ref 22–32)
Calcium: 9 mg/dL (ref 8.9–10.3)
Chloride: 110 mmol/L (ref 98–111)
Creatinine, Ser: 1 mg/dL (ref 0.61–1.24)
GFR, Estimated: >60 mL/min (ref >60)
Glucose, Bld: 109 mg/dL — ABNORMAL HIGH (ref 70–99)
Potassium: 4.1 mmol/L (ref 3.5–5.1)
Sodium: 139 mmol/L (ref 135–145)
Total Bilirubin: 2.1 mg/dL — ABNORMAL HIGH (ref 0.3–1.2)
Total Protein: 7 g/dL (ref 6.5–8.1)

## 2020-12-26 LAB — RETICULOCYTES
Immature Retic Fract: 47.2 % — ABNORMAL HIGH (ref 2.3–15.9)
RBC.: 2.22 MIL/uL — ABNORMAL LOW (ref 4.22–5.81)
Retic Count, Absolute: 247.1 10*3/uL — ABNORMAL HIGH (ref 19.0–186.0)
Retic Ct Pct: 11.1 % — ABNORMAL HIGH (ref 0.4–3.1)

## 2020-12-26 MED ORDER — PREDNISONE 10 MG (21) PO TBPK: ORAL_TABLET | ORAL | 0 refills | Status: DC

## 2020-12-26 MED ORDER — OXYCODONE-ACETAMINOPHEN 10-325 MG PO TABS
1.0000 | ORAL_TABLET | ORAL | 0 refills | Status: DC | PRN
Start: 2020-12-26 — End: 2020-12-30

## 2020-12-26 MED ORDER — DEXAMETHASONE SODIUM PHOSPHATE 10 MG/ML IJ SOLN
10.0000 mg | Freq: Once | INTRAMUSCULAR | Status: AC
  Administered 2020-12-26: 10 mg via INTRAVENOUS
  Filled 2020-12-26: qty 1

## 2020-12-26 MED FILL — predniSONE 10 MG TABS: 10 | 12 days supply | Qty: 42 | Fill #0

## 2020-12-26 NOTE — ED Provider Notes (Signed)
[facility]-EMERGENCY DEPT Provider Note   CSN: [id number] Arrival date & time: 12/26/20  0946     History Chief Complaint  Patient presents with  . Sickle Cell Pain Crisis    Scott Norton is a 55 y.o. male.  Pt presents to the ED today with back pain radiating to his left buttock.  He has a hx of SCD type SS.  He is not sure if his pain is from her back or from his SCD.  Pt said he lives in [place], but has a SCD doctor at [facility].  He has difficulty with transportation.  He said he is able to walk, but it hurts.           Past Medical History:  Diagnosis Date  . Anemia   . Depression   . Heart murmur   . Pneumonia    ?ACS requring intubation  . Seasonal allergies    wool & grass  . Sickle cell anemia [facility])     Patient Active Problem List   Diagnosis Date Noted  . Homelessness 09/14/2020  . Verbalizes suicidal thoughts 09/04/2020  . Tobacco use 09/04/2020  . Sickle cell pain crisis (HCC) 04/11/2015  . Respiratory infection 08/13/2012  . Healthcare maintenance 06/04/2012  . Cervical strain, acute 12/09/2011  . Sickle cell anemia (HCC) 11/29/2011  . Seasonal allergies 11/29/2011    Past Surgical History:  Procedure Laterality Date  . ROOT CANAL         Family History  Problem Relation Age of Onset  . Alcohol abuse Mother   . Sickle cell trait Mother   . Hypertension Mother   . Alcohol abuse Father   . Sickle cell trait Father   . Early death Father        due to alcoholism  . Hypertension Father   . Hypertension Sister   . Hypertension Brother   . Hypertension Maternal Grandmother   . Hypertension Maternal Grandfather   . Hypertension Paternal Grandmother   . Hypertension Paternal Grandfather     Social History   Tobacco Use  . Smoking status: Current Every Day Smoker    Packs/day: 0.50    Types: Cigars  . Smokeless tobacco: Never Used  Substance Use Topics  . Alcohol use: Yes    Comment: occasional but indicated  etoh abuse on health hx questionaire  . Drug use: Not Currently    Home Medications Prior to Admission medications   Medication Sig Start Date End Date Taking? Authorizing Provider  ergocalciferol (VITAMIN D2) 1.25 MG (50000 UT) capsule Take 50,000 Units by mouth every 30 (thirty) days.  07/07/20 07/07/21 Yes [provider]  oxyCODONE-acetaminophen (PERCOCET) 10-325 MG tablet Take 1 tablet by mouth every 4 (four) hours as needed for pain. 12/26/20  Yes Jacalyn Lefevre, MD  predniSONE (STERAPRED UNI-PAK 21 TAB) 10 MG (21) TBPK tablet Take 6 tabs for 2 days, then 5 for 2 days, then 4 for 2 days, then 3 for 2 days, 2 for 2 days, then 1 for 2 days 12/26/20  Yes Jacalyn Lefevre, MD    Allergies    Aspirin and Tramadol  Review of Systems   Review of Systems  Musculoskeletal: Positive for back pain.  All other systems reviewed and are negative.   Physical Exam Updated Vital Signs BP 113/69 (BP Location: Left Arm)   Pulse 74   Temp 98.3 F (36.8 C) (Oral)   Resp 18   SpO2 100%   Physical Exam Vitals and  nursing note reviewed.  Constitutional:      Appearance: Normal appearance.  HENT:     Head: Normocephalic and atraumatic.     Right Ear: External ear normal.     Left Ear: External ear normal.     Nose: Nose normal.     Mouth/Throat:     Mouth: Mucous membranes are moist.     Pharynx: Oropharynx is clear.  Eyes:     Extraocular Movements: Extraocular movements intact.     Conjunctiva/sclera: Conjunctivae normal.     Pupils: Pupils are equal, round, and reactive to light.  Cardiovascular:     Rate and Rhythm: Normal rate and regular rhythm.     Pulses: Normal pulses.     Heart sounds: Normal heart sounds.  Pulmonary:     Effort: Pulmonary effort is normal.     Breath sounds: Normal breath sounds.  Abdominal:     General: Abdomen is flat. Bowel sounds are normal.     Palpations: Abdomen is soft.  Musculoskeletal:        General: Normal range of motion.     Cervical  back: Normal range of motion and neck supple.  Skin:    General: Skin is warm.     Capillary Refill: Capillary refill takes less than 2 seconds.  Neurological:     General: No focal deficit present.     Mental Status: He is alert and oriented to person, place, and time.  Psychiatric:        Mood and Affect: Mood normal.        Behavior: Behavior normal.        Thought Content: Thought content normal.        Judgment: Judgment normal.     ED Results / Procedures / Treatments   Labs (all labs ordered are listed, but only abnormal results are displayed) Labs Reviewed  COMPREHENSIVE METABOLIC PANEL - Abnormal; Notable for the following components:      Result Value   Glucose, Bld 109 (*)    Alkaline Phosphatase 198 (*)    Total Bilirubin 2.1 (*)    All other components within normal limits  CBC WITH DIFFERENTIAL/PLATELET - Abnormal; Notable for the following components:   RBC 2.20 (*)    Hemoglobin 7.0 (*)    HCT 20.6 (*)    RDW 25.5 (*)    nRBC 15.2 (*)    All other components within normal limits  RETICULOCYTES - Abnormal; Notable for the following components:   Retic Ct Pct 11.1 (*)    RBC. 2.22 (*)    Retic Count, Absolute 247.1 (*)    Immature Retic Fract 47.2 (*)    All other components within normal limits    EKG None  Radiology DG Lumbar Spine Complete  Result Date: 12/26/2020 CLINICAL DATA:  LEFT and RIGHT low back pain greatest on the LEFT for 1.5 months. History of sickle cell. EXAM: LUMBAR SPINE - COMPLETE 4+ VIEW COMPARISON:  None FINDINGS: Mild retrolisthesis of L5 on S1 approximately 3-4 mm. Vertebral body heights and disc spaces are maintained. Five lumbar type vertebral bodies with partial lumbarization of the S1 level. Evidence of aortic atherosclerosis. IMPRESSION: Mild retrolisthesis of L5 on S1.  No acute findings. Electronically Signed   By: Donzetta Kohut M.D.   On: 12/26/2020 12:19    Procedures Procedures   Medications Ordered in ED Medications   dexamethasone (DECADRON) injection 10 mg (10 mg Intravenous Given 12/26/20 1226)    ED Course  I have reviewed the triage vital signs and the nursing notes.  Pertinent labs & imaging results that were available during my care of the patient were reviewed by me and considered in my medical decision making (see chart for details).    MDM Rules/Calculators/A&P                          Pt is feeling better after the decadron.  His hgb today is 7.0.  His last doctor's note said his baseline hgb is 8.5, but it does not give a threshold upon which to transfuse.  Pt is not interested in getting a transfusion today.  Pt is stable for d/c.  He is to f/u with his SCD physician.  SW gave pt resources to obtain a pcp.  Return if worse. Final Clinical Impression(s) / ED Diagnoses Final diagnoses:  Acute sciatica  Sickle cell disease without crisis (HCC)  Chronic anemia    Rx / DC Orders ED Discharge Orders         Ordered    predniSONE (STERAPRED UNI-PAK 21 TAB) 10 MG (21) TBPK tablet        12/26/20 1338    oxyCODONE-acetaminophen (PERCOCET) 10-325 MG tablet  Every 4 hours PRN        12/26/20 1338           Jacalyn Lefevre, MD 12/26/20 1345

## 2020-12-26 NOTE — TOC Initial Note (Addendum)
Transition of Care [facility]) - Initial/Assessment Note    Patient Details  Name: Scott Norton MRN: [id number] Date of Birth: Jun 23, 1966  [facility] CM/SW Contact:    Elliot Cousin, RN Phone Number: [phone] 12/26/2020, 3:23 PM  Clinical Narrative:                  TOC CM spoke to pt and states he does want to go to the [facility]. Appt scheduled at [facility] 12/30/2020 at 2 pm. Contacted pt to follow up on his appt. ED provider updated.    Barriers to Discharge: No Barriers Identified   Patient Goals and CMS Choice        Expected Discharge Plan and Services  Prior Living Arrangements/Services  Activities of Daily Living      Permission Sought/Granted     Emotional Assessment  Admission diagnosis:  sickle cell pain, Back Pain Patient Active Problem List   Diagnosis Date Noted  . Homelessness 09/14/2020  . Verbalizes suicidal thoughts 09/04/2020  . Tobacco use 09/04/2020  . Sickle cell pain crisis (HCC) 04/11/2015  . Respiratory infection 08/13/2012  . Healthcare maintenance 06/04/2012  . Cervical strain, acute 12/09/2011  . Sickle cell anemia (HCC) 11/29/2011  . Seasonal allergies 11/29/2011   PCP:  Anders Simmonds, PA-C Pharmacy:   [facility] - [place] - [address] [place] [address] Phone: [phone] Fax: [phone]  [facility] - [place], [place] - [address] [place] [place] [address] Phone: [phone] Fax: [phone]     Social Determinants of Health (SDOH) Interventions    Readmission Risk Interventions No flowsheet data found.

## 2020-12-26 NOTE — ED Triage Notes (Signed)
Pt presents with c/o pain in his back, pt unsure whether it is related to his [facility] but believes that may be what it is. Pt reports the pain in his back has been present for approx 1.5 months.

## 2020-12-30 ENCOUNTER — Ambulatory Visit (INDEPENDENT_AMBULATORY_CARE_PROVIDER_SITE_OTHER): Payer: Medicare Other | Admitting: Nurse Practitioner

## 2020-12-30 ENCOUNTER — Telehealth: Payer: Self-pay | Admitting: Clinical

## 2020-12-30 ENCOUNTER — Other Ambulatory Visit (HOSPITAL_COMMUNITY): Payer: Self-pay | Admitting: Nurse Practitioner

## 2020-12-30 ENCOUNTER — Other Ambulatory Visit: Payer: Self-pay

## 2020-12-30 ENCOUNTER — Encounter: Payer: Self-pay | Admitting: Nurse Practitioner

## 2020-12-30 VITALS — BP 118/58 | HR 70 | Ht 75.0 in | Wt 158.0 lb

## 2020-12-30 DIAGNOSIS — F419 Anxiety disorder, unspecified: Secondary | ICD-10-CM

## 2020-12-30 DIAGNOSIS — G894 Chronic pain syndrome: Secondary | ICD-10-CM

## 2020-12-30 DIAGNOSIS — Z7689 Persons encountering health services in other specified circumstances: Secondary | ICD-10-CM | POA: Diagnosis not present

## 2020-12-30 DIAGNOSIS — D571 Sickle-cell disease without crisis: Secondary | ICD-10-CM

## 2020-12-30 DIAGNOSIS — F1910 Other psychoactive substance abuse, uncomplicated: Secondary | ICD-10-CM

## 2020-12-30 MED ORDER — OXYCODONE-ACETAMINOPHEN 10-325 MG PO TABS: 1.0000 | ORAL_TABLET | ORAL | 0 refills | Status: DC | PRN

## 2020-12-30 MED FILL — OXYCODONE-APAP 10-325: 10-325 | 2 days supply | Qty: 10 | Fill #0

## 2020-12-30 NOTE — Telephone Encounter (Signed)
Integrated Behavioral Health Case Management Referral Note  12/30/2020 Name: Scott Norton MRN: [id number] DOB: April 02, 1966 Scott Norton is a 55 y.o. year old male who sees Barbette Merino, NP for primary care. LCSW was consulted to assess patient's needs and assist the patient with housing and transportation.  Interpreter: No.   Interpreter Name & Language: none  Assessment: Patient experiencing Transportation and Housing barriers. Patient is currently homeless. He stays in hotels when he can. He receives [employer] income. He uses the bus for transportation.  Intervention: CSW assessed patient needs for community resources. Patient in need of stable housing. He reported he pays for a hotel when he can, but his disability income is not enough for the full month in the hotel. Referred patient to the [facility]), but patient reported he has been there and seen a caseworker who gave him a list of housing to apply for already. He states that none of these housing options were viable and/or he could not reach the landlords after several tries. Advised patient that [employer] does not have an open waiting list that he is eligible for, but [place] does. Patient was interested in applying for [place] and requested information by email. CSW emailed patient application information.Patient also in need of transportation assistance to appointments. Will plan to enroll patient in [facility] transportation services and assist in scheduling ride to next PCP follow up appointment.   Review of patient status, including review of consultants reports, relevant laboratory and other test results, and collaboration with appropriate care team members and the patient's provider was performed as part of comprehensive patient evaluation and provision of services.    SDOH (Social Determinants of Health) assessments performed: No  Goals Addressed   None      Follow up Plan: 1. CSW  emailed Scott Norton application to patient, per patient request.  2. CSW to assist with transportation to patient's next appointment with PCP.  Abigail Butts, LCSW [facility] [phone]

## 2020-12-30 NOTE — Patient Instructions (Signed)

## 2020-12-30 NOTE — Progress Notes (Signed)
[facility] Patient [facility] [address] [place], [place]  [address] Phone:  [phone]   Fax:  [phone]   New Patient Office Visit  Subjective:  Patient ID: Scott Norton, male    DOB: [birth year]  Age: 55 y.o. MRN: [id number]  CC:  Chief Complaint  Patient presents with  . Establish Care  . Sickle Cell Anemia    HPI Scott Norton presents to establish care. He  has a past medical history of Anemia, Depression, Heart murmur, Pneumonia, Seasonal allergies, and Sickle cell anemia (HCC).   He is having 6/10 back pain. He has shoulder and back pain. He denies any back injury. His lower back xray indicated arthritis.  He was given prednisone in the ER and he did not like this so he discontinued it. He researched the use of steroids for pain and states" it is not for patients with SCD". "It can make you have a crisis".    He has been followed by hematology. He admits that he does fine with his Oxycodone and his vitamin D. He is not interested in hydroxyurea. He does not use folic acid. This causes him to itch.   He has transportation issues so he is unable to get to [facility]. He is also having housing problems. He admits that his disability is not enough to cover the hotel cost.   Past Medical History:  Diagnosis Date  . Anemia   . Depression   . Heart murmur   . Pneumonia    ?ACS requring intubation  . Seasonal allergies    wool & grass  . Sickle cell anemia (HCC)     Past Surgical History:  Procedure Laterality Date  . ROOT CANAL      Family History  Problem Relation Age of Onset  . Alcohol abuse Mother   . Sickle cell trait Mother   . Hypertension Mother   . Alcohol abuse Father   . Sickle cell trait Father   . Early death Father        due to alcoholism  . Hypertension Father   . Hypertension Sister   . Hypertension Brother   . Hypertension Maternal Grandmother   . Hypertension Maternal Grandfather   . Hypertension Paternal Grandmother   . Hypertension  Paternal Grandfather     Social History   Socioeconomic History  . Marital status: Single    Spouse name: Not on file  . Number of children: Not on file  . Years of education: Not on file  . Highest education level: Not on file  Occupational History  . Not on file  Tobacco Use  . Smoking status: Current Every Day Smoker    Packs/day: 0.50    Types: Cigars  . Smokeless tobacco: Never Used  Vaping Use  . Vaping Use: Never used  Substance and Sexual Activity  . Alcohol use: Yes    Comment: occasional but indicated etoh abuse on health hx questionaire  . Drug use: Yes    Types: Marijuana  . Sexual activity: Yes  Other Topics Concern  . Not on file  Social History Narrative   Lives with Friends in [place]. Working on housing for himself   On disability for his SS disease      No children, no stable relationship   Social Determinants of Corporate investment banker Strain: Not on file  Food Insecurity: Not on file  Transportation Needs: Not on file  Physical Activity: Not on file  Stress:  Not on file  Social Connections: Not on file  Intimate Partner Violence: Not on file    ROS Review of Systems  Constitutional: Negative.   HENT: Negative.   Eyes:       He has an apt   Respiratory: Negative for shortness of breath.   Cardiovascular: Negative for chest pain.  Gastrointestinal: Negative for constipation, nausea and vomiting.  Endocrine: Negative.   Genitourinary: Negative for dysuria, frequency and urgency.  Musculoskeletal: Positive for back pain.  Skin: Negative.   Allergic/Immunologic: Negative.   Neurological: Negative for dizziness and headaches.  Hematological: Negative.   Psychiatric/Behavioral: Negative.     Objective:   Today's Vitals: BP (!) 118/58   Pulse 70   Ht 6\' 3"  (1.905 m)   Wt 158 lb (71.7 kg)   SpO2 97%   BMI 19.75 kg/m   Physical Exam HENT:     Head: Normocephalic and atraumatic.     Right Ear: Tympanic membrane normal.     Left  Ear: Tympanic membrane normal.     Nose: Nose normal.     Mouth/Throat:     Mouth: Mucous membranes are moist.     Comments: Dark discoloration to oral mucousa Cardiovascular:     Rate and Rhythm: Normal rate and regular rhythm.     Pulses: Normal pulses.     Heart sounds: Normal heart sounds.  Pulmonary:     Effort: Pulmonary effort is normal.     Breath sounds: Normal breath sounds.  Abdominal:     General: Abdomen is flat.     Palpations: Abdomen is soft.  Musculoskeletal:        General: Normal range of motion.     Cervical back: Normal range of motion.     Right lower leg: No edema.     Left lower leg: No edema.  Skin:    General: Skin is warm and dry.     Capillary Refill: Capillary refill takes less than 2 seconds.  Neurological:     General: No focal deficit present.     Mental Status: He is alert and oriented to person, place, and time.  Psychiatric:        Mood and Affect: Mood normal.        Behavior: Behavior normal.        Thought Content: Thought content normal.     Assessment & Plan:   Problem List Items Addressed This Visit      Other   Chronic pain Will provide with one refill of Oxycodone last rx 12/2020 PMP consistant   Relevant Medications   oxyCODONE-acetaminophen (PERCOCET) 10-325 MG tablet   Other Relevant Orders   01/2021 11+Oxyco+Alc+Crt-Bund   Sickle cell anemia (HCC) Ensure adequate hydration. Move frequently to reduce venous thromboembolism risk. Avoid situations that could lead to dehydration or could exacerbate pain Discussed S&S of infection, seizures, stroke acute chest, DVT and how important it is to seek medical attention Take medication as directed along with pain contract and overall compliance Discussed the risk related to opiate use (addition, tolerance and dependency)     Relevant Orders   Sickle Cell Panel   [id number] 11+Oxyco+Alc+Crt-Bund    Other Visit Diagnoses    Encounter to establish care    -  Primary Discussed male  health maintenance; self exams of breast and testicles  and annual exams. Discussed prostate age related concerns Discussed general safety in vehicle and COVID Discussed regular hydration with water Discussed healthy diet and exercise and  weight management Discussed mental health Encouraged to call our office for an appointment with in ongoing concerns for questions.      Anxiety     Declines any treatment Polysubstance abuse Will continue to monitor closely       Outpatient Encounter Medications as of 12/30/2020  Medication Sig  . ergocalciferol (VITAMIN D2) 1.25 MG (50000 UT) capsule Take 50,000 Units by mouth every 30 (thirty) days.   . [DISCONTINUED] oxyCODONE-acetaminophen (PERCOCET) 10-325 MG tablet Take 1 tablet by mouth every 4 (four) hours as needed for pain.  Marland Kitchen oxyCODONE-acetaminophen (PERCOCET) 10-325 MG tablet Take 1 tablet by mouth every 4 (four) hours as needed for up to 15 days for pain.  . [DISCONTINUED] predniSONE (STERAPRED UNI-PAK 21 TAB) 10 MG (21) TBPK tablet Take 6 tabs for 2 days, then 5 for 2 days, then 4 for 2 days, then 3 for 2 days, 2 for 2 days, then 1 for 2 days   No facility-administered encounter medications on file as of 12/30/2020.    Follow-up: Return in about 2 months (around 03/01/2021).   Barbette Merino, NP

## 2020-12-31 LAB — CMP14+CBC/D/PLT+FER+RETIC+V...
ALT: 14 IU/L (ref 0–44)
AST: 21 IU/L (ref 0–40)
Albumin/Globulin Ratio: 1.6 (ref 1.2–2.2)
Albumin: 4.2 g/dL (ref 3.8–4.9)
Alkaline Phosphatase: 204 IU/L — ABNORMAL HIGH (ref 44–121)
BUN/Creatinine Ratio: 15 (ref 9–20)
BUN: 22 mg/dL (ref 6–24)
Basophils Absolute: 0 10*3/uL (ref 0.0–0.2)
Basos: 0 %
Bilirubin Total: 2 mg/dL — ABNORMAL HIGH (ref 0.0–1.2)
CO2: 21 mmol/L (ref 20–29)
Calcium: 9.2 mg/dL (ref 8.7–10.2)
Chloride: 104 mmol/L (ref 96–106)
Creatinine, Ser: 1.48 mg/dL — ABNORMAL HIGH (ref 0.76–1.27)
EOS (ABSOLUTE): 0 10*3/uL (ref 0.0–0.4)
Eos: 0 %
Ferritin: 148 ng/mL (ref 30–400)
Globulin, Total: 2.6 g/dL (ref 1.5–4.5)
Glucose: 80 mg/dL (ref 65–99)
Hematocrit: 22.4 % — ABNORMAL LOW (ref 37.5–51.0)
Hemoglobin: 7.5 g/dL — ABNORMAL LOW (ref 13.0–17.7)
Immature Grans (Abs): 0 10*3/uL (ref 0.0–0.1)
Immature Granulocytes: 0 %
Lymphocytes Absolute: 2.8 10*3/uL (ref 0.7–3.1)
Lymphs: 24 %
MCH: 31.4 pg (ref 26.6–33.0)
MCHC: 33.5 g/dL (ref 31.5–35.7)
MCV: 94 fL (ref 79–97)
Monocytes Absolute: 1.9 10*3/uL — ABNORMAL HIGH (ref 0.1–0.9)
Monocytes: 17 %
NRBC: 14 % — ABNORMAL HIGH (ref 0–0)
Neutrophils Absolute: 6.8 10*3/uL (ref 1.4–7.0)
Neutrophils: 59 %
Platelets: 228 10*3/uL (ref 150–450)
Potassium: 4.7 mmol/L (ref 3.5–5.2)
RBC: 2.39 x10E6/uL — CL (ref 4.14–5.80)
RDW: 22.2 % — ABNORMAL HIGH (ref 11.6–15.4)
Retic Ct Pct: 9 % — ABNORMAL HIGH (ref 0.6–2.6)
Sodium: 141 mmol/L (ref 134–144)
Total Protein: 6.8 g/dL (ref 6.0–8.5)
Vit D, 25-Hydroxy: 18.7 ng/mL — ABNORMAL LOW (ref 30.0–100.0)
WBC: 11.5 10*3/uL — ABNORMAL HIGH (ref 3.4–10.8)
eGFR: 56 mL/min/{1.73_m2} — ABNORMAL LOW (ref >59)

## 2021-01-01 MED FILL — OXYCODONE-APAP 10-325: 10-325 | 10 days supply | Qty: 60 | Fill #0

## 2021-01-07 ENCOUNTER — Other Ambulatory Visit: Payer: Self-pay | Admitting: Nurse Practitioner

## 2021-01-08 ENCOUNTER — Other Ambulatory Visit: Payer: Self-pay

## 2021-01-09 ENCOUNTER — Other Ambulatory Visit: Payer: Self-pay

## 2021-01-09 ENCOUNTER — Other Ambulatory Visit (HOSPITAL_COMMUNITY): Payer: Self-pay | Admitting: Nurse Practitioner

## 2021-01-09 MED ORDER — OXYCODONE-ACETAMINOPHEN 10-325 MG PO TABS: 1.0000 | ORAL_TABLET | ORAL | 0 refills | Status: DC | PRN

## 2021-01-09 NOTE — Telephone Encounter (Signed)
Forwarding

## 2021-01-16 MED FILL — OXYCODONE-APAP 10-325: 10-325 | 10 days supply | Qty: 60 | Fill #0

## 2021-01-26 ENCOUNTER — Other Ambulatory Visit: Payer: Self-pay

## 2021-01-26 ENCOUNTER — Inpatient Hospital Stay: Payer: Medicare Other | Admitting: Physician Assistant

## 2021-01-27 ENCOUNTER — Other Ambulatory Visit (HOSPITAL_COMMUNITY): Payer: Self-pay

## 2021-01-27 MED ORDER — OXYCODONE-ACETAMINOPHEN 10-325 MG PO TABS
1.0000 | ORAL_TABLET | ORAL | 0 refills | Status: DC | PRN
  Filled 2021-01-31: qty 60, 10d supply, fill #0

## 2021-01-31 ENCOUNTER — Other Ambulatory Visit (HOSPITAL_COMMUNITY): Payer: Self-pay

## 2021-02-07 ENCOUNTER — Emergency Department (HOSPITAL_COMMUNITY): Payer: Medicare Other

## 2021-02-07 ENCOUNTER — Other Ambulatory Visit: Payer: Self-pay

## 2021-02-07 ENCOUNTER — Emergency Department (HOSPITAL_COMMUNITY)
Admission: EM | Admit: 2021-02-07 | Discharge: 2021-02-07 | Disposition: A | Discharge status: Home / Self Care | Payer: Medicare Other | Source: Home / Self Care | Attending: Emergency Medicine | Admitting: Emergency Medicine

## 2021-02-07 ENCOUNTER — Encounter (HOSPITAL_COMMUNITY): Payer: Self-pay | Admitting: Emergency Medicine

## 2021-02-07 DIAGNOSIS — D5700 Hb-SS disease with crisis, unspecified: Secondary | ICD-10-CM | POA: Diagnosis not present

## 2021-02-07 DIAGNOSIS — M791 Myalgia, unspecified site: Secondary | ICD-10-CM | POA: Insufficient documentation

## 2021-02-07 DIAGNOSIS — F17290 Nicotine dependence, other tobacco product, uncomplicated: Secondary | ICD-10-CM | POA: Insufficient documentation

## 2021-02-07 DIAGNOSIS — W57XXXA Bitten or stung by nonvenomous insect and other nonvenomous arthropods, initial encounter: Secondary | ICD-10-CM | POA: Insufficient documentation

## 2021-02-07 DIAGNOSIS — Y9259 Other trade areas as the place of occurrence of the external cause: Secondary | ICD-10-CM | POA: Insufficient documentation

## 2021-02-07 DIAGNOSIS — Z5900 Homelessness unspecified: Secondary | ICD-10-CM | POA: Insufficient documentation

## 2021-02-07 DIAGNOSIS — S90562A Insect bite (nonvenomous), left ankle, initial encounter: Secondary | ICD-10-CM | POA: Insufficient documentation

## 2021-02-07 LAB — COMPREHENSIVE METABOLIC PANEL
ALT: 15 U/L (ref 0–44)
AST: 40 U/L (ref 15–41)
Albumin: 3.9 g/dL (ref 3.5–5.0)
Alkaline Phosphatase: 203 U/L — ABNORMAL HIGH (ref 38–126)
Anion gap: 7 (ref 5–15)
BUN: 18 mg/dL (ref 6–20)
CO2: 23 mmol/L (ref 22–32)
Calcium: 9.1 mg/dL (ref 8.9–10.3)
Chloride: 109 mmol/L (ref 98–111)
Creatinine, Ser: 1.61 mg/dL — ABNORMAL HIGH (ref 0.61–1.24)
GFR, Estimated: 51 mL/min — ABNORMAL LOW (ref >60)
Glucose, Bld: 102 mg/dL — ABNORMAL HIGH (ref 70–99)
Potassium: 4.6 mmol/L (ref 3.5–5.1)
Sodium: 139 mmol/L (ref 135–145)
Total Bilirubin: 2.1 mg/dL — ABNORMAL HIGH (ref 0.3–1.2)
Total Protein: 7 g/dL (ref 6.5–8.1)

## 2021-02-07 LAB — CBC WITH DIFFERENTIAL/PLATELET
Abs Immature Granulocytes: 0.05 10*3/uL (ref 0.00–0.07)
Basophils Absolute: 0.1 10*3/uL (ref 0.0–0.1)
Basophils Relative: 1 %
Eosinophils Absolute: 0.3 10*3/uL (ref 0.0–0.5)
Eosinophils Relative: 3 %
HCT: 20.2 % — ABNORMAL LOW (ref 39.0–52.0)
Hemoglobin: 7 g/dL — ABNORMAL LOW (ref 13.0–17.0)
Immature Granulocytes: 1 %
Lymphocytes Relative: 35 %
Lymphs Abs: 3.2 10*3/uL (ref 0.7–4.0)
MCH: 31.3 pg (ref 26.0–34.0)
MCHC: 34.7 g/dL (ref 30.0–36.0)
MCV: 90.2 fL (ref 80.0–100.0)
Monocytes Absolute: 1.6 10*3/uL — ABNORMAL HIGH (ref 0.1–1.0)
Monocytes Relative: 17 %
Neutro Abs: 4.2 10*3/uL (ref 1.7–7.7)
Neutrophils Relative %: 43 %
Platelets: 175 10*3/uL (ref 150–400)
RBC: 2.24 MIL/uL — ABNORMAL LOW (ref 4.22–5.81)
RDW: 23.9 % — ABNORMAL HIGH (ref 11.5–15.5)
WBC: 9.4 10*3/uL (ref 4.0–10.5)
nRBC: 4.7 % — ABNORMAL HIGH (ref 0.0–0.2)

## 2021-02-07 LAB — RETICULOCYTES
Immature Retic Fract: 48.5 % — ABNORMAL HIGH (ref 2.3–15.9)
RBC.: 2.21 MIL/uL — ABNORMAL LOW (ref 4.22–5.81)
Retic Count, Absolute: 207.5 10*3/uL — ABNORMAL HIGH (ref 19.0–186.0)
Retic Ct Pct: 9.4 % — ABNORMAL HIGH (ref 0.4–3.1)

## 2021-02-07 LAB — ETHANOL: Alcohol, Ethyl (B): <10 mg/dL (ref <10)

## 2021-02-07 MED ORDER — KETOROLAC TROMETHAMINE 15 MG/ML IJ SOLN
15.0000 mg | INTRAMUSCULAR | Status: AC
  Administered 2021-02-07: 15 mg via INTRAVENOUS
  Filled 2021-02-07: qty 1

## 2021-02-07 MED ORDER — HYDROMORPHONE HCL 2 MG/ML IJ SOLN
2.0000 mg | INTRAMUSCULAR | Status: AC
Start: 2021-02-07 — End: 2021-02-07
  Administered 2021-02-07: 2 mg via INTRAVENOUS
  Filled 2021-02-07: qty 1

## 2021-02-07 MED ORDER — DIPHENHYDRAMINE HCL 25 MG PO CAPS
25.0000 mg | ORAL_CAPSULE | Freq: Once | ORAL | Status: AC
  Administered 2021-02-07: 25 mg via ORAL
  Filled 2021-02-07: qty 1

## 2021-02-07 MED ORDER — HYDROMORPHONE HCL 2 MG/ML IJ SOLN
2.0000 mg | INTRAMUSCULAR | Status: AC
  Administered 2021-02-07: 2 mg via INTRAVENOUS
  Filled 2021-02-07: qty 1

## 2021-02-07 MED ORDER — DIPHENHYDRAMINE HCL 50 MG/ML IJ SOLN
6.2500 mg | Freq: Once | INTRAMUSCULAR | Status: AC
  Administered 2021-02-07: 6.5 mg via INTRAVENOUS
  Filled 2021-02-07: qty 1

## 2021-02-07 MED ORDER — SODIUM CHLORIDE 0.9 % IV BOLUS
1000.0000 mL | Freq: Once | INTRAVENOUS | Status: AC
  Administered 2021-02-07: 1000 mL via INTRAVENOUS

## 2021-02-07 MED ORDER — ONDANSETRON 8 MG PO TBDP
8.0000 mg | ORAL_TABLET | Freq: Once | ORAL | Status: AC
  Administered 2021-02-07: 8 mg via ORAL
  Filled 2021-02-07: qty 1

## 2021-02-07 MED ORDER — DIPHENHYDRAMINE HCL 50 MG/ML IJ SOLN: 25.0000 mg | Freq: Once | INTRAMUSCULAR | Status: DC

## 2021-02-07 NOTE — ED Triage Notes (Signed)
Emergency Medicine Provider Triage Evaluation Note  Scott Norton , a 55 y.o. male  was evaluated in triage.  Pt complains of hurting all over.  Patient states that he has a history of sickle cell and has worsening pain everywhere.  He also is been living in the hotel and noticed a bug bite in the left ankle area  Review of Systems  Positive: Diffuse pain Negative: Fever  Physical Exam  BP 129/68 (BP Location: Right Arm)   Pulse 79   Temp 97.9 F (36.6 C) (Oral)   Resp 18   Ht 6\' 3"  (1.905 m)   SpO2 97%   BMI 19.75 kg/m  Gen:   Awake, no distress   HEENT:  Atraumatic  Resp:  Normal effort  Cardiac:  Normal rate  Abd:   Nondistended, nontender MSK:   Moves extremities without difficulty.  Small bug bite in the left Ankle area.  No obvious cellulitis  Neuro:  Speech clear  Medical Decision Making  Medically screening exam initiated at 6:02 PM.  Appropriate orders placed.  Scott Norton was informed that the remainder of the evaluation will be completed by another provider, this initial triage assessment does not replace that evaluation, and the importance of remaining in the ED until their evaluation is complete.  Clinical Impression  Patient is here with pain all over.  Patient has history of sickle cell and thinks that he is in sickle cell exacerbation.  He does have a small bug bite in the left ankle area but no obvious signs of cellulitis.  I ordered CBC and CMP and reticulocyte count.  Patient ran out of his pain meds    [name], MD 02/07/21 [id number]

## 2021-02-07 NOTE — ED Notes (Signed)
No cab or bus vouchers available, checked with charge nurse and Licensed conveyancer.  Pt made aware.

## 2021-02-07 NOTE — ED Triage Notes (Signed)
Patient here from home reporting sickle cell pain crisis "all over". Living in hotel reports bug bite to left ankle.

## 2021-02-07 NOTE — Progress Notes (Signed)
.   Transition of Care [facility]) - Emergency Department Mini Assessment   Patient Details  Name: Scott Norton MRN: [id number] Date of Birth: 1965/11/09  Transition of Care [facility]) CM/SW Contact:    Elliot Cousin, RN Phone Number: [phone] 02/07/2021, 8:14 PM   Clinical Narrative:  TOC CM spoke to pt and provided him with the shelter list to follow up and complete assessment for male shelter beds. Will create referral to [facility] for housing and homeless issues. Pt gave verbal permission. ED provider updated.   ED Mini Assessment: What brought you to the Emergency Department? : pain  Barriers to Discharge: ED Shelter capacity  Barrier interventions: provided list of shelters  Means of departure: Public Transportation  Interventions which prevented an admission or readmission: Homeless Screening    Patient Contact and Communications        ,                 Admission diagnosis:  Sickle Cell Pain Crisis; Bug Bite Patient Active Problem List   Diagnosis Date Noted  . Homelessness 09/14/2020  . Verbalizes suicidal thoughts 09/04/2020  . Tobacco use 09/04/2020  . Sickle cell pain crisis (HCC) 04/11/2015  . Vitamin D deficiency 01/31/2015  . Chronic pain 06/30/2014  . Respiratory infection 08/13/2012  . Healthcare maintenance 06/04/2012  . Cervical strain, acute 12/09/2011  . Neck sprain and strain 12/09/2011  . Sickle cell anemia (HCC) 11/29/2011  . Seasonal allergies 11/29/2011  . Seasonal allergic rhinitis 11/29/2011   PCP:  Barbette Merino, NP Pharmacy:   [facility] - [place],  - [address] [place] [address] Phone: [phone] Fax: [phone]  [facility] [address] [place] [address] Phone: [phone] Fax: [phone]

## 2021-02-07 NOTE — ED Provider Notes (Signed)
[facility]-EMERGENCY DEPT Provider Note   CSN: [id number] Arrival date & time: 02/07/21  1659     History Chief Complaint  Patient presents with  . Sickle Cell Pain Crisis  . Insect Bite    Scott Norton is a 55 y.o. male history of sickle cell anemia, depression, here presenting with multiple issues.  Patient states that he is hurting all over.  He initially told me he has sickle cell crisis.  Upon further questioning, patient told me that he was somehow kicked out of his motel room.  Apparently he is staying with someone and he was told to leave.  He states that he was trying to help somebody.  Patient states that he noticed bug bite to the left ankle area he wants to make sure is not a problem recluse spider.  Patient told me that he has no more pain medicine but per the database, patient just filled his oxycodone about a week ago.  At the end of the visit, patient request to talk to a Child psychotherapist.  The history is provided by the patient.       Past Medical History:  Diagnosis Date  . Anemia   . Depression   . Heart murmur   . Pneumonia    ?ACS requring intubation  . Seasonal allergies    wool & grass  . Sickle cell anemia [facility])     Patient Active Problem List   Diagnosis Date Noted  . Homelessness 09/14/2020  . Verbalizes suicidal thoughts 09/04/2020  . Tobacco use 09/04/2020  . Sickle cell pain crisis (HCC) 04/11/2015  . Vitamin D deficiency 01/31/2015  . Chronic pain 06/30/2014  . Respiratory infection 08/13/2012  . Healthcare maintenance 06/04/2012  . Cervical strain, acute 12/09/2011  . Neck sprain and strain 12/09/2011  . Sickle cell anemia (HCC) 11/29/2011  . Seasonal allergies 11/29/2011  . Seasonal allergic rhinitis 11/29/2011    Past Surgical History:  Procedure Laterality Date  . ROOT CANAL         Family History  Problem Relation Age of Onset  . Alcohol abuse Mother   . Sickle cell trait Mother   . Hypertension Mother    . Alcohol abuse Father   . Sickle cell trait Father   . Early death Father        due to alcoholism  . Hypertension Father   . Hypertension Sister   . Hypertension Brother   . Hypertension Maternal Grandmother   . Hypertension Maternal Grandfather   . Hypertension Paternal Grandmother   . Hypertension Paternal Grandfather     Social History   Tobacco Use  . Smoking status: Current Every Day Smoker    Packs/day: 0.50    Types: Cigars  . Smokeless tobacco: Never Used  Vaping Use  . Vaping Use: Never used  Substance Use Topics  . Alcohol use: Yes    Comment: occasional but indicated etoh abuse on health hx questionaire  . Drug use: Yes    Types: Marijuana    Home Medications Prior to Admission medications   Medication Sig Start Date End Date Taking? Authorizing Provider  ergocalciferol (VITAMIN D2) 1.25 MG (50000 UT) capsule Take 50,000 Units by mouth every 30 (thirty) days.  07/07/20 07/07/21  [provider]  oxyCODONE-acetaminophen (PERCOCET) 10-325 MG tablet Take 1 tablet by mouth every 4 (four) hours as needed for up to 15 days for pain. 01/31/21 02/15/21  Barbette Merino, NP    Allergies  Aspirin and Tramadol  Review of Systems   Review of Systems  Musculoskeletal: Positive for back pain.  All other systems reviewed and are negative.   Physical Exam Updated Vital Signs BP (!) 107/59   Pulse 60   Temp 97.9 F (36.6 C) (Oral)   Resp 15   Ht 6\' 3"  (1.905 m)   SpO2 100%   BMI 19.75 kg/m   Physical Exam Vitals and nursing note reviewed.  Constitutional:      Appearance: Normal appearance.  HENT:     Head: Normocephalic.     Nose: Nose normal.     Mouth/Throat:     Mouth: Mucous membranes are moist.  Eyes:     Extraocular Movements: Extraocular movements intact.     Pupils: Pupils are equal, round, and reactive to light.  Cardiovascular:     Rate and Rhythm: Normal rate and regular rhythm.     Pulses: Normal pulses.     Heart sounds:  Normal heart sounds.  Pulmonary:     Effort: Pulmonary effort is normal.     Breath sounds: Normal breath sounds.  Abdominal:     General: Abdomen is flat.     Palpations: Abdomen is soft.  Musculoskeletal:        General: Normal range of motion.     Cervical back: Normal range of motion and neck supple.     Comments: No spinal tenderness.  No obvious signs of extremity trauma  Skin:    General: Skin is warm.     Capillary Refill: Capillary refill takes less than 2 seconds.     Comments: Left ankle area with small bite mark but no surrounding cellulitis.  Neurovascular intact in bilateral lower extremities  Neurological:     General: No focal deficit present.     Mental Status: He is alert and oriented to person, place, and time.  Psychiatric:     Comments: Slightly anxious     ED Results / Procedures / Treatments   Labs (all labs ordered are listed, but only abnormal results are displayed) Labs Reviewed  CBC WITH DIFFERENTIAL/PLATELET - Abnormal; Notable for the following components:      Result Value   RBC 2.24 (*)    Hemoglobin 7.0 (*)    HCT 20.2 (*)    RDW 23.9 (*)    nRBC 4.7 (*)    Monocytes Absolute 1.6 (*)    All other components within normal limits  COMPREHENSIVE METABOLIC PANEL - Abnormal; Notable for the following components:   Glucose, Bld 102 (*)    Creatinine, Ser 1.61 (*)    Alkaline Phosphatase 203 (*)    Total Bilirubin 2.1 (*)    GFR, Estimated 51 (*)    All other components within normal limits  RETICULOCYTES - Abnormal; Notable for the following components:   Retic Ct Pct 9.4 (*)    RBC. 2.21 (*)    Retic Count, Absolute 207.5 (*)    Immature Retic Fract 48.5 (*)    All other components within normal limits  ETHANOL    EKG None  Radiology DG Chest Port 1 View  Result Date: 02/07/2021 CLINICAL DATA:  Sickle cell crisis and cough, initial encounter EXAM: PORTABLE CHEST 1 VIEW COMPARISON:  09/11/2020 FINDINGS: Cardiac shadow is enlarged but  stable. Aortic calcifications are noted. Vascular congestion and interstitial edema is again identified and stable. Pleural effusions have improved in the interval. No focal confluent infiltrate is seen. No bony abnormality is noted. IMPRESSION: Changes  consistent with CHF. Electronically Signed   By: Alcide Clever M.D.   On: 02/07/2021 22:11    Procedures Procedures   Medications Ordered in ED Medications  ketorolac (TORADOL) 15 MG/ML injection 15 mg (15 mg Intravenous Given 02/07/21 1852)  HYDROmorphone (DILAUDID) injection 2 mg (2 mg Intravenous Given 02/07/21 1852)  HYDROmorphone (DILAUDID) injection 2 mg (2 mg Intravenous Given 02/07/21 1953)  sodium chloride 0.9 % bolus 1,000 mL (0 mLs Intravenous Stopped 02/07/21 1952)  diphenhydrAMINE (BENADRYL) injection 6.5 mg (6.5 mg Intravenous Given 02/07/21 2128)    ED Course  I have reviewed the triage vital signs and the nursing notes.  Pertinent labs & imaging results that were available during my care of the patient were reviewed by me and considered in my medical decision making (see chart for details).    MDM Rules/Calculators/A&P                         Nai Dasch is a 55 y.o. male you presenting with pain all over possible insect bite to the left ankle as well as homelessness.  Patient has history of sickle cell and was prescribed Percocet and filled it about a week ago and states that he ran out.  He has pain all over and he has a small insect bite to the left ankle with no obvious cellulitis.  I think most of his problem may be social in nature as he is homeless.  We will get CBC and CMP reticulocyte count.  We will give pain medicine per sickle cell protocol.  Will discuss with social work regarding shelters  10:27 PM Patient's hemoglobin is 7 which is similar to baseline.  Patient's creatinine is 1.6 and last time was 1.4.  Patient was given IV fluids.  Patient was also given pain medicine and he briefly desatted to about 89 to 90%  when he falls asleep.  Now he is fully awake and oxygen saturations 100% on room air.  Chest x-ray did not show any obvious acute chest.  Patient is itchy from the Dilaudid.  At this point, patient is stable for discharge.   Final Clinical Impression(s) / ED Diagnoses Final diagnoses:  None    Rx / DC Orders ED Discharge Orders    None       Charlynne Pander, MD 02/07/21 2228

## 2021-02-07 NOTE — ED Notes (Signed)
Security called by charge nurse, pt discharged, but refusing to leave ER room.  Charge nurse ordered odt zofran for pt throwing up, pt making himself throw up.

## 2021-02-07 NOTE — Discharge Instructions (Signed)
You need to talk to your doctor about your pain medicine  Follow-up with your sickle cell clinic  Return to ER if you have worse back pain, leg pain, redness of the ankle area, fever, trouble breathing

## 2021-02-07 NOTE — ED Notes (Signed)
Pt's 02 sats dropped to 85% on room air after dilaudid administration.  2L 02 applied, sats now 92%.

## 2021-02-08 ENCOUNTER — Other Ambulatory Visit: Payer: Self-pay

## 2021-02-08 ENCOUNTER — Emergency Department (HOSPITAL_COMMUNITY): Payer: Medicare Other

## 2021-02-08 ENCOUNTER — Inpatient Hospital Stay (HOSPITAL_COMMUNITY)
Admission: EM | Admit: 2021-02-08 | Discharge: 2021-02-14 | DRG: 812 | Disposition: A | Discharge status: Home / Self Care | Payer: Medicare Other | Attending: Internal Medicine | Admitting: Internal Medicine

## 2021-02-08 ENCOUNTER — Encounter (HOSPITAL_COMMUNITY): Payer: Self-pay

## 2021-02-08 DIAGNOSIS — R0902 Hypoxemia: Secondary | ICD-10-CM | POA: Diagnosis present

## 2021-02-08 DIAGNOSIS — F1120 Opioid dependence, uncomplicated: Secondary | ICD-10-CM | POA: Diagnosis present

## 2021-02-08 DIAGNOSIS — G894 Chronic pain syndrome: Secondary | ICD-10-CM | POA: Diagnosis present

## 2021-02-08 DIAGNOSIS — D72829 Elevated white blood cell count, unspecified: Secondary | ICD-10-CM | POA: Diagnosis present

## 2021-02-08 DIAGNOSIS — N179 Acute kidney failure, unspecified: Secondary | ICD-10-CM | POA: Diagnosis present

## 2021-02-08 DIAGNOSIS — N183 Chronic kidney disease, stage 3 unspecified: Secondary | ICD-10-CM | POA: Diagnosis present

## 2021-02-08 DIAGNOSIS — D571 Sickle-cell disease without crisis: Secondary | ICD-10-CM | POA: Diagnosis present

## 2021-02-08 DIAGNOSIS — F17290 Nicotine dependence, other tobacco product, uncomplicated: Secondary | ICD-10-CM | POA: Diagnosis present

## 2021-02-08 DIAGNOSIS — Z8249 Family history of ischemic heart disease and other diseases of the circulatory system: Secondary | ICD-10-CM

## 2021-02-08 DIAGNOSIS — Z832 Family history of diseases of the blood and blood-forming organs and certain disorders involving the immune mechanism: Secondary | ICD-10-CM

## 2021-02-08 DIAGNOSIS — F1410 Cocaine abuse, uncomplicated: Secondary | ICD-10-CM

## 2021-02-08 DIAGNOSIS — Z5900 Homelessness unspecified: Secondary | ICD-10-CM

## 2021-02-08 DIAGNOSIS — D696 Thrombocytopenia, unspecified: Secondary | ICD-10-CM | POA: Diagnosis present

## 2021-02-08 DIAGNOSIS — Z20822 Contact with and (suspected) exposure to covid-19: Secondary | ICD-10-CM | POA: Diagnosis present

## 2021-02-08 DIAGNOSIS — D5700 Hb-SS disease with crisis, unspecified: Secondary | ICD-10-CM | POA: Diagnosis present

## 2021-02-08 DIAGNOSIS — E875 Hyperkalemia: Secondary | ICD-10-CM | POA: Diagnosis present

## 2021-02-08 DIAGNOSIS — D638 Anemia in other chronic diseases classified elsewhere: Secondary | ICD-10-CM | POA: Diagnosis present

## 2021-02-08 DIAGNOSIS — Z720 Tobacco use: Secondary | ICD-10-CM | POA: Diagnosis present

## 2021-02-08 HISTORY — DX: Acute kidney failure, unspecified: N17.9

## 2021-02-08 HISTORY — DX: Cocaine abuse, uncomplicated: F14.10

## 2021-02-08 LAB — CBC WITH DIFFERENTIAL/PLATELET
Abs Immature Granulocytes: 0.08 10*3/uL — ABNORMAL HIGH (ref 0.00–0.07)
Basophils Absolute: 0 10*3/uL (ref 0.0–0.1)
Basophils Relative: 0 %
Eosinophils Absolute: 0 10*3/uL (ref 0.0–0.5)
Eosinophils Relative: 0 %
HCT: 17 % — ABNORMAL LOW (ref 39.0–52.0)
Hemoglobin: 6.1 g/dL — CL (ref 13.0–17.0)
Immature Granulocytes: 1 %
Lymphocytes Relative: 8 %
Lymphs Abs: 1 10*3/uL (ref 0.7–4.0)
MCH: 31.3 pg (ref 26.0–34.0)
MCHC: 35.9 g/dL (ref 30.0–36.0)
MCV: 87.2 fL (ref 80.0–100.0)
Monocytes Absolute: 1.7 10*3/uL — ABNORMAL HIGH (ref 0.1–1.0)
Monocytes Relative: 13 %
Neutro Abs: 9.9 10*3/uL — ABNORMAL HIGH (ref 1.7–7.7)
Neutrophils Relative %: 78 %
Platelets: 140 10*3/uL — ABNORMAL LOW (ref 150–400)
RBC: 1.95 MIL/uL — ABNORMAL LOW (ref 4.22–5.81)
RDW: 23.3 % — ABNORMAL HIGH (ref 11.5–15.5)
WBC: 12.6 10*3/uL — ABNORMAL HIGH (ref 4.0–10.5)
nRBC: 3.4 % — ABNORMAL HIGH (ref 0.0–0.2)

## 2021-02-08 LAB — HEMOGLOBIN AND HEMATOCRIT, BLOOD
HCT: 18 % — ABNORMAL LOW (ref 39.0–52.0)
HCT: 17.8 % — ABNORMAL LOW (ref 39.0–52.0)
Hemoglobin: 6.3 g/dL — CL (ref 13.0–17.0)
Hemoglobin: 6.2 g/dL — CL (ref 13.0–17.0)

## 2021-02-08 LAB — URINALYSIS, ROUTINE W REFLEX MICROSCOPIC
Bilirubin Urine: NEGATIVE
Glucose, UA: NEGATIVE mg/dL
Ketones, ur: NEGATIVE mg/dL
Leukocytes,Ua: NEGATIVE
Nitrite: NEGATIVE
Protein, ur: 30 mg/dL — AB
Specific Gravity, Urine: 1.011 (ref 1.005–1.030)
pH: 5 (ref 5.0–8.0)

## 2021-02-08 LAB — BASIC METABOLIC PANEL
Anion gap: 7 (ref 5–15)
BUN: 26 mg/dL — ABNORMAL HIGH (ref 6–20)
CO2: 23 mmol/L (ref 22–32)
Calcium: 9 mg/dL (ref 8.9–10.3)
Chloride: 111 mmol/L (ref 98–111)
Creatinine, Ser: 1.94 mg/dL — ABNORMAL HIGH (ref 0.61–1.24)
GFR, Estimated: 40 mL/min — ABNORMAL LOW (ref >60)
Glucose, Bld: 91 mg/dL (ref 70–99)
Potassium: 5.7 mmol/L — ABNORMAL HIGH (ref 3.5–5.1)
Sodium: 141 mmol/L (ref 135–145)

## 2021-02-08 LAB — RAPID URINE DRUG SCREEN, HOSP PERFORMED
Amphetamines: NOT DETECTED
Barbiturates: NOT DETECTED
Benzodiazepines: NOT DETECTED
Cocaine: NOT DETECTED
Opiates: POSITIVE — AB
Tetrahydrocannabinol: NOT DETECTED

## 2021-02-08 LAB — RESP PANEL BY RT-PCR (FLU A&B, COVID) ARPGX2
Influenza A by PCR: NEGATIVE
Influenza B by PCR: NEGATIVE
SARS Coronavirus 2 by RT PCR: NEGATIVE

## 2021-02-08 LAB — MAGNESIUM: Magnesium: 2.4 mg/dL (ref 1.7–2.4)

## 2021-02-08 LAB — PREPARE RBC (CROSSMATCH)

## 2021-02-08 MED ORDER — POLYETHYLENE GLYCOL 3350 17 G PO PACK
17.0000 g | PACK | Freq: Every day | ORAL | Status: DC | PRN
  Administered 2021-02-12: 17 g via ORAL
  Filled 2021-02-08: qty 1

## 2021-02-08 MED ORDER — OXYCODONE-ACETAMINOPHEN 5-325 MG PO TABS
1.0000 | ORAL_TABLET | ORAL | Status: DC | PRN
  Administered 2021-02-10 – 2021-02-14 (×3): 1 via ORAL
  Filled 2021-02-08 (×3): qty 1

## 2021-02-08 MED ORDER — SODIUM CHLORIDE 0.9% FLUSH: 9.0000 mL | INTRAVENOUS | Status: DC | PRN

## 2021-02-08 MED ORDER — SODIUM CHLORIDE 0.9 % IV BOLUS
1000.0000 mL | Freq: Once | INTRAVENOUS | Status: AC
  Administered 2021-02-08: 1000 mL via INTRAVENOUS

## 2021-02-08 MED ORDER — NALOXONE HCL 0.4 MG/ML IJ SOLN: 0.4000 mg | INTRAMUSCULAR | Status: DC | PRN

## 2021-02-08 MED ORDER — SENNOSIDES-DOCUSATE SODIUM 8.6-50 MG PO TABS
1.0000 | ORAL_TABLET | Freq: Two times a day (BID) | ORAL | Status: DC
  Administered 2021-02-08 – 2021-02-13 (×12): 1 via ORAL
  Filled 2021-02-08 (×12): qty 1

## 2021-02-08 MED ORDER — HYDROMORPHONE HCL 1 MG/ML IJ SOLN
0.5000 mg | Freq: Once | INTRAMUSCULAR | Status: AC
Start: 2021-02-08 — End: 2021-02-08
  Administered 2021-02-08: 0.5 mg via INTRAVENOUS
  Filled 2021-02-08: qty 1

## 2021-02-08 MED ORDER — ADULT MULTIVITAMIN W/MINERALS CH
1.0000 | ORAL_TABLET | Freq: Every day | ORAL | Status: DC
  Administered 2021-02-08 – 2021-02-13 (×6): 1 via ORAL
  Filled 2021-02-08 (×6): qty 1

## 2021-02-08 MED ORDER — SODIUM CHLORIDE 0.9% IV SOLUTION: Freq: Once | INTRAVENOUS | Status: AC

## 2021-02-08 MED ORDER — ONDANSETRON HCL 4 MG/2ML IJ SOLN: 4.0000 mg | Freq: Four times a day (QID) | INTRAMUSCULAR | Status: DC | PRN

## 2021-02-08 MED ORDER — SODIUM CHLORIDE 0.9 % IV SOLN
10.0000 mL/h | Freq: Once | INTRAVENOUS | Status: AC
  Administered 2021-02-08: 10 mL/h via INTRAVENOUS

## 2021-02-08 MED ORDER — DIPHENHYDRAMINE HCL 50 MG/ML IJ SOLN
12.5000 mg | Freq: Four times a day (QID) | INTRAMUSCULAR | Status: DC | PRN
  Administered 2021-02-09 – 2021-02-12 (×3): 12.5 mg via INTRAVENOUS
  Filled 2021-02-08 (×3): qty 1

## 2021-02-08 MED ORDER — HYDROMORPHONE 1 MG/ML IV SOLN
INTRAVENOUS | Status: DC
  Administered 2021-02-08: 30 mg via INTRAVENOUS
  Administered 2021-02-08: 2 mg via INTRAVENOUS
  Administered 2021-02-08: 1.5 mg via INTRAVENOUS
  Administered 2021-02-09: 0 mg via INTRAVENOUS
  Administered 2021-02-09: 1 mg via INTRAVENOUS
  Administered 2021-02-09: 0 mg via INTRAVENOUS
  Administered 2021-02-09: 4 mg via INTRAVENOUS
  Administered 2021-02-09 – 2021-02-10 (×2): 0 mg via INTRAVENOUS
  Administered 2021-02-10 (×2): 1.5 mg via INTRAVENOUS
  Administered 2021-02-10: 2 mg via INTRAVENOUS
  Administered 2021-02-10: 4 mg via INTRAVENOUS
  Administered 2021-02-10: 2 mg via INTRAVENOUS
  Administered 2021-02-10: 0 mg via INTRAVENOUS
  Administered 2021-02-11: 2 mg via INTRAVENOUS
  Administered 2021-02-11: 1.5 mg via INTRAVENOUS
  Administered 2021-02-11 (×2): 3 mg via INTRAVENOUS
  Administered 2021-02-11: 2 mg via INTRAVENOUS
  Administered 2021-02-12: 1 mg via INTRAVENOUS
  Administered 2021-02-12: 1.5 mg via INTRAVENOUS
  Administered 2021-02-12: 0.5 mg via INTRAVENOUS
  Administered 2021-02-12: 3.5 mg via INTRAVENOUS
  Administered 2021-02-12: 30 mg via INTRAVENOUS
  Administered 2021-02-12: 0 mg via INTRAVENOUS
  Administered 2021-02-13: 4 mg via INTRAVENOUS
  Administered 2021-02-13: 3 mg via INTRAVENOUS
  Administered 2021-02-13 (×2): 0.5 mg via INTRAVENOUS
  Filled 2021-02-08 (×2): qty 30

## 2021-02-08 MED ORDER — ENOXAPARIN SODIUM 40 MG/0.4ML ~~LOC~~ SOLN
40.0000 mg | SUBCUTANEOUS | Status: DC
  Administered 2021-02-08 – 2021-02-13 (×6): 40 mg via SUBCUTANEOUS
  Filled 2021-02-08 (×6): qty 0.4

## 2021-02-08 MED ORDER — FOLIC ACID 1 MG PO TABS
1.0000 mg | ORAL_TABLET | Freq: Every day | ORAL | Status: DC
  Administered 2021-02-08 – 2021-02-13 (×3): 1 mg via ORAL
  Filled 2021-02-08 (×4): qty 1

## 2021-02-08 MED ORDER — NICOTINE 14 MG/24HR TD PT24
14.0000 mg | MEDICATED_PATCH | Freq: Every day | TRANSDERMAL | Status: DC
  Filled 2021-02-08 (×3): qty 1

## 2021-02-08 MED ORDER — KETOROLAC TROMETHAMINE 15 MG/ML IJ SOLN
15.0000 mg | Freq: Once | INTRAMUSCULAR | Status: AC
  Administered 2021-02-08: 15 mg via INTRAVENOUS
  Filled 2021-02-08: qty 1

## 2021-02-08 MED ORDER — ONDANSETRON HCL 4 MG/2ML IJ SOLN: 4.0000 mg | INTRAMUSCULAR | Status: DC | PRN

## 2021-02-08 MED ORDER — OXYCODONE HCL 5 MG PO TABS
5.0000 mg | ORAL_TABLET | ORAL | Status: DC | PRN
  Administered 2021-02-10 – 2021-02-11 (×2): 5 mg via ORAL
  Filled 2021-02-08 (×2): qty 1

## 2021-02-08 MED ORDER — OXYCODONE-ACETAMINOPHEN 10-325 MG PO TABS: 1.0000 | ORAL_TABLET | ORAL | Status: DC | PRN

## 2021-02-08 MED ORDER — HYDROMORPHONE HCL 1 MG/ML IJ SOLN
1.0000 mg | INTRAMUSCULAR | Status: DC | PRN
Start: 2021-02-08 — End: 2021-02-08

## 2021-02-08 MED ORDER — DIPHENHYDRAMINE HCL 12.5 MG/5ML PO ELIX
12.5000 mg | ORAL_SOLUTION | Freq: Four times a day (QID) | ORAL | Status: DC | PRN
  Administered 2021-02-09 – 2021-02-14 (×6): 12.5 mg via ORAL
  Filled 2021-02-08 (×6): qty 5

## 2021-02-08 MED ORDER — SODIUM CHLORIDE 0.9 % IV SOLN
INTRAVENOUS | Status: DC
  Administered 2021-02-10: 100 mL/h via INTRAVENOUS

## 2021-02-08 MED ORDER — ONDANSETRON HCL 4 MG PO TABS
4.0000 mg | ORAL_TABLET | ORAL | Status: DC | PRN
  Filled 2021-02-08: qty 1

## 2021-02-08 NOTE — ED Notes (Signed)
Blood consent signed electronically. ?

## 2021-02-08 NOTE — ED Notes (Signed)
While sleeping Scott Norton O2 sats dropped to 81%. Scott Norton was placed on [facility] with sats increasing to 96%.

## 2021-02-08 NOTE — ED Triage Notes (Signed)
Pt was d/c from [facility]. He was brought via EMS from bus stop. Pt told EMS he had been outside since d/c and cold weather makes his sickle cell flair.

## 2021-02-08 NOTE — H&P (Signed)
History and Physical    Scott Norton LHT:[id number] DOB: [birth year] DOA: 02/08/2021  PCP: Barbette Merino, NP  Patient coming from: bus stop by EMS  I have personally briefly reviewed patient's old medical records in [facility]  Chief Complaint: "iam not feeling well"  HPI: Scott Norton is a 55 y.o. male with medical history significant of sickle cell disease/anemia, cocaine abuse, tobacco abuse who was seen in the ED yesterday for sickle cell crisis and was discharged however he was not willing to leave as he vomited x3 and was having difficulty with breathing  before the discharge.  He was escorted out with security.  he is homeless and lost his placement.  He went to bus stop where he was not feeling well/hurting all over therefore he called 911 and came to the ER again for further evaluation and management.   Reports that he is hurting all over including his shoulders and back.  He is concerned that he probably inhaled his vomiting yesterday.  He denies fever, chills, chest pain, current shortness of breath, leg swelling, orthopnea, PND, vomiting, abdominal pain, lightheadedness, dizziness, decreased appetite, melena, hematuria, or weight loss.  He is struggling to find a place to live at this time.  He smokes black and mild cigars-3 to 5/day, uses cocaine-last use was couple of weeks ago.  Denies alcohol use.  Denies IV drug abuse.  Followed by outpatient sickle cell clinic [facility].  ED Course: Upon arrival to ED: Patient vital signs stable, requiring 2 L of oxygen to maintain oxygen saturation in 90s.  He is afebrile with leukocytosis of 12.6, H&H noted to be 6.1/17.0, platelet: 140, BMP shows potassium of 5.7, AKI, chest x-ray shows improvement in CHF.  He was given Toradol and Dilaudid for pain.  1 unit PRBC ordered by EDP.  [facility] hospitalist consulted for admission for sickle cell crisis.  Review of Systems: As per HPI otherwise negative.    Past Medical History:  Diagnosis Date  .  AKI (acute kidney injury) (HCC) 02/08/2021  . Anemia   . Depression   . Heart murmur   . Pneumonia    ?ACS requring intubation  . Seasonal allergies    wool & grass  . Sickle cell anemia (HCC)     Past Surgical History:  Procedure Laterality Date  . ROOT CANAL       reports that he has been smoking cigars. He has been smoking about 0.50 packs per day. He has never used smokeless tobacco. He reports current alcohol use. He reports current drug use. Drug: Marijuana.  Allergies  Allergen Reactions  . Aspirin Other (See Comments)    Increased heart rate  . Tramadol Hives and Itching    Family History  Problem Relation Age of Onset  . Alcohol abuse Mother   . Sickle cell trait Mother   . Hypertension Mother   . Alcohol abuse Father   . Sickle cell trait Father   . Early death Father        due to alcoholism  . Hypertension Father   . Hypertension Sister   . Hypertension Brother   . Hypertension Maternal Grandmother   . Hypertension Maternal Grandfather   . Hypertension Paternal Grandmother   . Hypertension Paternal Grandfather     Prior to Admission medications   Medication Sig Start Date End Date Taking? Authorizing Provider  Aspirin-Acetaminophen-Caffeine (GOODYS EXTRA STRENGTH) [phone] MG PACK Take 1 Package by mouth daily as needed (pain).   Yes [provider]  diphenhydrAMINE (BENADRYL) 25 MG tablet Take 25 mg by mouth every 6 (six) hours as needed for allergies.   Yes [provider]  oxyCODONE-acetaminophen (PERCOCET) 10-325 MG tablet Take 1 tablet by mouth every 4 (four) hours as needed for up to 15 days for pain. 01/31/21 02/15/21 Yes Barbette Merino, NP  ergocalciferol (VITAMIN D2) 1.25 MG (50000 UT) capsule Take 50,000 Units by mouth every 30 (thirty) days.  Patient not taking: Reported on 02/08/2021 07/07/20 07/07/21  [provider]    Physical Exam: Vitals:   02/08/21 0715 02/08/21 0730 02/08/21 0830 02/08/21 0930  BP: 115/60  116/63 116/64 114/62  Pulse:  70 69 66  Resp: (!) 9 14 10 12   SpO2:  93% 93% 93%    Constitutional: NAD, calm, comfortable, , thin and lean, on nasal cannula, communicating well Eyes: PERRL, lids and conjunctivae normal ENMT: Mucous membranes are moist. Posterior pharynx clear of any exudate or lesions.Normal dentition.  Neck: normal, supple, no masses, no thyromegaly Respiratory: clear to auscultation bilaterally, no wheezing, no crackles. Normal respiratory effort. No accessory muscle use.  Cardiovascular: Regular rate and rhythm, no murmurs / rubs / gallops. No extremity edema. 2+ pedal pulses. No carotid bruits.  Abdomen: no tenderness, no masses palpated. No hepatosplenomegaly. Bowel sounds positive.  Musculoskeletal: no clubbing / cyanosis. No joint deformity upper and lower extremities. Good ROM, no contractures. Normal muscle tone.  Skin: no rashes, lesions, ulcers. No induration Neurologic: CN 2-12 grossly intact. Sensation intact, DTR normal. Strength 5/5 in all 4.  Psychiatric: Normal judgment and insight. Alert and oriented x 3. Normal mood.    Labs on Admission: I have personally reviewed following labs and imaging studies  CBC: Recent Labs  Lab 02/07/21 1904 02/08/21 0736  WBC 9.4 12.6*  NEUTROABS 4.2 9.9*  HGB 7.0* 6.1*  HCT 20.2* 17.0*  MCV 90.2 87.2  PLT 175 140*   Basic Metabolic Panel: Recent Labs  Lab 02/07/21 1904 02/08/21 0736  NA 139 141  K 4.6 5.7*  CL 109 111  CO2 23 23  GLUCOSE 102* 91  BUN 18 26*  CREATININE 1.61* 1.94*  CALCIUM 9.1 9.0   GFR: CrCl cannot be calculated (Unknown ideal weight.). Liver Function Tests: Recent Labs  Lab 02/07/21 1904  AST 40  ALT 15  ALKPHOS 203*  BILITOT 2.1*  PROT 7.0  ALBUMIN 3.9   No results for input(s): LIPASE, AMYLASE in the last 168 hours. No results for input(s): AMMONIA in the last 168 hours. Coagulation Profile: No results for input(s): INR, PROTIME in the last 168 hours. Cardiac  Enzymes: No results for input(s): CKTOTAL, CKMB, CKMBINDEX, TROPONINI in the last 168 hours. BNP (last 3 results) No results for input(s): PROBNP in the last 8760 hours. HbA1C: No results for input(s): HGBA1C in the last 72 hours. CBG: No results for input(s): GLUCAP in the last 168 hours. Lipid Profile: No results for input(s): CHOL, HDL, LDLCALC, TRIG, CHOLHDL, LDLDIRECT in the last 72 hours. Thyroid Function Tests: No results for input(s): TSH, T4TOTAL, FREET4, T3FREE, THYROIDAB in the last 72 hours. Anemia Panel: Recent Labs    02/07/21 1803  RETICCTPCT 9.4*   Urine analysis: No results found for: COLORURINE, APPEARANCEUR, LABSPEC, PHURINE, GLUCOSEU, HGBUR, BILIRUBINUR, KETONESUR, PROTEINUR, UROBILINOGEN, NITRITE, LEUKOCYTESUR  Radiological Exams on Admission:   Assessment/Plan Principal Problem:   Sickle-cell crisis (HCC) Active Problems:   Sickle cell anemia (HCC)   Sickle cell pain crisis (HCC)   Tobacco use  Homelessness   Hyperkalemia   Thrombocytopenia (HCC)   Leukocytosis   AKI (acute kidney injury) (HCC)    Sickle cell anemia: -H&H noted to be 6.1/17.0 -Transfuse 1 unit PRBC.  Monitor H&H closely. -Start folic acid and multivitamin supplements  Sickle cell pain crisis: -Continue Percocet and Dilaudid as needed for pain control. -Continue with gentle hydration -He needs to follow-up with sickle cell clinic  AKI: -Creatinine: 1.94, GFR: 40.  Baseline creatinine 1, GFR more than 60 -Continue with gentle hydration.  Avoid nephrotoxic medication.  Repeat BMP tomorrow a.m.  Hyperkalemia: Potassium 5.7 -Secondary to hemolysis. -Continue IV hydration.  Repeat BMP tomorrow a.m.  Thrombocytopenia: Platelet 140. -No signs of active bleeding.  Continue to monitor  Leukocytosis: WBC of 12.6.  Likely reactive.  Continue to monitor.  He is afebrile.  Chest x-ray is negative for infection.  UA and COVID-19 is pending.  No indication of antibiotics at this  time.  Tobacco abuse: -Smokes 3 to 5 cigars/day. -Nicotine patch ordered. -  Counseled about cessation  Cocaine abuse: Check UDS.  Last intake couple of weeks ago. -Counseled about cessation.  Homeless: Consult TOC.  DVT prophylaxis: Lovenox/SCD Code Status: Full code Family Communication: None present at bedside.  Plan of care discussed with patient in length and he verbalized understanding and agreed with it. Disposition Plan: To be determined Consults called: None Admission status: Inpatient   Ollen Bowl MD [facility]  If 7PM-7AM, please contact night-coverage [email or web address]  02/08/2021, 9:40 AM

## 2021-02-08 NOTE — ED Triage Notes (Signed)
Patient complaining of sickle cell pain. patient was here last night for the same thing and did not want to leave.

## 2021-02-08 NOTE — Progress Notes (Signed)
Subjective: Patient is a 55 year old admitted this morning with sickle cell crisis.  Patient is on Dilaudid pushes oral medications.  No PCA at the moment.  Pain is 6 out of 10 initially but now jumped to 8 out of 10.  No fever or chills.  Objective: Vital signs in last 24 hours: Temp:  [97.9 F (36.6 C)-98.9 F (37.2 C)] 98.8 F (37.1 C) (04/20 1219) Pulse Rate:  [60-91] 76 (04/20 1219) Resp:  [9-22] 18 (04/20 1219) BP: (107-136)/(50-94) 122/77 (04/20 1219) SpO2:  [67 %-100 %] 89 % (04/20 1219) Weight change:     Intake/Output from previous day: No intake/output data recorded. Intake/Output this shift: Total I/O In: 315 [Blood:315] Out: -   General appearance: alert, cooperative, appears stated age and no distress Neck: no adenopathy, no carotid bruit, no JVD, supple, symmetrical, trachea midline and thyroid not enlarged, symmetric, no tenderness/mass/nodules Back: symmetric, no curvature. ROM normal. No CVA tenderness. Resp: clear to auscultation bilaterally Cardio: regular rate and rhythm, S1, S2 normal, no murmur, click, rub or gallop GI: soft, non-tender; bowel sounds normal; no masses,  no organomegaly Extremities: extremities normal, atraumatic, no cyanosis or edema Pulses: 2+ and symmetric Skin: Skin color, texture, turgor normal. No rashes or lesions Neurologic: Grossly normal  Lab Results: Recent Labs    02/07/21 1904 02/08/21 0736  WBC 9.4 12.6*  HGB 7.0* 6.1*  HCT 20.2* 17.0*  PLT 175 140*   BMET Recent Labs    02/07/21 1904 02/08/21 0736  NA 139 141  K 4.6 5.7*  CL 109 111  CO2 23 23  GLUCOSE 102* 91  BUN 18 26*  CREATININE 1.61* 1.94*  CALCIUM 9.1 9.0    Studies/Results: DG Chest Portable 1 View  Result Date: 02/08/2021 CLINICAL DATA:  Shortness of breath EXAM: PORTABLE CHEST 1 VIEW COMPARISON:  02/07/2021 FINDINGS: Improved interstitial opacity and cephalized blood flow. Stable cardiomegaly. No visible effusion or air leak. IMPRESSION:  Improved CHF. Electronically Signed   By: Marnee Spring M.D.   On: 02/08/2021 07:55   DG Chest Port 1 View  Result Date: 02/07/2021 CLINICAL DATA:  Sickle cell crisis and cough, initial encounter EXAM: PORTABLE CHEST 1 VIEW COMPARISON:  09/11/2020 FINDINGS: Cardiac shadow is enlarged but stable. Aortic calcifications are noted. Vascular congestion and interstitial edema is again identified and stable. Pleural effusions have improved in the interval. No focal confluent infiltrate is seen. No bony abnormality is noted. IMPRESSION: Changes consistent with CHF. Electronically Signed   By: Alcide Clever M.D.   On: 02/07/2021 22:11    Medications: I have reviewed the patient's current medications.  Assessment/Plan: A 55 year old admitted with sickle cell painful crisis.  #1 sickle cell painful crisis: I will switch patient to Dilaudid PCA today.  Continue pain management and supportive care.  #2 chronic kidney disease stage III: AKI added.  Continue hydration.  #3 anemia of chronic disease: Hemoglobin dropped to 6.1.  Patient's baseline is close by.  Transfuse 1 unit of packed red blood cells   #4 chronic pain syndrome: Continue home regimen.  #5 tobacco abuse: Counseling provided.  Continue with nicotine patch   LOS: 0 days   Janeil Schexnayder,LAWAL 02/08/2021, 1:43 PM

## 2021-02-08 NOTE — ED Notes (Signed)
Pt placed on 3L nasal cannula 
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
## 2021-02-08 NOTE — Progress Notes (Signed)
Date and time results received: 02/08/21 Results not called in and notified but resulted at 1837 and updated in system at 2003   Test: Hgb Critical Value: 6.3  Name of Provider Notified: Emeline Darling APP  Awaiting response

## 2021-02-08 NOTE — ED Notes (Signed)
Lab called to add on Magnesium  

## 2021-02-09 LAB — COMPREHENSIVE METABOLIC PANEL
ALT: 16 U/L (ref 0–44)
AST: 43 U/L — ABNORMAL HIGH (ref 15–41)
Albumin: 3.5 g/dL (ref 3.5–5.0)
Alkaline Phosphatase: 190 U/L — ABNORMAL HIGH (ref 38–126)
Anion gap: 5 (ref 5–15)
BUN: 25 mg/dL — ABNORMAL HIGH (ref 6–20)
CO2: 22 mmol/L (ref 22–32)
Calcium: 8.7 mg/dL — ABNORMAL LOW (ref 8.9–10.3)
Chloride: 113 mmol/L — ABNORMAL HIGH (ref 98–111)
Creatinine, Ser: 1.48 mg/dL — ABNORMAL HIGH (ref 0.61–1.24)
GFR, Estimated: 56 mL/min — ABNORMAL LOW (ref >60)
Glucose, Bld: 89 mg/dL (ref 70–99)
Potassium: 5.4 mmol/L — ABNORMAL HIGH (ref 3.5–5.1)
Sodium: 140 mmol/L (ref 135–145)
Total Bilirubin: 1.8 mg/dL — ABNORMAL HIGH (ref 0.3–1.2)
Total Protein: 6.2 g/dL — ABNORMAL LOW (ref 6.5–8.1)

## 2021-02-09 LAB — CBC
HCT: 21.4 % — ABNORMAL LOW (ref 39.0–52.0)
Hemoglobin: 7.5 g/dL — ABNORMAL LOW (ref 13.0–17.0)
MCH: 30.4 pg (ref 26.0–34.0)
MCHC: 35 g/dL (ref 30.0–36.0)
MCV: 86.6 fL (ref 80.0–100.0)
Platelets: 160 10*3/uL (ref 150–400)
RBC: 2.47 MIL/uL — ABNORMAL LOW (ref 4.22–5.81)
RDW: 22 % — ABNORMAL HIGH (ref 11.5–15.5)
WBC: 9.7 10*3/uL (ref 4.0–10.5)
nRBC: 4.3 % — ABNORMAL HIGH (ref 0.0–0.2)

## 2021-02-09 LAB — PREPARE RBC (CROSSMATCH)

## 2021-02-09 NOTE — Progress Notes (Signed)
Subjective: Patient still has some pain in backs and legs.  No fever or chills no nausea vomiting or diarrhea.  Patient is homeless.  He is also having significant nausea but no vomiting.  Patient is homeless.  He also has had significant pain but vital stable.  Low-grade temperature.  Still has hyperkalemia.  Renal function improved with creatinine down to 1.4.  Objective: Vital signs in last 24 hours: Temp:  [98.2 F (36.8 C)-99.5 F (37.5 C)] 99.5 F (37.5 C) (04/21 2054) Pulse Rate:  [62-84] 84 (04/21 2054) Resp:  [14-21] 16 (04/21 2054) BP: (116-138)/(47-77) 121/55 (04/21 2054) SpO2:  [91 %-99 %] 96 % (04/21 2054) Weight change:  Last BM Date: 02/08/21  Intake/Output from previous day: 04/20 0701 - 04/21 0700 In: 2424.1 [P.O.:813; I.V.:635.3; Blood:975.8] Out: 1551 [Urine:1551] Intake/Output this shift: Total I/O In: -  Out: 500 [Urine:500]  General appearance: alert, cooperative, appears stated age and no distress Neck: no adenopathy, no carotid bruit, no JVD, supple, symmetrical, trachea midline and thyroid not enlarged, symmetric, no tenderness/mass/nodules Back: symmetric, no curvature. ROM normal. No CVA tenderness. Resp: clear to auscultation bilaterally Cardio: regular rate and rhythm, S1, S2 normal, no murmur, click, rub or gallop GI: soft, non-tender; bowel sounds normal; no masses,  no organomegaly Extremities: extremities normal, atraumatic, no cyanosis or edema Pulses: 2+ and symmetric Skin: Skin color, texture, turgor normal. No rashes or lesions Neurologic: Grossly normal  Lab Results: Recent Labs    02/08/21 0736 02/08/21 1837 02/08/21 2231 02/09/21 0532  WBC 12.6*  --   --  9.7  HGB 6.1*   < > 6.2* 7.5*  HCT 17.0*   < > 17.8* 21.4*  PLT 140*  --   --  160   < > = values in this interval not displayed.   BMET Recent Labs    02/08/21 0736 02/09/21 0532  NA 141 140  K 5.7* 5.4*  CL 111 113*  CO2 23 22  GLUCOSE 91 89  BUN 26* 25*   CREATININE 1.94* 1.48*  CALCIUM 9.0 8.7*    Studies/Results: DG Chest Portable 1 View  Result Date: 02/08/2021 CLINICAL DATA:  Shortness of breath EXAM: PORTABLE CHEST 1 VIEW COMPARISON:  02/07/2021 FINDINGS: Improved interstitial opacity and cephalized blood flow. Stable cardiomegaly. No visible effusion or air leak. IMPRESSION: Improved CHF. Electronically Signed   By: Marnee Spring M.D.   On: 02/08/2021 07:55    Medications: I have reviewed the patient's current medications.  Assessment/Plan: A 55 year old admitted with sickle cell painful crisis.  #1 sickle cell painful crisis: I we will continue with Dilaudid PCA oral agents as necessary.  Continue pain management and supportive care.  #2  Acute on chronic kidney disease stage III: Slightly improved.  Continue hydration.  #3 anemia of chronic disease: Hemoglobin dropped to 6.1.  Patient's baseline is close by.  Transfuse 1 unit of packed red blood cells   #4 chronic pain syndrome: Continue home regimen.  #5 tobacco abuse: Counseling provided.  Continue with nicotine patch   LOS: 1 day   Meagon Duskin,LAWAL 02/09/2021, 11:53 PM

## 2021-02-09 NOTE — Progress Notes (Signed)
Patient loss IV access at 1557 and PCA pump and fluids were paused. This Scott Norton attempted to secure new IV access and consulted a second RN when that attempt failed. After second RN failed an IV attempt an order was placed to consult the IV team. The IV team RN attempted twice and was not successful, stated they would be back with ultrasound when they were able. Patient is aware and states pain is "ok right now". Will obtain IV access as soon as possible.

## 2021-02-10 LAB — TYPE AND SCREEN
ABO/RH(D): O POS
Antibody Screen: NEGATIVE
Donor AG Type: NEGATIVE
Donor AG Type: NEGATIVE
Unit division: 0
Unit division: 0

## 2021-02-10 LAB — BPAM RBC
Blood Product Expiration Date: 202205052359
Blood Product Expiration Date: 202205062359
ISSUE DATE / TIME: 202204201152
ISSUE DATE / TIME: 202204210001
Unit Type and Rh: 5100
Unit Type and Rh: 5100

## 2021-02-10 NOTE — Progress Notes (Signed)
Subjective: Patient has some improvement in pain.  Still has significant pain in the hips and legs.  Down to 7 out of 10.  He is homeless but is insistent he has his home meds.  No fever or chills no nausea vomiting or diarrhea.  Objective: Vital signs in last 24 hours: Temp:  [98.3 F (36.8 C)-99.5 F (37.5 C)] 98.3 F (36.8 C) (04/22 1124) Pulse Rate:  [50-84] 50 (04/22 1124) Resp:  [14-21] 15 (04/22 1209) BP: (106-121)/(54-63) 106/61 (04/22 1124) SpO2:  [93 %-99 %] 98 % (04/22 1209) Weight:  [78.2 kg] 78.2 kg (04/22 0459) Weight change: 1.134 kg Last BM Date: 02/08/21  Intake/Output from previous day: 04/21 0701 - 04/22 0700 In: 1245 [P.O.:470; I.V.:775] Out: 3150 [Urine:3150] Intake/Output this shift: Total I/O In: -  Out: 250 [Urine:250]  General appearance: alert, cooperative, appears stated age and no distress Neck: no adenopathy, no carotid bruit, no JVD, supple, symmetrical, trachea midline and thyroid not enlarged, symmetric, no tenderness/mass/nodules Back: symmetric, no curvature. ROM normal. No CVA tenderness. Resp: clear to auscultation bilaterally Cardio: regular rate and rhythm, S1, S2 normal, no murmur, click, rub or gallop GI: soft, non-tender; bowel sounds normal; no masses,  no organomegaly Extremities: extremities normal, atraumatic, no cyanosis or edema Pulses: 2+ and symmetric Skin: Skin color, texture, turgor normal. No rashes or lesions Neurologic: Grossly normal  Lab Results: Recent Labs    02/08/21 0736 02/08/21 1837 02/08/21 2231 02/09/21 0532  WBC 12.6*  --   --  9.7  HGB 6.1*   < > 6.2* 7.5*  HCT 17.0*   < > 17.8* 21.4*  PLT 140*  --   --  160   < > = values in this interval not displayed.   BMET Recent Labs    02/08/21 0736 02/09/21 0532  NA 141 140  K 5.7* 5.4*  CL 111 113*  CO2 23 22  GLUCOSE 91 89  BUN 26* 25*  CREATININE 1.94* 1.48*  CALCIUM 9.0 8.7*    Studies/Results: No results found.  Medications: I have  reviewed the patient's current medications.  Assessment/Plan: A 55 year old admitted with sickle cell painful crisis.  #1 sickle cell painful crisis: Patient will be maintained on current Dilaudid PCA with no Toradol but on IV fluids.  Continue monitoring.  Continue pain management and supportive care.  #2  Acute on chronic kidney disease stage III: Slightly improved.  Continue hydration.  #3 anemia of chronic disease: Hemoglobin dropped to 6.1.  Patient's baseline is close by.  Transfuse 1 unit of packed red blood cells   #4 chronic pain syndrome: Continue home regimen.  #5 tobacco abuse: Counseling provided.  Continue with nicotine patch   LOS: 2 days   Scott Norton,LAWAL 02/10/2021, 1:41 PM

## 2021-02-11 LAB — CBC WITH DIFFERENTIAL/PLATELET
Abs Immature Granulocytes: 0.02 10*3/uL (ref 0.00–0.07)
Basophils Absolute: 0 10*3/uL (ref 0.0–0.1)
Basophils Relative: 0 %
Eosinophils Absolute: 0.3 10*3/uL (ref 0.0–0.5)
Eosinophils Relative: 4 %
HCT: 19.9 % — ABNORMAL LOW (ref 39.0–52.0)
Hemoglobin: 7.1 g/dL — ABNORMAL LOW (ref 13.0–17.0)
Immature Granulocytes: 0 %
Lymphocytes Relative: 23 %
Lymphs Abs: 1.5 10*3/uL (ref 0.7–4.0)
MCH: 30.5 pg (ref 26.0–34.0)
MCHC: 35.7 g/dL (ref 30.0–36.0)
MCV: 85.4 fL (ref 80.0–100.0)
Monocytes Absolute: 1.1 10*3/uL — ABNORMAL HIGH (ref 0.1–1.0)
Monocytes Relative: 17 %
Neutro Abs: 3.6 10*3/uL (ref 1.7–7.7)
Neutrophils Relative %: 56 %
Platelets: 152 10*3/uL (ref 150–400)
RBC: 2.33 MIL/uL — ABNORMAL LOW (ref 4.22–5.81)
RDW: 24.3 % — ABNORMAL HIGH (ref 11.5–15.5)
WBC: 6.5 10*3/uL (ref 4.0–10.5)
nRBC: 9.1 % — ABNORMAL HIGH (ref 0.0–0.2)

## 2021-02-11 LAB — COMPREHENSIVE METABOLIC PANEL
ALT: 16 U/L (ref 0–44)
AST: 41 U/L (ref 15–41)
Albumin: 3.4 g/dL — ABNORMAL LOW (ref 3.5–5.0)
Alkaline Phosphatase: 208 U/L — ABNORMAL HIGH (ref 38–126)
Anion gap: 6 (ref 5–15)
BUN: 14 mg/dL (ref 6–20)
CO2: 22 mmol/L (ref 22–32)
Calcium: 8.8 mg/dL — ABNORMAL LOW (ref 8.9–10.3)
Chloride: 108 mmol/L (ref 98–111)
Creatinine, Ser: 0.98 mg/dL (ref 0.61–1.24)
GFR, Estimated: >60 mL/min (ref >60)
Glucose, Bld: 107 mg/dL — ABNORMAL HIGH (ref 70–99)
Potassium: 4.7 mmol/L (ref 3.5–5.1)
Sodium: 136 mmol/L (ref 135–145)
Total Bilirubin: 2.1 mg/dL — ABNORMAL HIGH (ref 0.3–1.2)
Total Protein: 6.4 g/dL — ABNORMAL LOW (ref 6.5–8.1)

## 2021-02-11 NOTE — Progress Notes (Signed)
SATURATION QUALIFICATIONS: (This note is used to comply with regulatory documentation for home oxygen)  Patient Saturations on Room Air at Rest = 88%  Patient Saturations on Room Air while Ambulating = 79%  Patient Saturations on 3 Liters of oxygen while Ambulating = 92%  Please briefly explain why patient needs home oxygen:

## 2021-02-11 NOTE — Progress Notes (Signed)
Subjective: Patient has is still complaining of significant pain and was noted to be hypoxic on room air.  He is a smoker but no history of COPD.  He had issues with his IV lines last night.  This has been corrected looks like.  He otherwise has no other complaint.  He is eating and drinking and has moved around the room.  Objective: Vital signs in last 24 hours: Temp:  [98.7 F (37.1 C)-99.7 F (37.6 C)] 99.7 F (37.6 C) (04/23 2005) Pulse Rate:  [65-71] 69 (04/23 2005) Resp:  [14-17] 16 (04/23 2033) BP: (115-129)/(61-84) 121/68 (04/23 2005) SpO2:  [89 %-96 %] 94 % (04/23 2005) Weight:  [76 kg] 76 kg (04/23 0550) Weight change: -2.2 kg Last BM Date: 02/10/21  Intake/Output from previous day: 04/22 0701 - 04/23 0700 In: 2427.9 [P.O.:720; I.V.:1707.9] Out: 2750 [Urine:2750] Intake/Output this shift: Total I/O In: -  Out: 190 [Urine:190]  General appearance: alert, cooperative, appears stated age and no distress Neck: no adenopathy, no carotid bruit, no JVD, supple, symmetrical, trachea midline and thyroid not enlarged, symmetric, no tenderness/mass/nodules Back: symmetric, no curvature. ROM normal. No CVA tenderness. Resp: clear to auscultation bilaterally Cardio: regular rate and rhythm, S1, S2 normal, no murmur, click, rub or gallop GI: soft, non-tender; bowel sounds normal; no masses,  no organomegaly Extremities: extremities normal, atraumatic, no cyanosis or edema Pulses: 2+ and symmetric Skin: Skin color, texture, turgor normal. No rashes or lesions Neurologic: Grossly normal  Lab Results: Recent Labs    02/09/21 0532 02/11/21 1110  WBC 9.7 6.5  HGB 7.5* 7.1*  HCT 21.4* 19.9*  PLT 160 152   BMET Recent Labs    02/09/21 0532 02/11/21 1110  NA 140 136  K 5.4* 4.7  CL 113* 108  CO2 22 22  GLUCOSE 89 107*  BUN 25* 14  CREATININE 1.48* 0.98  CALCIUM 8.7* 8.8*    Studies/Results: No results found.  Medications: I have reviewed the patient's current  medications.  Assessment/Plan: A 55 year old admitted with sickle cell painful crisis.  #1 sickle cell painful crisis: Pain is improving although other numbers are not.  Patient will be maintained on current Dilaudid PCA with no Toradol but on IV fluids.  Continue monitoring.  Continue pain management and supportive care.  #2  Acute on chronic kidney disease stage III: Renal function has improved creatinine down to 0.98.  Continue to monitor..  Continue hydration.  #3 anemia of chronic disease: Hemoglobin dropped to 6.1.  Patient's baseline is close by.  Transfuse 1 unit of packed red blood cells   #4 chronic pain syndrome: Continue home regimen.  #5 tobacco abuse: Counseling provided.  Continue with nicotine patch  #6 hyperkalemia: This is corrected.  #7 acute hypoxia: We will decrease IV fluids.  May check chest x-ray to rule out acute chest.   LOS: 3 days   Kailin Leu,LAWAL 02/11/2021, 10:56 PM

## 2021-02-12 LAB — HEMOGLOBIN AND HEMATOCRIT, BLOOD
HCT: 21.5 % — ABNORMAL LOW (ref 39.0–52.0)
Hemoglobin: 7.8 g/dL — ABNORMAL LOW (ref 13.0–17.0)

## 2021-02-12 LAB — COMPREHENSIVE METABOLIC PANEL
ALT: 15 U/L (ref 0–44)
AST: 43 U/L — ABNORMAL HIGH (ref 15–41)
Albumin: 3.2 g/dL — ABNORMAL LOW (ref 3.5–5.0)
Alkaline Phosphatase: 240 U/L — ABNORMAL HIGH (ref 38–126)
Anion gap: 9 (ref 5–15)
BUN: 14 mg/dL (ref 6–20)
CO2: 19 mmol/L — ABNORMAL LOW (ref 22–32)
Calcium: 8.8 mg/dL — ABNORMAL LOW (ref 8.9–10.3)
Chloride: 109 mmol/L (ref 98–111)
Creatinine, Ser: 1.11 mg/dL (ref 0.61–1.24)
GFR, Estimated: >60 mL/min (ref >60)
Glucose, Bld: 134 mg/dL — ABNORMAL HIGH (ref 70–99)
Potassium: 4.9 mmol/L (ref 3.5–5.1)
Sodium: 137 mmol/L (ref 135–145)
Total Bilirubin: 2.5 mg/dL — ABNORMAL HIGH (ref 0.3–1.2)
Total Protein: 5.9 g/dL — ABNORMAL LOW (ref 6.5–8.1)

## 2021-02-12 LAB — CBC WITH DIFFERENTIAL/PLATELET
Abs Immature Granulocytes: 0.04 10*3/uL (ref 0.00–0.07)
Basophils Absolute: 0 10*3/uL (ref 0.0–0.1)
Basophils Relative: 0 %
Eosinophils Absolute: 0.3 10*3/uL (ref 0.0–0.5)
Eosinophils Relative: 3 %
HCT: 17.7 % — ABNORMAL LOW (ref 39.0–52.0)
Hemoglobin: 6.3 g/dL — CL (ref 13.0–17.0)
Immature Granulocytes: 0 %
Lymphocytes Relative: 29 %
Lymphs Abs: 2.6 10*3/uL (ref 0.7–4.0)
MCH: 30 pg (ref 26.0–34.0)
MCHC: 35.6 g/dL (ref 30.0–36.0)
MCV: 84.3 fL (ref 80.0–100.0)
Monocytes Absolute: 1.5 10*3/uL — ABNORMAL HIGH (ref 0.1–1.0)
Monocytes Relative: 16 %
Neutro Abs: 4.6 10*3/uL (ref 1.7–7.7)
Neutrophils Relative %: 52 %
Platelets: 163 10*3/uL (ref 150–400)
RBC: 2.1 MIL/uL — ABNORMAL LOW (ref 4.22–5.81)
RDW: 24 % — ABNORMAL HIGH (ref 11.5–15.5)
WBC: 9.1 10*3/uL (ref 4.0–10.5)
nRBC: 6.7 % — ABNORMAL HIGH (ref 0.0–0.2)

## 2021-02-12 LAB — PREPARE RBC (CROSSMATCH)

## 2021-02-12 MED ORDER — SODIUM CHLORIDE 0.9% IV SOLUTION: Freq: Once | INTRAVENOUS | Status: AC

## 2021-02-12 NOTE — Progress Notes (Signed)
Education provided to patient about adult sickle cell anemia and dehydration. Patient had questions about both.

## 2021-02-12 NOTE — Progress Notes (Signed)
Subjective: Patient has had significant event overnight.  Hemoglobin has dropped to 6.3 which is what he had prior to initial transfusion on admission.  Also has not hypoxia requiring 2 L of oxygen.  No fever no chills no nausea vomiting or diarrhea.  Objective: Vital signs in last 24 hours: Temp:  [98.7 F (37.1 C)-101 F (38.3 C)] 98.9 F (37.2 C) (04/24 1109) Pulse Rate:  [65-89] 70 (04/24 1109) Resp:  [13-18] 16 (04/24 1208) BP: (115-126)/(56-78) 118/71 (04/24 1109) SpO2:  [89 %-100 %] 92 % (04/24 1208) Weight change:  Last BM Date: 02/10/21  Intake/Output from previous day: 04/23 0701 - 04/24 0700 In: 480 [P.O.:480] Out: 1390 [Urine:1390] Intake/Output this shift: Total I/O In: 240 [P.O.:240] Out: 600 [Urine:600]  General appearance: alert, cooperative, appears stated age and no distress Neck: no adenopathy, no carotid bruit, no JVD, supple, symmetrical, trachea midline and thyroid not enlarged, symmetric, no tenderness/mass/nodules Back: symmetric, no curvature. ROM normal. No CVA tenderness. Resp: clear to auscultation bilaterally Cardio: regular rate and rhythm, S1, S2 normal, no murmur, click, rub or gallop GI: soft, non-tender; bowel sounds normal; no masses,  no organomegaly Extremities: extremities normal, atraumatic, no cyanosis or edema Pulses: 2+ and symmetric Skin: Skin color, texture, turgor normal. No rashes or lesions Neurologic: Grossly normal  Lab Results: Recent Labs    02/11/21 1110 02/12/21 0546  WBC 6.5 9.1  HGB 7.1* 6.3*  HCT 19.9* 17.7*  PLT 152 163   BMET Recent Labs    02/11/21 1110 02/12/21 0546  NA 136 137  K 4.7 4.9  CL 108 109  CO2 22 19*  GLUCOSE 107* 134*  BUN 14 14  CREATININE 0.98 1.11  CALCIUM 8.8* 8.8*    Studies/Results: No results found.  Medications: I have reviewed the patient's current medications.  Assessment/Plan: A 55 year old admitted with sickle cell painful crisis.  #1 sickle cell painful crisis:  Pain is doing better.  Hemoglobin has dropped and will transfuse 1 unit of red blood cells.  Patient will be maintained on current Dilaudid PCA with no Toradol but on IV fluids.  Continue monitoring.  Continue pain management and supportive care.  #2  Acute on chronic kidney disease stage III: Renal function has improved creatinine down to 0.98.  Continue to monitor..  Continue hydration.  #3 anemia of chronic disease: Hemoglobin dropped to 6.3.  Patient's baseline is close by.  Transfuse 1 unit of packed red blood cells   #4 chronic pain syndrome: Continue home regimen.  #5 tobacco abuse: Counseling provided.  Continue with nicotine patch  #6 hyperkalemia: This is corrected.  #7 acute hypoxia: We will decrease IV fluids.  May check chest x-ray to rule out acute chest.   LOS: 4 days   Savahanna Almendariz,LAWAL 02/12/2021, 2:16 PM

## 2021-02-12 NOTE — Progress Notes (Signed)
Date and time results received: 02/12/21 7:29 AM  (use smartphrase ".now" to insert current time)  Test: hgb Critical Value: 6.3  Name of Provider Notified: Garba  Orders Received? Or Actions Taken?: Actions Taken: waiting on md to reply

## 2021-02-13 DIAGNOSIS — D5700 Hb-SS disease with crisis, unspecified: Principal | ICD-10-CM

## 2021-02-13 LAB — BPAM RBC
Blood Product Expiration Date: 202205132359
ISSUE DATE / TIME: 202204241620
Unit Type and Rh: 5100

## 2021-02-13 LAB — CBC
HCT: 22.4 % — ABNORMAL LOW (ref 39.0–52.0)
Hemoglobin: 8.1 g/dL — ABNORMAL LOW (ref 13.0–17.0)
MCH: 30.8 pg (ref 26.0–34.0)
MCHC: 36.2 g/dL — ABNORMAL HIGH (ref 30.0–36.0)
MCV: 85.2 fL (ref 80.0–100.0)
Platelets: 167 10*3/uL (ref 150–400)
RBC: 2.63 MIL/uL — ABNORMAL LOW (ref 4.22–5.81)
RDW: 21.8 % — ABNORMAL HIGH (ref 11.5–15.5)
WBC: 7.7 10*3/uL (ref 4.0–10.5)
nRBC: 0.8 % — ABNORMAL HIGH (ref 0.0–0.2)

## 2021-02-13 LAB — TYPE AND SCREEN
ABO/RH(D): O POS
Antibody Screen: NEGATIVE
Unit division: 0

## 2021-02-13 MED ORDER — HYDROMORPHONE 1 MG/ML IV SOLN
INTRAVENOUS | Status: DC
Start: 2021-02-13 — End: 2021-02-14
  Administered 2021-02-13: 2 mg via INTRAVENOUS

## 2021-02-13 NOTE — Progress Notes (Signed)
Subjective: Scott Norton is a 55 year old male with a medical history significant for sickle cell disease, chronic pain syndrome, opiate dependence and tolerance, history of anemia of chronic disease was admitted for sickle cell pain crisis. Today, patient's hemoglobin has improved to 8.1.  He is status post 1 unit PRBCs on yesterday.  He continues to complain of pain primarily to low back and lower extremities.  He rates his pain as 6/10.  He denies any headache, chest pain, urinary symptoms, nausea, vomiting, or diarrhea.  Objective:  Vital signs in last 24 hours:  Vitals:   02/13/21 0458 02/13/21 0748 02/13/21 1036 02/13/21 1126  BP: 126/70  134/69   Pulse: (!) 53  63   Resp: 14 16 14 16   Temp: 97.8 F (36.6 C)  98.4 F (36.9 C)   TempSrc: Oral  Oral   SpO2: 93% 95% 98% 98%  Weight: 75.1 kg     Height:        Intake/Output from previous day:   Intake/Output Summary (Last 24 hours) at 02/13/2021 1338 Last data filed at 02/13/2021 1036 Gross per 24 hour  Intake 3381.92 ml  Output 1200 ml  Net 2181.92 ml    Physical Exam: General: Alert, awake, oriented x3, in no acute distress.  HEENT: [place]/AT PEERL, EOMI Neck: Trachea midline,  no masses, no thyromegal,y no JVD, no carotid bruit OROPHARYNX:  Moist, No exudate/ erythema/lesions.  Heart: Regular rate and rhythm, without murmurs, rubs, gallops, PMI non-displaced, no heaves or thrills on palpation.  Lungs: Clear to auscultation, no wheezing or rhonchi noted. No increased vocal fremitus resonant to percussion  Abdomen: Soft, nontender, nondistended, positive bowel sounds, no masses no hepatosplenomegaly noted..  Neuro: No focal neurological deficits noted cranial nerves II through XII grossly intact. DTRs 2+ bilaterally upper and lower extremities. Strength 5 out of 5 in bilateral upper and lower extremities. Musculoskeletal: No warm swelling or erythema around joints, no spinal tenderness noted. Psychiatric: Patient alert and  oriented x3, good insight and cognition, good recent to remote recall. Lymph node survey: No cervical axillary or inguinal lymphadenopathy noted.  Lab Results:  Basic Metabolic Panel:    Component Value Date/Time   NA 137 02/12/2021 0546   NA 141 12/30/2020 1442   K 4.9 02/12/2021 0546   CL 109 02/12/2021 0546   CO2 19 (L) 02/12/2021 0546   BUN 14 02/12/2021 0546   BUN 22 12/30/2020 1442   CREATININE 1.11 02/12/2021 0546   CREATININE 0.90 07/19/2014 1128   GLUCOSE 134 (H) 02/12/2021 0546   CALCIUM 8.8 (L) 02/12/2021 0546   CBC:    Component Value Date/Time   WBC 7.7 02/13/2021 1203   HGB 8.1 (L) 02/13/2021 1203   HGB 7.5 (L) 12/30/2020 1442   HCT 22.4 (L) 02/13/2021 1203   HCT 22.4 (L) 12/30/2020 1442   PLT 167 02/13/2021 1203   PLT 228 12/30/2020 1442   MCV 85.2 02/13/2021 1203   MCV 94 12/30/2020 1442   NEUTROABS 4.6 02/12/2021 0546   NEUTROABS 6.8 12/30/2020 1442   LYMPHSABS 2.6 02/12/2021 0546   LYMPHSABS 2.8 12/30/2020 1442   MONOABS 1.5 (H) 02/12/2021 0546   EOSABS 0.3 02/12/2021 0546   EOSABS 0.0 12/30/2020 1442   BASOSABS 0.0 02/12/2021 0546   BASOSABS 0.0 12/30/2020 1442    Recent Results (from the past 240 hour(s))  Resp Panel by RT-PCR (Flu A&B, Covid) Nasopharyngeal Swab     Status: None   Collection Time: 02/08/21  9:55 AM   Specimen:  Nasopharyngeal Swab; Nasopharyngeal(NP) swabs in vial transport medium  Result Value Ref Range Status   SARS Coronavirus 2 by RT PCR NEGATIVE NEGATIVE Final    Comment: (NOTE) SARS-CoV-2 target nucleic acids are NOT DETECTED.  The SARS-CoV-2 RNA is generally detectable in upper respiratory specimens during the acute phase of infection. The lowest concentration of SARS-CoV-2 viral copies this assay can detect is 138 copies/mL. A negative result does not preclude SARS-Cov-2 infection and should not be used as the sole basis for treatment or other patient management decisions. A negative result may occur with  improper  specimen collection/handling, submission of specimen other than nasopharyngeal swab, presence of viral mutation(s) within the areas targeted by this assay, and inadequate number of viral copies(<138 copies/mL). A negative result must be combined with clinical observations, patient history, and epidemiological information. The expected result is Negative.  Fact Sheet for Patients:  BloggerCourse.com  Fact Sheet for Healthcare Providers:  SeriousBroker.it  This test is no t yet approved or cleared by the [place] FDA and  has been authorized for detection and/or diagnosis of SARS-CoV-2 by FDA under an Emergency Use Authorization (EUA). This EUA will remain  in effect (meaning this test can be used) for the duration of the COVID-19 declaration under Section 564(b)(1) of the Act, 21 U.S.C.section 360bbb-3(b)(1), unless the authorization is terminated  or revoked sooner.       Influenza A by PCR NEGATIVE NEGATIVE Final   Influenza B by PCR NEGATIVE NEGATIVE Final    Comment: (NOTE) The Xpert Xpress SARS-CoV-2/FLU/RSV plus assay is intended as an aid in the diagnosis of influenza from Nasopharyngeal swab specimens and should not be used as a sole basis for treatment. Nasal washings and aspirates are unacceptable for Xpert Xpress SARS-CoV-2/FLU/RSV testing.  Fact Sheet for Patients: BloggerCourse.com  Fact Sheet for Healthcare Providers: SeriousBroker.it  This test is not yet approved or cleared by the [place] FDA and has been authorized for detection and/or diagnosis of SARS-CoV-2 by FDA under an Emergency Use Authorization (EUA). This EUA will remain in effect (meaning this test can be used) for the duration of the COVID-19 declaration under Section 564(b)(1) of the Act, 21 U.S.C. section 360bbb-3(b)(1), unless the authorization is terminated or revoked.  Performed at  [facility], [address], [place], [place] [address]     Studies/Results: No results found.  Medications: Scheduled Meds: . enoxaparin (LOVENOX) injection  40 mg Subcutaneous Q24H  . folic acid  1 mg Oral Daily  . HYDROmorphone   Intravenous Q4H  . multivitamin with minerals  1 tablet Oral Daily  . nicotine  14 mg Transdermal Daily  . senna-docusate  1 tablet Oral BID   Continuous Infusions: . sodium chloride 10 mL/hr at 02/13/21 0305   PRN Meds:.diphenhydrAMINE **OR** diphenhydrAMINE, naloxone **AND** sodium chloride flush, ondansetron **OR** ondansetron (ZOFRAN) IV, ondansetron (ZOFRAN) IV, oxyCODONE-acetaminophen **AND** oxyCODONE, polyethylene glycol  Consultants:  None  Procedures:  None  Antibiotics:  None  Assessment/Plan: Principal Problem:   Sickle-cell crisis (HCC) Active Problems:   Sickle cell anemia (HCC)   Tobacco use   Homelessness   Hyperkalemia   Thrombocytopenia (HCC)   Leukocytosis   AKI (acute kidney injury) (HCC)   Cocaine abuse (HCC)  Sickle cell disease with pain crisis: Patient's pain intensity continues to improve daily.  Waning IV Dilaudid PCA.  Continue to hold Toradol due to acute kidney injury.  Monitor closely and transition to oral pain medications.  Possible discharge on 02/14/2021 Patient  encouraged to ambulate today  Acute on chronic kidney disease stage III: Renal functioning improving.  Continue to follow closely.  Avoid nephrotoxins.  Continue gentle hydration.  Anemia of chronic disease: Hemoglobin is improved to 8.1, which is consistent with patient's baseline.  No further transfusion warranted at this time.  Continue to follow closely.  Chronic pain syndrome: Continue home medications  Tobacco abuse: Smoking cessation instruction/counseling given:  counseled patient on the dangers of tobacco use, advised patient to stop smoking, and reviewed strategies to maximize success    Hyperkalemia: Resolved.  Continue to follow closely.  Acute hypoxia: Oxygen saturation 98% on room air today.  Code Status: Full Code Family Communication: N/A Disposition Plan: Not yet ready for discharge   Faris Coolman Rennis Petty  APRN, MSN, FNP-C [facility] [address] [place], [place] [address] [phone]  If 5PM-7AM, please contact night-coverage.  02/13/2021, 1:38 PM  LOS: 5 days

## 2021-02-13 NOTE — Care Management Important Message (Signed)
Important Message  Patient Details IM Letter given to the Patient. Name: Scott Norton MRN: [id number] Date of Birth: July 17, 1966   Medicare Important Message Given:  Yes     Caren Macadam 02/13/2021, 10:58 AM

## 2021-02-14 ENCOUNTER — Other Ambulatory Visit: Payer: Self-pay | Admitting: Nurse Practitioner

## 2021-02-14 ENCOUNTER — Other Ambulatory Visit (HOSPITAL_COMMUNITY): Payer: Self-pay

## 2021-02-14 LAB — BASIC METABOLIC PANEL
Anion gap: 5 (ref 5–15)
BUN: 16 mg/dL (ref 6–20)
CO2: 24 mmol/L (ref 22–32)
Calcium: 9.1 mg/dL (ref 8.9–10.3)
Chloride: 107 mmol/L (ref 98–111)
Creatinine, Ser: 1 mg/dL (ref 0.61–1.24)
GFR, Estimated: >60 mL/min (ref >60)
Glucose, Bld: 107 mg/dL — ABNORMAL HIGH (ref 70–99)
Potassium: 4.8 mmol/L (ref 3.5–5.1)
Sodium: 136 mmol/L (ref 135–145)

## 2021-02-14 LAB — CBC
HCT: 22.9 % — ABNORMAL LOW (ref 39.0–52.0)
Hemoglobin: 8.2 g/dL — ABNORMAL LOW (ref 13.0–17.0)
MCH: 30.6 pg (ref 26.0–34.0)
MCHC: 35.8 g/dL (ref 30.0–36.0)
MCV: 85.4 fL (ref 80.0–100.0)
Platelets: 194 10*3/uL (ref 150–400)
RBC: 2.68 MIL/uL — ABNORMAL LOW (ref 4.22–5.81)
RDW: 22.1 % — ABNORMAL HIGH (ref 11.5–15.5)
WBC: 7.7 10*3/uL (ref 4.0–10.5)
nRBC: 3.7 % — ABNORMAL HIGH (ref 0.0–0.2)

## 2021-02-14 NOTE — BH Specialist Note (Signed)
[facility] General Follow Up Note  02/14/2021 Name: Teja Wendorf MRN: [id number] DOB: 09/03/1966 Maika Hartis is a 55 y.o. year old male who sees King, Crystal M, NP for primary care. LCSW was initially consulted to assist the patient with housing and transportation.  Interpreter: No.   Interpreter Name & Language: none  Assessment: Patient experiencing transportation and housing barriers. Patient is currently homeless. He stays in hotels when he can. He receives social security income. He uses the bus for transportation.  Ongoing Intervention: Today CSW met with patient at bedside in [facility]. Patient was able to stay in the winter emergency shelter provided at a hotel by the [facility]), but that program has ended. He is searching for stable housing. CSW reminded patient of the housing information emailed to him after telephone encounter in March. Re-sent this information to patient. Patient is familiar with [facility] and other resources in the community.    Review of patient status, including review of consultants reports, relevant laboratory and other test results, and collaboration with appropriate care team members and the patient's provider was performed as part of comprehensive patient evaluation and provision of services.     Follow up Plan: 1. Will follow up by phone regarding transportation [facility]  2. CSW available as needed from clinic  Susan Porter, LCSW [facility] [phone]    

## 2021-02-14 NOTE — TOC Transition Note (Signed)
Transition of Care [facility]) - CM/SW Discharge Note   Patient Details  Name: Scott Norton MRN: [id number] Date of Birth: 01/21/1966  [facility] CM/SW Contact:  Bartholome Bill, RN Phone Number: 02/14/2021, 9:55 AM   Clinical Narrative:     Pt given homeless resources in the ED. Substance abuse resources placed on AVS

## 2021-02-14 NOTE — Discharge Instructions (Signed)
Sickle Cell Anemia, Adult  Sickle cell anemia is a condition where your red blood cells are shaped like sickles. Red blood cells carry oxygen through the body. Sickle-shaped cells do not live as long as normal red blood cells. They also clump together and block blood from flowing through the blood vessels. This prevents the body from getting enough oxygen. Sickle cell anemia causes organ damage and pain. It also increases the risk of infection. Follow these instructions at home: Medicines  Take over-the-counter and prescription medicines only as told by your doctor.  If you were prescribed an antibiotic medicine, take it as told by your doctor. Do not stop taking the antibiotic even if you start to feel better.  If you develop a fever, do not take medicines to lower the fever right away. Tell your doctor about the fever. Managing pain, stiffness, and swelling  Try these methods to help with pain: ? Use a heating pad. ? Take a warm bath. ? Distract yourself, such as by watching TV. Eating and drinking  Drink enough fluid to keep your pee (urine) clear or pale yellow. Drink more in hot weather and during exercise.  Limit or avoid alcohol.  Eat a healthy diet. Eat plenty of fruits, vegetables, whole grains, and lean protein.  Take vitamins and supplements as told by your doctor. Traveling  When traveling, keep these with you: ? Your medical information. ? The names of your doctors. ? Your medicines.  If you need to take an airplane, talk to your doctor first. Activity  Rest often.  Avoid exercises that make your heart beat much faster, such as jogging. General instructions  Do not use products that have nicotine or tobacco, such as cigarettes and e-cigarettes. If you need help quitting, ask your doctor.  Consider wearing a medical alert bracelet.  Avoid being in high places (high altitudes), such as mountains.  Avoid very hot or cold temperatures.  Avoid places where the  temperature changes a lot.  Keep all follow-up visits as told by your doctor. This is important. Contact a doctor if:  A joint hurts.  Your feet or hands hurt or swell.  You feel tired (fatigued). Get help right away if:  You have symptoms of infection. These include: ? Fever. ? Chills. ? Being very tired. ? Irritability. ? Poor eating. ? Throwing up (vomiting).  You feel dizzy or faint.  You have new stomach pain, especially on the left side.  You have a an erection (priapism) that lasts more than 4 hours.  You have numbness in your arms or legs.  You have a hard time moving your arms or legs.  You have trouble talking.  You have pain that does not go away when you take medicine.  You are short of breath.  You are breathing fast.  You have a long-term cough.  You have pain in your chest.  You have a bad headache.  You have a stiff neck.  Your stomach looks bloated even though you did not eat much.  Your skin is pale.  You suddenly cannot see well. Summary  Sickle cell anemia is a condition where your red blood cells are shaped like sickles.  Follow your doctor's advice on ways to manage pain, food to eat, activities to do, and steps to take for safe travel.  Get medical help right away if you have any signs of infection, such as a fever. This information is not intended to replace advice given to you by   your health care provider. Make sure you discuss any questions you have with your health care provider. Document Revised: 03/03/2020 Document Reviewed: 03/03/2020 Elsevier Patient Education  2021 Elsevier Inc.  
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
## 2021-02-15 ENCOUNTER — Telehealth: Payer: Self-pay | Admitting: Clinical

## 2021-02-15 ENCOUNTER — Other Ambulatory Visit (HOSPITAL_COMMUNITY): Payer: Self-pay

## 2021-02-15 ENCOUNTER — Other Ambulatory Visit: Payer: Self-pay | Admitting: Nurse Practitioner

## 2021-02-15 MED ORDER — OXYCODONE-ACETAMINOPHEN 10-325 MG PO TABS
1.0000 | ORAL_TABLET | ORAL | 0 refills | Status: DC | PRN
  Filled 2021-02-15: qty 60, 10d supply, fill #0

## 2021-02-15 MED ORDER — IBUPROFEN 800 MG PO TABS
ORAL_TABLET | Freq: Three times a day (TID) | ORAL | 0 refills | Status: DC
  Filled 2021-02-15: qty 30, 10d supply, fill #0

## 2021-02-15 NOTE — Telephone Encounter (Signed)
[facility] General Follow Up Note  02/15/2021 Name: Scott Norton    MRN: [id number]       DOB: January 15, 1966 Scott Norton is a 55 y.o. year old male who sees Barbette Merino, NP for primary care. LCSW was initially consulted to assist the patient with housing and transportation.  Interpreter: No. Interpreter Name & Language: none  Assessment: Patient experiencing transportation and housing barriers.Patient is currently homeless. He stays in hotels when he can. He receives [employer] income. He uses the bus for transportation.  Ongoing Intervention: Today CSW called patient to follow up after hospital discharge yesterday. Referred patient to [facility] and [facility]) for additional assistance with housing resources and other community support. Patient consented to this referral.  CSW called referral in to social worker at [facility]. She indicated they are already familiar with the patient and will reach out to him again regarding the need for housing.  Review of patient status, including review of consultants reports, relevant laboratory and other test results, and collaboration with appropriate care team members and the patient's provider was performed as part of comprehensive patient evaluation and provision of services.     Follow up Plan: 1. CSW available as needed from clinic  Abigail Butts, LCSW [facility] [phone]

## 2021-02-15 NOTE — Progress Notes (Signed)
   [facility] Patient [facility] [address] [place], [place]  [address] Phone:  [phone]   Fax:  [phone] PDMP not reviewed this encounter.Subjective:    Edith Lord calls for refill of his Percocet 10/325 mg    Objective:   Indication for chronic opioid: CPS related to SCD Medication and dose: Percocet 10/325 mg # pills per month: 120 Last UDS date: 02/08/2021    Opioid Treatment Agreement signed (Y/N): y Opioid Treatment Agreement last reviewed with patient:  2022 NCCSRS reviewed this encounter (include red flags):  n   Assessment:   Chronic Pain Syndrome related to Sickle Cell Disease    Plan:    Return for follow-up appointment as scheduled.

## 2021-02-16 ENCOUNTER — Other Ambulatory Visit (HOSPITAL_COMMUNITY): Payer: Self-pay

## 2021-02-27 ENCOUNTER — Emergency Department (HOSPITAL_COMMUNITY)
Admission: EM | Admit: 2021-02-27 | Discharge: 2021-02-28 | Disposition: A | Discharge status: Home / Self Care | Payer: Medicare Other | Attending: Emergency Medicine | Admitting: Emergency Medicine

## 2021-02-27 ENCOUNTER — Encounter (HOSPITAL_COMMUNITY): Payer: Self-pay

## 2021-02-27 ENCOUNTER — Other Ambulatory Visit: Payer: Self-pay

## 2021-02-27 DIAGNOSIS — M549 Dorsalgia, unspecified: Secondary | ICD-10-CM | POA: Insufficient documentation

## 2021-02-27 DIAGNOSIS — F17290 Nicotine dependence, other tobacco product, uncomplicated: Secondary | ICD-10-CM | POA: Insufficient documentation

## 2021-02-27 DIAGNOSIS — F341 Dysthymic disorder: Secondary | ICD-10-CM | POA: Insufficient documentation

## 2021-02-27 DIAGNOSIS — D649 Anemia, unspecified: Secondary | ICD-10-CM | POA: Insufficient documentation

## 2021-02-27 DIAGNOSIS — Z5900 Homelessness unspecified: Secondary | ICD-10-CM | POA: Insufficient documentation

## 2021-02-27 DIAGNOSIS — R45851 Suicidal ideations: Secondary | ICD-10-CM

## 2021-02-27 DIAGNOSIS — Z20822 Contact with and (suspected) exposure to covid-19: Secondary | ICD-10-CM | POA: Insufficient documentation

## 2021-02-27 LAB — RESP PANEL BY RT-PCR (FLU A&B, COVID) ARPGX2
Influenza A by PCR: NEGATIVE
Influenza B by PCR: NEGATIVE
SARS Coronavirus 2 by RT PCR: NEGATIVE

## 2021-02-27 LAB — CBC
HCT: 26.7 % — ABNORMAL LOW (ref 39.0–52.0)
Hemoglobin: 8.7 g/dL — ABNORMAL LOW (ref 13.0–17.0)
MCH: 31.2 pg (ref 26.0–34.0)
MCHC: 32.6 g/dL (ref 30.0–36.0)
MCV: 95.7 fL (ref 80.0–100.0)
Platelets: 247 10*3/uL (ref 150–400)
RBC: 2.79 MIL/uL — ABNORMAL LOW (ref 4.22–5.81)
RDW: 19.6 % — ABNORMAL HIGH (ref 11.5–15.5)
WBC: 7.3 10*3/uL (ref 4.0–10.5)
nRBC: 6.9 % — ABNORMAL HIGH (ref 0.0–0.2)

## 2021-02-27 LAB — COMPREHENSIVE METABOLIC PANEL
ALT: 13 U/L (ref 0–44)
AST: 27 U/L (ref 15–41)
Albumin: 3.8 g/dL (ref 3.5–5.0)
Alkaline Phosphatase: 193 U/L — ABNORMAL HIGH (ref 38–126)
Anion gap: 5 (ref 5–15)
BUN: 14 mg/dL (ref 6–20)
CO2: 25 mmol/L (ref 22–32)
Calcium: 9.1 mg/dL (ref 8.9–10.3)
Chloride: 108 mmol/L (ref 98–111)
Creatinine, Ser: 1.39 mg/dL — ABNORMAL HIGH (ref 0.61–1.24)
GFR, Estimated: >60 mL/min (ref >60)
Glucose, Bld: 104 mg/dL — ABNORMAL HIGH (ref 70–99)
Potassium: 3.5 mmol/L (ref 3.5–5.1)
Sodium: 138 mmol/L (ref 135–145)
Total Bilirubin: 1.8 mg/dL — ABNORMAL HIGH (ref 0.3–1.2)
Total Protein: 6.9 g/dL (ref 6.5–8.1)

## 2021-02-27 LAB — RAPID URINE DRUG SCREEN, HOSP PERFORMED
Amphetamines: NOT DETECTED
Barbiturates: NOT DETECTED
Benzodiazepines: NOT DETECTED
Cocaine: POSITIVE — AB
Opiates: NOT DETECTED
Tetrahydrocannabinol: NOT DETECTED

## 2021-02-27 LAB — ACETAMINOPHEN LEVEL: Acetaminophen (Tylenol), Serum: <10 ug/mL — ABNORMAL LOW (ref 10–30)

## 2021-02-27 LAB — SALICYLATE LEVEL: Salicylate Lvl: <7 mg/dL — ABNORMAL LOW (ref 7.0–30.0)

## 2021-02-27 LAB — ETHANOL: Alcohol, Ethyl (B): <10 mg/dL (ref <10)

## 2021-02-27 MED ORDER — ACETAMINOPHEN 500 MG PO TABS
1000.0000 mg | ORAL_TABLET | Freq: Once | ORAL | Status: AC
  Administered 2021-02-27: 1000 mg via ORAL
  Filled 2021-02-27: qty 2

## 2021-02-27 NOTE — ED Provider Notes (Signed)
Emergency Medicine Provider Triage Evaluation Note  Scott Norton , a 55 y.o. male  was evaluated in triage.  Pt complains of generalized body aches, homelessness, suicidal thoughts.  Patient states he has a history of sickle cell and has generalized body aches today.  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  He has not taken anything including Tylenol, ibuprofen, or pain medicine.  No fall, trauma, or injury.  No focal pain, it is all the same throughout his entire body.  He denies fevers, chills, chest pain, shortness of breath, cough, nausea, vomiting, urinary symptoms, normal bowel movements.  Patient states he had to leave his hotel today, and he is homeless.  He states he is having suicidal thoughts, thinking of jumping off.  He has not taken or done anything today and attempt to harm himself.  He is not on medicine for mental health..  Review of Systems  Positive: Body aches, SI Negative: Fever, cp, cough  Physical Exam  BP 119/68 (BP Location: Left Arm)   Pulse 66   Temp 98.4 F (36.9 C) (Oral)   Resp 18   Ht 6\' 3"  (1.905 m)   Wt 75.1 kg   SpO2 94%   BMI 20.69 kg/m  Gen:   Awake, no distress   Resp:  Normal effort  MSK:   Moves extremities without difficulty    Medical Decision Making  Medically screening exam initiated at 7:28 PM.  Appropriate orders placed.  Hartwell Vandiver was informed that the remainder of the evaluation will be completed by another provider, this initial triage assessment does not replace that evaluation, and the importance of remaining in the ED until their evaluation is complete.  Labs ordered, tts consult placed   [name], PA-C 02/27/21 1930    04/29/21, MD 02/27/21 [phone]

## 2021-02-27 NOTE — ED Provider Notes (Signed)
[facility]-EMERGENCY DEPT Provider Note   CSN: [id number] Arrival date & time: 02/27/21  1816     History Chief Complaint  Patient presents with  . Back Pain  . generalized body pain  . Suicidal    Scott Norton is a 55 y.o. male presenting for evaluation of generalized body aches, suicidal thoughts, homelessness.  Patient states he has had generalized body aches today.  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  He has been doing increased physical activity, and this is flared it up.  He has not taken anything including Tylenol, ibuprofen, pain medicine.  He denies fevers, chills, chest pain, shortness of breath, cough, nausea, vomiting, abdominal pain, urinary symptoms, normal bowel movements. Additionally, he states he is homeless but he was told by his parole officer that he needs to have an address by 7 PM tonight.  Between his pain and his homelessness, patient is now feeling suicidal.  He states he is thinking about jumping off a bridge.  He has not attempted any self-harm today through physical ask for ingestion.  He is not on any medication for mental health.  HPI     Past Medical History:  Diagnosis Date  . AKI (acute kidney injury) (HCC) 02/08/2021  . Anemia   . Depression   . Heart murmur   . Pneumonia    ?ACS requring intubation  . Seasonal allergies    wool & grass  . Sickle cell anemia [facility])     Patient Active Problem List   Diagnosis Date Noted  . Sickle-cell crisis (HCC) 02/08/2021  . Hyperkalemia 02/08/2021  . Thrombocytopenia (HCC) 02/08/2021  . Leukocytosis 02/08/2021  . AKI (acute kidney injury) (HCC) 02/08/2021  . Cocaine abuse (HCC) 02/08/2021  . Homelessness 09/14/2020  . Verbalizes suicidal thoughts 09/04/2020  . Tobacco use 09/04/2020  . Sickle cell pain crisis (HCC) 04/11/2015  . Vitamin D deficiency 01/31/2015  . Chronic pain 06/30/2014  . Respiratory infection 08/13/2012  . Healthcare maintenance  06/04/2012  . Cervical strain, acute 12/09/2011  . Neck sprain and strain 12/09/2011  . Sickle cell anemia (HCC) 11/29/2011  . Seasonal allergies 11/29/2011  . Seasonal allergic rhinitis 11/29/2011    Past Surgical History:  Procedure Laterality Date  . ROOT CANAL         Family History  Problem Relation Age of Onset  . Alcohol abuse Mother   . Sickle cell trait Mother   . Hypertension Mother   . Alcohol abuse Father   . Sickle cell trait Father   . Early death Father        due to alcoholism  . Hypertension Father   . Hypertension Sister   . Hypertension Brother   . Hypertension Maternal Grandmother   . Hypertension Maternal Grandfather   . Hypertension Paternal Grandmother   . Hypertension Paternal Grandfather     Social History   Tobacco Use  . Smoking status: Current Every Day Smoker    Packs/day: 0.50    Types: Cigars  . Smokeless tobacco: Never Used  Vaping Use  . Vaping Use: Never used  Substance Use Topics  . Alcohol use: Yes    Comment: occasional but indicated etoh abuse on health hx questionaire  . Drug use: Yes    Types: Marijuana    Home Medications Prior to Admission medications   Medication Sig Start Date End Date Taking? Authorizing Provider  ergocalciferol (VITAMIN D2) 1.25 MG (50000 UT) capsule Take 50,000  Units by mouth every 30 (thirty) days.  Patient not taking: Reported on 02/08/2021 07/07/20 07/07/21  [provider]  ibuprofen (ADVIL) 800 MG tablet TAKE 1 TABLET BY MOUTH THREE TIMES DAILY 02/15/21 02/15/22  Barbette Merino, NP  oxyCODONE-acetaminophen (PERCOCET) 10-325 MG tablet Take 1 tablet by mouth every 4 (four) hours as needed for up to 15 days for pain. 02/15/21 03/02/21  Barbette Merino, NP    Allergies    Aspirin and Tramadol  Review of Systems   Review of Systems  Musculoskeletal: Positive for myalgias.  Psychiatric/Behavioral: Positive for suicidal ideas.  All other systems reviewed and are negative.   Physical  Exam Updated Vital Signs BP 119/68 (BP Location: Left Arm)   Pulse 66   Temp 98.4 F (36.9 C) (Oral)   Resp 18   Ht 6\' 3"  (1.905 m)   Wt 75.1 kg   SpO2 94%   BMI 20.69 kg/m   Physical Exam Vitals and nursing note reviewed.  Constitutional:      General: He is not in acute distress.    Appearance: He is well-developed.     Comments: Appears nontoxic, resting comfortably in the bed  HENT:     Head: Normocephalic and atraumatic.  Eyes:     Conjunctiva/sclera: Conjunctivae normal.     Pupils: Pupils are equal, round, and reactive to light.  Cardiovascular:     Rate and Rhythm: Normal rate and regular rhythm.     Pulses: Normal pulses.  Pulmonary:     Effort: Pulmonary effort is normal. No respiratory distress.     Breath sounds: Normal breath sounds. No wheezing.  Abdominal:     General: There is no distension.     Palpations: Abdomen is soft. There is no mass.     Tenderness: There is no abdominal tenderness. There is no guarding or rebound.  Musculoskeletal:        General: Normal range of motion.     Cervical back: Normal range of motion and neck supple.  Skin:    General: Skin is warm and dry.     Capillary Refill: Capillary refill takes less than 2 seconds.  Neurological:     Mental Status: He is alert and oriented to person, place, and time.  Psychiatric:        Attention and Perception: He does not perceive auditory or visual hallucinations.        Mood and Affect: Mood normal.        Speech: Speech normal.        Behavior: Behavior normal.        Thought Content: Thought content includes suicidal ideation. Thought content does not include homicidal ideation. Thought content includes suicidal plan. Thought content does not include homicidal plan.     ED Results / Procedures / Treatments   Labs (all labs ordered are listed, but only abnormal results are displayed) Labs Reviewed  COMPREHENSIVE METABOLIC PANEL - Abnormal; Notable for the following components:       Result Value   Glucose, Bld 104 (*)    Creatinine, Ser 1.39 (*)    Alkaline Phosphatase 193 (*)    Total Bilirubin 1.8 (*)    All other components within normal limits  CBC - Abnormal; Notable for the following components:   RBC 2.79 (*)    Hemoglobin 8.7 (*)    HCT 26.7 (*)    RDW 19.6 (*)    nRBC 6.9 (*)    All other components within  normal limits  RESP PANEL BY RT-PCR (FLU A&B, COVID) ARPGX2  ETHANOL  SALICYLATE LEVEL  ACETAMINOPHEN LEVEL  RAPID URINE DRUG SCREEN, HOSP PERFORMED    EKG None  Radiology No results found.  Procedures Procedures   Medications Ordered in ED Medications  acetaminophen (TYLENOL) tablet 1,000 mg (1,000 mg Oral Given 02/27/21 1952)    ED Course  I have reviewed the triage vital signs and the nursing notes.  Pertinent labs & imaging results that were available during my care of the patient were reviewed by me and considered in my medical decision making (see chart for details).    MDM Rules/Calculators/A&P                          Patient presented for evaluation of generalized body aches, homelessness, suicidal thoughts.  On exam, patient appears nontoxic.  He is resting comfortably in the bed.  Does not appear to be in pain and states his pain is from overexerting himself today.  Will treat symptomatically.  Will obtain basic labs to ensure no obvious abnormality.  Additionally, he is reporting suicidal thoughts, which she states are new/worse today.  As such, will consult with psych, although it does appear his suicidal thoughts are likely due to his homelessness and social situation.  Labs show chronic, but stable, anemia.  Otherwise reassuring.  Slight elevation in function from baseline, likely due to dehydration.  Will encourage p.o. fluids. Pt is medically cleared for psych eval.  The patient has been placed in psychiatric observation due to the need to provide a safe environment for the patient while obtaining psychiatric  consultation and evaluation, as well as ongoing medical and medication management to treat the patient's condition.  The patient has not been placed under full IVC at this time.  Final Clinical Impression(s) / ED Diagnoses Final diagnoses:  None    Rx / DC Orders ED Discharge Orders    None       Alveria Apley, PA-C 02/27/21 2007    Charlynne Pander, MD 02/27/21 2326

## 2021-02-27 NOTE — ED Triage Notes (Signed)
Patient states he is suicidal and "every time I walk across a bridge I think about jumping. I am on probation and I have until 7 tonight to get an address. I don't want to be here any longer. I have no money and I am homeless."

## 2021-02-28 NOTE — BH Assessment (Signed)
Comprehensive Clinical Assessment (CCA) Note  02/28/2021 Scott Norton [id number]   Disposition: Liborio Nixon, NP recommends inpatient treatment and is not medically cleared. Day shift AC to review for possible placement once pt is medically cleared. Disposition discussed with Reita Cliche, RN. RN to discuss with EDP.   Flowsheet Row ED from 02/27/2021 in [facility] to Hosp-Admission (Discharged) from 02/08/2021 in [facility] from 02/07/2021 in [facility]  C-SSRS RISK CATEGORY Moderate Risk No Risk No Risk      The patient demonstrates the following risk factors for suicide: Chronic risk factors for suicide include: psychiatric disorder of Major Depressive Disorder and medical illness Sickle Cell Anemia. Acute risk factors for suicide include: Pt has no supports. Protective factors for this patient include: None. Considering these factors, the overall suicide risk at this point appears to be high. Patient is appropriate for outpatient follow up.  Meet Scott Norton is a 55 year old male who presents voluntary and unaccompanied to [facility]. Clinician asked the pt, "what brought you to the hospital?" Pt reported, he was having body aches. Per pt, he hospitalized for two weeks due to low hemoglobin. Pt reported, his parole officer is a major stressor; she tries to send a warrant for his arrest. Pt reported, when he couldn't make their appointment because he had a follow up doctor's appointment (for when he was hospitalized). Pt reported,he got out of jail on February 21 he got a letter to follow up on February 28 but his parole officer told him he needed to be in court on February 24. Pt reported, his parole officer barks on people but doesn't help. Pt reported, he was with a friend who made a drug deal to an undercover cop, but his friend gave him the drugs to give to the cop and now has nine charges. Pt reported, he called a friend  to bring him his gun (9mm). Pt reported, he feels nothing has changed. Pt reported, he had thought about jumping off a bridge or using his gun. Pt denies, HI, AVH, self-injurious behaviors.   Pt reported, he crushes the cocaine and puts it in a cigar, a couple days ago. Pt reported, he used cocaine everyday last week. Pt reported, drinking last week. Pt's UDS is positive for cocaine. Pt denies, being linked to OPT resources (medication management and/or counseling.) Pt has previous inpatient admissions.   Pt presents quiet, awake in scrubs with normal speech. Pt's mood, affect was depressed. Pt's thought content was appropriate to mood. Pt's insight was fair. Pt's judgement was poor. Pt reported, if discharge he will most likely hurt himself. Clinician asked the pt would he jump off a bridge or use his gun, pt responded, "don't know at moment."  Diagnosis: Major Depressive Disorder, recurrent, severe without psychotic features.   *Pt declined for clinician to contact supports. Pt denies, having, friend and family supports.*  Chief Complaint:  Chief Complaint  Patient presents with  . Back Pain  . generalized body pain  . Suicidal   Visit Diagnosis:     CCA Screening, Triage and Referral (STR)  Patient Reported Information How did you hear about [place]? Self  Referral name: No data recorded Referral phone number: No data recorded  Whom do you see for routine medical problems? I don't have a doctor  [facility]Facility [facility]: [facility]  Practice/Facility Phone Number: No data recorded Name of Contact: No data recorded Contact Number: No data recorded Contact Fax  Number: No data recorded Prescriber Name: No data recorded Prescriber Address (if known): No data recorded  What Is the Reason for Your Visit/Call Today? homeless, sickle cell pain, depression  How Long Has This Been Causing You Problems? > than 6 months  What Do You Feel Would Help You the Most Today? Assessment  Only   Have You Recently Been in Any Inpatient Treatment (Hospital/Detox/Crisis Center/28-Day Program)? Yes  [facility]Location [facility]  How Long Were You There? 9 days  When Were You Discharged? No data recorded  Have You Ever Received Services From [facility] Before? No  Who Do You See at [facility]? No data recorded  Have You Recently Had Any Thoughts About Hurting Yourself? Yes  Are You Planning to Commit Suicide/Harm Yourself At This time? No   Have you Recently Had Thoughts About Hurting Someone [name]? Yes  Explanation: No data recorded  Have You Used Any Alcohol or Drugs in the Past 24 Hours? Yes  How Long Ago Did You Use Drugs or Alcohol? No data recorded What Did You Use and How Much? 1 pint alcohol   Do You Currently Have a Therapist/Psychiatrist? No  Name of Therapist/Psychiatrist: No data recorded  Have You Been Recently Discharged From Any Office Practice or Programs? No  Explanation of Discharge From Practice/Program: No data recorded    CCA Screening Triage Referral Assessment Type of Contact: Tele-Assessment  Is this Initial or Reassessment? No data recorded Date Telepsych consult ordered in [facility]:  09/28/2020  Time Telepsych consult ordered in [facility]:  1824   Patient Reported Information Reviewed? Yes  Patient Left Without Being Seen? No data recorded Reason for Not Completing Assessment: No data recorded  Collateral Involvement: none   Does Patient Have a Court Appointed Legal Guardian? No data recorded Name and Contact of Legal Guardian: Self.   If Minor and Not Living with Parent(s), Who has Custody? No data recorded Is CPS involved or ever been involved? Never  Is APS involved or ever been involved? Never   Patient Determined To Be At Risk for Harm To Self or Others Based on Review of Patient Reported Information or Presenting Complaint? No  Method: No data recorded Availability of Means: No data recorded Intent:  No data recorded Notification Required: No data recorded Additional Information for Danger to Others Potential: No data recorded Additional Comments for Danger to Others Potential: No data recorded Are There Guns or Other Weapons in Your Home? No data recorded Types of Guns/Weapons: No data recorded Are These Weapons Safely Secured?                            No data recorded Who Could Verify You Are Able To Have These Secured: No data recorded Do You Have any Outstanding Charges, Pending Court Dates, Parole/Probation? No data recorded Contacted To Inform of Risk of Harm To Self or Others: No data recorded  [facility]: [facility]   Does Patient Present under Involuntary Commitment? No  IVC Papers Initial File Date: No data recorded  [place] of Residence: [place]   Patient Currently Receiving the Following Services: Not Receiving Services   Determination of Need: Routine (7 days)   Options For Referral: Other: Comment     CCA Biopsychosocial Intake/Chief Complaint:  Per EDP/PA note: "male presenting for evaluation of generalized body aches, suicidal thoughts, homelessness. Patient states he has had generalized body aches today. 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 He has been doing increased physical activity, and this is flared it up. He has not taken anything including Tylenol, ibuprofen, pain medicine. He denies fevers, chills, chest pain, shortness of breath, cough, nausea, vomiting, abdominal pain, urinary symptoms, normal bowel movements. Additionally, he states he is homeless but he was told by his parole officer that he needs to have an address by 7 PM tonight. Between his pain and his homelessness, patient is now feeling suicidal. He states he is thinking about jumping off a bridge. He has not attempted any self-harm today through physical ask for ingestion. He is not on any medication for mental health."  Current Symptoms/Problems: Suicidal  ideation with plan, depressive symptoms.   Patient Reported Schizophrenia/Schizoaffective Diagnosis in Past: No   Strengths: Not assessed.  Preferences: Not assessed.  Abilities: Not assessed.   Type of Services Patient Feels are Needed: Pt reported, if discharged he can't contract for safety if discharge.   Initial Clinical Notes/Concerns: NA   Mental Health Symptoms Depression:  Hopelessness; Irritability; Sleep (too much or little); Worthlessness; Fatigue   Duration of Depressive symptoms: Greater than two weeks   Mania:  None   Anxiety:   Worrying   Psychosis:  None   Duration of Psychotic symptoms: No data recorded  Trauma:  None   Obsessions:  None   Compulsions:  None   Inattention:  None   Hyperactivity/Impulsivity:  N/A   Oppositional/Defiant Behaviors:  Argumentative   Emotional Irregularity:  N/A   Other Mood/Personality Symptoms:  No data recorded   Mental Status Exam Appearance and self-care  Stature:  Average   Weight:  Average weight   Clothing:  -- (Pt in scrubs.)   Grooming:  Neglected   Cosmetic use:  None   Posture/gait:  Normal   Motor activity:  Not Remarkable   Sensorium  Attention:  Normal   Concentration:  Normal   Orientation:  X5   Recall/memory:  Normal   Affect and Mood  Affect:  Depressed   Mood:  Depressed   Relating  Eye contact:  Normal   Facial expression:  Responsive   Attitude toward examiner:  Cooperative   Thought and Language  Speech flow: Normal   Thought content:  Appropriate to Mood and Circumstances   Preoccupation:  None   Hallucinations:  None   Organization:  No data recorded  [employer] of Knowledge:  Fair   Intelligence:  Average   Abstraction:  Normal   Judgement:  Poor   Reality Testing:  Realistic   Insight:  Fair   Decision Making:  Normal   Social Functioning  Social Maturity:  Responsible   Social Judgement:  Normal   Stress  Stressors:   Housing; Optometrist Ability:  Exhausted   Skill Deficits:  Activities of daily living   Supports:  Support needed     Religion: Religion/Spirituality Are You A Religious Person?: No  Leisure/Recreation:    Exercise/Diet: Exercise/Diet Do You Have Any Trouble Sleeping?: Yes Explanation of Sleeping Difficulties: Pt reported, his sleep was up and down.   CCA Employment/Education Employment/Work Situation: Employment / Work Situation Employment situation: On disability Why is patient on disability: Sickle Cell Anemia.. What is the longest time patient has a held a job?: Not assessed. Where was the patient employed at that time?: Not assessed. Has patient ever been in the [employer]?: No  Education: Education Is Patient Currently Attending School?: No Did Garment/textile technologist From  High School?: Yes (Pt got his GED.)   CCA Family/Childhood History Family and Relationship History: Family history Marital status: Single What is your sexual orientation?: Not assessed. Has your sexual activity been affected by drugs, alcohol, medication, or emotional stress?: Not assessed. Does patient have children?: Yes How many children?: 2  Childhood History:  Childhood History Additional childhood history information: Not assessed. Description of patient's relationship with caregiver when they were a child: Not assessed. Patient's description of current relationship with people who raised him/her: Not assessed. How were you disciplined when you got in trouble as a child/adolescent?: Not assessed. Does patient have siblings?: Yes Number of Siblings: 8 Did patient suffer any verbal/emotional/physical/sexual abuse as a child?: No Has patient ever been sexually abused/assaulted/raped as an adolescent or adult?: No Was the patient ever a victim of a crime or a disaster?: No  Child/Adolescent Assessment:     CCA Substance Use Alcohol/Drug Use: Alcohol / Drug Use Pain Medications: See  MAR Prescriptions: See MAR Over the Counter: See MAR History of alcohol / drug use?: Yes Substance #1 Name of Substance 1: Cocaine. 1 - Age of First Use: UTA 1 - Amount (size/oz): Pt reported, he crushes the cocaine and puts it in a cigar, a couple days ago. 1 - Frequency: Pt reported, everyday last week. 1 - Duration: Ongoing. 1 - Last Use / Amount: Per pt, "not much." 1 - Method of Aquiring: UTA 1- Route of Use: Smoke. Substance #2 Name of Substance 2: Alcohol. 2 - Age of First Use: UTA 2 - Amount (size/oz): Pt reported, drinking last week. 2 - Frequency: Pt reported, "not so much." 2 - Duration: Ongoing. 2 - Last Use / Amount: Pt reported, last week. 2 - Method of Aquiring: UTA 2 - Route of Substance Use: Oral.     ASAM's:  Six Dimensions of Multidimensional Assessment  Dimension 1:  Acute Intoxication and/or Withdrawal Potential:      Dimension 2:  Biomedical Conditions and Complications:   Dimension 2:  Description of patient's biomedical conditions and  complications: Pt has Sickle Cell Anemia.  Dimension 3:  Emotional, Behavioral, or Cognitive Conditions and Complications:  Dimension 3:  Description of emotional, behavioral, or cognitive conditions and complications: Major Depressive Disorder.  Dimension 4:  Readiness to Change:     Dimension 5:  Relapse, Continued use, or Continued Problem Potential:  Dimension 5:  Relapse, continued use, or continued problem potential critiera description: Pt is homeless and lives in hotels.  Dimension 6:  Recovery/Living Environment:  Dimension 6:  Recovery/Iiving environment criteria description: Pt denies, supports and is currently homeless.  ASAM Severity Score:    ASAM Recommended Level of Treatment: ASAM Recommended Level of Treatment: Level II Intensive Outpatient Treatment   Substance use Disorder (SUD) Substance Use Disorder (SUD)  Checklist Symptoms of Substance Use: Continued use despite having a persistent/recurrent  physical/psychological problem caused/exacerbated by use  Recommendations for Services/Supports/Treatments: Recommendations for Services/Supports/Treatments Recommendations For Services/Supports/Treatments: Inpatient Hospitalization  DSM5 Diagnoses: Patient Active Problem List   Diagnosis Date Noted  . Sickle-cell crisis (HCC) 02/08/2021  . Hyperkalemia 02/08/2021  . Thrombocytopenia (HCC) 02/08/2021  . Leukocytosis 02/08/2021  . AKI (acute kidney injury) (HCC) 02/08/2021  . Cocaine abuse (HCC) 02/08/2021  . Homelessness 09/14/2020  . Verbalizes suicidal thoughts 09/04/2020  . Tobacco use 09/04/2020  . Sickle cell pain crisis (HCC) 04/11/2015  . Vitamin D deficiency 01/31/2015  . Chronic pain 06/30/2014  . Respiratory infection 08/13/2012  . Healthcare maintenance 06/04/2012  .  Cervical strain, acute 12/09/2011  . Neck sprain and strain 12/09/2011  . Sickle cell anemia (HCC) 11/29/2011  . Seasonal allergies 11/29/2011  . Seasonal allergic rhinitis 11/29/2011    Referrals to Alternative Service(s): Referred to Alternative Service(s):   Place:   Date:   Time:    Referred to Alternative Service(s):   Place:   Date:   Time:    Referred to Alternative Service(s):   Place:   Date:   Time:    Referred to Alternative Service(s):   Place:   Date:   Time:     Redmond Pullingreylese D Rella Egelston, [facility]  Comprehensive Clinical Assessment (CCA) Screening, Triage and Referral Note  02/28/2021 Scott CruelKevin Jasperson [id number]  Chief Complaint:  Chief Complaint  Patient presents with  . Back Pain  . generalized body pain  . Suicidal   Visit Diagnosis:   Patient Reported Information How did you hear about us? Self   Referral name: No data recorded  Referral phone number: No data recorded Whom do you see for routine medical problems? I don't have a doctor   [facility]   Practice/Facility Phone Number: No data recorded  Name of Contact: No data recorded  Contact Number: No  data recorded  Contact Fax Number: No data recorded  Prescriber Name: No data recorded  Prescriber Address (if known): No data recorded What Is the Reason for Your Visit/Call Today? homeless, sickle cell pain, depression  How Long Has This Been Causing You Problems? > than 6 months  Have You Recently Been in Any Inpatient Treatment (Hospital/Detox/Crisis Center/28-Day Program)? Yes   [facility]Location [facility]   How Long Were You There? 9 days   When Were You Discharged? No data recorded [facility]? [facility]   [facility]? No data recorded Have You Recently Had Any Thoughts About Hurting Yourself? Yes   Are You Planning to Commit Suicide/Harm Yourself At This time?  No  Have you Recently Had Thoughts About Hurting Someone Karolee Ohslse? Yes   Explanation: No data recorded Have You Used Any Alcohol or Drugs in the Past 24 Hours? Yes   How Long Ago Did You Use Drugs or Alcohol?  No data recorded  What Did You Use and How Much? 1 pint alcohol  What Do You Feel Would Help You the Most Today? Assessment Only  Do You Currently Have a Therapist/Psychiatrist? No   Name of Therapist/Psychiatrist: No data recorded  Have You Been Recently Discharged From Any Office Practice or Programs? No   Explanation of Discharge From Practice/Program:  No data recorded    CCA Screening Triage Referral Assessment Type of Contact: Tele-Assessment   Is this Initial or Reassessment? No data recorded  Date Telepsych consult ordered in [facility]:  09/28/2020   [facility]  Patient Reported Information Reviewed? Yes   Patient Left Without Being Seen? No data recorded  Reason for Not Completing Assessment: No data recorded Collateral Involvement: none  Does Patient Have a Court Appointed Legal Guardian? No data recorded  Name and Contact of Legal Guardian:  Self.   If Minor and Not Living with  Parent(s), Who has Custody? No data recorded Is CPS involved or ever been involved? Never  Is APS involved or ever been involved? Never  Patient Determined To Be At Risk for Harm To Self or Others Based on Review of Patient Reported Information or Presenting Complaint? No  Method: No data recorded  Availability of Means: No data recorded  Intent: No data recorded  Notification Required: No data recorded  Additional Information for Danger to Others Potential:  No data recorded  Additional Comments for Danger to Others Potential:  No data recorded  Are There Guns or Other Weapons in Your Home?  No data recorded   Types of Guns/Weapons: No data recorded   Are These Weapons Safely Secured?                              No data recorded   Who Could Verify You Are Able To Have These Secured:    No data recorded Do You Have any Outstanding Charges, Pending Court Dates, Parole/Probation? No data recorded Contacted To Inform of Risk of Harm To Self or Others: No data recorded [facility]  Does Patient Present under Involuntary Commitment? No   IVC Papers Initial File Date: No data recorded  [place] of Residence: [place]  Patient Currently Receiving the Following Services: Not Receiving Services   Determination of Need: Routine (7 days)   Options For Referral: Other: Comment   Redmond Pulling, [facility]     Redmond Pulling, MS, [facility], [facility] Triage Specialist [phone]

## 2021-02-28 NOTE — BH Assessment (Signed)
[facility] Assessment Progress Note  Per [name], NP, this voluntary pt will only be safe for discharge if placement can be found in a residential substance abuse treatment program.  First contact was with [facility], who required a telephone interview with pt.  During interview pt divulged to them that he has a sex offense on his record, which was exclusionary.  I then contacted the following programs:  [facility] - currently relocating; will re-open after 03/22/2021 [facility] [employer] - sex offense is exclusionary Writer - school across the street, making sex offense exclusionary [employer] - no exclusions, but no beds available [facility] - sex offense is exclusionary; also not on [address] - sex offense is exclusionary [facility] for overnight holding - sex offense is exclusionary  I staffed these details with Eber Jones and it was decided to submit pt to [place], Charity fundraiser. AC at [facility] to consider pt for admission.  This has been done with decision pending as of this writing.  EDP Kristine Royal and pt's nurse, Jonny Ruiz, have been notified.  Doylene Canning, [place] Behavioral Health Coordinator [phone]

## 2021-02-28 NOTE — ED Notes (Signed)
Report given to [facility] at [facility]. Patient transported by safe transport.

## 2021-02-28 NOTE — ED Notes (Signed)
Call from [facility] pt meets inpatient criteria will review for placement this AM EDP [place] notified.

## 2021-02-28 NOTE — Consult Note (Signed)
[place] Face-to-Face Psychiatry Consult   Reason for Consult:  Psychiatric evaluation for suicidal ideations  Referring Physician:  Dr. Estell Harpin Patient Identification: Scott Norton MRN:  [id number] Principal Diagnosis: Suicidal ideations Diagnosis:  Principal Problem:   Suicidal ideations   Total Time spent with patient: 30 minutes  Subjective:   Scott Norton is a 55 y.o. male patient that presented to [facility] unaccompanied and voluntarily.   [name], 55 y.o., male patient seen via tele health by this provider, consulted with Dr. Lucianne Muss; and chart reviewed on 02/28/21.    HPI:    During evaluation Scott Norton is in sitting position eating his lunch in no acute distress.  He is alert, oriented x 4, calm and cooperative. He reports his mood as depressed with congruent affect. States he is tired of always having pain and dealing with his sickle cell disease. He does not appear to be responding to internal/external stimuli or delusional thoughts. Reports he does not sleep or eat well. He has an ankle monitor on his left ankle. Patient denies homicidal ideation, psychosis, and paranoia. Patient endorses suicidal ideations. States, "my mental health is just not where it needs to be".  States if he were to be discharged he would walk in front of a truck and end his life. States he has a fire arm as well but it is with a friend.  Reports his probation officer has been a contributing factor to his suicidal thoughts. States it has gotten to be too much lately. States she is always putting him down and yelling at him. States, "she doesn't treat me like I am human". Reports after his last stay in the hospital for sickle cell, he didn't call her right away and she had an ankle monitor placed on him.   Pt reported, he crushes the cocaine and puts it in a cigar, a couple days ago. Pt reported, he used cocaine everyday last week. Pt reported, drinking last week. Pt's UDS is positive for cocaine. Pt denies, being  linked to OPT resources (medication management and/or counseling.) Pt has previous inpatient admissions.   Patient reports no protective factors.   Pt declined for clinician to contact supports. Pt denies, having, friend and family supports.  Patient is not a candidate for [employer] due to being a sex offender. 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 AKI (acute kidney injury) (HCC) 02/08/2021  . Anemia   . Depression   . Heart murmur   . Pneumonia    ?ACS requring intubation  . Seasonal allergies    wool & grass  . Sickle cell anemia (HCC)     Past Surgical History:  Procedure Laterality Date  . ROOT CANAL     Family History:  Family History  Problem Relation Age of Onset  . Alcohol abuse Mother   . Sickle cell trait Mother   . Hypertension Mother   . Alcohol abuse Father   . Sickle cell trait Father   . Early death Father        due to alcoholism  . Hypertension Father   . Hypertension Sister   . Hypertension Brother   . Hypertension Maternal Grandmother   .  Hypertension Maternal Grandfather   . Hypertension Paternal Grandmother   . Hypertension Paternal Grandfather    Family Psychiatric  History: unknown Social History:  Social History   Substance and Sexual Activity  Alcohol Use Yes   Comment: occasional but indicated etoh abuse on health hx questionaire     Social History   Substance and Sexual Activity  Drug Use Yes  . Types: Marijuana    Social History   Socioeconomic History  . Marital status: Single    Spouse name: Not on file  . Number of children: Not on file  . Years of education: Not on file  . Highest education level: Not on file   Occupational History  . Not on file  Tobacco Use  . Smoking status: Current Every Day Smoker    Packs/day: 0.50    Types: Cigars  . Smokeless tobacco: Never Used  Vaping Use  . Vaping Use: Never used  Substance and Sexual Activity  . Alcohol use: Yes    Comment: occasional but indicated etoh abuse on health hx questionaire  . Drug use: Yes    Types: Marijuana  . Sexual activity: Yes  Other Topics Concern  . Not on file  Social History Narrative   Lives with Friends in [place]. Working on housing for himself   On disability for his SS disease      No children, no stable relationship   Social Determinants of Corporate investment banker Strain: Not on file  Food Insecurity: Not on file  Transportation Needs: Not on file  Physical Activity: Not on file  Stress: Not on file  Social Connections: Not on file   Additional Social History:    Allergies:   Allergies  Allergen Reactions  . Aspirin Other (See Comments)    Increased heart rate  . Tape Other (See Comments)    rash  . Tramadol Hives and Itching    Labs:  Results for orders placed or performed during the hospital encounter of 02/27/21 (from the past 48 hour(s))  Comprehensive metabolic panel     Status: Abnormal   Collection Time: 02/27/21  7:16 PM  Result Value Ref Range   Sodium 138 135 - 145 mmol/L   Potassium 3.5 3.5 - 5.1 mmol/L   Chloride 108 98 - 111 mmol/L   CO2 25 22 - 32 mmol/L   Glucose, Bld 104 (H) 70 - 99 mg/dL    Comment: Glucose reference range applies only to samples taken after fasting for at least 8 hours.   BUN 14 6 - 20 mg/dL   Creatinine, Ser 6.33 (H) 0.61 - 1.24 mg/dL   Calcium 9.1 8.9 - 35.4 mg/dL   Total Protein 6.9 6.5 - 8.1 g/dL   Albumin 3.8 3.5 - 5.0 g/dL   AST 27 15 - 41 U/L   ALT 13 0 - 44 U/L   Alkaline Phosphatase 193 (H) 38 - 126 U/L   Total Bilirubin 1.8 (H) 0.3 - 1.2 mg/dL   GFR, Estimated >56 >25 mL/min    Comment: (NOTE) Calculated using the CKD-EPI Creatinine  Equation (2021)    Anion gap 5 5 - 15    Comment: Performed at [facility], [address], [place], [place] [address]  Ethanol     Status: None   Collection Time: 02/27/21  7:16 PM  Result Value Ref Range   Alcohol, Ethyl (B) <10 <10 mg/dL    Comment: (NOTE) Lowest detectable limit for serum alcohol is  10 mg/dL.  For medical purposes only. Performed at [facility], [address], [place], [place] [address]   Salicylate level     Status: Abnormal   Collection Time: 02/27/21  7:16 PM  Result Value Ref Range   Salicylate Lvl <7.0 (L) 7.0 - 30.0 mg/dL    Comment: Performed at [facility], [address], [place], [place] [address]  Acetaminophen level     Status: Abnormal   Collection Time: 02/27/21  7:16 PM  Result Value Ref Range   Acetaminophen (Tylenol), Serum <10 (L) 10 - 30 ug/mL    Comment: (NOTE) Therapeutic concentrations vary significantly. A range of 10-30 ug/mL  may be an effective concentration for many patients. However, some  are best treated at concentrations outside of this range. Acetaminophen concentrations >150 ug/mL at 4 hours after ingestion  and >50 ug/mL at 12 hours after ingestion are often associated with  toxic reactions.  Performed at [facility], [address] [place], [place] [address]   cbc     Status: Abnormal   Collection Time: 02/27/21  7:16 PM  Result Value Ref Range   WBC 7.3 4.0 - 10.5 K/uL   RBC 2.79 (L) 4.22 - 5.81 MIL/uL   Hemoglobin 8.7 (L) 13.0 - 17.0 g/dL   HCT 45.8 (L) 09.9 - 83.3 %   MCV 95.7 80.0 - 100.0 fL   MCH 31.2 26.0 - 34.0 pg   MCHC 32.6 30.0 - 36.0 g/dL   RDW 82.5 (H) 05.3 - 97.6 %   Platelets 247 150 - 400 K/uL   nRBC 6.9 (H) 0.0 - 0.2 %    Comment: Performed at [facility], [address] [place], [place] [address]  Resp Panel by RT-PCR (Flu A&B, Covid) Nasopharyngeal Swab     Status: None   Collection Time:  02/27/21  7:53 PM   Specimen: Nasopharyngeal Swab; Nasopharyngeal(NP) swabs in vial transport medium  Result Value Ref Range   SARS Coronavirus 2 by RT PCR NEGATIVE NEGATIVE    Comment: (NOTE) SARS-CoV-2 target nucleic acids are NOT DETECTED.  The SARS-CoV-2 RNA is generally detectable in upper respiratory specimens during the acute phase of infection. The lowest concentration of SARS-CoV-2 viral copies this assay can detect is 138 copies/mL. A negative result does not preclude SARS-Cov-2 infection and should not be used as the sole basis for treatment or other patient management decisions. A negative result may occur with  improper specimen collection/handling, submission of specimen other than nasopharyngeal swab, presence of viral mutation(s) within the areas targeted by this assay, and inadequate number of viral copies(<138 copies/mL). A negative result must be combined with clinical observations, patient history, and epidemiological information. The expected result is Negative.  Fact Sheet for Patients:  BloggerCourse.com  Fact Sheet for Healthcare Providers:  SeriousBroker.it  This test is no t yet approved or cleared by the [place] FDA and  has been authorized for detection and/or diagnosis of SARS-CoV-2 by FDA under an Emergency Use Authorization (EUA). This EUA will remain  in effect (meaning this test can be used) for the duration of the COVID-19 declaration under Section 564(b)(1) of the Act, 21 U.S.C.section 360bbb-3(b)(1), unless the authorization is terminated  or revoked sooner.       Influenza A by PCR NEGATIVE NEGATIVE   Influenza B by PCR NEGATIVE NEGATIVE    Comment: (NOTE) The Xpert Xpress SARS-CoV-2/FLU/RSV plus assay is intended as an aid in the diagnosis of  influenza from Nasopharyngeal swab specimens and should not be used as a sole basis for treatment. Nasal washings and aspirates are  unacceptable for Xpert Xpress SARS-CoV-2/FLU/RSV testing.  Fact Sheet for Patients: BloggerCourse.comhttps://www.fda.gov/media/152166/download  Fact Sheet for Healthcare Providers: SeriousBroker.ithttps://www.fda.gov/media/152162/download  This test is not yet approved or cleared by the [place] FDA and has been authorized for detection and/or diagnosis of SARS-CoV-2 by FDA under an Emergency Use Authorization (EUA). This EUA will remain in effect (meaning this test can be used) for the duration of the COVID-19 declaration under Section 564(b)(1) of the Act, 21 U.S.C. section 360bbb-3(b)(1), unless the authorization is terminated or revoked.  Performed at [facility], [address] [place], [place] [address]   Rapid urine drug screen (hospital performed)     Status: Abnormal   Collection Time: 02/27/21 10:30 PM  Result Value Ref Range   Opiates NONE DETECTED NONE DETECTED   Cocaine POSITIVE (A) NONE DETECTED   Benzodiazepines NONE DETECTED NONE DETECTED   Amphetamines NONE DETECTED NONE DETECTED   Tetrahydrocannabinol NONE DETECTED NONE DETECTED   Barbiturates NONE DETECTED NONE DETECTED    Comment: (NOTE) DRUG SCREEN FOR MEDICAL PURPOSES ONLY.  IF CONFIRMATION IS NEEDED FOR ANY PURPOSE, NOTIFY LAB WITHIN 5 DAYS.  LOWEST DETECTABLE LIMITS FOR URINE DRUG SCREEN Drug Class                     Cutoff (ng/mL) Amphetamine and metabolites    1000 Barbiturate and metabolites    200 Benzodiazepine                 200 Tricyclics and metabolites     300 Opiates and metabolites        300 Cocaine and metabolites        300 THC                            50 Performed at [facility], [address] [place], [place] [address]     No current facility-administered medications for this encounter.   Current Outpatient Medications  Medication Sig Dispense Refill  . oxyCODONE (ROXICODONE) 15 MG immediate release tablet Take 15 mg by mouth every 4 (four) hours as  needed for pain.    Marland Kitchen. ibuprofen (ADVIL) 800 MG tablet TAKE 1 TABLET BY MOUTH THREE TIMES DAILY (Patient taking differently: Take 800 mg by mouth 3 (three) times daily.) 30 tablet 0  . oxyCODONE-acetaminophen (PERCOCET) 10-325 MG tablet Take 1 tablet by mouth every 4 (four) hours as needed for up to 15 days for pain. (Patient not taking: No sig reported) 60 tablet 0    Musculoskeletal: Strength & Muscle Tone: within normal limits Gait & Station: normal Patient leans: N/A   Psychiatric Specialty Exam:  Presentation  General Appearance: Disheveled  Eye Contact:Good  Speech:Clear and Coherent; Normal Rate  Speech Volume:Normal  Handedness:Right   Mood and Affect  Mood:Depressed  Affect:Congruent; Depressed   Thought Process  Thought Processes:Coherent  Descriptions of Associations:Intact  Orientation:Full (Time, Place and Person)  Thought Content:Logical  History of Schizophrenia/Schizoaffective disorder:No  Duration of Psychotic Symptoms:No data recorded Hallucinations:Hallucinations: None  Ideas of Reference:None  Suicidal Thoughts:Suicidal Thoughts: Yes, Active SI Active Intent and/or Plan: With Intent; With Plan; With Means to Carry Out  Homicidal Thoughts:Homicidal Thoughts: No   Sensorium  Memory:Immediate Good; Recent Good; Remote Good  Judgment:Fair  Insight:Fair   Executive Functions  Concentration:Good  Attention Span:Good  Recall:Good  Fund of Knowledge:Good  Language:Good   Psychomotor Activity  Psychomotor Activity:Psychomotor Activity: Normal   Assets  Assets:Communication Skills; Desire for Improvement; Leisure Time; Physical Health; Resilience   Sleep  Sleep:Sleep: Good Number of Hours of Sleep: 8   Physical Exam: Physical Exam Vitals reviewed.  HENT:     Head: Normocephalic.     Right Ear: Tympanic membrane normal.     Left Ear: Tympanic membrane normal.     Nose: Nose normal.     Mouth/Throat:     Mouth:  Mucous membranes are dry.     Pharynx: Oropharynx is clear.  Eyes:     Conjunctiva/sclera: Conjunctivae normal.  Cardiovascular:     Rate and Rhythm: Normal rate.     Pulses: Normal pulses.  Pulmonary:     Effort: Pulmonary effort is normal.  Abdominal:     Tenderness: There is no guarding.  Musculoskeletal:        General: Normal range of motion.     Cervical back: Normal range of motion.  Skin:    General: Skin is warm and dry.     Capillary Refill: Capillary refill takes less than 2 seconds.  Neurological:     Mental Status: He is alert and oriented to person, place, and time.  Psychiatric:        Attention and Perception: Attention and perception normal.        Mood and Affect: Mood is depressed.        Speech: Speech normal.        Behavior: Behavior normal. Behavior is cooperative.        Thought Content: Thought content includes suicidal ideation.        Cognition and Memory: Cognition normal.        Judgment: Judgment is impulsive.    Review of Systems  Constitutional: Negative.   HENT: Negative.   Eyes: Negative.   Respiratory: Negative.   Cardiovascular: Negative.   Gastrointestinal: Negative.   Genitourinary: Negative.   Musculoskeletal: Negative.   Skin: Negative.   Neurological: Negative.   Endo/Heme/Allergies: Negative.   Psychiatric/Behavioral: Positive for depression and suicidal ideas.   Blood pressure 126/62, pulse 69, temperature 98.1 F (36.7 C), temperature source Oral, resp. rate 20, height 6\' 3"  (1.905 m), weight 75.1 kg, SpO2 96 %. Body mass index is 20.69 kg/m.  Treatment Plan Summary:  Daily contact with patient to assess and evaluate symptoms and progress in treatment and Medication management  Disposition: Recommend psychiatric Inpatient admission when medically cleared.  , NP 02/28/2021 2:08 PM

## 2021-02-28 NOTE — BH Assessment (Addendum)
Per [name], NP, meets criteria for inpatient psychiatric treatment after 10pm. Patient accepted to [facility] (adult unit). The attending provider is Dr. Fayrene Fearing. Bed assignment is 401-2. Nurse report [phone]. Nursing asked to have patient sign voluntary admission forms prior to transfer. Patient's nurse Alvino Chapel, RN), provided disposition updates via secure chat.

## 2021-02-28 NOTE — ED Notes (Signed)
Patient has 1 blue suitcase on rollers, cell phone, charger, and wallet which he will leave in his pants pocket

## 2021-03-01 ENCOUNTER — Inpatient Hospital Stay (HOSPITAL_COMMUNITY)
Admission: AD | Admit: 2021-03-01 | Discharge: 2021-03-06 | DRG: 897 | Disposition: A | Discharge status: Home / Self Care | Payer: Medicare Other | Source: Intra-hospital | Attending: Behavioral Health | Admitting: Behavioral Health

## 2021-03-01 ENCOUNTER — Encounter (HOSPITAL_COMMUNITY): Payer: Self-pay | Admitting: Psychiatry

## 2021-03-01 ENCOUNTER — Ambulatory Visit: Payer: Self-pay | Admitting: Nurse Practitioner

## 2021-03-01 ENCOUNTER — Other Ambulatory Visit: Payer: Self-pay

## 2021-03-01 DIAGNOSIS — Z5901 Sheltered homelessness: Secondary | ICD-10-CM

## 2021-03-01 DIAGNOSIS — Z653 Problems related to other legal circumstances: Secondary | ICD-10-CM | POA: Diagnosis not present

## 2021-03-01 DIAGNOSIS — Z791 Long term (current) use of non-steroidal anti-inflammatories (NSAID): Secondary | ICD-10-CM | POA: Diagnosis not present

## 2021-03-01 DIAGNOSIS — Z811 Family history of alcohol abuse and dependence: Secondary | ICD-10-CM

## 2021-03-01 DIAGNOSIS — M549 Dorsalgia, unspecified: Secondary | ICD-10-CM | POA: Diagnosis present

## 2021-03-01 DIAGNOSIS — Z885 Allergy status to narcotic agent status: Secondary | ICD-10-CM

## 2021-03-01 DIAGNOSIS — Z886 Allergy status to analgesic agent status: Secondary | ICD-10-CM

## 2021-03-01 DIAGNOSIS — G8929 Other chronic pain: Secondary | ICD-10-CM | POA: Diagnosis present

## 2021-03-01 DIAGNOSIS — R45851 Suicidal ideations: Secondary | ICD-10-CM | POA: Diagnosis present

## 2021-03-01 DIAGNOSIS — Z5900 Homelessness unspecified: Secondary | ICD-10-CM

## 2021-03-01 DIAGNOSIS — D571 Sickle-cell disease without crisis: Secondary | ICD-10-CM | POA: Diagnosis present

## 2021-03-01 DIAGNOSIS — F17210 Nicotine dependence, cigarettes, uncomplicated: Secondary | ICD-10-CM | POA: Diagnosis present

## 2021-03-01 DIAGNOSIS — F1414 Cocaine abuse with cocaine-induced mood disorder: Principal | ICD-10-CM | POA: Diagnosis present

## 2021-03-01 DIAGNOSIS — Z8249 Family history of ischemic heart disease and other diseases of the circulatory system: Secondary | ICD-10-CM | POA: Diagnosis not present

## 2021-03-01 DIAGNOSIS — F32A Depression, unspecified: Secondary | ICD-10-CM | POA: Diagnosis present

## 2021-03-01 DIAGNOSIS — Z832 Family history of diseases of the blood and blood-forming organs and certain disorders involving the immune mechanism: Secondary | ICD-10-CM

## 2021-03-01 DIAGNOSIS — F1410 Cocaine abuse, uncomplicated: Secondary | ICD-10-CM | POA: Diagnosis present

## 2021-03-01 DIAGNOSIS — G4700 Insomnia, unspecified: Secondary | ICD-10-CM | POA: Diagnosis present

## 2021-03-01 DIAGNOSIS — Z720 Tobacco use: Secondary | ICD-10-CM | POA: Diagnosis present

## 2021-03-01 LAB — LIPID PANEL
Cholesterol: 127 mg/dL (ref 0–200)
HDL: 43 mg/dL (ref >40)
LDL Cholesterol: 66 mg/dL (ref 0–99)
Total CHOL/HDL Ratio: 3 RATIO
Triglycerides: 89 mg/dL (ref <150)
VLDL: 18 mg/dL (ref 0–40)

## 2021-03-01 LAB — TSH: TSH: 1.73 u[IU]/mL (ref 0.350–4.500)

## 2021-03-01 MED ORDER — MAGNESIUM HYDROXIDE 400 MG/5ML PO SUSP: 30.0000 mL | Freq: Every day | ORAL | Status: DC | PRN

## 2021-03-01 MED ORDER — TRAZODONE HCL 50 MG PO TABS
50.0000 mg | ORAL_TABLET | Freq: Every evening | ORAL | Status: DC | PRN
  Administered 2021-03-01 – 2021-03-05 (×5): 50 mg via ORAL
  Filled 2021-03-01 (×5): qty 1

## 2021-03-01 MED ORDER — ALUM & MAG HYDROXIDE-SIMETH 200-200-20 MG/5ML PO SUSP: 30.0000 mL | ORAL | Status: DC | PRN

## 2021-03-01 MED ORDER — HYDROXYZINE HCL 25 MG PO TABS
25.0000 mg | ORAL_TABLET | Freq: Three times a day (TID) | ORAL | Status: DC | PRN
  Administered 2021-03-01 – 2021-03-04 (×5): 25 mg via ORAL
  Filled 2021-03-01 (×7): qty 1

## 2021-03-01 MED ORDER — NICOTINE POLACRILEX 2 MG MT GUM
2.0000 mg | CHEWING_GUM | OROMUCOSAL | Status: DC | PRN
  Administered 2021-03-01 – 2021-03-03 (×2): 2 mg via ORAL
  Filled 2021-03-01 (×2): qty 1

## 2021-03-01 MED ORDER — ACETAMINOPHEN 325 MG PO TABS
650.0000 mg | ORAL_TABLET | Freq: Four times a day (QID) | ORAL | Status: DC | PRN
  Administered 2021-03-02 – 2021-03-06 (×8): 650 mg via ORAL
  Filled 2021-03-01 (×9): qty 2

## 2021-03-01 NOTE — BHH Group Notes (Signed)
Pt did not attend group. 

## 2021-03-01 NOTE — BHH Suicide Risk Assessment (Signed)
[facility] Admission Suicide Risk Assessment   Nursing information obtained from:  Patient Demographic factors:  Male,Low socioeconomic status Current Mental Status:  Suicidal ideation indicated by patient,Self-harm thoughts Loss Factors:  Legal issues,Financial problems / change in socioeconomic status Historical Factors:  NA Risk Reduction Factors:  NA  Total Time spent with patient: 30 minutes Principal Problem: Cocaine abuse with cocaine-induced mood disorder (HCC) Diagnosis:  Principal Problem:   Cocaine abuse with cocaine-induced mood disorder (HCC) Active Problems:   Sickle cell anemia (HCC)   Tobacco use   Homelessness   Cocaine abuse ([place])   Depression  Subjective Data: Medical record reviewed.  Patient's case discussed in detail with members of the treatment team and with the APP.  I met with and evaluated the patient on the unit this afternoon. Scott Norton is a 55 year old male with a history of cocaine abuse, sickle cell disease and chronic pain who presented to [facility] ED voluntarily reporting worsening depressive symptoms and suicidal ideation with thoughts of jumping off a bridge.  The patient reports multiple recent stressors that have contributed to his mood and suicidal ideation including physical pain, having to leave his hotel room, conflict with his probation officer and legal stressors.  Patient states that he left his hotel room and was pulling his suitcase and TV over a bridge to get to the pawn shop tip on the TV for cash.  While doing so he "felt like not being around anymore."  He had thoughts about harming himself by jumping off the bridge rather than letting his probation officer lock him up.  Instead he decided to take the bus to the hospital to seek help.  He denies thoughts of harming himself or others in the hospital and states that he can let staff know if he starts to experience these thoughts.   Patient reports depressed mood, trouble sleeping and poor  appetite.  He does acknowledge that he has been eating and sleeping better since he was admitted to the hospital.  He states that he was drinking alcohol prior to admission but does not drink alcohol every day.  He also was using cocaine prior to admission.  The patient denies any history of prior inpatient psychiatric admissions.  He denies any history of suicide attempts or psychiatric medication trials.  The patient denies any family history of suicide or other mental health problems.  He reports alcohol problems in both his parents and his father died of cirrhosis.  Patient is cooperative and polite during our conversation.  He maintains good eye contact.  Mood is intermittently irritable as he discusses his ongoing conflict with his probation officer.  He denies any psychotic symptoms or any perceptual abnormalities and does not appear to respond to internal stimuli.  He states that his biggest problem is that he does not have housing.    Continued Clinical Symptoms:  Alcohol Use Disorder Identification Test Final Score (AUDIT): 2 The "Alcohol Use Disorders Identification Test", Guidelines for Use in Primary Care, Second Edition.  World Pharmacologist [facility]). Score between 0-7:  no or low risk or alcohol related problems. Score between 8-15:  moderate risk of alcohol related problems. Score between 16-19:  high risk of alcohol related problems. Score 20 or above:  warrants further diagnostic evaluation for alcohol dependence and treatment.   CLINICAL FACTORS:   Severe Anxiety and/or Agitation Depression:   Comorbid alcohol abuse/dependence Impulsivity Insomnia Alcohol/Substance Abuse/Dependencies More than one psychiatric diagnosis   Musculoskeletal: Strength & Muscle Tone: within  normal limits Gait & Station: normal Patient leans: N/A  Psychiatric Specialty Exam:  Presentation  General Appearance: Disheveled  Eye Contact:Good  Speech:Clear and Coherent; Normal Rate  Speech  Volume:Normal  Handedness:Right   Mood and Affect  Mood:Depressed  Affect:Congruent; Depressed   Thought Process  Thought Processes:Coherent  Descriptions of Associations:Intact  Orientation:Full (Time, Place and Person)  Thought Content:Logical  History of Schizophrenia/Schizoaffective disorder:No  Duration of Psychotic Symptoms:No data recorded Hallucinations:Hallucinations: None  Ideas of Reference:None  Suicidal Thoughts:Suicidal Thoughts: Yes, Active SI Active Intent and/or Plan: With Intent; With Plan; With Means to [place]  Homicidal Thoughts:Homicidal Thoughts: No   Sensorium  Memory:Immediate Good; Recent Good; Remote Good  Judgment:Fair  Insight:Fair   Executive Functions  Concentration:Good  Attention Span:Good  [place] of Knowledge:Good  Language:Good   Psychomotor Activity  Psychomotor Activity:Psychomotor Activity: Normal   Assets  Assets:Communication Skills; Desire for Improvement; Leisure Time; Physical Health; Resilience   Sleep  Sleep:Sleep: Good Number of Hours of Sleep: 8    Physical Exam: Physical Exam Vitals and nursing note reviewed.  HENT:     Head: Normocephalic and atraumatic.  Pulmonary:     Effort: Pulmonary effort is normal.  Neurological:     General: No focal deficit present.     Mental Status: He is alert and oriented to person, place, and time.    ROS Blood pressure (!) 135/91, pulse 70, temperature 98.5 F (36.9 C), temperature source Oral, resp. rate 18, height 6' 3"  (1.905 m), weight 70.8 kg, SpO2 96 %. Body mass index is 19.5 kg/m.   COGNITIVE FEATURES THAT CONTRIBUTE TO RISK:  Polarized thinking    SUICIDE RISK:   Moderate:  Frequent suicidal ideation with limited intensity, and duration, some specificity in terms of plans, no associated intent, good self-control, limited dysphoria/symptomatology, some risk factors present, and identifiable protective factors, including  available and accessible social support.  PLAN OF CARE: The patient is a 55 year old male with cocaine use disorder and cocaine induced mood disorder with worsening mood symptoms and suicidal ideation in the context of recent cocaine use, homelessness and other psychosocial stressors.  Patient will be admitted to the milieu.  We will continue every 15-minute safety checks.  Encouraged participation in group therapy and therapeutic milieu.  Most recent available lab results reviewed.  CMP revealed glucose of 106, creatinine of 1.39, alkaline phosphatase of 193 and total bili of 1.8.  Lipid panel was WNL.  CBC revealed RBC of 2.79, hemoglobin 8.7, hematocrit 26.7, RDW of 19.6, nRBC 0.9 and otherwise WNL.  Hgb/Hct on 02/27/2021 of 8.7 and 26.7 are up from Hgb/Hct on 02/14/2021 of 8.2 and 22.9.  TSH was 1.730.  Urine drug screen was positive for cocaine.  Medications per MAR.  Plan is to start low-dose Lexapro.  Anticipated length of stay 3 to 5 days.  Patient would benefit from referral to substance abuse treatment program if he is willing to participate and can be accepted into a program.  I certify that inpatient services furnished can reasonably be expected to improve the patient's condition.   Arthor Captain, MD 03/01/2021, 5:41 PM

## 2021-03-01 NOTE — Progress Notes (Signed)
Recreation Therapy Notes  Date: 5.11.22 Time: 0930 Location: [address] Dayroom  Group Topic: Stress Management   Goal Area(s) Addresses:  Patient will actively participate in stress management techniques presented during session.  Patient will successfully identify benefit of practicing stress management post d/c.   Behavioral Response: Appropriate  Intervention: Guided exercise with ambient sound and script  Activity :Guided Imagery  LRT read a script that focused enjoying the sights and sounds of being in a wildlife sanctuary.  Patients were to listen, focus on their breathing and relax to follow along as script was being read.  Education:  Stress Management, Discharge Planning.   Education Outcome: Acknowledges education  Clinical Observations/Feedback: Patient actively engaged in technique introduced, expressed no concerns.     Caroll Rancher, LRT/CTRS        Caroll Rancher A 03/01/2021 10:48 AM

## 2021-03-01 NOTE — Progress Notes (Signed)
D: Patient presents with anxious affect at time of assessment. Patient reports one of his major stressors is the treatment he receives from his parole officer. Patient states they feel like she is purposely trying to get him sent back to prison. Patient is positive for passive SI at this time but verbally contracts for safety. Patient denies HI/AVH at this time.  A: Provided positive reinforcement and encouragement.  R: Patient cooperative and receptive to efforts. Patient remains safe on the unit.   03/01/21 2141  Psych Admission Type (Psych Patients Only)  Admission Status Voluntary  Psychosocial Assessment  Patient Complaints Depression;Anxiety  Eye Contact Fair  Facial Expression Animated  Affect Anxious;Appropriate to circumstance  Speech Logical/coherent  Interaction Arrogant;Assertive  Motor Activity Slow  Appearance/Hygiene Unremarkable  Behavior Characteristics Cooperative;Appropriate to situation  Mood Depressed;Anxious  Thought Process  Coherency WDL  Content WDL  Delusions None reported or observed  Perception WDL  Hallucination None reported or observed  Judgment WDL  Confusion None  Danger to Self  Current suicidal ideation? Passive  Self-Injurious Behavior No self-injurious ideation or behavior indicators observed or expressed   Agreement Not to Harm Self Yes  Description of Agreement verbal contract  Danger to Others  Danger to Others None reported or observed

## 2021-03-01 NOTE — Tx Team (Signed)
Interdisciplinary Treatment and Diagnostic Plan Update  03/01/2021 Time of Session: 9:40am Scott Norton MRN: [id number]  Principal Diagnosis: Cocaine abuse with cocaine-induced mood disorder (Scott Norton)  Secondary Diagnoses: Principal Problem:   Cocaine abuse with cocaine-induced mood disorder (Scott Norton) Active Problems:   Sickle cell anemia (Scott Norton)   Tobacco use   Homelessness   Cocaine abuse (Scott Norton)   Depression   Current Medications:  Current Facility-Administered Medications  Medication Dose Route Frequency Provider Last Rate Last Admin  . acetaminophen (TYLENOL) tablet 650 mg  650 mg Oral Q6H PRN Revonda Humphrey, NP      . alum & mag hydroxide-simeth (MAALOX/MYLANTA) 200-200-20 MG/5ML suspension 30 mL  30 mL Oral Q4H PRN Revonda Humphrey, NP      . hydrOXYzine (ATARAX/VISTARIL) tablet 25 mg  25 mg Oral TID PRN Revonda Humphrey, NP      . magnesium hydroxide (MILK OF MAGNESIA) suspension 30 mL  30 mL Oral Daily PRN Revonda Humphrey, NP      . traZODone (DESYREL) tablet 50 mg  50 mg Oral QHS PRN Revonda Humphrey, NP       PTA Medications: Medications Prior to Admission  Medication Sig Dispense Refill Last Dose  . ibuprofen (ADVIL) 800 MG tablet TAKE 1 TABLET BY MOUTH THREE TIMES DAILY (Patient taking differently: Take 800 mg by mouth 3 (three) times daily.) 30 tablet 0   . oxyCODONE (ROXICODONE) 15 MG immediate release tablet Take 15 mg by mouth every 4 (four) hours as needed for pain.     Marland Kitchen oxyCODONE-acetaminophen (PERCOCET) 10-325 MG tablet Take 1 tablet by mouth every 4 (four) hours as needed for up to 15 days for pain. (Patient not taking: No sig reported) 60 tablet 0     Patient Stressors: Financial difficulties Health problems Loss of no place to currently live  Patient Strengths: Ability for insight Average or above average intelligence Capable of independent living Communication skills  Treatment Modalities: Medication Management, Group therapy, Case management,  1  to 1 session with clinician, Psychoeducation, Recreational therapy.   Physician Treatment Plan for Primary Diagnosis: Cocaine abuse with cocaine-induced mood disorder (Scott Norton) Long Term Goal(s): Improvement in symptoms so as ready for discharge Improvement in symptoms so as ready for discharge   Short Term Goals: Ability to identify changes in lifestyle to reduce recurrence of condition will improve Ability to disclose and discuss suicidal ideas Ability to identify and develop effective coping behaviors will improve Ability to identify triggers associated with substance abuse/mental health issues will improve Ability to identify changes in lifestyle to reduce recurrence of condition will improve Ability to disclose and discuss suicidal ideas Ability to identify and develop effective coping behaviors will improve Compliance with prescribed medications will improve Ability to identify triggers associated with substance abuse/mental health issues will improve  Medication Management: Evaluate patient's response, side effects, and tolerance of medication regimen.  Therapeutic Interventions: 1 to 1 sessions, Unit Group sessions and Medication administration.  Evaluation of Outcomes: Not Met  Physician Treatment Plan for Secondary Diagnosis: Principal Problem:   Cocaine abuse with cocaine-induced mood disorder (Scott Norton) Active Problems:   Sickle cell anemia (Scott Norton)   Tobacco use   Homelessness   Cocaine abuse (Scott Norton)   Depression  Long Term Goal(s): Improvement in symptoms so as ready for discharge Improvement in symptoms so as ready for discharge   Short Term Goals: Ability to identify changes in lifestyle to reduce recurrence of condition will improve Ability to disclose and discuss suicidal ideas  Ability to identify and develop effective coping behaviors will improve Ability to identify triggers associated with substance abuse/mental health issues will improve Ability to identify changes in  lifestyle to reduce recurrence of condition will improve Ability to disclose and discuss suicidal ideas Ability to identify and develop effective coping behaviors will improve Compliance with prescribed medications will improve Ability to identify triggers associated with substance abuse/mental health issues will improve     Medication Management: Evaluate patient's response, side effects, and tolerance of medication regimen.  Therapeutic Interventions: 1 to 1 sessions, Unit Group sessions and Medication administration.  Evaluation of Outcomes: Not Met   RN Treatment Plan for Primary Diagnosis: Cocaine abuse with cocaine-induced mood disorder (Scott Norton) Long Term Goal(s): Knowledge of disease and therapeutic regimen to maintain health will improve  Short Term Goals: Ability to remain free from injury will improve, Ability to verbalize frustration and anger appropriately will improve, Ability to demonstrate self-control, Ability to identify and develop effective coping behaviors will improve and Compliance with prescribed medications will improve  Medication Management: RN will administer medications as ordered by provider, will assess and evaluate patient's response and provide education to patient for prescribed medication. RN will report any adverse and/or side effects to prescribing provider.  Therapeutic Interventions: 1 on 1 counseling sessions, Psychoeducation, Medication administration, Evaluate responses to treatment, Monitor vital signs and CBGs as ordered, Perform/monitor CIWA, COWS, AIMS and Fall Risk screenings as ordered, Perform wound care treatments as ordered.  Evaluation of Outcomes: Not Met   LCSW Treatment Plan for Primary Diagnosis: Cocaine abuse with cocaine-induced mood disorder (Scott Norton) Long Term Goal(s): Safe transition to appropriate next level of care at discharge, Engage patient in therapeutic group addressing interpersonal concerns.  Short Term Goals: Engage patient in  aftercare planning with referrals and resources, Increase social support, Increase ability to appropriately verbalize feelings, Identify triggers associated with mental health/substance abuse issues and Increase skills for wellness and recovery  Therapeutic Interventions: Assess for all discharge needs, 1 to 1 time with Social worker, Explore available resources and support systems, Assess for adequacy in community support network, Educate family and significant other(s) on suicide prevention, Complete Psychosocial Assessment, Interpersonal group therapy.  Evaluation of Outcomes: Not Met   Progress in Treatment: Attending groups: Yes. Participating in groups: Yes. Taking medication as prescribed: No. and As evidenced by:  no medications have been ordered at this time. Toleration medication: No. Family/Significant other contact made: No, will contact:  if consent is provided Patient understands diagnosis: Yes. Discussing patient identified problems/goals with staff: Yes. Medical problems stabilized or resolved: Yes. Denies suicidal/homicidal ideation: Yes. Issues/concerns per patient self-inventory: No.  New problem(s) identified: No, Describe:  none  New Short Term/Long Term Goal(s): detox, medication management for mood stabilization; elimination of SI thoughts; development of comprehensive mental wellness/sobriety plan   Patient Goals:  Did not attend  Discharge Plan or Barriers: Patient recently admitted. CSW will continue to follow and assess for appropriate referrals and possible discharge planning.    Reason for Continuation of Hospitalization: Depression Medication stabilization Suicidal ideation Withdrawal symptoms  Estimated Length of Stay: 3-5 days  Attendees: Patient: Did not attend 03/01/2021   Physician:  03/01/2021   Nursing:  03/01/2021   RN Care Manager: 03/01/2021   Social Worker: Darletta Moll, [place] 03/01/2021   Recreational Therapist:  03/01/2021   Other:   03/01/2021   Other:  03/01/2021   Other: 03/01/2021       Scribe for Treatment Team: Vassie Moselle, LCSW 03/01/2021 11:55  AM

## 2021-03-01 NOTE — Progress Notes (Signed)
The patient verbalized in group that his positive event for the day is that he woke up and felt more optimistic. He was particularly proud of the fact that he did not have any violent thoughts today.

## 2021-03-01 NOTE — [facility] Group Notes (Signed)
Adult Psychoeducational Group Note  Date:  03/01/2021 Time:  11:48 AM  Group Topic/Focus:  Personal Choices and Values:   The focus of this group is to help patients assess and explore the importance of values in their lives, how their values affect their decisions, how they express their values and what opposes their expression.  Participation Level:  Active  Participation Quality:  Appropriate and Attentive  Affect:  Appropriate  Cognitive:  Alert and Appropriate  Insight: Appropriate and Good  Engagement in Group:  Engaged  Modes of Intervention:  Discussion  Additional Comments:  Pt attended both MHT groups this morning.  Scott Norton 03/01/2021, 11:48 AM

## 2021-03-01 NOTE — Progress Notes (Signed)
Patient ID: Karanveer Ramakrishnan, male   DOB: Mar 29, 1966, 55 y.o.   MRN: [id number]   Sinai is a 55 year old voluntary admission form [facility]. Patient went to ED complaining of body aches, homelessness and suicidal thoughts. Patient reported thoughts of jumping from a bridge. He reports he wants help with depression and SI while here. Does have some cocaine and THC use but minimizes when trying to get detailed information just saying "not often". Does have a hx of sickle cell anemia and arthritis. Does report prescription of roxi and oxycodone. Smokes cigars and doesn't want a nicotine patch at present. Currently is on probation for an undisclosed charge and has an ankle bracelet on left ankle and his charger is in the [address] hall med room. Patient very vague about his depression except his stressor of homelessness. Reported to someone at the hospital that he needs an address soon for his probation. Cooperative with admission process except with some questions saying "I've already answered this". No medications started at the ED. Given tray on the unit.

## 2021-03-01 NOTE — [facility] Group Notes (Signed)
LCSW Group Therapy Note  Type of Therapy/Topic: Group Therapy: Six Dimensions of Wellness  Participation Level: Did Not Attend  Description of Group:  This group will address the concept of wellness and the six concepts of wellness: occupational, physical, social, intellectual, spiritual, and emotional. Patients will be encouraged to process areas in their lives that are out of balance and identify reasons for remaining unbalanced. Patients will be encouraged to explore ways to practice healthy habits daily to attain better physical and mental health outcomes.  Therapeutic Goals:  1. Identify aspects of wellness that they are doing well.  2. Identify aspects of wellness that they would like to improve upon.  3. Identify one action they can take to improve an aspect of wellness in their lives.  Summary of Patient Progress: Did not attend

## 2021-03-01 NOTE — Progress Notes (Addendum)
Pt requested to get his phone out of locker so he could contact parole officer.  Pt called parole officer via speaker phone in search room.  This RN spoke to [employer] fast streamer and told officer that pt was admitted at [facility] and pt would more than likely be at [facility] for a few days.  Officer verbalized understanding.

## 2021-03-01 NOTE — Tx Team (Signed)
Initial Treatment Plan 03/01/2021 1:04 AM [name] JYN:[id number]    PATIENT STRESSORS: Financial difficulties Health problems Loss of no place to currently live   PATIENT STRENGTHS: Ability for insight Average or above average intelligence Capable of independent living Communication skills   PATIENT IDENTIFIED PROBLEMS:    "work on depression and suicidal thoughts"   Does have a hx of cocaine and THC use                 DISCHARGE CRITERIA:  Improved stabilization in mood, thinking, and/or behavior Reduction of life-threatening or endangering symptoms to within safe limits  PRELIMINARY DISCHARGE PLAN: Outpatient therapy Return to previous living arrangement  PATIENT/FAMILY INVOLVEMENT: This treatment plan has been presented to and reviewed with the patient, Scott Norton, and/or family member, .  The patient and family have been given the opportunity to ask questions and make suggestions.  Andres Ege, RN 03/01/2021, 1:04 AM

## 2021-03-01 NOTE — H&P (Signed)
Psychiatric Admission Assessment Adult  Patient Identification: Scott Norton MRN:  [id number] Date of Evaluation:  03/01/2021 Chief Complaint:  I need help with my mental health Principal Diagnosis: Cocaine abuse with cocaine-induced mood disorder ([place]) Diagnosis:  Principal Problem:   Cocaine abuse with cocaine-induced mood disorder ([place]) Active Problems:   Sickle cell anemia (HCC)   Tobacco use   Homelessness   Cocaine abuse ([place])   Depression  History of Present Illness: Scott Norton is a 55 year old male who presented to [facility] with depressed mood in the face of pain and dealing with his sickle cell disease. He has no previous psychiatric history. He stated he is on probation for the next 3 years for selling drugs to an undercover cop. He has a Research officer, trade union and the thought of calling her when he is discharged is making him depressed. He is calm, cooperative, pleasant on approach. He has been incarcerated in the past and stated he feels this parole officer is trying to find some reason to put him in jail again. Patient is homeless and has been living in week to week motels. He had to leave his motel on Monday and has not been able to find another one or an apartment. He stated he told his probation officer he had to leave his motel and she told him he needs an address by 7 PM that night. He decided to go to the hospital instead. He is a registered sex offender which adds to his difficulty finding a place to live. He receives SSI for his sickle cell. He sees [employer] at the sickle cel clinic. He was hospitalized 02/08/2021 for his sickle cell when his Hgb dropped to 6.1, currently his H/H  8.7/26.7. He is asymptomatic. His UDS is positive for cocaine, BAL negative. He denies daily drug use, he stated "if I have a dirty drug test my PO will send me to jail." He denies HI/AVH, paranoia and delusions. He stated he would feel suicidal if he was told he had to be discharged today. He stated he feels  safe in the hospital. His main issue at this time is housing and social stressors. He is mildly depressed. We discussed starting an antidepressant. He is open to this. Will start low dose Lexapro. Will continue to monitor for safety. Encouragement and support provided.    Associated Signs/Symptoms: Depression Symptoms:  depressed mood, Duration of Depression Symptoms: Greater than two weeks  (Hypo) Manic Symptoms:  None identified or observed, patient denies Anxiety Symptoms:  Patient denies anxiety symptoms Psychotic Symptoms:  Hallucinations: None, patient denies psychotic symptoms.  PTSD Symptoms: Negative Total Time spent with patient: 1 hour  Past Psychiatric History:   Is the patient at risk to self? No.  Has the patient been a risk to self in the past 6 months? No.  Has the patient been a risk to self within the distant past? No.  Is the patient a risk to others? No.  Has the patient been a risk to others in the past 6 months? No.  Has the patient been a risk to others within the distant past? No.   Prior Inpatient Therapy:  No Prior Outpatient Therapy:  No  Alcohol Screening: Patient refused Alcohol Screening Tool: Yes 1. How often do you have a drink containing alcohol?: Monthly or less 2. How many drinks containing alcohol do you have on a typical day when you are drinking?: 1 or 2 3. How often do you have six or more  drinks on one occasion?: Less than monthly AUDIT-C Score: 2 4. How often during the last year have you found that you were not able to stop drinking once you had started?: Never 5. How often during the last year have you failed to do what was normally expected from you because of drinking?: Never 6. How often during the last year have you needed a first drink in the morning to get yourself going after a heavy drinking session?: Never 7. How often during the last year have you had a feeling of guilt of remorse after drinking?: Never 8. How often during the  last year have you been unable to remember what happened the night before because you had been drinking?: Never 9. Have you or someone else been injured as a result of your drinking?: No 10. Has a relative or friend or a doctor or another health worker been concerned about your drinking or suggested you cut down?: No Alcohol Use Disorder Identification Test Final Score (AUDIT): 2 Substance Abuse History in the last 12 months:  Yes.   Consequences of Substance Abuse: Legal Consequences:  Incarceration, probation Previous Psychotropic Medications: No  Psychological Evaluations: No  Past Medical History:  Past Medical History:  Diagnosis Date  . AKI (acute kidney injury) ([place]) 02/08/2021  . Anemia   . Depression   . Heart murmur   . Seasonal allergies    wool & grass  . Sickle cell anemia (HCC)     Past Surgical History:  Procedure Laterality Date  . NO PAST SURGERIES    . ROOT CANAL     Family History:  Family History  Problem Relation Age of Onset  . Alcohol abuse Mother   . Sickle cell trait Mother   . Hypertension Mother   . Alcohol abuse Father   . Sickle cell trait Father   . Early death Father        due to alcoholism  . Hypertension Father   . Hypertension Sister   . Hypertension Brother   . Hypertension Maternal Grandmother   . Hypertension Maternal Grandfather   . Hypertension Paternal Grandmother   . Hypertension Paternal Grandfather    Family Psychiatric  History: Patient denies Tobacco Screening: Have you used any form of tobacco in the last 30 days? (Cigarettes, Smokeless Tobacco, Cigars, and/or Pipes): Yes Tobacco use, Select all that apply: cigar use daily Are you interested in Tobacco Cessation Medications?: No, patient refused Counseled patient on smoking cessation including recognizing danger situations, developing coping skills and basic information about quitting provided: Refused/Declined practical counseling Social History:  Social History    Substance and Sexual Activity  Alcohol Use Yes   Comment: occasional but indicated etoh abuse on health hx questionaire     Social History   Substance and Sexual Activity  Drug Use Yes  . Types: Marijuana, Cocaine    Additional Social History:      History of alcohol / drug use?: Yes Negative Consequences of Use: Financial,Legal Name of Substance 1: cocaine 1 - Amount (size/oz): varies 1 - Frequency: varies Name of Substance 2: THC 2 - Amount (size/oz): varies 2 - Frequency: varies                Allergies:   Allergies  Allergen Reactions  . Aspirin Other (See Comments)    Increased heart rate  . Tape Other (See Comments)    rash  . Tramadol Hives and Itching   Lab Results:  Results for orders placed  or performed during the hospital encounter of 03/01/21 (from the past 48 hour(s))  Lipid panel     Status: None   Collection Time: 03/01/21  6:18 AM  Result Value Ref Range   Cholesterol 127 0 - 200 mg/dL   Triglycerides 89 <150 mg/dL   HDL 43 >40 mg/dL   Total CHOL/HDL Ratio 3.0 RATIO   VLDL 18 0 - 40 mg/dL   LDL Cholesterol 66 0 - 99 mg/dL    Comment:        Total Cholesterol/HDL:CHD Risk Coronary Heart Disease Risk Table                     Men   Women  1/2 Average Risk   3.4   3.3  Average Risk       5.0   4.4  2 X Average Risk   9.6   7.1  3 X Average Risk  23.4   11.0        Use the calculated Patient Ratio above and the CHD Risk Table to determine the patient's CHD Risk.        ATP III CLASSIFICATION (LDL):  <100     mg/dL   Optimal  100-129  mg/dL   Near or Above                    Optimal  130-159  mg/dL   Borderline  160-189  mg/dL   High  >190     mg/dL   Very High Performed at [facility] [address], [place], [place] [address]   TSH     Status: None   Collection Time: 03/01/21  6:18 AM  Result Value Ref Range   TSH 1.730 0.350 - 4.500 uIU/mL    Comment: Performed by a 3rd Generation assay with a functional  sensitivity of <=0.01 uIU/mL. Performed at [facility], [place] [address] [place],  [address]     Blood Alcohol level:  Lab Results  Component Value Date   ETH <10 02/27/2021   ETH <10 30/86/5784    Metabolic Disorder Labs:  No results found for: HGBA1C, MPG No results found for: PROLACTIN Lab Results  Component Value Date   CHOL 127 03/01/2021   TRIG 89 03/01/2021   HDL 43 03/01/2021   CHOLHDL 3.0 03/01/2021   VLDL 18 03/01/2021   LDLCALC 66 03/01/2021   LDLCALC 60 06/04/2012    Current Medications: Current Facility-Administered Medications  Medication Dose Route Frequency Provider Last Rate Last Admin  . acetaminophen (TYLENOL) tablet 650 mg  650 mg Oral Q6H PRN Revonda Humphrey, NP      . alum & mag hydroxide-simeth (MAALOX/MYLANTA) 200-200-20 MG/5ML suspension 30 mL  30 mL Oral Q4H PRN Revonda Humphrey, NP      . hydrOXYzine (ATARAX/VISTARIL) tablet 25 mg  25 mg Oral TID PRN Revonda Humphrey, NP      . magnesium hydroxide (MILK OF MAGNESIA) suspension 30 mL  30 mL Oral Daily PRN Revonda Humphrey, NP      . traZODone (DESYREL) tablet 50 mg  50 mg Oral QHS PRN Revonda Humphrey, NP       PTA Medications: Medications Prior to Admission  Medication Sig Dispense Refill Last Dose  . ibuprofen (ADVIL) 800 MG tablet TAKE 1 TABLET BY MOUTH THREE TIMES DAILY (Patient taking differently: Take 800 mg by mouth 3 (three) times daily.) 30 tablet 0   .  oxyCODONE (ROXICODONE) 15 MG immediate release tablet Take 15 mg by mouth every 4 (four) hours as needed for pain.     Marland Kitchen oxyCODONE-acetaminophen (PERCOCET) 10-325 MG tablet Take 1 tablet by mouth every 4 (four) hours as needed for up to 15 days for pain. (Patient not taking: No sig reported) 60 tablet 0     Musculoskeletal: Strength & Muscle Tone: within normal limits Gait & Station: normal Patient leans: N/A   Psychiatric Specialty Exam:  Presentation  General Appearance: Disheveled  Eye  Contact:Good  Speech:Clear and Coherent; Normal Rate  Speech Volume:Normal  Handedness:Right  Mood and Affect  Mood:Depressed  Affect:Congruent; Depressed  Thought Process  Thought Processes:Coherent  Duration of Psychotic Symptoms: No data recorded Past Diagnosis of Schizophrenia or Psychoactive disorder: No  Descriptions of Associations:Intact  Orientation:Full (Time, Place and Person)  Thought Content:Logical  Hallucinations:Hallucinations: None  Ideas of Reference:None  Suicidal Thoughts:Suicidal Thoughts: Yes, Active SI Active Intent and/or Plan: With Intent; With Plan; With Means to [place]  Homicidal Thoughts:Homicidal Thoughts: No  Sensorium  Memory:Immediate Good; Recent Good; Remote Good  Judgment:Fair  Insight:Fair  Executive Functions  Concentration:Good  Attention Span:Good  [place] of Knowledge:Good  Language:Good  Psychomotor Activity  Psychomotor Activity:Psychomotor Activity: Normal  Assets  Assets:Communication Skills; Desire for Improvement; Leisure Time; Physical Health; Resilience  Sleep  Sleep:Sleep: Good Number of Hours of Sleep: 8  Physical Exam: Physical Exam Vitals and nursing note reviewed.  Musculoskeletal:        General: Normal range of motion.     Cervical back: Normal range of motion.  Neurological:     Mental Status: He is alert and oriented to person, place, and time.  Psychiatric:        Mood and Affect: Mood is depressed.        Speech: Speech normal.        Behavior: Behavior normal. Behavior is cooperative.        Cognition and Memory: Cognition normal.    Review of Systems  Constitutional: Negative for fever.  HENT: Negative for congestion and sore throat.   Respiratory: Negative for cough and shortness of breath.   Cardiovascular: Negative for chest pain.  Gastrointestinal: Negative.   Genitourinary: Negative.   Musculoskeletal: Negative.   Neurological: Negative.    Blood  pressure (!) 135/91, pulse 70, temperature 98.5 F (36.9 C), temperature source Oral, resp. rate 18, height 6' 3"  (1.905 m), weight 70.8 kg, SpO2 96 %. Body mass index is 19.5 kg/m.  Treatment Plan Summary: 1. Admit to 400 hall for crisis management and stabilization, estimated length of stay 3-5 days.    2. Medication management to reduce current symptoms to base line and improve the patient's overall level of functioning: See MAR, Md's SRA & treatment plan.   Observation Level/Precautions:  15 minute checks  Laboratory: Labs reviewed: CMP-Creatinine 1.39, Alk Phos 193. CBC with diff-RBC 2.79, H?H8.7/26.7, RDW 19.6, nRBC 6.9. Lipid panel WNL, Glucose 104, TSH 1.730. UDS positive cocaine. Alcohol <37, Salicylate lvl <8.5. COVID/Influenza negative. A1c pending.   Psychotherapy:  Group therapy  Medications:  See MAR  Consultations:  TBD  Discharge Concerns:  Housing, safety, medication compliance  Estimated LOS:  Other:     Physician Treatment Plan for Primary Diagnosis: Cocaine abuse with cocaine-induced mood disorder ([place]) Long Term Goal(s): Improvement in symptoms so as ready for discharge  Short Term Goals: Ability to identify changes in lifestyle to reduce recurrence of condition will improve, Ability to disclose and  discuss suicidal ideas, Ability to identify and develop effective coping behaviors will improve and Ability to identify triggers associated with substance abuse/mental health issues will improve  Physician Treatment Plan for Secondary Diagnosis: Principal Problem:   Cocaine abuse with cocaine-induced mood disorder ([place]) Active Problems:   Sickle cell anemia (HCC)   Tobacco use   Homelessness   Cocaine abuse ([place])   Depression  Long Term Goal(s): Improvement in symptoms so as ready for discharge  Short Term Goals: Ability to identify changes in lifestyle to reduce recurrence of condition will improve, Ability to disclose and discuss suicidal ideas, Ability to identify  and develop effective coping behaviors will improve, Compliance with prescribed medications will improve and Ability to identify triggers associated with substance abuse/mental health issues will improve  I certify that inpatient services furnished can reasonably be expected to improve the patient's condition.    Ethelene Hal, NP 5/11/202210:18 AM

## 2021-03-01 NOTE — Progress Notes (Signed)
   03/01/21 0800  Psych Admission Type (Psych Patients Only)  Admission Status Voluntary  Psychosocial Assessment  Patient Complaints Depression  Eye Contact Fair  Facial Expression Flat  Affect Anxious;Appropriate to circumstance  Speech Logical/coherent  Interaction Assertive  Motor Activity Slow  Appearance/Hygiene Unremarkable;In scrubs  Behavior Characteristics Cooperative;Appropriate to situation;Fidgety  Mood Depressed;Anxious;Pleasant  Thought Process  Coherency WDL  Content WDL  Delusions WDL  Perception WDL  Hallucination None reported or observed  Judgment WDL  Confusion WDL  Danger to Self  Current suicidal ideation? Passive  Self-Injurious Behavior No self-injurious ideation or behavior indicators observed or expressed   Agreement Not to Harm Self Yes  Description of Agreement contracts  Danger to Others  Danger to Others None reported or observed

## 2021-03-02 LAB — HEMOGLOBIN A1C
Hgb A1c MFr Bld: <4.2 % — ABNORMAL LOW (ref 4.8–5.6)
Mean Plasma Glucose: <74 mg/dL

## 2021-03-02 MED ORDER — PROSIGHT PO TABS
1.0000 | ORAL_TABLET | Freq: Every day | ORAL | Status: DC
  Administered 2021-03-03 – 2021-03-06 (×4): 1 via ORAL
  Filled 2021-03-02 (×6): qty 1

## 2021-03-02 MED ORDER — ESCITALOPRAM OXALATE 5 MG PO TABS
5.0000 mg | ORAL_TABLET | Freq: Every day | ORAL | Status: DC
  Administered 2021-03-02 – 2021-03-06 (×5): 5 mg via ORAL
  Filled 2021-03-02 (×8): qty 1

## 2021-03-02 NOTE — Progress Notes (Signed)
[facility] MD Progress Note  03/02/2021 3:58 PM Scott Norton  MRN:  [id number]   Reason for admission: Scott Norton is a 55 year old male with a history of cocaine abuse, sickle cell disease and chronic pain who presented to [facility] ED voluntarily reporting worsening depressive symptoms and suicidal ideation with thoughts of jumping off a bridge.    Objective: Medical record reviewed.  Patient's case discussed in detail with members of the treatment team.  I met with and evaluated the patient today for follow-up on the unit. Patient has difficulty managing his irritation over interactions with his probation officer and has trouble detaching from this topic and discussing other topics during our conversation.   Patient reports that he has felt better in the hospital and has been eating and sleeping okay.  He denies suicidal ideation in the hospital but worries that his hopelessness will return and increase after discharge if his housing and legal stressors persist.  He is concerned that he would be suicidal outside the hospital.  He denies AI or HI.  He denies any psychotic symptoms.  His mood is anxious and intermittently down about his stressors.  Patient has been able to enjoy some interactions with peers and staff on the unit.  He perseverates on phone call that he had with his probation officer yesterday that went poorly and feels she significantly adversely affects his mood.  He denies any side effects to his medications or any physical problems.  He is interested in knowing the status of assistance with housing.  I advised him to speak with the social worker regarding this issue.  The patient slept 6 hours last night.  There are no new labs.  The patient has been taking standing dose Lexapro as prescribed.  Last night he took hydroxyzine for anxiety and trazodone for sleep.  He has been attending and participating in groups.  He has been anxious and expressed concern to staff at times that probation  officer is purposely trying to get patient sent back to prison.  Vital signs this morning include BP of 120/68 sitting and 124/77 standing, pulse of 53 sitting and 66 standing, O2 sat of 98%, respirations of 16 and temperature of 98.  Principal Problem: Cocaine abuse with cocaine-induced mood disorder (HCC) Diagnosis: Principal Problem:   Cocaine abuse with cocaine-induced mood disorder (HCC) Active Problems:   Sickle cell anemia (HCC)   Tobacco use   Homelessness   Cocaine abuse ([place])   Depression  Total Time spent with patient: 20 minutes  Past Psychiatric History: See admission H&P  Past Medical History:  Past Medical History:  Diagnosis Date  . AKI (acute kidney injury) ([place]) 02/08/2021  . Anemia   . Depression   . Heart murmur   . Seasonal allergies    wool & grass  . Sickle cell anemia (HCC)     Past Surgical History:  Procedure Laterality Date  . NO PAST SURGERIES    . ROOT CANAL     Family History:  Family History  Problem Relation Age of Onset  . Alcohol abuse Mother   . Sickle cell trait Mother   . Hypertension Mother   . Alcohol abuse Father   . Sickle cell trait Father   . Early death Father        due to alcoholism  . Hypertension Father   . Hypertension Sister   . Hypertension Brother   . Hypertension Maternal Grandmother   . Hypertension Maternal Grandfather   . Hypertension  Paternal Grandmother   . Hypertension Paternal Grandfather    Family Psychiatric  History: See admission H&P Social History:  Social History   Substance and Sexual Activity  Alcohol Use Yes   Comment: occasional but indicated etoh abuse on health hx questionaire     Social History   Substance and Sexual Activity  Drug Use Yes  . Types: Marijuana, Cocaine    Social History   Socioeconomic History  . Marital status: Single    Spouse name: Not on file  . Number of children: Not on file  . Years of education: Not on file  . Highest education level: Not on file   Occupational History  . Not on file  Tobacco Use  . Smoking status: Current Every Day Smoker    Packs/day: 0.50    Types: Cigars  . Smokeless tobacco: Never Used  Vaping Use  . Vaping Use: Never used  Substance and Sexual Activity  . Alcohol use: Yes    Comment: occasional but indicated etoh abuse on health hx questionaire  . Drug use: Yes    Types: Marijuana, Cocaine  . Sexual activity: Yes  Other Topics Concern  . Not on file  Social History Narrative   Lives with Friends in [place]. Working on housing for himself   On disability for his SS disease      No children, no stable relationship   Social Determinants of Radio broadcast assistant Strain: Not on file  Food Insecurity: Not on file  Transportation Needs: Not on file  Physical Activity: Not on file  Stress: Not on file  Social Connections: Not on file   Additional Social History:    History of alcohol / drug use?: Yes Negative Consequences of Use: Financial,Legal Name of Substance 1: cocaine 1 - Amount (size/oz): varies 1 - Frequency: varies Name of Substance 2: THC 2 - Amount (size/oz): varies 2 - Frequency: varies                Sleep: Fair  Appetite:  Good  Current Medications: Current Facility-Administered Medications  Medication Dose Route Frequency Provider Last Rate Last Admin  . acetaminophen (TYLENOL) tablet 650 mg  650 mg Oral Q6H PRN Revonda Humphrey, NP      . alum & mag hydroxide-simeth (MAALOX/MYLANTA) 200-200-20 MG/5ML suspension 30 mL  30 mL Oral Q4H PRN Revonda Humphrey, NP      . escitalopram (LEXAPRO) tablet 5 mg  5 mg Oral Daily Arthor Captain, MD   5 mg at 03/02/21 1124  . hydrOXYzine (ATARAX/VISTARIL) tablet 25 mg  25 mg Oral TID PRN Revonda Humphrey, NP   25 mg at 03/01/21 2141  . magnesium hydroxide (MILK OF MAGNESIA) suspension 30 mL  30 mL Oral Daily PRN Revonda Humphrey, NP      . nicotine polacrilex (NICORETTE) gum 2 mg  2 mg Oral PRN White, Patrice L, NP   2 mg  at 03/01/21 2141  . traZODone (DESYREL) tablet 50 mg  50 mg Oral QHS PRN Revonda Humphrey, NP   50 mg at 03/01/21 2141    Lab Results:  Results for orders placed or performed during the hospital encounter of 03/01/21 (from the past 48 hour(s))  Hemoglobin A1c     Status: Abnormal   Collection Time: 03/01/21  6:18 AM  Result Value Ref Range   Hgb A1c MFr Bld <4.2 (L) 4.8 - 5.6 %    Comment: (NOTE) **Verified by repeat analysis**  Prediabetes: 5.7 - 6.4         Diabetes: >6.4         Glycemic control for adults with diabetes: <7.0    Mean Plasma Glucose <74 mg/dL    Comment: (NOTE) Performed At: [facility] [address] [place], [place] [id number] Rush Farmer MD ER:[id number]   Lipid panel     Status: None   Collection Time: 03/01/21  6:18 AM  Result Value Ref Range   Cholesterol 127 0 - 200 mg/dL   Triglycerides 89 <150 mg/dL   HDL 43 >40 mg/dL   Total CHOL/HDL Ratio 3.0 RATIO   VLDL 18 0 - 40 mg/dL   LDL Cholesterol 66 0 - 99 mg/dL    Comment:        Total Cholesterol/HDL:CHD Risk Coronary Heart Disease Risk Table                     Men   Women  1/2 Average Risk   3.4   3.3  Average Risk       5.0   4.4  2 X Average Risk   9.6   7.1  3 X Average Risk  23.4   11.0        Use the calculated Patient Ratio above and the CHD Risk Table to determine the patient's CHD Risk.        ATP III CLASSIFICATION (LDL):  <100     mg/dL   Optimal  100-129  mg/dL   Near or Above                    Optimal  130-159  mg/dL   Borderline  160-189  mg/dL   High  >190     mg/dL   Very High Performed at [facility] [address] [place], [place] [address]   TSH     Status: None   Collection Time: 03/01/21  6:18 AM  Result Value Ref Range   TSH 1.730 0.350 - 4.500 uIU/mL    Comment: Performed by a 3rd Generation assay with a functional sensitivity of <=0.01 uIU/mL. Performed at [facility], [place] [address],  [place], [place] [address]     Blood Alcohol level:  Lab Results  Component Value Date   ETH <10 02/27/2021   ETH <10 45/80/9983    Metabolic Disorder Labs: Lab Results  Component Value Date   HGBA1C <4.2 (L) 03/01/2021   MPG <74 03/01/2021   No results found for: PROLACTIN Lab Results  Component Value Date   CHOL 127 03/01/2021   TRIG 89 03/01/2021   HDL 43 03/01/2021   CHOLHDL 3.0 03/01/2021   VLDL 18 03/01/2021   LDLCALC 66 03/01/2021   LDLCALC 60 06/04/2012    Physical Findings: AIMS: Facial and Oral Movements Muscles of Facial Expression: None, normal Lips and Perioral Area: None, normal Jaw: None, normal Tongue: None, normal,Extremity Movements Upper (arms, wrists, hands, fingers): None, normal Lower (legs, knees, ankles, toes): None, normal, Trunk Movements Neck, shoulders, hips: None, normal, Overall Severity Severity of abnormal movements (highest score from questions above): None, normal Incapacitation due to abnormal movements: None, normal Patient's awareness of abnormal movements (rate only patient's report): No Awareness, Dental Status Current problems with teeth and/or dentures?: No Does patient usually wear dentures?: No  CIWA:  CIWA-Ar Total: 0 COWS:     Musculoskeletal: Strength & Muscle Tone: within normal limits Gait & Station: normal Patient leans:  N/A  Psychiatric Specialty Exam:  Presentation  General Appearance: Casual; Appropriate for Environment; Other (comment)  Eye Contact:Good  Speech:Clear and Coherent; Normal Rate  Speech Volume:Normal  Handedness:Right   Mood and Affect  Mood:Depressed  Affect:Congruent   Thought Process  Thought Processes:Coherent; Goal Directed  Descriptions of Associations:Intact  Orientation:Full (Time, Place and Person)  Thought Content:Logical; Perseveration; Rumination (Perseveration and rumination regarding negative interactions with probation officer)  History of  Schizophrenia/Schizoaffective disorder:No  Duration of Psychotic Symptoms:No data recorded Hallucinations:Hallucinations: None  Ideas of Reference:None  Suicidal Thoughts:Suicidal Thoughts: Yes, Passive  Homicidal Thoughts:Homicidal Thoughts: No   Sensorium  Memory:Immediate Good; Recent Good; Remote Good  Judgment:Fair  Insight:Fair   Executive Functions  Concentration:Good  Attention Span:Good  [place] of Knowledge:Good  Language:Good   Psychomotor Activity  Psychomotor Activity:Psychomotor Activity: Normal   Assets  Assets:Communication Skills; Desire for Improvement; Leisure Time; Physical Health; Resilience   Sleep  Sleep:Sleep: Fair Number of Hours of Sleep: 6    Physical Exam: Physical Exam Vitals and nursing note reviewed.  HENT:     Head: Normocephalic and atraumatic.  Pulmonary:     Effort: Pulmonary effort is normal.  Neurological:     General: No focal deficit present.     Mental Status: He is alert and oriented to person, place, and time.    ROS Blood pressure 124/77, pulse 66, temperature 98 F (36.7 C), temperature source Oral, resp. rate 16, height 6' 3"  (1.905 m), weight 70.8 kg, SpO2 98 %. Body mass index is 19.5 kg/m.   Treatment Plan Summary: Daily contact with patient to assess and evaluate symptoms and progress in treatment and Medication management   Encourage participation in group therapy and therapeutic milieu  Continue every 15-minute observation status  Depression/anxiety -Start Lexapro 5 mg daily (initiated today 03/02/2021) for mood and anxiety symptoms -Continue hydroxyzine 25 mg 3 times daily PRN anxiety  Insomnia -Continue trazodone 50 mg at bedtime PRN  Substance use disorder -Would benefit from participation in substance abuse treatment program after discharge if he is willing to attend.  EKG performed 03/01/2021 reviewed and revealed sinus bradycardia; moderate voltage criteria for LVH but  may be normal variant; septal infarct, age undetermined.  Ventricular rate of 51 and QT/QTc of 462/425.  Disposition planning is in progress  Arthor Captain, MD 03/02/2021, 3:58 PM

## 2021-03-02 NOTE — BHH Counselor (Signed)
Adult Comprehensive Assessment  Patient ID: Scott Norton, male   DOB: Jun 25, 1966, 55 y.o.   MRN: [id number]  Information Source: Information source: Patient  Current Stressors:  Patient states their primary concerns and needs for treatment are:: "I am homeless and on probation" Patient states their goals for this hospitilization and ongoing recovery are:: "To find housing" Educational / Learning stressors: Pt reports having a G.E.D. and some college Employment / Job issues: Pt reports being on disability since 1987 Family Relationships: Pt reports some conflict with his mother Surveyor, quantity / Lack of resources (include bankruptcy): Pt reports receiving SSDI Housing / Lack of housing: Pt reports being homeless Physical health (include injuries & life threatening diseases): Pt reports having Sickle Cell Social relationships: Pt reports few social relationships Substance abuse: Pt reports using Cocaine twice a week and drinking alcohol socially Bereavement / Loss: Pt reports father passed away 47 years ago and aunt passed in 2000  Living/Environment/Situation:  Living Arrangements: Alone Living conditions (as described by patient or guardian): "It's not the best because I've been staying in hotels" Who else lives in the home?: No one How long has patient lived in current situation?: 6 months What is atmosphere in current home: Dangerous,Temporary  Family History:  Marital status: Single Are you sexually active?: No What is your sexual orientation?: Heterosexual Has your sexual activity been affected by drugs, alcohol, medication, or emotional stress?: No Does patient have children?: Yes How many children?: 3 How is patient's relationship with their children?: Pt reports oldest daughter lives in [place] and 2 other children are in foster care  Childhood History:  By whom was/is the patient raised?: Adoptive parents Additional childhood history information: Pt reports his father passed 75  years ago and aunt adopted Pt at the age of 40 (Pt refers to aunt as mother) Description of patient's relationship with caregiver when they were a child: "It was great with my aunt" Patient's description of current relationship with people who raised him/her: "My aunt passed in 70 and I dont talk to my biological mother much" How were you disciplined when you got in trouble as a child/adolescent?: Spankings Does patient have siblings?: Yes Number of Siblings: 6 Description of patient's current relationship with siblings: "We dont have much contact" Did patient suffer any verbal/emotional/physical/sexual abuse as a child?: No Did patient suffer from severe childhood neglect?: No Has patient ever been sexually abused/assaulted/raped as an adolescent or adult?: No Was the patient ever a victim of a crime or a disaster?: No Witnessed domestic violence?: No Has patient been affected by domestic violence as an adult?: No  Education:  Highest grade of school patient has completed: Pt reports having a G.E.D. and some college Currently a student?: No Learning disability?: No  Employment/Work Situation:   Employment situation: On disability Why is patient on disability: Sickle Cell How long has patient been on disability: Since 1987 Patient's job has been impacted by current illness: No What is the longest time patient has a held a job?: 2 years Where was the patient employed at that time?: [employer] Has patient ever been in the [employer]?: No  Financial Resources:   Financial resources: Counselling psychologist Does patient have a Lawyer or guardian?: No  Alcohol/Substance Abuse:   What has been your use of drugs/alcohol within the last 12 months?: Pt reports using Cocaine twice a week and drinking alcohol socially If attempted suicide, did drugs/alcohol play a role in this?: No Alcohol/Substance Abuse Treatment Hx: Denies past history Has  alcohol/substance abuse ever caused  legal problems?: Yes (Pt is on probation for possession charges)  Social Support System:   Patient's Community Support System: None Describe Community Support System: "My self" Type of faith/religion: None How does patient's faith help to cope with current illness?: None  Leisure/Recreation:   Do You Have Hobbies?: Yes Leisure and Hobbies: Watching Tv and playing video games  Strengths/Needs:   What is the patient's perception of their strengths?: Painting and making music Patient states they can use these personal strengths during their treatment to contribute to their recovery: "It gives me something to do" Patient states these barriers may affect/interfere with their treatment: Homelessness Patient states these barriers may affect their return to the community: None Other important information patient would like considered in planning for their treatment: None  Discharge Plan:   Currently receiving community mental health services: No Patient states concerns and preferences for aftercare planning are: Pt is interested in substance use treatment and outpatient follow up Patient states they will know when they are safe and ready for discharge when: "When I can get some housing" Does patient have access to transportation?: Yes (Bus) Does patient have financial barriers related to discharge medications?: No Plan for living situation after discharge: Shelter Will patient be returning to same living situation after discharge?: No  Summary/Recommendations:   Summary and Recommendations (to be completed by the evaluator): Scott Norton is a 55 year old, AA, male who was admitted to the hospital due to SI, worsening depression, and homelessness.  The Pt reports that he is homeless and has been staying in extended stay hotels.  The Pt reports that he receives SSDI for his Sickle Cell and has been on disability since 02/10/1986.  The Pt reports that his father passed away 47 years ago and his mother  was unable to care for him so his aunt adopted him.  The aunt passed away in February 11, 1999.  The Pt has little family contact except with his oldest daughter who lives in [place].  The Pt reports using Cocaine twice a week and drinking alcohol socially.  The Pt reports his major stressors are being homeless and being on probation for several charges dealing with the possession of a controlled substance.  The Pt reports that he is interested in a 30 day residential treatment program but is a registered sex offender and is not allowed to attend certain places due to the proxemity of nearby churches and schools.  While in the hospital the Pt can benefit from crisis stabilization, medication evaluation, group therapy, psycho-education, case management, and discharge plannnig.  Upon discharge the Pt would like to attend a residential treatment program or a shelter and would like to have outpatient follow up for therapy and medication management.  Scott Norton. 03/02/2021

## 2021-03-02 NOTE — BHH Counselor (Signed)
CSW provided the patient with a homelessness packet that contains resources for: housing, free or reduced food, GoodRX cards, and suicide prevention.  CSW will follow up with the patient to see if these resources were helpful.

## 2021-03-02 NOTE — Progress Notes (Signed)
Adult Psychoeducational Group Note  Date:  03/02/2021 Time:  2:03 PM  Group Topic/Focus:  Goals Group:   The focus of this group is to help patients establish daily goals to achieve during treatment and discuss how the patient can incorporate goal setting into their daily lives to aide in recovery. Managing Feelings:   The focus of this group is to identify what feelings patients have difficulty handling and develop a plan to handle them in a healthier way upon discharge.  Participation Level:  Active  Participation Quality:  Appropriate  Affect:  Appropriate  Cognitive:  Appropriate  Insight: Appropriate  Engagement in Group:  Engaged  Modes of Intervention:  Discussion  Additional Comments:  Pt attended goals/psycho-ed group and participated in discussion.  Jonette Wassel R Dosia Yodice 03/02/2021, 2:03 PM

## 2021-03-03 DIAGNOSIS — F1414 Cocaine abuse with cocaine-induced mood disorder: Secondary | ICD-10-CM | POA: Diagnosis not present

## 2021-03-03 LAB — COMPREHENSIVE METABOLIC PANEL
ALT: 27 U/L (ref 0–44)
AST: 45 U/L — ABNORMAL HIGH (ref 15–41)
Albumin: 4 g/dL (ref 3.5–5.0)
Alkaline Phosphatase: 200 U/L — ABNORMAL HIGH (ref 38–126)
Anion gap: 5 (ref 5–15)
BUN: 12 mg/dL (ref 6–20)
CO2: 27 mmol/L (ref 22–32)
Calcium: 9.2 mg/dL (ref 8.9–10.3)
Chloride: 105 mmol/L (ref 98–111)
Creatinine, Ser: 0.92 mg/dL (ref 0.61–1.24)
GFR, Estimated: >60 mL/min (ref >60)
Glucose, Bld: 111 mg/dL — ABNORMAL HIGH (ref 70–99)
Potassium: 4.2 mmol/L (ref 3.5–5.1)
Sodium: 137 mmol/L (ref 135–145)
Total Bilirubin: 1.6 mg/dL — ABNORMAL HIGH (ref 0.3–1.2)
Total Protein: 7.1 g/dL (ref 6.5–8.1)

## 2021-03-03 NOTE — BHH Group Notes (Signed)
Type of Therapy and Topic:  Group Therapy - Healthy vs Unhealthy Coping Skills  Participation Level:  Active   Description of Group The focus of this group was to determine what unhealthy coping techniques typically are used by group members and what healthy coping techniques would be helpful in coping with various problems. Patients were guided in becoming aware of the differences between healthy and unhealthy coping techniques. Patients were asked to identify 2-3 healthy coping skills they would like to learn to use more effectively.  Therapeutic Goals 1. Patients learned that coping is what human beings do all day long to deal with various situations in their lives 2. Patients defined and discussed healthy vs unhealthy coping techniques 3. Patients identified their preferred coping techniques and identified whether these were healthy or unhealthy 4. Patients determined 2-3 healthy coping skills they would like to become more familiar with and use more often. 5. Patients provided support and ideas to each other   Summary of Patient Progress:  Nyzaiah attended group, interacted appropriately during peer discussions, and accepted the worksheets that were provided.

## 2021-03-03 NOTE — Progress Notes (Signed)
Recreation Therapy Notes  Date:  5.13.22 Time: 0930 Location: [address] Dayroom  Group Topic: Stress Management  Goal Area(s) Addresses:  Patient will identify positive stress management techniques. Patient will identify benefits of using stress management post d/c.  Intervention: Stress Management  Activity :  Meditation.  LRT played a meditation that focused on making the most of each opportunity we're given.  The meditation went on to talk about each breath being a fresh start and each day a blank page to start over.  Education:  Stress Management, Discharge Planning.   Education Outcome: Acknowledges Education  Clinical Observations/Feedback: Pt did not attend group session.   Caroll Rancher, LRT/CTRS         Caroll Rancher A 03/03/2021 11:25 AM

## 2021-03-03 NOTE — Progress Notes (Signed)
The patient rated his day as a 6 out of 10. The patient openly bragged about his telephone call with his probation officer and tried to elicit a reaction from his peers. His goal for tomorrow is to stay positive.

## 2021-03-03 NOTE — Progress Notes (Signed)
PT attended wrap up group. He got to speak someone about housing today and found something affordable. His goal is to relax and continue call about housing, favorite places is between the [place] and [place].

## 2021-03-03 NOTE — Progress Notes (Signed)
[facility] MD Progress Note  03/03/2021 3:01 PM Scott Norton  MRN:  [id number]   Subjective: Scott Norton reports, "I'm a little discouraged today. Just trying to find a place to stay after discharge or I'm going out on the streets, sleeping under the bridge, start selling drugs again, may be to an undercover cop & go back to prison. I'm trying very hard to avoid all that because I already have an ankle monitor My parole officer talks to me like I'm nothing. It is hard. I have tried every # number on the these lists of possible places to go to, but the numbers are all wrong. But, my mental health is a little better because I'm here & there are people to talk to from time to time. It is hard out there. I miss my mama. I would not be going through all these if Scott Norton is still alive. I need a new list of places & right numbers from the social worker. Can you help me tell her. Other than this, I'm okay"  Reason for admission: Scott Norton is a 55 year old male with a history of cocaine abuse, sickle cell disease and chronic pain who presented to [facility] ED voluntarily reporting worsening depressive symptoms and suicidal ideation with thoughts of jumping off a bridge.    Objective: Medical record reviewed.  Patient's case discussed in detail with members of the treatment team.  I met with and evaluated the patient today for follow-up on the unit. Patient had difficulty managing his irritation over interactions with his probation officer because of the way she talks to him in a very degrading manner. However, he says he is feeling better being because he has the staff to talk to from time to time.  Patient reports that he has felt better in the hospital and has been eating and sleeping okay.  He denies suicidal ideation in the hospital but worries that his hopelessness will return and increase after discharge if his housing and legal stressors persist.  He is concerned that he would be suicidal outside the hospital if he  does not find suitable housing to move into after discharge.  He denies AI or HI.  He denies any psychotic symptoms.  Patient has been able to enjoy some interactions with peers and staff on the unit.  He perseverates on phone call that he had with his probation officer yesterday that went poorly and feels she significantly adversely affects his mood.  He denies any side effects to his medications or any physical problems. He says he was given lists of possible housing to call but the listed phone numbers were not right or disconnected. The social worker is made aware. The patient slept 6 hours last night.  There are no new labs. He has been attending and participating in groups.  He has been anxious and expressed concern to staff at times that probation officer is purposely trying to get patient sent back to prison.  Vital signs this morning include BP of 163/98 sitting standing, pulse of 67 standing, O2 sat of 98%, respirations of 16 and temperature of 98. Rechecked BP: 129/71 & P. 65.  Principal Problem: Cocaine abuse with cocaine-induced mood disorder (HCC) Diagnosis: Principal Problem:   Cocaine abuse with cocaine-induced mood disorder (HCC) Active Problems:   Sickle cell anemia (HCC)   Tobacco use   Homelessness   Cocaine abuse ([place])   Depression  Total Time spent with patient: 15 minutes  Past Psychiatric History: See admission H&P  Past Medical History:  Past Medical History:  Diagnosis Date  . AKI (acute kidney injury) ([place]) 02/08/2021  . Anemia   . Depression   . Heart murmur   . Seasonal allergies    wool & grass  . Sickle cell anemia (HCC)     Past Surgical History:  Procedure Laterality Date  . NO PAST SURGERIES    . ROOT CANAL     Family History:  Family History  Problem Relation Age of Onset  . Alcohol abuse Mother   . Sickle cell trait Mother   . Hypertension Mother   . Alcohol abuse Father   . Sickle cell trait Father   . Early death Father        due to  alcoholism  . Hypertension Father   . Hypertension Sister   . Hypertension Brother   . Hypertension Maternal Grandmother   . Hypertension Maternal Grandfather   . Hypertension Paternal Grandmother   . Hypertension Paternal Grandfather    Family Psychiatric  History: See admission H&P Social History:  Social History   Substance and Sexual Activity  Alcohol Use Yes   Comment: occasional but indicated etoh abuse on health hx questionaire     Social History   Substance and Sexual Activity  Drug Use Yes  . Types: Marijuana, Cocaine    Social History   Socioeconomic History  . Marital status: Single    Spouse name: Not on file  . Number of children: Not on file  . Years of education: Not on file  . Highest education level: Not on file  Occupational History  . Not on file  Tobacco Use  . Smoking status: Current Every Day Smoker    Packs/day: 0.50    Types: Cigars  . Smokeless tobacco: Never Used  Vaping Use  . Vaping Use: Never used  Substance and Sexual Activity  . Alcohol use: Yes    Comment: occasional but indicated etoh abuse on health hx questionaire  . Drug use: Yes    Types: Marijuana, Cocaine  . Sexual activity: Yes  Other Topics Concern  . Not on file  Social History Narrative   Lives with Friends in [place]. Working on housing for himself   On disability for his SS disease      No children, no stable relationship   Social Determinants of Radio broadcast assistant Strain: Not on file  Food Insecurity: Not on file  Transportation Needs: Not on file  Physical Activity: Not on file  Stress: Not on file  Social Connections: Not on file   Additional Social History:    History of alcohol / drug use?: Yes Negative Consequences of Use: Financial,Legal Name of Substance 1: cocaine 1 - Amount (size/oz): varies 1 - Frequency: varies Name of Substance 2: THC 2 - Amount (size/oz): varies 2 - Frequency: varies  Sleep: Fair  Appetite:  Good  Current  Medications: Current Facility-Administered Medications  Medication Dose Route Frequency Provider Last Rate Last Admin  . acetaminophen (TYLENOL) tablet 650 mg  650 mg Oral Q6H PRN Revonda Humphrey, NP   650 mg at 03/02/21 2049  . alum & mag hydroxide-simeth (MAALOX/MYLANTA) 200-200-20 MG/5ML suspension 30 mL  30 mL Oral Q4H PRN Revonda Humphrey, NP      . escitalopram (LEXAPRO) tablet 5 mg  5 mg Oral Daily Arthor Captain, MD   5 mg at 03/03/21 0743  . hydrOXYzine (ATARAX/VISTARIL) tablet 25 mg  25 mg Oral TID PRN  Revonda Humphrey, NP   25 mg at 03/02/21 2049  . magnesium hydroxide (MILK OF MAGNESIA) suspension 30 mL  30 mL Oral Daily PRN Revonda Humphrey, NP      . multivitamin (PROSIGHT) tablet 1 tablet  1 tablet Oral Daily Arthor Captain, MD      . nicotine polacrilex (NICORETTE) gum 2 mg  2 mg Oral PRN White, Patrice L, NP   2 mg at 03/01/21 2141  . traZODone (DESYREL) tablet 50 mg  50 mg Oral QHS PRN Revonda Humphrey, NP   50 mg at 03/02/21 2049    Lab Results:  No results found for this or any previous visit (from the past 48 hour(s)).  Blood Alcohol level:  Lab Results  Component Value Date   ETH <10 02/27/2021   ETH <10 31/49/7026    Metabolic Disorder Labs: Lab Results  Component Value Date   HGBA1C <4.2 (L) 03/01/2021   MPG <74 03/01/2021   No results found for: PROLACTIN Lab Results  Component Value Date   CHOL 127 03/01/2021   TRIG 89 03/01/2021   HDL 43 03/01/2021   CHOLHDL 3.0 03/01/2021   VLDL 18 03/01/2021   LDLCALC 66 03/01/2021   LDLCALC 60 06/04/2012   Physical Findings: AIMS: Facial and Oral Movements Muscles of Facial Expression: None, normal Lips and Perioral Area: None, normal Jaw: None, normal Tongue: None, normal,Extremity Movements Upper (arms, wrists, hands, fingers): None, normal Lower (legs, knees, ankles, toes): None, normal, Trunk Movements Neck, shoulders, hips: None, normal, Overall Severity Severity of abnormal movements  (highest score from questions above): None, normal Incapacitation due to abnormal movements: None, normal Patient's awareness of abnormal movements (rate only patient's report): No Awareness, Dental Status Current problems with teeth and/or dentures?: No Does patient usually wear dentures?: No  CIWA:  CIWA-Ar Total: 0 COWS:     Musculoskeletal: Strength & Muscle Tone: within normal limits Gait & Station: normal Patient leans: N/A  Psychiatric Specialty Exam:  Presentation  General Appearance: Casual; Appropriate for Environment; Other (comment)  Eye Contact:Good  Speech:Clear and Coherent; Normal Rate  Speech Volume:Normal  Handedness:Right  Mood and Affect  Mood:Depressed  Affect:Congruent  Thought Process  Thought Processes:Coherent; Goal Directed  Descriptions of Associations:Intact  Orientation:Full (Time, Place and Person)  Thought Content:Logical; Perseveration; Rumination (Perseveration and rumination regarding negative interactions with probation officer)  History of Schizophrenia/Schizoaffective disorder:No  Duration of Psychotic Symptoms:No data recorded Hallucinations:Hallucinations: None  Ideas of Reference:None  Suicidal Thoughts:Suicidal Thoughts: Yes, Passive  Homicidal Thoughts:Homicidal Thoughts: No  Sensorium  Memory:Immediate Good; Recent Good; Remote Good  Judgment:Fair  Insight:Fair  Executive Functions  Concentration:Good  Attention Span:Good  [place] of Knowledge:Good  Language:Good  Psychomotor Activity  Psychomotor Activity:Psychomotor Activity: Normal  Assets  Assets:Communication Skills; Desire for Improvement; Leisure Time; Physical Health; Resilience  Sleep  Sleep:Sleep: Fair Number of Hours of Sleep: 6  Physical Exam: Physical Exam Vitals and nursing note reviewed.  HENT:     Head: Normocephalic and atraumatic.  Pulmonary:     Effort: Pulmonary effort is normal.  Neurological:     General:  No focal deficit present.     Mental Status: He is alert and oriented to person, place, and time.    Review of Systems  Constitutional: Negative.   HENT: Negative.   Eyes: Negative.   Skin: Negative.    Blood pressure 129/71, pulse 65, temperature 98 F (36.7 C), temperature source Oral, resp. rate 16, height 6' 3"  (1.905 m),  weight 70.8 kg, SpO2 98 %. Body mass index is 19.5 kg/m.  Treatment Plan Summary: Daily contact with patient to assess and evaluate symptoms and progress in treatment and Medication management.   Continue inpatient hospitalization. Will continue today 03/03/2021 plan as below except where it is noted.  Encourage participation in group therapy and therapeutic milieu  Continue every 15-minute observation status  Depression/anxiety -ContinueLexapro 5 mg daily (initiated on 03/02/2021) for mood and anxiety symptoms -Continue hydroxyzine 25 mg 3 times daily PRN anxiety  Insomnia -Continue trazodone 50 mg at bedtime PRN  Substance use disorder -Would benefit from participation in substance abuse treatment program after discharge if he is willing to attend.  EKG performed 03/01/2021 reviewed and revealed sinus bradycardia; moderate voltage criteria for LVH but may be normal variant; septal infarct, age undetermined.  Ventricular rate of 51 and QT/QTc of 462/425.  Disposition planning is in progress  Lindell Spar, NP, [place] 03/03/2021, 3:01 PMPatient ID: Darliss Cheney, male   DOB: 1966/07/22, 55 y.o.   MRN: [id number]

## 2021-03-03 NOTE — Progress Notes (Signed)
   03/03/21 1400  Psych Admission Type (Psych Patients Only)  Admission Status Voluntary  Psychosocial Assessment  Patient Complaints Anxiety  Eye Contact Fair  Facial Expression Animated  Affect Appropriate to circumstance  Speech Logical/coherent  Interaction Assertive  Motor Activity Fidgety  Appearance/Hygiene Unremarkable  Behavior Characteristics Cooperative;Anxious  Mood Depressed;Anxious  Thought Process  Coherency WDL  Content WDL  Delusions None reported or observed  Perception WDL  Hallucination None reported or observed  Judgment WDL  Confusion None  Danger to Self  Current suicidal ideation? Denies  Self-Injurious Behavior No self-injurious ideation or behavior indicators observed or expressed   Agreement Not to Harm Self Yes  Description of Agreement verbal contract  Danger to Others  Danger to Others None reported or observed

## 2021-03-03 NOTE — Progress Notes (Signed)
   03/02/21 2049  Psych Admission Type (Psych Patients Only)  Admission Status Voluntary  Psychosocial Assessment  Patient Complaints Anxiety;Depression  Eye Contact Fair  Facial Expression Animated  Affect Appropriate to circumstance  Speech Logical/coherent  Interaction Arrogant;Assertive  Motor Activity Fidgety  Appearance/Hygiene Unremarkable  Behavior Characteristics Cooperative;Appropriate to situation  Mood Depressed  Thought Process  Coherency WDL  Content WDL  Delusions None reported or observed  Perception WDL  Hallucination None reported or observed  Judgment WDL  Confusion None  Danger to Self  Current suicidal ideation? Denies  Self-Injurious Behavior No self-injurious ideation or behavior indicators observed or expressed   Agreement Not to Harm Self Yes  Description of Agreement verbal contract  Danger to Others  Danger to Others None reported or observed

## 2021-03-04 DIAGNOSIS — F32A Depression, unspecified: Secondary | ICD-10-CM | POA: Diagnosis not present

## 2021-03-04 DIAGNOSIS — F1414 Cocaine abuse with cocaine-induced mood disorder: Secondary | ICD-10-CM | POA: Diagnosis not present

## 2021-03-04 NOTE — Progress Notes (Signed)
D: Patient denies SI/HI/AVH. Patient rated anxiety 7/10 and  Depression 6/10. Pt. Complained of back pain 7/10. PT out in open areas and was social with peers and staff.  A:  Patient took scheduled medicine.  Support and encouragement provided Routine safety checks conducted every 15 minutes. Patient  Informed to notify staff with any concerns.   R: Safety maintained.

## 2021-03-04 NOTE — Progress Notes (Signed)
[facility] Group Notes:  (Nursing/MHT/Case Management/Adjunct)  Date:  03/04/2021  Time:  10:45 PM  Type of Therapy:  Group Therapy  Participation Level:  Active  Participation Quality:  Intrusive  Affect:  Excited  Cognitive:  Appropriate  Insight:  Appropriate  Engagement in Group:  Supportive  Modes of Intervention:  Discussion  Summary of Progress/Problems:  Lorita Officer 03/04/2021, 10:45 PM

## 2021-03-04 NOTE — Progress Notes (Signed)
[facility]  03/04/2021 3:22 PM Scott Norton  MRN:  [id number]   Subjective: Scott Norton reports, "I'm a tired because I did not sleep good last night. They open my door every 15 minutes."  Reason for admission: Scott Norton is a 55 year old male with a history of cocaine abuse, sickle cell disease and chronic pain who presented to [facility], ED voluntarily reporting worsening depressive symptoms and suicidal ideation with thoughts of jumping off a bridge.    Objective: Patient is seen and evaluated, chart reviewed and case discussed with the treatment team. Patient is lying in bed in his room. He did not attend group this morning. Patient has difficulty managing his irritation over interactions with his probation officer because of the way she talks to him in a very degrading manner. He also has multiple complaints about the resource list he was given and the fact that nobody seems to be helping him. He stated he called a [facility] and they had a place with one room but they would not give him the address. he also complained that he would have to have a roommate. However, he says he is feeling better being because he has the staff to talk to from time to time.  Patient reports that he has felt better in the hospital and has been eating and sleeping okay. He denies SI/HI, AVH, paranoia and delusions. He denies any psychotic symptoms. His suicidal ideation appears to be situational in relation to the fact he does not like his probation officer and dealing with her makes him depressed. He feels she is purposely trying to send him back to prison. He is aware if he tests positive for drugs or alcohol his parole could be revoked. His UDS was positive for cocaine when he was admitted. He also is currently without a physical address and he is a registered sex offender.  He is taking his medication as prescribed and has no issues with the initiation of Lexapro.  Patient has been able to enjoy some  interactions with peers and staff on the unit. He complains of back pain today. Will continue to monitor for safety and encourage patient to find housing solution.   Principal Problem: Cocaine abuse with cocaine-induced mood disorder (HCC) Diagnosis: Principal Problem:   Cocaine abuse with cocaine-induced mood disorder (HCC) Active Problems:   Sickle cell anemia (HCC)   Tobacco use   Homelessness   Cocaine abuse (HCC)   Depression  Total Time spent with patient: 15 minutes  Past Psychiatric History: See admission H&P  Past Medical History:  Past Medical History:  Diagnosis Date  . AKI (acute kidney injury) (HCC) 02/08/2021  . Anemia   . Depression   . Heart murmur   . Seasonal allergies    wool & grass  . Sickle cell anemia (HCC)     Past Surgical History:  Procedure Laterality Date  . NO PAST SURGERIES    . ROOT CANAL     Family History:  Family History  Problem Relation Age of Onset  . Alcohol abuse Mother   . Sickle cell trait Mother   . Hypertension Mother   . Alcohol abuse Father   . Sickle cell trait Father   . Early death Father        due to alcoholism  . Hypertension Father   . Hypertension Sister   . Hypertension Brother   . Hypertension Maternal Grandmother   . Hypertension Maternal Grandfather   . Hypertension Paternal  Grandmother   . Hypertension Paternal Grandfather    Family Psychiatric  History: See admission H&P Social History:  Social History   Substance and Sexual Activity  Alcohol Use Yes   Comment: occasional but indicated etoh abuse on health hx questionaire     Social History   Substance and Sexual Activity  Drug Use Yes  . Types: Marijuana, Cocaine    Social History   Socioeconomic History  . Marital status: Single    Spouse name: Not on file  . Number of children: Not on file  . Years of education: Not on file  . Highest education level: Not on file  Occupational History  . Not on file  Tobacco Use  . Smoking status:  Current Every Day Smoker    Packs/day: 0.50    Types: Cigars  . Smokeless tobacco: Never Used  Vaping Use  . Vaping Use: Never used  Substance and Sexual Activity  . Alcohol use: Yes    Comment: occasional but indicated etoh abuse on health hx questionaire  . Drug use: Yes    Types: Marijuana, Cocaine  . Sexual activity: Yes  Other Topics Concern  . Not on file  Social History Narrative   Lives with Friends in [place]. Working on housing for himself   On disability for his SS disease      No children, no stable relationship   Social Determinants of Corporate investment banker Strain: Not on file  Food Insecurity: Not on file  Transportation Needs: Not on file  Physical Activity: Not on file  Stress: Not on file  Social Connections: Not on file   Additional Social History:    History of alcohol / drug use?: Yes Negative Consequences of Use: Financial,Legal Name of Substance 1: cocaine 1 - Amount (size/oz): varies 1 - Frequency: varies Name of Substance 2: THC 2 - Amount (size/oz): varies 2 - Frequency: varies  Sleep: Fair  Appetite:  Good  Current Medications: Current Facility-Administered Medications  Medication Dose Route Frequency Provider Last Rate Last Admin  . acetaminophen (TYLENOL) tablet 650 mg  650 mg Oral Q6H PRN Ardis Hughs, NP   650 mg at 03/04/21 4854  . alum & mag hydroxide-simeth (MAALOX/MYLANTA) 200-200-20 MG/5ML suspension 30 mL  30 mL Oral Q4H PRN Ardis Hughs, NP      . escitalopram (LEXAPRO) tablet 5 mg  5 mg Oral Daily Claudie Revering, MD   5 mg at 03/04/21 6270  . hydrOXYzine (ATARAX/VISTARIL) tablet 25 mg  25 mg Oral TID PRN Ardis Hughs, NP   25 mg at 03/04/21 0823  . magnesium hydroxide (MILK OF MAGNESIA) suspension 30 mL  30 mL Oral Daily PRN Ardis Hughs, NP      . multivitamin (PROSIGHT) tablet 1 tablet  1 tablet Oral Daily Claudie Revering, MD   1 tablet at 03/04/21 3500  . nicotine polacrilex (NICORETTE) gum 2 mg   2 mg Oral PRN Liborio Nixon L, NP   2 mg at 03/03/21 2121  . traZODone (DESYREL) tablet 50 mg  50 mg Oral QHS PRN Ardis Hughs, NP   50 mg at 03/03/21 2119    Lab Results:  Results for orders placed or performed during the hospital encounter of 03/01/21 (from the past 48 hour(s))  Comprehensive metabolic panel     Status: Abnormal   Collection Time: 03/03/21  6:28 PM  Result Value Ref Range   Sodium 137 135 - 145 mmol/L  Potassium 4.2 3.5 - 5.1 mmol/L   Chloride 105 98 - 111 mmol/L   CO2 27 22 - 32 mmol/L   Glucose, Bld 111 (H) 70 - 99 mg/dL    Comment: Glucose reference range applies only to samples taken after fasting for at least 8 hours.   BUN 12 6 - 20 mg/dL   Creatinine, Ser 1.61 0.61 - 1.24 mg/dL   Calcium 9.2 8.9 - 09.6 mg/dL   Total Protein 7.1 6.5 - 8.1 g/dL   Albumin 4.0 3.5 - 5.0 g/dL   AST 45 (H) 15 - 41 U/L   ALT 27 0 - 44 U/L   Alkaline Phosphatase 200 (H) 38 - 126 U/L   Total Bilirubin 1.6 (H) 0.3 - 1.2 mg/dL   GFR, Estimated >04 >54 mL/min    Comment: (NOTE) Calculated using the CKD-EPI Creatinine Equation (2021)    Anion gap 5 5 - 15    Comment: Performed at [facility], [address], [place], [place] [address]    Blood Alcohol level:  Lab Results  Component Value Date   ETH <10 02/27/2021   ETH <10 02/07/2021    Metabolic Disorder Labs: Lab Results  Component Value Date   HGBA1C <4.2 (L) 03/01/2021   MPG <74 03/01/2021   No results found for: PROLACTIN Lab Results  Component Value Date   CHOL 127 03/01/2021   TRIG 89 03/01/2021   HDL 43 03/01/2021   CHOLHDL 3.0 03/01/2021   VLDL 18 03/01/2021   LDLCALC 66 03/01/2021   LDLCALC 60 06/04/2012   Physical Findings: AIMS: Facial and Oral Movements Muscles of Facial Expression: None, normal Lips and Perioral Area: None, normal Jaw: None, normal Tongue: None, normal,Extremity Movements Upper (arms, wrists, hands, fingers): None, normal Lower (legs, knees, ankles,  toes): None, normal, Trunk Movements Neck, shoulders, hips: None, normal, Overall Severity Severity of abnormal movements (highest score from questions above): None, normal Incapacitation due to abnormal movements: None, normal Patient's awareness of abnormal movements (rate only patient's report): No Awareness, Dental Status Current problems with teeth and/or dentures?: No Does patient usually wear dentures?: No  CIWA:  CIWA-Ar Total: 0 COWS:     Musculoskeletal: Strength & Muscle Tone: within normal limits Gait & Station: normal Patient leans: N/A  Psychiatric Specialty Exam:  Presentation  General Appearance: Casual; Appropriate for Environment; Other (comment)  Eye Contact:Good  Speech:Clear and Coherent; Normal Rate  Speech Volume:Normal  Handedness:Right  Mood and Affect  Mood:Depressed  Affect:Congruent  Thought Process  Thought Processes:Coherent; Goal Directed  Descriptions of Associations:Intact  Orientation:Full (Time, Place and Person)  Thought Content:Logical; Perseveration; Rumination (Perseveration and rumination regarding negative interactions with probation officer)  History of Schizophrenia/Schizoaffective disorder:No  Duration of Psychotic Symptoms:No data recorded Hallucinations:No data recorded  Ideas of Reference:None  Suicidal Thoughts:No data recorded  Homicidal Thoughts:No data recorded  Sensorium  Memory:Immediate Good; Recent Good; Remote Good  Judgment:Fair  Insight:Fair  Executive Functions  Concentration:Good  Attention Span:Good  Recall:Good  Fund of Knowledge:Good  Language:Good  Psychomotor Activity  Psychomotor Activity:No data recorded  Assets  Assets:Communication Skills; Desire for Improvement; Leisure Time; Physical Health; Resilience  Sleep  Sleep:No data recorded  Physical Exam: Physical Exam Vitals and nursing note reviewed.  HENT:     Head: Normocephalic and atraumatic.  Pulmonary:      Effort: Pulmonary effort is normal.  Neurological:     General: No focal deficit present.     Mental Status: He is alert and oriented to  person, place, and time.    Review of Systems  Constitutional: Negative.   HENT: Negative.   Eyes: Negative.   Skin: Negative.    Blood pressure 129/71, pulse 65, temperature 98 F (36.7 C), temperature source Oral, resp. rate 16, height 6\' 3"  (1.905 m), weight 70.8 kg, SpO2 98 %. Body mass index is 19.5 kg/m.  Treatment Plan Summary: Daily contact with patient to assess and evaluate symptoms and progress in treatment and Medication management.   Continue inpatient hospitalization. Will continue today 03/04/2021 plan as below except where it is noted.  Encourage participation in group therapy and therapeutic milieu  Continue every 15-minute observation status  Depression/anxiety -ContinueLexapro 5 mg daily (initiated on 03/02/2021) for mood and anxiety symptoms -Continue hydroxyzine 25 mg 3 times daily PRN anxiety  Insomnia -Continue trazodone 50 mg at bedtime PRN  Substance use disorder -Would benefit from participation in substance abuse treatment program after discharge if he is willing to attend.  EKG performed 03/01/2021 reviewed and revealed sinus bradycardia; moderate voltage criteria for LVH but may be normal variant; septal infarct, age undetermined.  Ventricular rate of 51 and QT/QTc of 462/425.  Disposition planning is in progress  05/01/2021, NP 03/04/2021, 3:22 PM

## 2021-03-04 NOTE — [facility] Group Notes (Signed)
[facility] LCSW Group Therapy Note  Date/Time:    03/04/2021 11:00AM-12:00PM  Type of Therapy and Topic:  Group Therapy:  Healthy vs Unhealthy Coping Skills  Participation Level:  Active   Description of Group:  The focus of this group was to determine what unhealthy and healthy coping techniques typically are used by group members and why they tend to fall back on those particular techniques of handling hard situations. Patients were guided in becoming aware of the differences between healthy and unhealthy coping techniques.  Facilitator led a discussion about the benefits and costs of returning to old unhealthy coping techniques, as well as the benefits and costs of learning new healthier coping skills.  Therapeutic Goals 1. Patients learned that coping is what human beings do all day long to deal with various situations in their lives 2. Patients defined and discussed healthy vs unhealthy coping techniques 3. Patients came to understand the the reasons human beings often return to old coping techniques that they know are unhelpful 4. Patients were provided with motivation to consider new means of coping that may be more healthy and helpful 5. Patients provided support and ideas to each other  Summary of Patient Progress: During group, patient expressed that his unhealthy coping skill is often to confront people and fight physically, while the healthy choice is to take time away from the situation by walking away.  He related well to some of the other patients and was able to express this appropriately.  He could recognize that this is unhealthy even when it actually feels good.   Therapeutic Modalities Psychoeducation Motivational Interviewing   Ambrose Mantle, LCSW 03/04/2021, 4:32 PM

## 2021-03-04 NOTE — [facility] Group Notes (Signed)
.  Psychoeducational Group Note  Date:03/04/2021 Time: 0900-1000    Goal Setting   Purpose of Group: This group helps to provide patients with the steps of setting a goal that is specific, measurable, attainable, realistic and time specific. A discussion on how we keep ourselves stuck with negative self talk.    Participation Level:  Did not attend   Zale Marcotte A  

## 2021-03-04 NOTE — [facility] Group Notes (Signed)
.  Psychoeducational Group Note    Date:03/04/2021 Time: 1300-1400    Life Skills:  A group where two lists are made. What people need and what are things that we do that are healthy. The lists are developed by the patients and it is explained that we often do the actions that are not healthy to get our list of needs met.   Purpose of Group: . The group focus' on teaching patients on how to identify their needs and how to develop the coping skills needed to get their needs met  Participation Level:  Did not attend   Scott Norton

## 2021-03-04 NOTE — [facility] Suicide Risk Assessment (Signed)
[facility] INPATIENT:  Family/Significant Other Suicide Prevention Education  Suicide Prevention Education:  Contact Attempts [place] [phone] Psychologist, educational) has been identified by the patient as the family member/significant other with whom the patient will be residing, and identified as the person(s) who will aid the patient in the event of a mental health crisis.  With written consent from the patient, two attempts were made to provide suicide prevention education, prior to and/or following the patient's discharge.  We were unsuccessful in providing suicide prevention education.  A suicide education pamphlet was given to the patient to share with family/significant other.  Date and time of first attempt:  03/03/2021/11:56am Date and time of second attempt:03/04/2021 /11:09am - HIPAA-compliant VM left  Lynnell Chad 03/04/2021, 11:10 AM

## 2021-03-05 DIAGNOSIS — F32A Depression, unspecified: Secondary | ICD-10-CM | POA: Diagnosis not present

## 2021-03-05 DIAGNOSIS — F1414 Cocaine abuse with cocaine-induced mood disorder: Secondary | ICD-10-CM | POA: Diagnosis not present

## 2021-03-05 NOTE — Progress Notes (Signed)
   03/05/21 0600  Psych Admission Type (Psych Patients Only)  Admission Status Voluntary  Psychosocial Assessment  Eye Contact Fair  Facial Expression Flat  Affect Appropriate to circumstance  Speech Logical/coherent  Interaction Assertive  Motor Activity Fidgety  Appearance/Hygiene Unremarkable  Thought Process  Coherency WDL  Content WDL  Delusions None reported or observed  Perception WDL  Hallucination None reported or observed  Judgment WDL  Confusion None  Danger to Self  Current suicidal ideation? Denies  Self-Injurious Behavior No self-injurious ideation or behavior indicators observed or expressed   Agreement Not to Harm Self Yes  Description of Agreement vebral contract  Danger to Others  Danger to Others None reported or observed   D: Patient is alert and oriented x 4. Patient denies SI/HI/ AVH and pain. Disposition is Calm and cooperative with appropriate affect. Verbally contracts for safety to this Clinical research associate.   A:  Pt was given scheduled medications. Pt was encourage to attend groups. Q 15 minute checks were done for safety. Pt was visualized interacting appropriately with others in the milieu. Pt Pt was offered support and encouragement by this Clinical research associate. Pt is goal oriented and stated goal was reached for the day. Pt attended groups and interacts well with peers and staff. Pt is taking medication. Pt complied with scheduled medications. No signs of distress nor concerns reported by patient at present.Pt remains receptive to treatment and safety maintained on unit.    R: Will continue to monitor and assess. Safety maintained during this shift.

## 2021-03-05 NOTE — Progress Notes (Deleted)
   03/05/21 0400  Psych Admission Type (Psych Patients Only)  Admission Status Voluntary  Psychosocial Assessment  Eye Contact Fair  Facial Expression Flat  Affect Appropriate to circumstance  Speech Logical/coherent  Interaction Assertive  Motor Activity Fidgety  Appearance/Hygiene Unremarkable  Behavior Characteristics Cooperative  Mood Pleasant  Thought Process  Coherency WDL  Content WDL  Delusions None reported or observed  Perception WDL  Hallucination None reported or observed  Judgment WDL  Confusion None  Danger to Self  Current suicidal ideation? Denies  Self-Injurious Behavior No self-injurious ideation or behavior indicators observed or expressed   Agreement Not to Harm Self Yes  Description of Agreement vebral contract  Danger to Others  Danger to Others None reported or observed    D: Patient is alert and oriented x 4. Patient denies SI/HI/ AVH and pain. Disposition is Calm and cooperative with appropriate affect. Verbally contracts for safety to this Clinical research associate.   A:  Pt was given scheduled medications. Pt was encourage to attend groups. Q 15 minute checks were done for safety. Pt was visualized interacting appropriately with others in the milieu. Pt was offered support and encouragement by this Clinical research associate. Pt is goal oriented and stated goal was reached for the day. Pt attended groups and interacts well with peers and staff. Pt complied with scheduled medications. No signs of distress nor concerns reported by patient at present.Pt remains receptive to treatment and safety maintained on unit.    R: Will continue to monitor and assess. Safety maintained during this shift.

## 2021-03-05 NOTE — [facility] Group Notes (Signed)
Adult Psychoeducational Group Not Date:  03/05/2021 Time:  0900-1045 Group Topic/Focus: PROGRESSIVE RELAXATION. A group where deep breathing is taught and tensing and relaxation muscle groups is used. Imagery is used as well.  Pts are asked to imagine 3 pillars that hold them up when they are not able to hold themselves up.  Participation Level:  Active  Participation Quality:  Appropriate  Affect:  Appropriate  Cognitive:  Oriented  Insight: Improving  Engagement in Group:  Engaged  Modes of Intervention:  Activity, Discussion, Education, and Support  Additional Comments:  Rates his energy at a 4/10. Sttates what holds him up are his memories  Dione Housekeeper

## 2021-03-05 NOTE — Progress Notes (Signed)
[facility] Group Notes:  (Nursing/MHT/Case Management/Adjunct)  Date:  03/05/2021  Time:  2015  Type of Therapy:  wrap  up group  Participation Level:  Active  Participation Quality:  Appropriate, Attentive, Sharing and Supportive  Affect:  Flat  Cognitive:  Alert  Insight:  Limited  Engagement in Group:  Engaged  Modes of Intervention:  Clarification, Education and Support  Summary of Progress/Problems: Positive thinking and positive change were discussed.   Marcille Buffy 03/05/2021, 10:15 PM

## 2021-03-05 NOTE — Progress Notes (Signed)
[facility] MD Progress Note  03/05/2021 11:47 AM Scott Norton  MRN:  [id number]   Subjective: Scott Norton reports, "I'm a tired because I did not sleep good last night. They open my door every 15 minutes."  Reason for admission: Scott Norton is a 55 year old male with a history of cocaine abuse, sickle cell disease and chronic pain who presented to [facility], ED voluntarily reporting worsening depressive symptoms and suicidal ideation with thoughts of jumping off a bridge.    Objective: Patient is seen and evaluated, chart reviewed and case discussed with the treatment team.  Patient has difficulty managing his irritation over interactions with his probation officer because of the way she talks to him in a very degrading manner. Patient stated he has enough money for a hotel room when he leaves the hospital. He is aware he will be discharged tomorrow. He will call [employer] [facility] when he has money to pay what they ask for rent. Patient reports that he has felt better in the hospital and has been eating and sleeping okay. He complained that someone was walking up and down the hall last night and it kept waking him up.  He denies SI/HI, AVH, paranoia and delusions. He denies any psychotic symptoms. He has been visualized on the unit talking to all the male patients. He was seen hugging a male patient who was discharging good-bye. He apparently asked the unit secretary some personal questions today while she was sitting in the dayroom with the patients. He has poor boundaries. He is attending group therapy and stated he is working on some coping skills. He is taking his medication as prescribed and has no issues with them. He has required PRN Tylenol for back pain. He received Vistaril PRN x 2 in the past 24 hours and Trazodone x 1 for sleep. VS are stable, no new medications or labs today. Will continue to monitor for safety and encourage patient to find housing solution.   Principal Problem: Cocaine abuse with  cocaine-induced mood disorder (HCC) Diagnosis: Principal Problem:   Cocaine abuse with cocaine-induced mood disorder (HCC) Active Problems:   Sickle cell anemia (HCC)   Tobacco use   Homelessness   Cocaine abuse (HCC)   Depression  Total Time spent with patient: 15 minutes  Past Psychiatric History: See admission H&P  Past Medical History:  Past Medical History:  Diagnosis Date  . AKI (acute kidney injury) (HCC) 02/08/2021  . Anemia   . Depression   . Heart murmur   . Seasonal allergies    wool & grass  . Sickle cell anemia (HCC)     Past Surgical History:  Procedure Laterality Date  . NO PAST SURGERIES    . ROOT CANAL     Family History:  Family History  Problem Relation Age of Onset  . Alcohol abuse Mother   . Sickle cell trait Mother   . Hypertension Mother   . Alcohol abuse Father   . Sickle cell trait Father   . Early death Father        due to alcoholism  . Hypertension Father   . Hypertension Sister   . Hypertension Brother   . Hypertension Maternal Grandmother   . Hypertension Maternal Grandfather   . Hypertension Paternal Grandmother   . Hypertension Paternal Grandfather    Family Psychiatric  History: See admission H&P Social History:  Social History   Substance and Sexual Activity  Alcohol Use Yes   Comment: occasional but indicated etoh abuse on  health hx questionaire     Social History   Substance and Sexual Activity  Drug Use Yes  . Types: Marijuana, Cocaine    Social History   Socioeconomic History  . Marital status: Single    Spouse name: Not on file  . Number of children: Not on file  . Years of education: Not on file  . Highest education level: Not on file  Occupational History  . Not on file  Tobacco Use  . Smoking status: Current Every Day Smoker    Packs/day: 0.50    Types: Cigars  . Smokeless tobacco: Never Used  Vaping Use  . Vaping Use: Never used  Substance and Sexual Activity  . Alcohol use: Yes    Comment:  occasional but indicated etoh abuse on health hx questionaire  . Drug use: Yes    Types: Marijuana, Cocaine  . Sexual activity: Yes  Other Topics Concern  . Not on file  Social History Narrative   Lives with Friends in [place]. Working on housing for himself   On disability for his SS disease      No children, no stable relationship   Social Determinants of Corporate investment banker Strain: Not on file  Food Insecurity: Not on file  Transportation Needs: Not on file  Physical Activity: Not on file  Stress: Not on file  Social Connections: Not on file   Additional Social History:    History of alcohol / drug use?: Yes Negative Consequences of Use: Financial,Legal Name of Substance 1: cocaine 1 - Amount (size/oz): varies 1 - Frequency: varies Name of Substance 2: THC 2 - Amount (size/oz): varies 2 - Frequency: varies  Sleep: Fair  Appetite:  Good  Current Medications: Current Facility-Administered Medications  Medication Dose Route Frequency Provider Last Rate Last Admin  . acetaminophen (TYLENOL) tablet 650 mg  650 mg Oral Q6H PRN Ardis Hughs, NP   650 mg at 03/05/21 0749  . alum & mag hydroxide-simeth (MAALOX/MYLANTA) 200-200-20 MG/5ML suspension 30 mL  30 mL Oral Q4H PRN Ardis Hughs, NP      . escitalopram (LEXAPRO) tablet 5 mg  5 mg Oral Daily Claudie Revering, MD   5 mg at 03/05/21 0749  . hydrOXYzine (ATARAX/VISTARIL) tablet 25 mg  25 mg Oral TID PRN Ardis Hughs, NP   25 mg at 03/04/21 2134  . magnesium hydroxide (MILK OF MAGNESIA) suspension 30 mL  30 mL Oral Daily PRN Ardis Hughs, NP      . multivitamin (PROSIGHT) tablet 1 tablet  1 tablet Oral Daily Claudie Revering, MD   1 tablet at 03/05/21 0749  . nicotine polacrilex (NICORETTE) gum 2 mg  2 mg Oral PRN White, Patrice L, NP   2 mg at 03/03/21 2121  . traZODone (DESYREL) tablet 50 mg  50 mg Oral QHS PRN Ardis Hughs, NP   50 mg at 03/04/21 2134    Lab Results:  Results for orders  placed or performed during the hospital encounter of 03/01/21 (from the past 48 hour(s))  Comprehensive metabolic panel     Status: Abnormal   Collection Time: 03/03/21  6:28 PM  Result Value Ref Range   Sodium 137 135 - 145 mmol/L   Potassium 4.2 3.5 - 5.1 mmol/L   Chloride 105 98 - 111 mmol/L   CO2 27 22 - 32 mmol/L   Glucose, Bld 111 (H) 70 - 99 mg/dL    Comment: Glucose reference range applies  only to samples taken after fasting for at least 8 hours.   BUN 12 6 - 20 mg/dL   Creatinine, Ser [id number] 0.61 - 1.24 mg/dL   Calcium 9.2 8.9 - 84.110.3 mg/dL   Total Protein 7.1 6.5 - 8.1 g/dL   Albumin 4.0 3.5 - 5.0 g/dL   AST 45 (H) 15 - 41 U/L   ALT 27 0 - 44 U/L   Alkaline Phosphatase 200 (H) 38 - 126 U/L   Total Bilirubin 1.6 (H) 0.3 - 1.2 mg/dL   GFR, Estimated >32>60 >44>60 mL/min    Comment: (NOTE) Calculated using the CKD-EPI Creatinine Equation (2021)    Anion gap 5 5 - 15    Comment: Performed at [facility], [address] [place], [place] [id number]    Blood Alcohol level:  Lab Results  Component Value Date   ETH <10 02/27/2021   ETH <10 02/07/2021    Metabolic Disorder Labs: Lab Results  Component Value Date   HGBA1C <4.2 (L) 03/01/2021   MPG <74 03/01/2021   No results found for: PROLACTIN Lab Results  Component Value Date   CHOL 127 03/01/2021   TRIG 89 03/01/2021   HDL 43 03/01/2021   CHOLHDL 3.0 03/01/2021   VLDL 18 03/01/2021   LDLCALC 66 03/01/2021   LDLCALC 60 06/04/2012   Physical Findings: AIMS: Facial and Oral Movements Muscles of Facial Expression: None, normal Lips and Perioral Area: None, normal Jaw: None, normal Tongue: None, normal,Extremity Movements Upper (arms, wrists, hands, fingers): None, normal Lower (legs, knees, ankles, toes): None, normal, Trunk Movements Neck, shoulders, hips: None, normal, Overall Severity Severity of abnormal movements (highest score from questions above): None, normal Incapacitation due to  abnormal movements: None, normal Patient's awareness of abnormal movements (rate only patient's report): No Awareness, Dental Status Current problems with teeth and/or dentures?: No Does patient usually wear dentures?: No  CIWA:  CIWA-Ar Total: 0 COWS:     Musculoskeletal: Strength & Muscle Tone: within normal limits Gait & Station: normal Patient leans: N/A  Psychiatric Specialty Exam:  Presentation  General Appearance: Casual; Appropriate for Environment; Other (comment)  Eye Contact:Good  Speech:Clear and Coherent; Normal Rate  Speech Volume:Normal  Handedness:Right  Mood and Affect  Mood:Depressed  Affect:Congruent  Thought Process  Thought Processes:Coherent; Goal Directed  Descriptions of Associations:Intact  Orientation:Full (Time, Place and Person)  Thought Content:Logical; Perseveration; Rumination (Perseveration and rumination regarding negative interactions with probation officer)  History of Schizophrenia/Schizoaffective disorder:No  Duration of Psychotic Symptoms:No data recorded Hallucinations:No data recorded  Ideas of Reference:None  Suicidal Thoughts:No data recorded  Homicidal Thoughts:No data recorded  Sensorium  Memory:Immediate Good; Recent Good; Remote Good  Judgment:Fair  Insight:Fair  Executive Functions  Concentration:Good  Attention Span:Good  Recall:Good  Fund of Knowledge:Good  Language:Good  Psychomotor Activity  Psychomotor Activity:No data recorded  Assets  Assets:Communication Skills; Desire for Improvement; Leisure Time; Physical Health; Resilience  Sleep  Sleep:No data recorded  Physical Exam: Physical Exam Vitals and nursing note reviewed.  HENT:     Head: Normocephalic and atraumatic.  Pulmonary:     Effort: Pulmonary effort is normal.  Musculoskeletal:        General: Normal range of motion.     Cervical back: Normal range of motion.  Neurological:     General: No focal deficit present.      Mental Status: He is alert and oriented to person, place, and time.  Psychiatric:        Attention and Perception:  Attention normal. He does not perceive auditory or visual hallucinations.        Mood and Affect: Mood normal.        Speech: Speech normal.        Behavior: Behavior normal. Behavior is cooperative.        Thought Content: Thought content is not paranoid or delusional. Thought content does not include homicidal or suicidal ideation. Thought content does not include homicidal or suicidal plan.        Cognition and Memory: Cognition normal.    Review of Systems  Constitutional: Negative.   HENT: Negative.   Eyes: Negative.   Respiratory: Negative.   Genitourinary: Negative.   Skin: Negative.   Neurological: Negative.    Blood pressure 132/73, pulse 62, temperature 98 F (36.7 C), temperature source Oral, resp. rate 16, height 6\' 3"  (1.905 m), weight 70.8 kg, SpO2 96 %. Body mass index is 19.5 kg/m.  Treatment Plan Summary: Daily contact with patient to assess and evaluate symptoms and progress in treatment and Medication management.   Continue inpatient hospitalization. Will continue today 03/05/2021 plan as below except where it is noted.  Encourage participation in group therapy and therapeutic milieu  Continue every 15-minute observation status  Depression/anxiety -ContinueLexapro 5 mg daily (initiated on 03/02/2021) for mood and anxiety symptoms -Continue hydroxyzine 25 mg 3 times daily PRN anxiety  Insomnia -Continue trazodone 50 mg at bedtime PRN  Substance use disorder -Would benefit from participation in substance abuse treatment program after discharge if he is willing to attend.  EKG performed 03/01/2021 reviewed and revealed sinus bradycardia; moderate voltage criteria for LVH but may be normal variant; septal infarct, age undetermined.  Ventricular rate of 51 and QT/QTc of 462/425.  Disposition planning in progress  05/01/2021,  NP 03/05/2021, 11:47 AM

## 2021-03-05 NOTE — Plan of Care (Signed)
  Problem: Coping: Goal: Coping ability will improve Outcome: Progressing Goal: Will verbalize feelings Outcome: Progressing   Problem: Health Behavior/Discharge Planning: Goal: Ability to make decisions will improve Outcome: Progressing   

## 2021-03-05 NOTE — Progress Notes (Signed)
Progress note    03/05/21 0750  Psych Admission Type (Psych Patients Only)  Admission Status Voluntary  Psychosocial Assessment  Patient Complaints Anxiety;Depression  Eye Contact Fair  Facial Expression Anxious;Sullen;Sad;Worried  Affect Anxious;Depressed;Sad;Sullen  Furniture conservator/restorer  Appearance/Hygiene Unremarkable  Behavior Characteristics Cooperative;Appropriate to situation;Anxious  Mood Depressed;Anxious;Sad;Sullen;Pleasant  Thought Process  Coherency WDL  Content Blaming others  Delusions WDL  Perception WDL  Hallucination None reported or observed  Judgment WDL  Confusion None  Danger to Self  Current suicidal ideation? Denies  Self-Injurious Behavior No self-injurious ideation or behavior indicators observed or expressed   Danger to Others  Danger to Others None reported or observed

## 2021-03-06 MED ORDER — PROSIGHT PO TABS: 1.0000 | ORAL_TABLET | Freq: Every day | ORAL | 0 refills | Status: DC

## 2021-03-06 MED ORDER — TRAZODONE HCL 50 MG PO TABS: 50.0000 mg | ORAL_TABLET | Freq: Every evening | ORAL | 0 refills | Status: DC | PRN

## 2021-03-06 MED ORDER — IBUPROFEN 800 MG PO TABS: 800.0000 mg | ORAL_TABLET | Freq: Three times a day (TID) | ORAL | 0 refills | Status: DC | PRN

## 2021-03-06 MED ORDER — ESCITALOPRAM OXALATE 5 MG PO TABS: 5.0000 mg | ORAL_TABLET | Freq: Every day | ORAL | 0 refills | Status: DC

## 2021-03-06 NOTE — Progress Notes (Signed)
   03/06/21 0500  Psych Admission Type (Psych Patients Only)  Admission Status Voluntary  Psychosocial Assessment  Patient Complaints None  Eye Contact Fair  Facial Expression Anxious;Sullen;Sad;Worried  Affect Anxious;Depressed;Sad;Sullen  Furniture conservator/restorer  Appearance/Hygiene Unremarkable  Thought Process  Coherency WDL  Content Blaming others  Delusions WDL  Perception WDL  Hallucination None reported or observed  Judgment WDL  Confusion None  Danger to Self  Current suicidal ideation? Denies  Self-Injurious Behavior No self-injurious ideation or behavior indicators observed or expressed   Danger to Others  Danger to Others None reported or observed   D: Patient is alert and oriented x 4. Patient denies SI/HI/ AVH and pain. Disposition is Calm and cooperative with appropriate affect. Verbally contracts for safety to this Clinical research associate.   A:  Pt was given scheduled medications. Pt was encourage to attend groups. Q 15 minute checks were done for safety. Pt was visualized interacting appropriately with others in the milieu. Pt Pt was offered support and encouragement by this Clinical research associate. Pt is goal oriented and stated goal was reached for the day. Pt attended groups and interacts well with peers and staff. Pt is taking medication. Pt complied with scheduled medications. No signs of distress nor concerns reported by patient at present.Pt remains receptive to treatment and safety maintained on unit.    R: Will continue to monitor and assess. Safety maintained during this shift.

## 2021-03-06 NOTE — Discharge Summary (Signed)
Physician Discharge Summary Note  Patient:  Scott Norton is an 55 y.o., male MRN:  [id number] DOB:  07/25/1966 Patient phone:  [phone] (home)  Patient address:   [address] [place] [address],  Total Time spent with patient: 30 minutes  Date of Admission:  03/01/2021 Date of Discharge: 03/05/2021  Reason for Admission:  (From MD's admission note): Scott Norton is a 55 year old male who presented to [facility] with depressed mood in the face of pain and dealing with his sickle cell disease. He has no previous psychiatric history. He stated he is on probation for the next 3 years for selling drugs to an undercover cop. He has a Civil Service fast streamer and the thought of calling her when he is discharged is making him depressed. He is calm, cooperative, pleasant on approach. He has been incarcerated in the past and stated he feels this parole officer is trying to find some reason to put him in jail again. Patient is homeless and has been living in week to week motels. He had to leave his motel on Monday and has not been able to find another one or an apartment. He stated he told his probation officer he had to leave his motel and she told him he needs an address by 7 PM that night. He decided to go to the hospital instead. He is a registered sex offender which adds to his difficulty finding a place to live. He receives SSI for his sickle cell. He sees [employer] at the sickle cel clinic. He was hospitalized 02/08/2021 for his sickle cell when his Hgb dropped to 6.1, currently his H/H  8.7/26.7. He is asymptomatic. His UDS is positive for cocaine, BAL negative. He denies daily drug use, he stated "if I have a dirty drug test my PO will send me to jail." He denies HI/AVH, paranoia and delusions. He stated he would feel suicidal if he was told he had to be discharged today. He stated he feels safe in the hospital. His main issue at this time is housing and social stressors. He is mildly depressed. We discussed  starting an antidepressant. He is open to this. Will start low dose Lexapro. Will continue to monitor for safety. Encouragement and support provided.   Evaluation on the unit: Patient was seen and evaluated. Patient denies SI/HI/AVH, paranoia and delusions. Patient is taking his medications and has no issues with them. He is calm and cooperative. He has been attending group therapy and interacting appropriately with staff and peers. He is eating and sleeping well. He is homeless and will go to the [facility] when discharged for assistance with housing/shelter needs. He has a history of sickle cell disease and does receive SSI. He has an appointment with [facility] on 5/19 at 1:00 PM for therapy and medication management. He has been instructed to call to keep the appointment. He is on parole and will call his parole officer to let her know he is being discharged. His VS are stable, he is afebrile. Patient is stable for discharge today.   Principal Problem: Cocaine abuse with cocaine-induced mood disorder [facility]) Discharge Diagnoses: Principal Problem:   Cocaine abuse with cocaine-induced mood disorder (HCC) Active Problems:   Sickle cell anemia (HCC)   Tobacco use   Homelessness   Cocaine abuse (HCC)   Depression  Past Psychiatric History: See H&P  Past Medical History:  Past Medical History:  Diagnosis Date  . AKI (acute kidney injury) (HCC) 02/08/2021  . Anemia   .  Depression   . Heart murmur   . Seasonal allergies    wool & grass  . Sickle cell anemia (HCC)     Past Surgical History:  Procedure Laterality Date  . NO PAST SURGERIES    . ROOT CANAL     Family History:  Family History  Problem Relation Age of Onset  . Alcohol abuse Mother   . Sickle cell trait Mother   . Hypertension Mother   . Alcohol abuse Father   . Sickle cell trait Father   . Early death Father        due to alcoholism  . Hypertension Father   . Hypertension Sister   . Hypertension Brother   .  Hypertension Maternal Grandmother   . Hypertension Maternal Grandfather   . Hypertension Paternal Grandmother   . Hypertension Paternal Grandfather    Family Psychiatric  History: See H&P Social History:  Social History   Substance and Sexual Activity  Alcohol Use Yes   Comment: occasional but indicated etoh abuse on health hx questionaire     Social History   Substance and Sexual Activity  Drug Use Yes  . Types: Marijuana, Cocaine    Social History   Socioeconomic History  . Marital status: Single    Spouse name: Not on file  . Number of children: Not on file  . Years of education: Not on file  . Highest education level: Not on file  Occupational History  . Not on file  Tobacco Use  . Smoking status: Current Every Day Smoker    Packs/day: 0.50    Types: Cigars  . Smokeless tobacco: Never Used  Vaping Use  . Vaping Use: Never used  Substance and Sexual Activity  . Alcohol use: Yes    Comment: occasional but indicated etoh abuse on health hx questionaire  . Drug use: Yes    Types: Marijuana, Cocaine  . Sexual activity: Yes  Other Topics Concern  . Not on file  Social History Narrative   Lives with Friends in [place]. Working on housing for himself   On disability for his SS disease      No children, no stable relationship   Social Determinants of Corporate investment banker Strain: Not on file  Food Insecurity: Not on file  Transportation Needs: Not on file  Physical Activity: Not on file  Stress: Not on file  Social Connections: Not on file    Hospital Course:  After the above admission evaluation, [name]'s  presenting symptoms were noted. He was recommended for mood stabilization treatments. The medication regimen targeting those presenting symptoms were discussed with him & initiated with his consent. He was started on Lexapro for depression. He received Trazodone PRN at bedtime for sleep. His UDS on arrival to the ED was positive for cocaine. His BAL was  negative. He was however medicated, stabilized & discharged on the medications as listed on his discharge medication list below. Besides the mood stabilization treatments, Javoris was also enrolled & participated in the group counseling sessions being offered & held on this unit. He learned coping skills. He presented no other significant pre-existing medical issues that required treatment. He tolerated his treatment regimen without any adverse effects or reactions reported.   During the course of his hospitalization, the 15-minute checks were adequate to ensure patient's safety. Phat did not display any dangerous, violent or suicidal behavior on the unit.  He interacted with patients & staff appropriately, participated appropriately in the group sessions/therapies.  His medications were addressed & adjusted to meet his needs. He was recommended for outpatient follow-up care & medication management upon discharge to assure continuity of care & mood stability.  At the time of discharge patient is not reporting any acute suicidal/homicidal ideations. He feels more confident about his self-care & in managing his mental health. He currently denies any new issues or concerns. Education and supportive counseling provided throughout his hospital stay & upon discharge.   Today upon his discharge evaluation with the attending psychiatrist, Caryn BeeKevin shares he is doing well. He denies any other specific concerns. He is sleeping well. His appetite is good. He denies other physical complaints. He denies AH/VH, delusional thoughts or paranoia. He does not appear to be responding to any internal stimuli. He feels that his medications have been helpful & is in agreement to continue his current treatment regimen as recommended. He was able to engage in safety planning including [facility] or contact emergency services if he feels unable to maintain his own safety or the safety of others. Pt had no further questions,  comments, or concerns. [facility] in no apparent distress. Transportation per [facility] to the [facility].   Physical Findings: AIMS: Facial and Oral Movements Muscles of Facial Expression: None, normal Lips and Perioral Area: None, normal Jaw: None, normal Tongue: None, normal,Extremity Movements Upper (arms, wrists, hands, fingers): None, normal Lower (legs, knees, ankles, toes): None, normal, Trunk Movements Neck, shoulders, hips: None, normal, Overall Severity Severity of abnormal movements (highest score from questions above): None, normal Incapacitation due to abnormal movements: None, normal Patient's awareness of abnormal movements (rate only patient's report): No Awareness, Dental Status Current problems with teeth and/or dentures?: No Does patient usually wear dentures?: No  CIWA:  CIWA-Ar Total: 0 COWS:     Musculoskeletal: Strength & Muscle Tone: within normal limits Gait & Station: normal Patient leans: N/A  Psychiatric Specialty Exam:  Presentation  General Appearance: Appropriate for Environment; Casual  Eye Contact:Good  Speech:Clear and Coherent; Normal Rate  Speech Volume:Normal  Handedness:Right  Mood and Affect  Mood:Euthymic  Affect:Congruent  Thought Process  Thought Processes:Coherent; Goal Directed  Descriptions of Associations:Intact  Orientation:Full (Time, Place and Person)  Thought Content:Logical  History of Schizophrenia/Schizoaffective disorder:No  Duration of Psychotic Symptoms:No data recorded Hallucinations:Hallucinations: None  Ideas of Reference:None  Suicidal Thoughts:Suicidal Thoughts: No  Homicidal Thoughts:Homicidal Thoughts: No  Sensorium  Memory:Immediate Fair; Recent Fair; Remote Fair  Judgment:Good  Insight:Good  Executive Functions  Concentration:Good  Attention Span:Good  Recall:Good  Fund of Knowledge:Good  Language:Good  Psychomotor Activity  Psychomotor  Activity:Psychomotor Activity: Normal  Assets  Assets:Communication Skills; Desire for Improvement; Financial Resources/Insurance; Resilience; Social Support  Sleep  Sleep:Sleep: Good Number of Hours of Sleep: 6.25  Physical Exam: Physical Exam Vitals and nursing note reviewed.  Constitutional:      Appearance: Normal appearance.  HENT:     Head: Normocephalic.  Pulmonary:     Effort: Pulmonary effort is normal.  Musculoskeletal:        General: Normal range of motion.     Cervical back: Normal range of motion.  Neurological:     Mental Status: He is alert and oriented to person, place, and time.  Psychiatric:        Attention and Perception: Attention normal. He does not perceive auditory or visual hallucinations.        Mood and Affect: Mood normal.        Speech:  Speech normal.        Behavior: Behavior normal. Behavior is cooperative.        Thought Content: Thought content normal. Thought content is not paranoid or delusional. Thought content does not include homicidal or suicidal ideation. Thought content does not include homicidal or suicidal plan.        Cognition and Memory: Cognition normal.    Review of Systems  Constitutional: Negative.  Negative for fever.  HENT: Negative.  Negative for congestion and sore throat.   Respiratory: Negative for cough, shortness of breath and wheezing.   Cardiovascular: Negative.  Negative for chest pain.  Gastrointestinal: Negative.   Genitourinary: Negative.   Musculoskeletal: Negative.   Neurological: Negative.    Blood pressure 139/75, pulse 61, temperature 97.7 F (36.5 C), temperature source Oral, resp. rate 16, height  (1.905 m), weight 70.8 kg, SpO2 97 %. Body mass index is 19.5 kg/m.   Have you used any form of tobacco in the last 30 days? (Cigarettes, Smokeless Tobacco, Cigars, and/or Pipes): Yes  Has this patient used any form of tobacco in the last 30 days? (Cigarettes, Smokeless Tobacco, Cigars, and/or Pipes)  Yes, N/A  Blood Alcohol level:  Lab Results  Component Value Date   ETH <10 02/27/2021   ETH <10 02/07/2021    Metabolic Disorder Labs:  Lab Results  Component Value Date   HGBA1C <4.2 (L) 03/01/2021   MPG <74 03/01/2021   No results found for: PROLACTIN Lab Results  Component Value Date   CHOL 127 03/01/2021   TRIG 89 03/01/2021   HDL 43 03/01/2021   CHOLHDL 3.0 03/01/2021   VLDL 18 03/01/2021   LDLCALC 66 03/01/2021   LDLCALC 60 06/04/2012    See Psychiatric Specialty Exam and Suicide Risk Assessment completed by Attending Physician prior to discharge.  Discharge destination:  Home  Is patient on multiple antipsychotic therapies at discharge:  No   Has Patient had three or more failed trials of antipsychotic monotherapy by history:  No  Recommended Plan for Multiple Antipsychotic Therapies: NA  Discharge Instructions    Diet - low sodium heart healthy   Complete by: As directed    Increase activity slowly   Complete by: As directed      Allergies as of 03/06/2021      Reactions   Aspirin Other (See Comments)   Increased heart rate   Tape Other (See Comments)   rash   Tramadol Hives, Itching      Medication List    STOP taking these medications   oxyCODONE 15 MG immediate release tablet Commonly known as: ROXICODONE   oxyCODONE-acetaminophen 10-325 MG tablet Commonly known as: Percocet     TAKE these medications     Indication  escitalopram 5 MG tablet Commonly known as: LEXAPRO Take 1 tablet (5 mg total) by mouth daily. Start taking on: Mar 07, 2021  Indication: Major Depressive Disorder   ibuprofen 800 MG tablet Commonly known as: ADVIL Take 1 tablet (800 mg total) by mouth every 8 (eight) hours as needed. What changed:   how much to take  when to take this  reasons to take this  Indication: Pain   multivitamin Tabs tablet Take 1 tablet by mouth daily. Start taking on: Mar 07, 2021  Indication: supplement   traZODone 50 MG  tablet Commonly known as: DESYREL Take 1 tablet (50 mg total) by mouth at bedtime as needed for sleep.  Indication: Trouble Sleeping  Follow-up Information    [facility] Follow up on 03/09/2021.   Why: You have a hospital follow up appointment on 03/09/21 at 1:00 pm for therapy and medication management services.  This appointment will be held in person.  * YOU MUST CALL TO CONFIRM TO KEEP THIS APPT. Contact information: [address] [place] [address] [phone]               Follow-up recommendations:  Activity:  as tolerated Diet:  Heart healthy  Comments: Prescriptions given at discharge.  Patient agreeable to plan.  Patient was given the opportunity to ask questions.  He appears to feel comfortable with discharge denies any current suicidal or homicidal thoughts.   Patient is instructed prior to discharge to: Take all medications as prescribed by his mental healthcare provider. Report any adverse effects and or reactions from the medicines to his outpatient provider promptly. Patient has been instructed & cautioned: To not engage in alcohol and or illegal drug use while on prescription medicines. In the event of worsening symptoms, patient is instructed to call the crisis hotline, 911 and or go to the nearest ED for appropriate evaluation and treatment of symptoms. To follow-up with his primary care provider for your other medical issues, concerns and or health care needs.   Signed: Laveda Abbe, NP 03/06/2021, 12:31 PM

## 2021-03-06 NOTE — Tx Team (Signed)
Interdisciplinary Treatment and Diagnostic Plan Update  03/06/2021 Time of Session: 9:40am Scott Norton MRN: [id number]  Principal Diagnosis: Cocaine abuse with cocaine-induced mood disorder ([place])  Secondary Diagnoses: Principal Problem:   Cocaine abuse with cocaine-induced mood disorder ([place]) Active Problems:   Sickle cell anemia (HCC)   Tobacco use   Homelessness   Cocaine abuse ([place])   Depression   Current Medications:  Current Facility-Administered Medications  Medication Dose Route Frequency Provider Last Rate Last Admin  . acetaminophen (TYLENOL) tablet 650 mg  650 mg Oral Q6H PRN Revonda Humphrey, NP   650 mg at 03/06/21 0744  . alum & mag hydroxide-simeth (MAALOX/MYLANTA) 200-200-20 MG/5ML suspension 30 mL  30 mL Oral Q4H PRN Revonda Humphrey, NP      . escitalopram (LEXAPRO) tablet 5 mg  5 mg Oral Daily Arthor Captain, MD   5 mg at 03/06/21 0744  . hydrOXYzine (ATARAX/VISTARIL) tablet 25 mg  25 mg Oral TID PRN Revonda Humphrey, NP   25 mg at 03/04/21 2134  . magnesium hydroxide (MILK OF MAGNESIA) suspension 30 mL  30 mL Oral Daily PRN Revonda Humphrey, NP      . multivitamin (PROSIGHT) tablet 1 tablet  1 tablet Oral Daily Arthor Captain, MD   1 tablet at 03/06/21 0743  . nicotine polacrilex (NICORETTE) gum 2 mg  2 mg Oral PRN White, Patrice L, NP   2 mg at 03/03/21 2121  . traZODone (DESYREL) tablet 50 mg  50 mg Oral QHS PRN Revonda Humphrey, NP   50 mg at 03/05/21 2105   PTA Medications: Medications Prior to Admission  Medication Sig Dispense Refill Last Dose  . ibuprofen (ADVIL) 800 MG tablet TAKE 1 TABLET BY MOUTH THREE TIMES DAILY (Patient taking differently: Take 800 mg by mouth 3 (three) times daily.) 30 tablet 0   . oxyCODONE (ROXICODONE) 15 MG immediate release tablet Take 15 mg by mouth every 4 (four) hours as needed for pain.     . [EXPIRED] oxyCODONE-acetaminophen (PERCOCET) 10-325 MG tablet Take 1 tablet by mouth every 4 (four) hours as needed for up  to 15 days for pain. (Patient not taking: No sig reported) 60 tablet 0     Patient Stressors: Financial difficulties Health problems Loss of no place to currently live  Patient Strengths: Ability for insight Average or above average intelligence Capable of independent living Communication skills  Treatment Modalities: Medication Management, Group therapy, Case management,  1 to 1 session with clinician, Psychoeducation, Recreational therapy.   Physician Treatment Plan for Primary Diagnosis: Cocaine abuse with cocaine-induced mood disorder ([place]) Long Term Goal(s): Improvement in symptoms so as ready for discharge Improvement in symptoms so as ready for discharge   Short Term Goals: Ability to identify changes in lifestyle to reduce recurrence of condition will improve Ability to disclose and discuss suicidal ideas Ability to identify and develop effective coping behaviors will improve Ability to identify triggers associated with substance abuse/mental health issues will improve Ability to identify changes in lifestyle to reduce recurrence of condition will improve Ability to disclose and discuss suicidal ideas Ability to identify and develop effective coping behaviors will improve Compliance with prescribed medications will improve Ability to identify triggers associated with substance abuse/mental health issues will improve  Medication Management: Evaluate patient's response, side effects, and tolerance of medication regimen.  Therapeutic Interventions: 1 to 1 sessions, Unit Group sessions and Medication administration.  Evaluation of Outcomes: Adequate for Discharge  Physician Treatment Plan for  Secondary Diagnosis: Principal Problem:   Cocaine abuse with cocaine-induced mood disorder (HCC) Active Problems:   Sickle cell anemia (HCC)   Tobacco use   Homelessness   Cocaine abuse ([place])   Depression  Long Term Goal(s): Improvement in symptoms so as ready for  discharge Improvement in symptoms so as ready for discharge   Short Term Goals: Ability to identify changes in lifestyle to reduce recurrence of condition will improve Ability to disclose and discuss suicidal ideas Ability to identify and develop effective coping behaviors will improve Ability to identify triggers associated with substance abuse/mental health issues will improve Ability to identify changes in lifestyle to reduce recurrence of condition will improve Ability to disclose and discuss suicidal ideas Ability to identify and develop effective coping behaviors will improve Compliance with prescribed medications will improve Ability to identify triggers associated with substance abuse/mental health issues will improve     Medication Management: Evaluate patient's response, side effects, and tolerance of medication regimen.  Therapeutic Interventions: 1 to 1 sessions, Unit Group sessions and Medication administration.  Evaluation of Outcomes: Not Met   RN Treatment Plan for Primary Diagnosis: Cocaine abuse with cocaine-induced mood disorder ([place]) Long Term Goal(s): Knowledge of disease and therapeutic regimen to maintain health will improve  Short Term Goals: Ability to remain free from injury will improve, Ability to verbalize frustration and anger appropriately will improve, Ability to demonstrate self-control, Ability to identify and develop effective coping behaviors will improve and Compliance with prescribed medications will improve  Medication Management: RN will administer medications as ordered by provider, will assess and evaluate patient's response and provide education to patient for prescribed medication. RN will report any adverse and/or side effects to prescribing provider.  Therapeutic Interventions: 1 on 1 counseling sessions, Psychoeducation, Medication administration, Evaluate responses to treatment, Monitor vital signs and CBGs as ordered, Perform/monitor CIWA,  COWS, AIMS and Fall Risk screenings as ordered, Perform wound care treatments as ordered.  Evaluation of Outcomes: Adequate for Discharge   LCSW Treatment Plan for Primary Diagnosis: Cocaine abuse with cocaine-induced mood disorder ([place]) Long Term Goal(s): Safe transition to appropriate next level of care at discharge, Engage patient in therapeutic group addressing interpersonal concerns.  Short Term Goals: Engage patient in aftercare planning with referrals and resources, Increase social support, Increase ability to appropriately verbalize feelings, Identify triggers associated with mental health/substance abuse issues and Increase skills for wellness and recovery  Therapeutic Interventions: Assess for all discharge needs, 1 to 1 time with Social worker, Explore available resources and support systems, Assess for adequacy in community support network, Educate family and significant other(s) on suicide prevention, Complete Psychosocial Assessment, Interpersonal group therapy.  Evaluation of Outcomes: Adequate for Discharge   Progress in Treatment: Attending groups: Yes. Participating in groups: Yes. Taking medication as prescribed: Yes. Toleration medication: Yes. Family/Significant other contact made: No, will contact:  attempted to contact Probation Officer Patient understands diagnosis: Yes. Discussing patient identified problems/goals with staff: Yes. Medical problems stabilized or resolved: Yes. Denies suicidal/homicidal ideation: Yes. Issues/concerns per patient self-inventory: No.  New problem(s) identified: No, Describe:  none  New Short Term/Long Term Goal(s): detox, medication management for mood stabilization; elimination of SI thoughts; development of comprehensive mental wellness/sobriety plan   Patient Goals:  Did not attend  Discharge Plan or Barriers: Patient is currently homeless. Pt is to discharge to shelter and is to follow up with [facility] for outpatient psychiatry.     Reason for Continuation of Hospitalization: Medication stabilization  Estimated Length of Stay:  3-5 days  Attendees: Patient:  03/01/2021   Physician:  03/01/2021   Nursing:  03/01/2021   RN Care Manager: 03/01/2021   Social Worker: Darletta Moll, LCSW 03/01/2021   Recreational Therapist:  03/01/2021   Other:  03/01/2021   Other:  03/01/2021   Other: 03/01/2021       Scribe for Treatment Team: Vassie Moselle, LCSW 03/06/2021 10:29 AM

## 2021-03-06 NOTE — Progress Notes (Signed)
  [facility] Adult Case Management Discharge Plan :  Will you be returning to the same living situation after discharge:  No. IRC At discharge, do you have transportation home?: Yes,  Safe Transport  Do you have the ability to pay for your medications: Yes,  Medicare   Release of information consent forms completed and in the chart;  Patient's signature needed at discharge.  Patient to Follow up at:  Follow-up Information    [facility], [facility] Follow up on 03/09/2021.   Why: You have a hospital follow up appointment on 03/09/21 at 1:00 pm for therapy and medication management services.  This appointment will be held in person.  * YOU MUST CALL TO CONFIRM TO KEEP THIS APPT. Contact information: [address] [place] [address] [phone]               Next level of care provider has access to [facility]:no  Safety Planning and Suicide Prevention discussed: Yes,  with patient   Have you used any form of tobacco in the last 30 days? (Cigarettes, Smokeless Tobacco, Cigars, and/or Pipes): Yes  Has patient been referred to the Quitline?: Patient refused referral  Patient has been referred for addiction treatment: Yes  Aram Beecham, LCSWA 03/06/2021, 9:44 AM

## 2021-03-06 NOTE — Progress Notes (Addendum)
Recreation Therapy Notes  Date: 5.16.22 Time: 0930 Location: [address] Dayroom  Group Topic: Stress Management   Goal Area(s) Addresses:  Patient will actively participate in stress management techniques presented during session.  Patient will successfully identify benefit of practicing stress management post d/c.   Intervention: Guided exercise with ambient sound and script  Activity :Guided Imagery  LRT read a script that dealt with patients visualizing their peaceful place.  This is a place where can have peace, relax and escape from the things they are dealing for a few moments.  Education:  Stress Management, Discharge Planning.   Education Outcome: Acknowledges education  Clinical Observations/Feedback: Patient did not attend group session.     Caroll Rancher, LRT/CTRS         Caroll Rancher A 03/06/2021 10:46 AM

## 2021-03-06 NOTE — Progress Notes (Signed)
Discharge Note:  Patient denies SI/HI AVH at this time. Discharge instructions, AVS, prescriptions and transition record gone over with patient. Patient agrees to comply with medication management, follow-up visit, and outpatient therapy. Patient belongings returned to patient. Patient questions and concerns addressed and answered.  Patient ambulatory off unit.  Patient discharged to home with friend.   

## 2021-03-06 NOTE — Care Management Important Message (Signed)
Medicare IM given to Angel Webster, LCSW to give to the patient. 

## 2021-03-06 NOTE — [facility] Suicide Risk Assessment (Signed)
[facility] Discharge Suicide Risk Assessment   Principal Problem: Cocaine abuse with cocaine-induced mood disorder (HCC) Discharge Diagnoses: Principal Problem:   Cocaine abuse with cocaine-induced mood disorder (HCC) Active Problems:   Sickle cell anemia (HCC)   Tobacco use   Homelessness   Cocaine abuse (HCC)   Depression   Total Time spent with patient: 15 minutes  Musculoskeletal: Strength & Muscle Tone: within normal limits Gait & Station: normal Patient leans: N/A  Psychiatric Specialty Exam  Presentation  General Appearance: Appropriate for Environment; Casual  Eye Contact:Good  Speech:Clear and Coherent; Normal Rate  Speech Volume:Normal  Handedness:Right   Mood and Affect  Mood:Euthymic  Duration of Depression Symptoms: Greater than two weeks  Affect:Congruent   Thought Process  Thought Processes:Coherent; Goal Directed; Linear  Descriptions of Associations:Intact  Orientation:Full (Time, Place and Person)  Thought Content:Logical  History of Schizophrenia/Schizoaffective disorder:No  Duration of Psychotic Symptoms:No data recorded Hallucinations:Hallucinations: None  Ideas of Reference:None  Suicidal Thoughts:Suicidal Thoughts: No  Homicidal Thoughts:Homicidal Thoughts: No   Sensorium  Memory:Immediate Good; Remote Good; Recent Good  Judgment:Good  Insight:Good   Executive Functions  Concentration:Good  Attention Span:Good  Recall:Good  Fund of Knowledge:Good  Language:Good   Psychomotor Activity  Psychomotor Activity:Psychomotor Activity: Normal   Assets  Assets:Communication Skills; Desire for Improvement; Physical Health; Resilience; Social Support   Sleep  Sleep:Sleep: Good Number of Hours of Sleep: 6.25   Physical Exam: Physical Exam Vitals and nursing note reviewed.  HENT:     Head: Normocephalic and atraumatic.  Pulmonary:     Effort: Pulmonary effort is normal.  Neurological:     General: No focal  deficit present.     Mental Status: He is alert and oriented to person, place, and time.    ROS Blood pressure 139/75, pulse 61, temperature 97.7 F (36.5 C), temperature source Oral, resp. rate 16, height 6\' 3"  (1.905 m), weight 70.8 kg, SpO2 97 %. Body mass index is 19.5 kg/m.  Mental Status Per Nursing Assessment::   On Admission:  Suicidal ideation indicated by patient,Self-harm thoughts  Demographic Factors:  Male and Low socioeconomic status  Loss Factors: Legal issues and Financial problems/change in socioeconomic status  Historical Factors: NA  Risk Reduction Factors:   Positive coping skills or problem solving skills  Continued Clinical Symptoms:  Depression - improved Alcohol/Substance Abuse/Dependencies Chronic Pain  Cognitive Features That Contribute To Risk:  None    Suicide Risk:  Minimal: No identifiable suicidal ideation.  Patients presenting with no risk factors but with morbid ruminations; may be classified as minimal risk based on the severity of the depressive symptoms   Follow-up Information    [facility], [facility] Follow up on 03/09/2021.   Why: You have a hospital follow up appointment on 03/09/21 at 1:00 pm for therapy and medication management services.  This appointment will be held in person.  * YOU MUST CALL TO CONFIRM TO KEEP THIS APPT. Contact information: [address] [place] [phone]               Plan Of Care/Follow-up recommendations:  Activity:  As tolerated.  Other:   -Take medication as prescribed.   -Do not drink alcohol.  Do not use marijuana or other drugs.  -Attend residential substance abuse treatment program.   -Keep outpatient mental health follow-up appointments including appointments with therapist and psychiatrist.   -See your primary care provider regarding treatment of any medical conditions.   [phone], MD 03/06/2021, 10:29  AM

## 2021-03-08 ENCOUNTER — Emergency Department (HOSPITAL_COMMUNITY)
Admission: EM | Admit: 2021-03-08 | Discharge: 2021-03-09 | Disposition: A | Discharge status: Home / Self Care | Payer: Medicare Other | Attending: Emergency Medicine | Admitting: Emergency Medicine

## 2021-03-08 ENCOUNTER — Other Ambulatory Visit: Payer: Self-pay

## 2021-03-08 DIAGNOSIS — Z862 Personal history of diseases of the blood and blood-forming organs and certain disorders involving the immune mechanism: Secondary | ICD-10-CM | POA: Insufficient documentation

## 2021-03-08 DIAGNOSIS — M549 Dorsalgia, unspecified: Secondary | ICD-10-CM | POA: Diagnosis not present

## 2021-03-08 DIAGNOSIS — M25512 Pain in left shoulder: Secondary | ICD-10-CM | POA: Insufficient documentation

## 2021-03-08 DIAGNOSIS — M25511 Pain in right shoulder: Secondary | ICD-10-CM | POA: Insufficient documentation

## 2021-03-08 DIAGNOSIS — F17290 Nicotine dependence, other tobacco product, uncomplicated: Secondary | ICD-10-CM | POA: Diagnosis not present

## 2021-03-08 DIAGNOSIS — M545 Low back pain, unspecified: Secondary | ICD-10-CM

## 2021-03-08 MED ORDER — OXYCODONE-ACETAMINOPHEN 5-325 MG PO TABS
1.0000 | ORAL_TABLET | Freq: Once | ORAL | Status: AC
  Administered 2021-03-09: 1 via ORAL
  Filled 2021-03-08: qty 1

## 2021-03-08 NOTE — ED Provider Notes (Signed)
MSE was initiated and I personally evaluated the patient and placed orders (if any) at  11:10 PM on Mar 08, 2021.  H/O sickle cell, arthritis Pain in back and shoulders No SOB, cough, chest pain No vomiting  Today's Vitals   03/08/21 2308  BP: 118/67  Pulse: 76  Resp: 16  Temp: 98 F (36.7 C)  TempSrc: Oral  SpO2: 93%   There is no height or weight on file to calculate BMI.  Appears in NAD VSS  O2 sat 91% - he reports h/o low oxygen level usually  The patient appears stable so that the remainder of the MSE may be completed by another provider.   Elpidio Anis, PA-C 03/08/21 2312    Bethann Berkshire, MD 03/10/21 1550

## 2021-03-08 NOTE — ED Triage Notes (Signed)
Pt c/o back pain, states he has arthritis in his back.

## 2021-03-09 ENCOUNTER — Other Ambulatory Visit: Payer: Self-pay | Admitting: Nurse Practitioner

## 2021-03-09 DIAGNOSIS — M549 Dorsalgia, unspecified: Secondary | ICD-10-CM | POA: Diagnosis not present

## 2021-03-09 LAB — CBC WITH DIFFERENTIAL/PLATELET
Abs Immature Granulocytes: 0.02 10*3/uL (ref 0.00–0.07)
Basophils Absolute: 0 10*3/uL (ref 0.0–0.1)
Basophils Relative: 1 %
Eosinophils Absolute: 0.1 10*3/uL (ref 0.0–0.5)
Eosinophils Relative: 2 %
HCT: 22.2 % — ABNORMAL LOW (ref 39.0–52.0)
Hemoglobin: 7.5 g/dL — ABNORMAL LOW (ref 13.0–17.0)
Immature Granulocytes: 0 %
Lymphocytes Relative: 41 %
Lymphs Abs: 3 10*3/uL (ref 0.7–4.0)
MCH: 31.3 pg (ref 26.0–34.0)
MCHC: 33.8 g/dL (ref 30.0–36.0)
MCV: 92.5 fL (ref 80.0–100.0)
Monocytes Absolute: 1.2 10*3/uL — ABNORMAL HIGH (ref 0.1–1.0)
Monocytes Relative: 17 %
Neutro Abs: 2.8 10*3/uL (ref 1.7–7.7)
Neutrophils Relative %: 39 %
Platelets: 159 10*3/uL (ref 150–400)
RBC: 2.4 MIL/uL — ABNORMAL LOW (ref 4.22–5.81)
RDW: 21.6 % — ABNORMAL HIGH (ref 11.5–15.5)
WBC: 7.1 10*3/uL (ref 4.0–10.5)
nRBC: 5.2 % — ABNORMAL HIGH (ref 0.0–0.2)

## 2021-03-09 LAB — COMPREHENSIVE METABOLIC PANEL
ALT: 20 U/L (ref 0–44)
AST: 34 U/L (ref 15–41)
Albumin: 3.7 g/dL (ref 3.5–5.0)
Alkaline Phosphatase: 201 U/L — ABNORMAL HIGH (ref 38–126)
Anion gap: 8 (ref 5–15)
BUN: 17 mg/dL (ref 6–20)
CO2: 25 mmol/L (ref 22–32)
Calcium: 9.2 mg/dL (ref 8.9–10.3)
Chloride: 108 mmol/L (ref 98–111)
Creatinine, Ser: 1 mg/dL (ref 0.61–1.24)
GFR, Estimated: >60 mL/min (ref >60)
Glucose, Bld: 77 mg/dL (ref 70–99)
Potassium: 4 mmol/L (ref 3.5–5.1)
Sodium: 141 mmol/L (ref 135–145)
Total Bilirubin: 2.6 mg/dL — ABNORMAL HIGH (ref 0.3–1.2)
Total Protein: 6.7 g/dL (ref 6.5–8.1)

## 2021-03-09 LAB — RETICULOCYTES
Immature Retic Fract: 28.9 % — ABNORMAL HIGH (ref 2.3–15.9)
RBC.: 2.4 MIL/uL — ABNORMAL LOW (ref 4.22–5.81)
Retic Count, Absolute: 219.6 10*3/uL — ABNORMAL HIGH (ref 19.0–186.0)
Retic Ct Pct: 9.2 % — ABNORMAL HIGH (ref 0.4–3.1)

## 2021-03-09 MED ORDER — HYDROMORPHONE HCL 1 MG/ML IJ SOLN
1.0000 mg | Freq: Once | INTRAMUSCULAR | Status: AC
  Administered 2021-03-09: 1 mg via INTRAVENOUS
  Filled 2021-03-09: qty 1

## 2021-03-09 MED ORDER — DIPHENHYDRAMINE HCL 50 MG/ML IJ SOLN
12.5000 mg | Freq: Once | INTRAMUSCULAR | Status: AC
  Administered 2021-03-09: 12.5 mg via INTRAVENOUS
  Filled 2021-03-09: qty 1

## 2021-03-09 MED ORDER — KETOROLAC TROMETHAMINE 30 MG/ML IJ SOLN
30.0000 mg | Freq: Once | INTRAMUSCULAR | Status: AC
  Administered 2021-03-09: 30 mg via INTRAMUSCULAR
  Filled 2021-03-09: qty 1

## 2021-03-09 NOTE — ED Provider Notes (Signed)
Scott Norton is a 55 y.o. male, presenting to the ED with back pain.   HPI from Elpidio Anis, PA-C: "Scott Norton is a 55 y.o. male.  Patient with history of sickle cell disease, DDD, presents with back and bilateral shoulder pain. No fever, chest pain, cough, SOB, congestion. No nausea or vomiting. No urinary symptoms. He is taking ibuprofen without relief. He reports he usually takes oxycodone for his sickle cell pain, which can affect his back, but he is currently out of his prescription.   The history is provided by the patient. No language interpreter was used.  Back Pain Associated symptoms: no abdominal pain, no chest pain, no dysuria, no fever and no weakness    Physical Exam  BP 123/80 (BP Location: Left Arm)   Pulse 65   Temp 97.9 F (36.6 C) (Oral)   Resp 17   SpO2 93%   Physical Exam Vitals and nursing note reviewed.  Constitutional:      General: He is not in acute distress.    Appearance: He is well-developed. He is not diaphoretic.  HENT:     Head: Normocephalic and atraumatic.  Eyes:     Conjunctiva/sclera: Conjunctivae normal.  Cardiovascular:     Rate and Rhythm: Normal rate and regular rhythm.  Pulmonary:     Effort: Pulmonary effort is normal.  Musculoskeletal:     Cervical back: Neck supple.  Skin:    General: Skin is warm and dry.     Coloration: Skin is not pale.  Neurological:     Mental Status: He is alert.     Comments: Sensation grossly intact to light touch in the lower extremities bilaterally. No saddle anesthesias. Strength 5/5 in the bilateral lower extremities. No noted gait deficit. Coordination intact.  Psychiatric:        Behavior: Behavior normal.     ED Course/Procedures   Clinical Course as of 03/09/21 0843  Thu Mar 09, 2021  0720 Patient resting comfortably.  Sleeping. [SJ]  [id number] Patient states he feels much better and is ready for discharge. [SJ]    Clinical Course User Index [SJ] Atilano Covelli C, PA-C     Procedures  MDM   Took patient care handoff report from Elpidio Anis, [place]. Plan: Reassess patient.  Assure pain improves.  Patient presents with back pain.  No focal neurologic deficits. Patient is nontoxic appearing, afebrile, not tachycardic, not tachypneic, not hypotensive, maintains adequate SPO2 on room air, and is in no apparent distress.   I have reviewed the patient's chart to obtain more information.   I reviewed and interpreted the patient's labs. Patient's hemoglobin is consistent with what he tells [place] is his baseline.  Reticulocyte panel consistent with previous.  Total bilirubin slightly elevated above previous.  No abdominal complaints at this time. Pain significantly improved.  He has an appointment with his PCP today at 1 PM. Ambulated independently without gait deficit to the bathroom.  The patient was given instructions for home care as well as return precautions. Patient voices understanding of these instructions, accepts the plan, and is comfortable with discharge.  Vitals:   03/08/21 2308 03/09/21 0250 03/09/21 0645  BP: 118/67 123/80 120/60  Pulse: 76 65 64  Resp: 16 17 17   Temp: 98 F (36.7 C) 97.9 F (36.6 C)   TempSrc: Oral Oral   SpO2: 93% 93% 96%         03/09/21 0915    03/11/21, MD 03/09/21 1756

## 2021-03-09 NOTE — ED Provider Notes (Signed)
[facility]-EMERGENCY DEPT Provider Note   CSN: [id number] Arrival date & time: 03/08/21  2248     History Chief Complaint  Patient presents with  . Back Pain    Scott Norton is a 55 y.o. male.  Patient with history of sickle cell disease, DDD, presents with back and bilateral shoulder pain. No fever, chest pain, cough, SOB, congestion. No nausea or vomiting. No urinary symptoms. He is taking ibuprofen without relief. He reports he usually takes oxycodone for his sickle cell pain, which can affect his back, but he is currently out of his prescription.   The history is provided by the patient. No language interpreter was used.  Back Pain Associated symptoms: no abdominal pain, no chest pain, no dysuria, no fever and no weakness        Past Medical History:  Diagnosis Date  . AKI (acute kidney injury) (HCC) 02/08/2021  . Anemia   . Depression   . Heart murmur   . Seasonal allergies    wool & grass  . Sickle cell anemia [facility])     Patient Active Problem List   Diagnosis Date Noted  . Depression 03/01/2021  . Cocaine abuse with cocaine-induced mood disorder (HCC) 03/01/2021  . Sickle-cell crisis (HCC) 02/08/2021  . Hyperkalemia 02/08/2021  . Thrombocytopenia (HCC) 02/08/2021  . Leukocytosis 02/08/2021  . AKI (acute kidney injury) (HCC) 02/08/2021  . Cocaine abuse (HCC) 02/08/2021  . Homelessness 09/14/2020  . Suicidal ideations 09/04/2020  . Tobacco use 09/04/2020  . Sickle cell pain crisis (HCC) 04/11/2015  . Vitamin D deficiency 01/31/2015  . Chronic pain 06/30/2014  . Respiratory infection 08/13/2012  . Healthcare maintenance 06/04/2012  . Cervical strain, acute 12/09/2011  . Neck sprain and strain 12/09/2011  . Sickle cell anemia (HCC) 11/29/2011  . Seasonal allergies 11/29/2011  . Seasonal allergic rhinitis 11/29/2011    Past Surgical History:  Procedure Laterality Date  . NO PAST SURGERIES    . ROOT CANAL         Family History   Problem Relation Age of Onset  . Alcohol abuse Mother   . Sickle cell trait Mother   . Hypertension Mother   . Alcohol abuse Father   . Sickle cell trait Father   . Early death Father        due to alcoholism  . Hypertension Father   . Hypertension Sister   . Hypertension Brother   . Hypertension Maternal Grandmother   . Hypertension Maternal Grandfather   . Hypertension Paternal Grandmother   . Hypertension Paternal Grandfather     Social History   Tobacco Use  . Smoking status: Current Every Day Smoker    Packs/day: 0.50    Types: Cigars  . Smokeless tobacco: Never Used  Vaping Use  . Vaping Use: Never used  Substance Use Topics  . Alcohol use: Yes    Comment: occasional but indicated etoh abuse on health hx questionaire  . Drug use: Yes    Types: Marijuana, Cocaine    Home Medications Prior to Admission medications   Medication Sig Start Date End Date Taking? Authorizing Provider  escitalopram (LEXAPRO) 5 MG tablet Take 1 tablet (5 mg total) by mouth daily. Patient not taking: Reported on 03/09/2021 03/07/21   Laveda Abbe, NP  ibuprofen (ADVIL) 800 MG tablet Take 1 tablet (800 mg total) by mouth every 8 (eight) hours as needed. Patient not taking: Reported on 03/09/2021 03/06/21   Laveda Abbe, NP  multivitamin (  PROSIGHT) TABS tablet Take 1 tablet by mouth daily. Patient not taking: Reported on 03/09/2021 03/07/21   Laveda Abbe, NP  traZODone (DESYREL) 50 MG tablet Take 1 tablet (50 mg total) by mouth at bedtime as needed for sleep. Patient not taking: Reported on 03/09/2021 03/06/21   Laveda Abbe, NP    Allergies    Aspirin, Tape, and Tramadol  Review of Systems   Review of Systems  Constitutional: Negative for chills and fever.  HENT: Negative.   Respiratory: Negative for cough and shortness of breath.   Cardiovascular: Negative for chest pain.  Gastrointestinal: Negative for abdominal pain and nausea.  Genitourinary:  Negative.  Negative for dysuria.  Musculoskeletal: Positive for back pain.       See HPI.  Skin: Negative for pallor.  Neurological: Negative for weakness and light-headedness.    Physical Exam Updated Vital Signs BP 123/80 (BP Location: Left Arm)   Pulse 65   Temp 97.9 F (36.6 C) (Oral)   Resp 17   SpO2 93%   Physical Exam Vitals and nursing note reviewed.  Constitutional:      Appearance: He is well-developed.  Cardiovascular:     Rate and Rhythm: Normal rate.  Pulmonary:     Effort: Pulmonary effort is normal.  Chest:     Chest wall: No tenderness.  Abdominal:     Tenderness: There is no abdominal tenderness.  Musculoskeletal:        General: No swelling. Normal range of motion.     Cervical back: Normal range of motion.  Skin:    General: Skin is warm and dry.     Coloration: Skin is not pale.  Neurological:     Mental Status: He is alert and oriented to person, place, and time.     ED Results / Procedures / Treatments   Labs (all labs ordered are listed, but only abnormal results are displayed) Labs Reviewed  CBC WITH DIFFERENTIAL/PLATELET - Abnormal; Notable for the following components:      Result Value   RBC 2.40 (*)    Hemoglobin 7.5 (*)    HCT 22.2 (*)    RDW 21.6 (*)    nRBC 5.2 (*)    Monocytes Absolute 1.2 (*)    All other components within normal limits  COMPREHENSIVE METABOLIC PANEL - Abnormal; Notable for the following components:   Alkaline Phosphatase 201 (*)    Total Bilirubin 2.6 (*)    All other components within normal limits  RETICULOCYTES - Abnormal; Notable for the following components:   Retic Ct Pct 9.2 (*)    RBC. 2.40 (*)    Retic Count, Absolute 219.6 (*)    Immature Retic Fract 28.9 (*)    All other components within normal limits    EKG None  Radiology No results found.  Procedures Procedures   Medications Ordered in ED Medications  ketorolac (TORADOL) 30 MG/ML injection 30 mg (has no administration in time  range)  oxyCODONE-acetaminophen (PERCOCET/ROXICET) 5-325 MG per tablet 1 tablet (1 tablet Oral Given 03/09/21 0402)    ED Course  I have reviewed the triage vital signs and the nursing notes.  Pertinent labs & imaging results that were available during my care of the patient were reviewed by me and considered in my medical decision making (see chart for details).    MDM Rules/Calculators/A&P  Patient to ED with c/o back pain. H/O Sickle Cell disease with back pain in the past. Also h/o DDD with back pain.   He is overall comfortable appearing. VSS. Mild conjunctival pallor. He reports his pain is usually controlled with Percocet. Percocet is ordered to see if this will control pain, labs pending.   Hgb 7.5. He reports his baseline as 7.9, transfuse below 6. He reports Percocet did not provide pain relief. IM Toradol provided, which fails to provide adequate relief.   IV started. Will provide IV Dilaudid 1 mg, Benadryl 12.5 mg. He is sleeping on rechecks, reporting subjective pain when awoken. Feel this dosing is appropriate. Chart reviewed. The patient does not have extensive sickle cell admissions. Anticipate gaining control of pain and discharge home. If he continues to have significant pain, [facility] at 8:00 am.   Patient care signed out to oncoming provider team for disposition.    Final Clinical Impression(s) / ED Diagnoses Final diagnoses:  None   1. Back pain 2. H/o sickle cell anemia   Rx / DC Orders ED Discharge Orders    None       Elpidio Anis, PA-C 03/09/21 0556    Dione Booze, MD 03/09/21 [id number]

## 2021-03-11 ENCOUNTER — Emergency Department (HOSPITAL_COMMUNITY)
Admission: EM | Admit: 2021-03-11 | Discharge: 2021-03-12 | Disposition: A | Discharge status: Home / Self Care | Payer: Medicare Other | Source: Home / Self Care | Attending: Emergency Medicine | Admitting: Emergency Medicine

## 2021-03-11 ENCOUNTER — Encounter (HOSPITAL_COMMUNITY): Payer: Self-pay | Admitting: Emergency Medicine

## 2021-03-11 DIAGNOSIS — F17210 Nicotine dependence, cigarettes, uncomplicated: Secondary | ICD-10-CM | POA: Insufficient documentation

## 2021-03-11 DIAGNOSIS — D5700 Hb-SS disease with crisis, unspecified: Secondary | ICD-10-CM | POA: Insufficient documentation

## 2021-03-11 DIAGNOSIS — Z79899 Other long term (current) drug therapy: Secondary | ICD-10-CM | POA: Insufficient documentation

## 2021-03-11 LAB — CBC WITH DIFFERENTIAL/PLATELET
Abs Immature Granulocytes: 0.01 10*3/uL (ref 0.00–0.07)
Basophils Absolute: 0 10*3/uL (ref 0.0–0.1)
Basophils Relative: 0 %
Eosinophils Absolute: 0.2 10*3/uL (ref 0.0–0.5)
Eosinophils Relative: 4 %
HCT: 23.7 % — ABNORMAL LOW (ref 39.0–52.0)
Hemoglobin: 8 g/dL — ABNORMAL LOW (ref 13.0–17.0)
Immature Granulocytes: 0 %
Lymphocytes Relative: 45 %
Lymphs Abs: 2.8 10*3/uL (ref 0.7–4.0)
MCH: 31 pg (ref 26.0–34.0)
MCHC: 33.8 g/dL (ref 30.0–36.0)
MCV: 91.9 fL (ref 80.0–100.0)
Monocytes Absolute: 1.1 10*3/uL — ABNORMAL HIGH (ref 0.1–1.0)
Monocytes Relative: 18 %
Neutro Abs: 2.1 10*3/uL (ref 1.7–7.7)
Neutrophils Relative %: 33 %
Platelets: 184 10*3/uL (ref 150–400)
RBC: 2.58 MIL/uL — ABNORMAL LOW (ref 4.22–5.81)
RDW: 22 % — ABNORMAL HIGH (ref 11.5–15.5)
WBC: 6.4 10*3/uL (ref 4.0–10.5)
nRBC: 9.1 % — ABNORMAL HIGH (ref 0.0–0.2)

## 2021-03-11 LAB — COMPREHENSIVE METABOLIC PANEL
ALT: 19 U/L (ref 0–44)
AST: 33 U/L (ref 15–41)
Albumin: 3.8 g/dL (ref 3.5–5.0)
Alkaline Phosphatase: 221 U/L — ABNORMAL HIGH (ref 38–126)
Anion gap: 6 (ref 5–15)
BUN: 15 mg/dL (ref 6–20)
CO2: 25 mmol/L (ref 22–32)
Calcium: 9.2 mg/dL (ref 8.9–10.3)
Chloride: 107 mmol/L (ref 98–111)
Creatinine, Ser: 1.21 mg/dL (ref 0.61–1.24)
GFR, Estimated: >60 mL/min (ref >60)
Glucose, Bld: 82 mg/dL (ref 70–99)
Potassium: 4.4 mmol/L (ref 3.5–5.1)
Sodium: 138 mmol/L (ref 135–145)
Total Bilirubin: 2 mg/dL — ABNORMAL HIGH (ref 0.3–1.2)
Total Protein: 7.2 g/dL (ref 6.5–8.1)

## 2021-03-11 LAB — RETICULOCYTES
Immature Retic Fract: 21.4 % — ABNORMAL HIGH (ref 2.3–15.9)
RBC.: 2.61 MIL/uL — ABNORMAL LOW (ref 4.22–5.81)
Retic Count, Absolute: 327 10*3/uL — ABNORMAL HIGH (ref 19.0–186.0)
Retic Ct Pct: 13.1 % — ABNORMAL HIGH (ref 0.4–3.1)

## 2021-03-11 MED ORDER — KETOROLAC TROMETHAMINE 15 MG/ML IJ SOLN
15.0000 mg | Freq: Once | INTRAMUSCULAR | Status: AC
  Administered 2021-03-12: 15 mg via INTRAVENOUS
  Filled 2021-03-11: qty 1

## 2021-03-11 MED ORDER — HYDROMORPHONE HCL 1 MG/ML IJ SOLN
2.0000 mg | INTRAMUSCULAR | Status: AC | PRN
  Administered 2021-03-12 (×3): 2 mg via INTRAVENOUS
  Filled 2021-03-11 (×3): qty 2

## 2021-03-11 MED ORDER — SODIUM CHLORIDE 0.9 % IV BOLUS
1000.0000 mL | Freq: Once | INTRAVENOUS | Status: AC
  Administered 2021-03-12: 1000 mL via INTRAVENOUS

## 2021-03-11 NOTE — ED Triage Notes (Signed)
Pt c/o pain to bilateral shoulders and back. States related to his sickle cell pain.

## 2021-03-12 ENCOUNTER — Other Ambulatory Visit: Payer: Self-pay

## 2021-03-12 ENCOUNTER — Encounter (HOSPITAL_COMMUNITY): Payer: Self-pay | Admitting: Emergency Medicine

## 2021-03-12 ENCOUNTER — Emergency Department (HOSPITAL_COMMUNITY)
Admission: EM | Admit: 2021-03-12 | Discharge: 2021-03-12 | Disposition: A | Discharge status: Home / Self Care | Payer: Medicare Other | Source: Home / Self Care | Attending: Emergency Medicine | Admitting: Emergency Medicine

## 2021-03-12 ENCOUNTER — Emergency Department (HOSPITAL_COMMUNITY): Payer: Medicare Other

## 2021-03-12 DIAGNOSIS — F17290 Nicotine dependence, other tobacco product, uncomplicated: Secondary | ICD-10-CM | POA: Insufficient documentation

## 2021-03-12 DIAGNOSIS — G8929 Other chronic pain: Secondary | ICD-10-CM

## 2021-03-12 DIAGNOSIS — R112 Nausea with vomiting, unspecified: Secondary | ICD-10-CM | POA: Insufficient documentation

## 2021-03-12 DIAGNOSIS — M545 Low back pain, unspecified: Secondary | ICD-10-CM | POA: Insufficient documentation

## 2021-03-12 DIAGNOSIS — E860 Dehydration: Secondary | ICD-10-CM | POA: Insufficient documentation

## 2021-03-12 LAB — CBC WITH DIFFERENTIAL/PLATELET
Abs Immature Granulocytes: 0.03 10*3/uL (ref 0.00–0.07)
Basophils Absolute: 0 10*3/uL (ref 0.0–0.1)
Basophils Relative: 0 %
Eosinophils Absolute: 0 10*3/uL (ref 0.0–0.5)
Eosinophils Relative: 0 %
HCT: 21.6 % — ABNORMAL LOW (ref 39.0–52.0)
Hemoglobin: 7.4 g/dL — ABNORMAL LOW (ref 13.0–17.0)
Immature Granulocytes: 0 %
Lymphocytes Relative: 20 %
Lymphs Abs: 1.8 10*3/uL (ref 0.7–4.0)
MCH: 30.8 pg (ref 26.0–34.0)
MCHC: 34.3 g/dL (ref 30.0–36.0)
MCV: 90 fL (ref 80.0–100.0)
Monocytes Absolute: 1.2 10*3/uL — ABNORMAL HIGH (ref 0.1–1.0)
Monocytes Relative: 14 %
Neutro Abs: 5.8 10*3/uL (ref 1.7–7.7)
Neutrophils Relative %: 66 %
Platelets: 174 10*3/uL (ref 150–400)
RBC: 2.4 MIL/uL — ABNORMAL LOW (ref 4.22–5.81)
RDW: 22.2 % — ABNORMAL HIGH (ref 11.5–15.5)
WBC: 8.9 10*3/uL (ref 4.0–10.5)
nRBC: 6.5 % — ABNORMAL HIGH (ref 0.0–0.2)

## 2021-03-12 LAB — BASIC METABOLIC PANEL
Anion gap: 6 (ref 5–15)
BUN: 20 mg/dL (ref 6–20)
CO2: 23 mmol/L (ref 22–32)
Calcium: 9.1 mg/dL (ref 8.9–10.3)
Chloride: 109 mmol/L (ref 98–111)
Creatinine, Ser: 1.81 mg/dL — ABNORMAL HIGH (ref 0.61–1.24)
GFR, Estimated: 44 mL/min — ABNORMAL LOW (ref >60)
Glucose, Bld: 95 mg/dL (ref 70–99)
Potassium: 5.2 mmol/L — ABNORMAL HIGH (ref 3.5–5.1)
Sodium: 138 mmol/L (ref 135–145)

## 2021-03-12 MED ORDER — ONDANSETRON HCL 4 MG/2ML IJ SOLN
4.0000 mg | Freq: Once | INTRAMUSCULAR | Status: AC
  Administered 2021-03-12: 4 mg via INTRAVENOUS
  Filled 2021-03-12: qty 2

## 2021-03-12 MED ORDER — HYDROMORPHONE HCL 1 MG/ML IJ SOLN
1.0000 mg | Freq: Once | INTRAMUSCULAR | Status: AC
  Administered 2021-03-12: 1 mg via INTRAVENOUS
  Filled 2021-03-12: qty 1

## 2021-03-12 MED ORDER — SODIUM CHLORIDE 0.9 % IV BOLUS
1000.0000 mL | Freq: Once | INTRAVENOUS | Status: AC
  Administered 2021-03-12: 1000 mL via INTRAVENOUS

## 2021-03-12 MED ORDER — HYDROMORPHONE HCL 1 MG/ML IJ SOLN
1.0000 mg | Freq: Once | INTRAMUSCULAR | Status: AC
Start: 2021-03-12 — End: 2021-03-12
  Administered 2021-03-12: 1 mg via INTRAVENOUS
  Filled 2021-03-12: qty 1

## 2021-03-12 MED ORDER — KETOROLAC TROMETHAMINE 30 MG/ML IJ SOLN
30.0000 mg | Freq: Once | INTRAMUSCULAR | Status: AC
  Administered 2021-03-12: 30 mg via INTRAVENOUS
  Filled 2021-03-12: qty 1

## 2021-03-12 MED ORDER — DEXAMETHASONE SODIUM PHOSPHATE 10 MG/ML IJ SOLN
10.0000 mg | Freq: Once | INTRAMUSCULAR | Status: AC
  Administered 2021-03-12: 10 mg via INTRAVENOUS
  Filled 2021-03-12: qty 1

## 2021-03-12 MED ORDER — ONDANSETRON 4 MG PO TBDP
4.0000 mg | ORAL_TABLET | Freq: Three times a day (TID) | ORAL | 0 refills | Status: DC | PRN
  Filled 2021-03-12: qty 10, 3d supply, fill #0

## 2021-03-12 NOTE — ED Provider Notes (Signed)
[facility] EMERGENCY DEPARTMENT Provider Note   CSN: [id number] Arrival date & time: 03/12/21  1232     History Chief Complaint  Patient presents with  . Back Pain    Scott Norton is a 55 y.o. male.  Pt presents to the ED today with back pain.  The pt has a hx of sickle cell disease.  He was in the ED overnight and was treated with IV dilaudid.  He said he did not feel right when he left and threw up in the parking lot.  Pt said he still feels nauseous and his back still hurts.  His back pain is a chronic issue.  No f/c.  No sob or cough.        Past Medical History:  Diagnosis Date  . AKI (acute kidney injury) (HCC) 02/08/2021  . Anemia   . Depression   . Heart murmur   . Seasonal allergies    wool & grass  . Sickle cell anemia [facility])     Patient Active Problem List   Diagnosis Date Noted  . Depression 03/01/2021  . Cocaine abuse with cocaine-induced mood disorder (HCC) 03/01/2021  . Sickle-cell crisis (HCC) 02/08/2021  . Hyperkalemia 02/08/2021  . Thrombocytopenia (HCC) 02/08/2021  . Leukocytosis 02/08/2021  . AKI (acute kidney injury) (HCC) 02/08/2021  . Cocaine abuse (HCC) 02/08/2021  . Homelessness 09/14/2020  . Suicidal ideations 09/04/2020  . Tobacco use 09/04/2020  . Sickle cell pain crisis (HCC) 04/11/2015  . Vitamin D deficiency 01/31/2015  . Chronic pain 06/30/2014  . Respiratory infection 08/13/2012  . Healthcare maintenance 06/04/2012  . Cervical strain, acute 12/09/2011  . Neck sprain and strain 12/09/2011  . Sickle cell anemia (HCC) 11/29/2011  . Seasonal allergies 11/29/2011  . Seasonal allergic rhinitis 11/29/2011    Past Surgical History:  Procedure Laterality Date  . NO PAST SURGERIES    . ROOT CANAL         Family History  Problem Relation Age of Onset  . Alcohol abuse Mother   . Sickle cell trait Mother   . Hypertension Mother   . Alcohol abuse Father   . Sickle cell trait Father   . Early death Father         due to alcoholism  . Hypertension Father   . Hypertension Sister   . Hypertension Brother   . Hypertension Maternal Grandmother   . Hypertension Maternal Grandfather   . Hypertension Paternal Grandmother   . Hypertension Paternal Grandfather     Social History   Tobacco Use  . Smoking status: Current Every Day Smoker    Packs/day: 0.50    Types: Cigars  . Smokeless tobacco: Never Used  Vaping Use  . Vaping Use: Never used  Substance Use Topics  . Alcohol use: Yes    Comment: occasional but indicated etoh abuse on health hx questionaire  . Drug use: Yes    Types: Marijuana, Cocaine    Home Medications Prior to Admission medications   Medication Sig Start Date End Date Taking? Authorizing Provider  ondansetron (ZOFRAN ODT) 4 MG disintegrating tablet Take 1 tablet (4 mg total) by mouth every 8 (eight) hours as needed for nausea or vomiting. 03/12/21  Yes Jacalyn Lefevre, MD  escitalopram (LEXAPRO) 5 MG tablet Take 1 tablet (5 mg total) by mouth daily. Patient not taking: No sig reported 03/07/21   Laveda Abbe, NP  ibuprofen (ADVIL) 800 MG tablet Take 1 tablet (800 mg total) by mouth every  8 (eight) hours as needed. Patient not taking: No sig reported 03/06/21   Laveda Abbe, NP  multivitamin (PROSIGHT) TABS tablet Take 1 tablet by mouth daily. Patient not taking: No sig reported 03/07/21   Laveda Abbe, NP  Oxycodone HCl 10 MG TABS Take 10 mg by mouth 3 (three) times daily as needed (pain). 03/09/21   [provider]  traZODone (DESYREL) 50 MG tablet Take 1 tablet (50 mg total) by mouth at bedtime as needed for sleep. Patient not taking: No sig reported 03/06/21   Laveda Abbe, NP    Allergies    Aspirin, Tape, and Tramadol  Review of Systems   Review of Systems  Gastrointestinal: Positive for nausea and vomiting.  Musculoskeletal: Positive for back pain.  All other systems reviewed and are negative.   Physical Exam Updated  Vital Signs BP 114/60 (BP Location: Left Arm)   Pulse 70   Temp 97.9 F (36.6 C) (Oral)   Resp 17   Ht 6\' 3"  (1.905 m)   Wt 73.5 kg   SpO2 98%   BMI 20.25 kg/m   Physical Exam Vitals and nursing note reviewed.  Constitutional:      Appearance: Normal appearance.  HENT:     Head: Normocephalic and atraumatic.     Right Ear: External ear normal.     Left Ear: External ear normal.     Nose: Nose normal.     Mouth/Throat:     Mouth: Mucous membranes are moist.     Pharynx: Oropharynx is clear.  Eyes:     Extraocular Movements: Extraocular movements intact.     Conjunctiva/sclera: Conjunctivae normal.     Pupils: Pupils are equal, round, and reactive to light.  Cardiovascular:     Rate and Rhythm: Normal rate and regular rhythm.     Pulses: Normal pulses.     Heart sounds: Normal heart sounds.  Pulmonary:     Effort: Pulmonary effort is normal.     Breath sounds: Normal breath sounds.  Abdominal:     General: Abdomen is flat. Bowel sounds are normal.     Palpations: Abdomen is soft.  Musculoskeletal:     Cervical back: Normal range of motion and neck supple.       Back:  Skin:    General: Skin is warm.     Capillary Refill: Capillary refill takes less than 2 seconds.  Neurological:     General: No focal deficit present.     Mental Status: He is alert and oriented to person, place, and time.  Psychiatric:        Mood and Affect: Mood normal.        Behavior: Behavior normal.        Thought Content: Thought content normal.        Judgment: Judgment normal.     ED Results / Procedures / Treatments   Labs (all labs ordered are listed, but only abnormal results are displayed) Labs Reviewed  CBC WITH DIFFERENTIAL/PLATELET - Abnormal; Notable for the following components:      Result Value   RBC 2.40 (*)    Hemoglobin 7.4 (*)    HCT 21.6 (*)    RDW 22.2 (*)    nRBC 6.5 (*)    Monocytes Absolute 1.2 (*)    All other components within normal limits  BASIC  METABOLIC PANEL - Abnormal; Notable for the following components:   Potassium 5.2 (*)    Creatinine, Ser 1.81 (*)  GFR, Estimated 44 (*)    All other components within normal limits  PATHOLOGIST SMEAR REVIEW    EKG None  Radiology DG Lumbar Spine Complete  Result Date: 03/12/2021 CLINICAL DATA:  55 year old male with low back pain. EXAM: LUMBAR SPINE - COMPLETE 4+ VIEW COMPARISON:  12/26/2020 radiographs FINDINGS: Five non rib-bearing lumbar type vertebra are identified in normal alignment. There is no evidence of fracture or subluxation. No focal bony lesions or spondylolysis noted. Disc spaces are maintained. Heavy aortic atherosclerotic calcifications are present. IMPRESSION: Unremarkable lumbar spine. Aortic Atherosclerosis (ICD10-I70.0). Electronically Signed   By: Harmon Pier M.D.   On: 03/12/2021 13:35    Procedures Procedures   Medications Ordered in ED Medications  sodium chloride 0.9 % bolus 1,000 mL (0 mLs Intravenous Stopped 03/12/21 1803)  ondansetron (ZOFRAN) injection 4 mg (4 mg Intravenous Given 03/12/21 1724)  ketorolac (TORADOL) 30 MG/ML injection 30 mg (30 mg Intravenous Given 03/12/21 1721)  dexamethasone (DECADRON) injection 10 mg (10 mg Intravenous Given 03/12/21 1722)  sodium chloride 0.9 % bolus 1,000 mL (1,000 mLs Intravenous New Bag/Given 03/12/21 1831)  HYDROmorphone (DILAUDID) injection 1 mg (1 mg Intravenous Given 03/12/21 1831)    ED Course  I have reviewed the triage vital signs and the nursing notes.  Pertinent labs & imaging results that were available during my care of the patient were reviewed by me and considered in my medical decision making (see chart for details).    MDM Rules/Calculators/A&P                         Pt is anemic, but it is chronic from his sickle cell.  Cr is slightly up; likely from vomiting.   Pt is feeling much better after meds.  He is dehydrated from vomiting, but he is now able to tolerate crackers and drinks.  He is  stable for d/c.  Return if worse. Final Clinical Impression(s) / ED Diagnoses Final diagnoses:  Chronic bilateral low back pain without sciatica  Dehydration  Non-intractable vomiting with nausea, unspecified vomiting type    Rx / DC Orders ED Discharge Orders         Ordered    ondansetron (ZOFRAN ODT) 4 MG disintegrating tablet  Every 8 hours PRN        03/12/21 2014           Jacalyn Lefevre, MD 03/12/21 2019

## 2021-03-12 NOTE — ED Notes (Addendum)
PRN Dilaudid given per pt request

## 2021-03-12 NOTE — ED Notes (Signed)
Pt taken off o2 

## 2021-03-12 NOTE — Discharge Instructions (Addendum)
You were evaluated in the Emergency Department and after careful evaluation, we did not find any emergent condition requiring admission or further testing in the hospital.  Your exam/testing today was overall reassuring.  Please return to the Emergency Department if you experience any worsening of your condition.  Thank you for allowing us to be a part of your care.  

## 2021-03-12 NOTE — ED Provider Notes (Signed)
[facility]-EMERGENCY DEPT [facility] Emergency Department Provider Note MRN:  [id number]  Arrival date & time: 03/12/21     Chief Complaint   Sickle Cell Pain Crisis   History of Present Illness   Scott Norton is a 55 y.o. year-old male with a history of sickle cell anemia presenting to the ED with chief complaint of sickle cell pain crisis.  Pain in the legs, back, hips for the past 2 or 3 days, much worse today.  Pain is currently 8 out of 10, constant, dull ache.  His home medications were lost a few days ago.  Denies fever, no chest pain or shortness of breath, no abdominal pain.  No exacerbating or alleviating factors.  Review of Systems  A complete 10 system review of systems was obtained and all systems are negative except as noted in the HPI and PMH.   Patient's Health History    Past Medical History:  Diagnosis Date  . AKI (acute kidney injury) (HCC) 02/08/2021  . Anemia   . Depression   . Heart murmur   . Seasonal allergies    wool & grass  . Sickle cell anemia (HCC)     Past Surgical History:  Procedure Laterality Date  . NO PAST SURGERIES    . ROOT CANAL      Family History  Problem Relation Age of Onset  . Alcohol abuse Mother   . Sickle cell trait Mother   . Hypertension Mother   . Alcohol abuse Father   . Sickle cell trait Father   . Early death Father        due to alcoholism  . Hypertension Father   . Hypertension Sister   . Hypertension Brother   . Hypertension Maternal Grandmother   . Hypertension Maternal Grandfather   . Hypertension Paternal Grandmother   . Hypertension Paternal Grandfather     Social History   Socioeconomic History  . Marital status: Single    Spouse name: Not on file  . Number of children: Not on file  . Years of education: Not on file  . Highest education level: Not on file  Occupational History  . Not on file  Tobacco Use  . Smoking status: Current Every Day Smoker    Packs/day: 0.50    Types: Cigars  .  Smokeless tobacco: Never Used  Vaping Use  . Vaping Use: Never used  Substance and Sexual Activity  . Alcohol use: Yes    Comment: occasional but indicated etoh abuse on health hx questionaire  . Drug use: Yes    Types: Marijuana, Cocaine  . Sexual activity: Yes  Other Topics Concern  . Not on file  Social History Narrative   Lives with Friends in [place]. Working on housing for himself   On disability for his SS disease      No children, no stable relationship   Social Determinants of Corporate investment banker Strain: Not on file  Food Insecurity: Not on file  Transportation Needs: Not on file  Physical Activity: Not on file  Stress: Not on file  Social Connections: Not on file  Intimate Partner Violence: Not on file     Physical Exam   Vitals:   03/12/21 0415 03/12/21 0530  BP: (!) 142/60 122/70  Pulse: 65 60  Resp: 18 17  Temp:    SpO2: 99% 94%    CONSTITUTIONAL: Well-appearing, NAD NEURO:  Alert and oriented x 3, no focal deficits EYES:  eyes equal and reactive  ENT/NECK:  no LAD, no JVD CARDIO: Regular rate, well-perfused, normal S1 and S2 PULM:  CTAB no wheezing or rhonchi GI/GU:  normal bowel sounds, non-distended, non-tender MSK/SPINE:  No gross deformities, no edema SKIN:  no rash, atraumatic PSYCH:  Appropriate speech and behavior  *Additional and/or pertinent findings included in MDM below  Diagnostic and Interventional Summary    EKG Interpretation  Date/Time:    Ventricular Rate:    PR Interval:    QRS Duration:   QT Interval:    QTC Calculation:   R Axis:     Text Interpretation:        Labs Reviewed  COMPREHENSIVE METABOLIC PANEL - Abnormal; Notable for the following components:      Result Value   Alkaline Phosphatase 221 (*)    Total Bilirubin 2.0 (*)    All other components within normal limits  CBC WITH DIFFERENTIAL/PLATELET - Abnormal; Notable for the following components:   RBC 2.58 (*)    Hemoglobin 8.0 (*)    HCT 23.7 (*)     RDW 22.0 (*)    nRBC 9.1 (*)    Monocytes Absolute 1.1 (*)    All other components within normal limits  RETICULOCYTES - Abnormal; Notable for the following components:   Retic Ct Pct 13.1 (*)    RBC. 2.61 (*)    Retic Count, Absolute 327.0 (*)    Immature Retic Fract 21.4 (*)    All other components within normal limits  PATHOLOGIST SMEAR REVIEW    No orders to display    Medications  HYDROmorphone (DILAUDID) injection 1 mg (has no administration in time range)  HYDROmorphone (DILAUDID) injection 2 mg (2 mg Intravenous Given 03/12/21 0520)  ketorolac (TORADOL) 15 MG/ML injection 15 mg (15 mg Intravenous Given 03/12/21 0036)  sodium chloride 0.9 % bolus 1,000 mL (1,000 mLs Intravenous New Bag/Given 03/12/21 0034)     Procedures  /  Critical Care Procedures  ED Course and Medical Decision Making  I have reviewed the triage vital signs, the nursing notes, and pertinent available records from the EMR.  Listed above are laboratory and imaging tests that I personally ordered, reviewed, and interpreted and then considered in my medical decision making (see below for details).  Sickle cell pain crisis versus chronic pain presentation, no red flag symptoms to suggest sepsis or acute chest, benign abdomen, will attempt symptomatic management and discharge.     Patient's pain is much better controlled, appropriate for discharge with follow-up at the outpatient center.  Elmer Sow. Pilar Plate, MD [facility] Emergency Medicine [facility] [email or web address]  Final Clinical Impressions(s) / ED Diagnoses     ICD-10-CM   1. Sickle cell pain crisis [facility])  D57.00     ED Discharge Orders    None       Discharge Instructions Discussed with and Provided to Patient:     Discharge Instructions     You were evaluated in the Emergency Department and after careful evaluation, we did not find any emergent condition requiring admission or further testing in the  hospital.  Your exam/testing today was overall reassuring.  Please return to the Emergency Department if you experience any worsening of your condition.  Thank you for allowing [place] to be a part of your care.        Sabas Sous, MD 03/12/21 [phone]

## 2021-03-12 NOTE — ED Provider Notes (Signed)
Emergency Medicine Provider Triage Evaluation Note  Scott Norton 55 y.o. male was evaluated in triage.  Pt complains of lower back pain.  Patient also reports pain to his right upper back.  Reports he was seen here last night and was told to come back if anything worsen.  He reports he went to bring what he felt like "his nerves got to him."  He states that he almost fell.  Denies any trauma, injury.  States he has had worsening pain in his back, prompting ED visit.  No numbness/weakness of his arms or legs.  No chest pain, difficulty breathing, abdominal pain.   Review of Systems  Positive: Back pain Negative: Chest pain, difficulty breathing, abdominal pain, numbness/weakness.  Physical Exam  BP 134/82   Pulse 70   Temp 98.2 F (36.8 C) (Oral)   Resp 18   Ht 5\' 4"  (1.626 m)   Wt 65.8 kg   SpO2 100%   BMI 24.89 kg/m  Gen:   Awake, no distress   HEENT:  Atraumatic  Resp:  Normal effort  Cardiac:  Normal rate  Abd:   Nondistended, nontender  MSK:   Moves extremities without difficulty.  No midline C or T-spine tenderness.  Diffuse tenderness palpation in the right paraspinal muscles of thoracic region.  Diffuse tenderness palpation of the lower lumbar region. Neuro:  Speech clear   Other:   5/5 strength of bilateral upper and lower extremity.  Medical Decision Making  Medically screening exam initiated at 1:07 PM  Appropriate orders placed.  Scott Norton was informed that the remainder of the evaluation will be completed by another provider, this initial triage assessment does not replace that evaluation. They are counseled that they will need to remain in the ED until the completion of their workup, including full H&P and results of any tests.  Risks of leaving the emergency department prior to completion of treatment were discussed. Patient was advised to inform ED staff if they are leaving before their treatment is complete. The patient acknowledged these risks and time was allowed  for questions.     The patient appears stable so that the remainder of the MSE may be completed by another provider.   Clinical Impression  Back pain   Portions of this note were generated with Dragon dictation software. Dictation errors may occur despite best attempts at proofreading.      Scott Cruel, PA-C 03/12/21 1307    03/14/21, MD 03/12/21 1311

## 2021-03-12 NOTE — ED Notes (Signed)
E-signature pad unavailable at time of pt discharge. This RN discussed discharge materials with pt and answered all pt questions. Pt stated understanding of discharge material. ? ?

## 2021-03-12 NOTE — ED Triage Notes (Signed)
Patient complaint of back pain. Endorses hx of sickle cell.

## 2021-03-12 NOTE — ED Notes (Signed)
Pt was 86% on RA, placed on 2L

## 2021-03-13 ENCOUNTER — Inpatient Hospital Stay (HOSPITAL_COMMUNITY)
Admission: EM | Admit: 2021-03-13 | Discharge: 2021-03-16 | DRG: 291 | Disposition: A | Discharge status: Home / Self Care | Payer: Medicare Other | Attending: Internal Medicine | Admitting: Internal Medicine

## 2021-03-13 ENCOUNTER — Encounter (HOSPITAL_COMMUNITY): Payer: Self-pay

## 2021-03-13 ENCOUNTER — Inpatient Hospital Stay (HOSPITAL_COMMUNITY): Payer: Medicare Other

## 2021-03-13 ENCOUNTER — Other Ambulatory Visit (HOSPITAL_COMMUNITY): Payer: Self-pay

## 2021-03-13 ENCOUNTER — Emergency Department (HOSPITAL_COMMUNITY): Payer: Medicare Other

## 2021-03-13 ENCOUNTER — Other Ambulatory Visit: Payer: Self-pay

## 2021-03-13 DIAGNOSIS — Z720 Tobacco use: Secondary | ICD-10-CM | POA: Diagnosis present

## 2021-03-13 DIAGNOSIS — Z886 Allergy status to analgesic agent status: Secondary | ICD-10-CM | POA: Diagnosis not present

## 2021-03-13 DIAGNOSIS — F32A Depression, unspecified: Secondary | ICD-10-CM | POA: Diagnosis present

## 2021-03-13 DIAGNOSIS — F17210 Nicotine dependence, cigarettes, uncomplicated: Secondary | ICD-10-CM | POA: Diagnosis present

## 2021-03-13 DIAGNOSIS — Z5900 Homelessness unspecified: Secondary | ICD-10-CM | POA: Diagnosis not present

## 2021-03-13 DIAGNOSIS — F1410 Cocaine abuse, uncomplicated: Secondary | ICD-10-CM | POA: Diagnosis present

## 2021-03-13 DIAGNOSIS — E860 Dehydration: Secondary | ICD-10-CM | POA: Diagnosis present

## 2021-03-13 DIAGNOSIS — I110 Hypertensive heart disease with heart failure: Principal | ICD-10-CM | POA: Diagnosis present

## 2021-03-13 DIAGNOSIS — G8929 Other chronic pain: Secondary | ICD-10-CM | POA: Diagnosis present

## 2021-03-13 DIAGNOSIS — R45851 Suicidal ideations: Secondary | ICD-10-CM | POA: Diagnosis present

## 2021-03-13 DIAGNOSIS — Z20822 Contact with and (suspected) exposure to covid-19: Secondary | ICD-10-CM | POA: Diagnosis present

## 2021-03-13 DIAGNOSIS — F332 Major depressive disorder, recurrent severe without psychotic features: Secondary | ICD-10-CM | POA: Diagnosis present

## 2021-03-13 DIAGNOSIS — Z832 Family history of diseases of the blood and blood-forming organs and certain disorders involving the immune mechanism: Secondary | ICD-10-CM | POA: Diagnosis not present

## 2021-03-13 DIAGNOSIS — Z79899 Other long term (current) drug therapy: Secondary | ICD-10-CM | POA: Diagnosis not present

## 2021-03-13 DIAGNOSIS — I509 Heart failure, unspecified: Secondary | ICD-10-CM | POA: Diagnosis not present

## 2021-03-13 DIAGNOSIS — Z8249 Family history of ischemic heart disease and other diseases of the circulatory system: Secondary | ICD-10-CM

## 2021-03-13 DIAGNOSIS — Z6372 Alcoholism and drug addiction in family: Secondary | ICD-10-CM | POA: Diagnosis not present

## 2021-03-13 DIAGNOSIS — I5033 Acute on chronic diastolic (congestive) heart failure: Secondary | ICD-10-CM | POA: Diagnosis present

## 2021-03-13 DIAGNOSIS — I5040 Unspecified combined systolic (congestive) and diastolic (congestive) heart failure: Secondary | ICD-10-CM

## 2021-03-13 DIAGNOSIS — Z8659 Personal history of other mental and behavioral disorders: Secondary | ICD-10-CM | POA: Diagnosis not present

## 2021-03-13 DIAGNOSIS — D571 Sickle-cell disease without crisis: Secondary | ICD-10-CM | POA: Diagnosis present

## 2021-03-13 DIAGNOSIS — J9601 Acute respiratory failure with hypoxia: Secondary | ICD-10-CM | POA: Diagnosis present

## 2021-03-13 DIAGNOSIS — Z91048 Other nonmedicinal substance allergy status: Secondary | ICD-10-CM | POA: Diagnosis not present

## 2021-03-13 DIAGNOSIS — Z885 Allergy status to narcotic agent status: Secondary | ICD-10-CM | POA: Diagnosis not present

## 2021-03-13 DIAGNOSIS — F1290 Cannabis use, unspecified, uncomplicated: Secondary | ICD-10-CM | POA: Diagnosis not present

## 2021-03-13 LAB — CBC
HCT: 18.4 % — ABNORMAL LOW (ref 39.0–52.0)
Hemoglobin: 6.4 g/dL — CL (ref 13.0–17.0)
MCH: 30.9 pg (ref 26.0–34.0)
MCHC: 34.8 g/dL (ref 30.0–36.0)
MCV: 88.9 fL (ref 80.0–100.0)
Platelets: 171 10*3/uL (ref 150–400)
RBC: 2.07 MIL/uL — ABNORMAL LOW (ref 4.22–5.81)
RDW: 22.6 % — ABNORMAL HIGH (ref 11.5–15.5)
WBC: 6.7 10*3/uL (ref 4.0–10.5)
nRBC: 9.1 % — ABNORMAL HIGH (ref 0.0–0.2)

## 2021-03-13 LAB — COMPREHENSIVE METABOLIC PANEL
ALT: 17 U/L (ref 0–44)
AST: 33 U/L (ref 15–41)
Albumin: 3.7 g/dL (ref 3.5–5.0)
Alkaline Phosphatase: 183 U/L — ABNORMAL HIGH (ref 38–126)
Anion gap: 5 (ref 5–15)
BUN: 22 mg/dL — ABNORMAL HIGH (ref 6–20)
CO2: 22 mmol/L (ref 22–32)
Calcium: 8.9 mg/dL (ref 8.9–10.3)
Chloride: 112 mmol/L — ABNORMAL HIGH (ref 98–111)
Creatinine, Ser: 1.14 mg/dL (ref 0.61–1.24)
GFR, Estimated: >60 mL/min (ref >60)
Glucose, Bld: 118 mg/dL — ABNORMAL HIGH (ref 70–99)
Potassium: 4.9 mmol/L (ref 3.5–5.1)
Sodium: 139 mmol/L (ref 135–145)
Total Bilirubin: 2 mg/dL — ABNORMAL HIGH (ref 0.3–1.2)
Total Protein: 6.7 g/dL (ref 6.5–8.1)

## 2021-03-13 LAB — ECHOCARDIOGRAM COMPLETE
AR max vel: 3.09 cm2
AV Area VTI: 3.2 cm2
AV Area mean vel: 2.91 cm2
AV Mean grad: 11 mmHg
AV Peak grad: 18.5 mmHg
Ao pk vel: 2.15 m/s
Area-P 1/2: 3.77 cm2
Calc EF: 62.1 %
Height: 75 in
MV M vel: 5.51 m/s
MV Peak grad: 121.4 mmHg
P 1/2 time: 738 msec
Radius: 0.77 cm
S' Lateral: 3.94 cm
Single Plane A2C EF: 70.6 %
Single Plane A4C EF: 59.9 %
Weight: 2704 oz

## 2021-03-13 LAB — CBC WITH DIFFERENTIAL/PLATELET
Abs Immature Granulocytes: 0.05 10*3/uL (ref 0.00–0.07)
Basophils Absolute: 0 10*3/uL (ref 0.0–0.1)
Basophils Relative: 0 %
Eosinophils Absolute: 0 10*3/uL (ref 0.0–0.5)
Eosinophils Relative: 0 %
HCT: 19 % — ABNORMAL LOW (ref 39.0–52.0)
Hemoglobin: 6.5 g/dL — CL (ref 13.0–17.0)
Immature Granulocytes: 1 %
Lymphocytes Relative: 19 %
Lymphs Abs: 1.1 10*3/uL (ref 0.7–4.0)
MCH: 30.7 pg (ref 26.0–34.0)
MCHC: 34.2 g/dL (ref 30.0–36.0)
MCV: 89.6 fL (ref 80.0–100.0)
Monocytes Absolute: 0.4 10*3/uL (ref 0.1–1.0)
Monocytes Relative: 7 %
Neutro Abs: 4.1 10*3/uL (ref 1.7–7.7)
Neutrophils Relative %: 73 %
Platelets: 182 10*3/uL (ref 150–400)
RBC: 2.12 MIL/uL — ABNORMAL LOW (ref 4.22–5.81)
RDW: 22.8 % — ABNORMAL HIGH (ref 11.5–15.5)
WBC: 5.6 10*3/uL (ref 4.0–10.5)
nRBC: 10.8 % — ABNORMAL HIGH (ref 0.0–0.2)

## 2021-03-13 LAB — RAPID URINE DRUG SCREEN, HOSP PERFORMED
Amphetamines: NOT DETECTED
Barbiturates: NOT DETECTED
Benzodiazepines: NOT DETECTED
Cocaine: POSITIVE — AB
Opiates: NOT DETECTED
Tetrahydrocannabinol: NOT DETECTED

## 2021-03-13 LAB — RETICULOCYTES
Immature Retic Fract: 16.4 % — ABNORMAL HIGH (ref 2.3–15.9)
RBC.: 2.15 MIL/uL — ABNORMAL LOW (ref 4.22–5.81)
Retic Count, Absolute: 504 10*3/uL — ABNORMAL HIGH (ref 19.0–186.0)
Retic Ct Pct: 24 % — ABNORMAL HIGH (ref 0.4–3.1)

## 2021-03-13 LAB — TROPONIN I (HIGH SENSITIVITY)
Troponin I (High Sensitivity): 6 ng/L (ref <18)
Troponin I (High Sensitivity): 6 ng/L (ref <18)

## 2021-03-13 LAB — PATHOLOGIST SMEAR REVIEW

## 2021-03-13 LAB — CREATININE, SERUM
Creatinine, Ser: 1.08 mg/dL (ref 0.61–1.24)
GFR, Estimated: >60 mL/min (ref >60)

## 2021-03-13 LAB — RESP PANEL BY RT-PCR (FLU A&B, COVID) ARPGX2
Influenza A by PCR: NEGATIVE
Influenza B by PCR: NEGATIVE
SARS Coronavirus 2 by RT PCR: NEGATIVE

## 2021-03-13 LAB — BRAIN NATRIURETIC PEPTIDE: B Natriuretic Peptide: 325.9 pg/mL — ABNORMAL HIGH (ref 0.0–100.0)

## 2021-03-13 MED ORDER — ONDANSETRON HCL 4 MG/2ML IJ SOLN: 4.0000 mg | Freq: Four times a day (QID) | INTRAMUSCULAR | Status: DC | PRN

## 2021-03-13 MED ORDER — ACETAMINOPHEN 325 MG PO TABS: 650.0000 mg | ORAL_TABLET | ORAL | Status: DC | PRN

## 2021-03-13 MED ORDER — FUROSEMIDE 10 MG/ML IJ SOLN
20.0000 mg | Freq: Every day | INTRAMUSCULAR | Status: DC
  Administered 2021-03-13 – 2021-03-15 (×3): 20 mg via INTRAVENOUS
  Filled 2021-03-13 (×2): qty 2
  Filled 2021-03-13: qty 4

## 2021-03-13 MED ORDER — SODIUM CHLORIDE 0.9 % IV SOLN: 250.0000 mL | INTRAVENOUS | Status: DC | PRN

## 2021-03-13 MED ORDER — SODIUM CHLORIDE 0.9% FLUSH: 3.0000 mL | INTRAVENOUS | Status: DC | PRN

## 2021-03-13 MED ORDER — OXYCODONE HCL 5 MG PO TABS
10.0000 mg | ORAL_TABLET | Freq: Three times a day (TID) | ORAL | Status: DC | PRN
  Administered 2021-03-13 – 2021-03-16 (×4): 10 mg via ORAL
  Filled 2021-03-13 (×5): qty 2

## 2021-03-13 MED ORDER — ENOXAPARIN SODIUM 40 MG/0.4ML IJ SOSY
40.0000 mg | PREFILLED_SYRINGE | INTRAMUSCULAR | Status: DC
  Administered 2021-03-13 – 2021-03-14 (×2): 40 mg via SUBCUTANEOUS
  Filled 2021-03-13 (×3): qty 0.4

## 2021-03-13 MED ORDER — SODIUM CHLORIDE 0.9% FLUSH
3.0000 mL | Freq: Two times a day (BID) | INTRAVENOUS | Status: DC
  Administered 2021-03-13 – 2021-03-15 (×5): 3 mL via INTRAVENOUS

## 2021-03-13 NOTE — H&P (Signed)
Scott Norton is an 55 y.o. male.   Chief Complaint: Shortness of breath with exertion  HPI: Patient has history of sickle cell anemia with baseline hemoglobin between 6 and 7.  Has also been homeless and has multiple medical problems.  He came to the ER with complaint of the exertional dyspnea.  No pain.  Patient also was suicidal.  He was noted to have oxygen saturation in the 80s on room air.  Patient has been placed on 2 L with oxygen saturation in the upper 90s.  He has underlying history of hypertension, cocaine abuse, alcohol abuse and other polysubstance abuse.  Also a smoker.  Denied any chest pain.  No prior history of's CVA.  No prior history of coronary artery disease.  Patient being admitted to the hospital for further evaluation and treatment.  Past Medical History:  Diagnosis Date  . AKI (acute kidney injury) (HCC) 02/08/2021  . Anemia   . Depression   . Heart murmur   . Seasonal allergies    wool & grass  . Sickle cell anemia (HCC)     Past Surgical History:  Procedure Laterality Date  . NO PAST SURGERIES    . ROOT CANAL      Family History  Problem Relation Age of Onset  . Alcohol abuse Mother   . Sickle cell trait Mother   . Hypertension Mother   . Alcohol abuse Father   . Sickle cell trait Father   . Early death Father        due to alcoholism  . Hypertension Father   . Hypertension Sister   . Hypertension Brother   . Hypertension Maternal Grandmother   . Hypertension Maternal Grandfather   . Hypertension Paternal Grandmother   . Hypertension Paternal Grandfather    Social History:  reports that he has been smoking cigars. He has been smoking about 0.50 packs per day. He has never used smokeless tobacco. He reports current alcohol use. He reports current drug use. Drugs: Marijuana and Cocaine.  Allergies:  Allergies  Allergen Reactions  . Aspirin Other (See Comments)    Increased heart rate  . Tape Other (See Comments)    rash  . Tramadol Hives and  Itching    (Not in a hospital admission)   Results for orders placed or performed during the hospital encounter of 03/13/21 (from the past 48 hour(s))  Resp Panel by RT-PCR (Flu A&B, Covid) Nasopharyngeal Swab     Status: None   Collection Time: 03/13/21  5:18 AM   Specimen: Nasopharyngeal Swab; Nasopharyngeal(NP) swabs in vial transport medium  Result Value Ref Range   SARS Coronavirus 2 by RT PCR NEGATIVE NEGATIVE    Comment: (NOTE) SARS-CoV-2 target nucleic acids are NOT DETECTED.  The SARS-CoV-2 RNA is generally detectable in upper respiratory specimens during the acute phase of infection. The lowest concentration of SARS-CoV-2 viral copies this assay can detect is 138 copies/mL. A negative result does not preclude SARS-Cov-2 infection and should not be used as the sole basis for treatment or other patient management decisions. A negative result may occur with  improper specimen collection/handling, submission of specimen other than nasopharyngeal swab, presence of viral mutation(s) within the areas targeted by this assay, and inadequate number of viral copies(<138 copies/mL). A negative result must be combined with clinical observations, patient history, and epidemiological information. The expected result is Negative.  Fact Sheet for Patients:  BloggerCourse.com  Fact Sheet for Healthcare Providers:  SeriousBroker.it  This test is no  t yet approved or cleared by the [place] and  has been authorized for detection and/or diagnosis of SARS-CoV-2 by FDA under an Emergency Use Authorization (EUA). This EUA will remain  in effect (meaning this test can be used) for the duration of the COVID-19 declaration under Section 564(b)(1) of the Act, 21 U.S.C.section 360bbb-3(b)(1), unless the authorization is terminated  or revoked sooner.       Influenza A by PCR NEGATIVE NEGATIVE   Influenza B by PCR NEGATIVE NEGATIVE     Comment: (NOTE) The Xpert Xpress SARS-CoV-2/FLU/RSV plus assay is intended as an aid in the diagnosis of influenza from Nasopharyngeal swab specimens and should not be used as a sole basis for treatment. Nasal washings and aspirates are unacceptable for Xpert Xpress SARS-CoV-2/FLU/RSV testing.  Fact Sheet for Patients: BloggerCourse.com  Fact Sheet for Healthcare Providers: SeriousBroker.it  This test is not yet approved or cleared by the [place] FDA and has been authorized for detection and/or diagnosis of SARS-CoV-2 by FDA under an Emergency Use Authorization (EUA). This EUA will remain in effect (meaning this test can be used) for the duration of the COVID-19 declaration under Section 564(b)(1) of the Act, 21 U.S.C. section 360bbb-3(b)(1), unless the authorization is terminated or revoked.  Performed at [facility], [address], [place], [place] [address]   Rapid urine drug screen (hospital performed)     Status: Abnormal   Collection Time: 03/13/21  5:56 AM  Result Value Ref Range   Opiates NONE DETECTED NONE DETECTED   Cocaine POSITIVE (A) NONE DETECTED   Benzodiazepines NONE DETECTED NONE DETECTED   Amphetamines NONE DETECTED NONE DETECTED   Tetrahydrocannabinol NONE DETECTED NONE DETECTED   Barbiturates NONE DETECTED NONE DETECTED    Comment: (NOTE) DRUG SCREEN FOR MEDICAL PURPOSES ONLY.  IF CONFIRMATION IS NEEDED FOR ANY PURPOSE, NOTIFY LAB WITHIN 5 DAYS.  LOWEST DETECTABLE LIMITS FOR URINE DRUG SCREEN Drug Class                     Cutoff (ng/mL) Amphetamine and metabolites    1000 Barbiturate and metabolites    200 Benzodiazepine                 200 Tricyclics and metabolites     300 Opiates and metabolites        300 Cocaine and metabolites        300 THC                            50 Performed at [facility], [address] [place], [place] [address]    Comprehensive metabolic panel     Status: Abnormal   Collection Time: 03/13/21  6:22 AM  Result Value Ref Range   Sodium 139 135 - 145 mmol/L   Potassium 4.9 3.5 - 5.1 mmol/L   Chloride 112 (H) 98 - 111 mmol/L   CO2 22 22 - 32 mmol/L   Glucose, Bld 118 (H) 70 - 99 mg/dL    Comment: Glucose reference range applies only to samples taken after fasting for at least 8 hours.   BUN 22 (H) 6 - 20 mg/dL   Creatinine, Ser 2.68 0.61 - 1.24 mg/dL   Calcium 8.9 8.9 - 34.1 mg/dL   Total Protein 6.7 6.5 - 8.1 g/dL   Albumin 3.7 3.5 - 5.0 g/dL   AST 33 15 - 41 U/L   ALT  17 0 - 44 U/L   Alkaline Phosphatase 183 (H) 38 - 126 U/L   Total Bilirubin 2.0 (H) 0.3 - 1.2 mg/dL   GFR, Estimated >44>60 >03>60 mL/min    Comment: (NOTE) Calculated using the CKD-EPI Creatinine Equation (2021)    Anion gap 5 5 - 15    Comment: Performed at [facility], [address] [place], [place] [address]  CBC with Differential     Status: Abnormal   Collection Time: 03/13/21  6:22 AM  Result Value Ref Range   WBC 5.6 4.0 - 10.5 K/uL   RBC 2.12 (L) 4.22 - 5.81 MIL/uL   Hemoglobin 6.5 (LL) 13.0 - 17.0 g/dL    Comment: This critical result has verified and been called to HUI PENG RN by Rozelle LoganMEGAN HAYES on 05 23 2022 at 0703, and has been read back. CRITICAL RESULT VERIFIED   HCT 19.0 (L) 39.0 - 52.0 %   MCV 89.6 80.0 - 100.0 fL   MCH 30.7 26.0 - 34.0 pg   MCHC 34.2 30.0 - 36.0 g/dL   RDW 95.622.8 (H) 38.711.5 - 56.415.5 %   Platelets 182 150 - 400 K/uL   nRBC 10.8 (H) 0.0 - 0.2 %   Neutrophils Relative % 73 %   Neutro Abs 4.1 1.7 - 7.7 K/uL   Lymphocytes Relative 19 %   Lymphs Abs 1.1 0.7 - 4.0 K/uL   Monocytes Relative 7 %   Monocytes Absolute 0.4 0.1 - 1.0 K/uL   Eosinophils Relative 0 %   Eosinophils Absolute 0.0 0.0 - 0.5 K/uL   Basophils Relative 0 %   Basophils Absolute 0.0 0.0 - 0.1 K/uL   Immature Granulocytes 1 %   Abs Immature Granulocytes 0.05 0.00 - 0.07 K/uL   Pappenheimer Bodies PRESENT     Carollee MassedHowell Jolly Bodies PRESENT    Polychromasia PRESENT    Sickle Cells PRESENT    Target Cells PRESENT    Giant PLTs PRESENT     Comment: Performed at [facility], [address] [place], [place] [address]  Troponin I (High Sensitivity)     Status: None   Collection Time: 03/13/21  6:28 AM  Result Value Ref Range   Troponin I (High Sensitivity) 6 <18 ng/L    Comment: (NOTE) Elevated high sensitivity troponin I (hsTnI) values and significant  changes across serial measurements may suggest ACS but many other  chronic and acute conditions are known to elevate hsTnI results.  Refer to the "Links" section for chest pain algorithms and additional  guidance. Performed at [facility], [address] [place], [place] [address]   Brain natriuretic peptide     Status: Abnormal   Collection Time: 03/13/21  6:28 AM  Result Value Ref Range   B Natriuretic Peptide 325.9 (H) 0.0 - 100.0 pg/mL    Comment: Performed at [facility], [address] [place], [place] [address]   DG Chest 2 View  Result Date: 03/13/2021 CLINICAL DATA:  Hypoxia. EXAM: CHEST - 2 VIEW COMPARISON:  Chest x-ray 02/08/2021 FINDINGS: Enlarged cardiac silhouette stable. The heart size and mediastinal contours are unchanged. Aortic calcifications. No focal consolidation. Increased interstitial markings with Kerley B lines. No pleural effusion. No pneumothorax. No acute osseous abnormality. IMPRESSION: Pulmonary edema. Electronically Signed   By: Tish FredericksonMorgane  Naveau M.D.   On: 03/13/2021 06:18   DG Lumbar Spine Complete  Result Date: 03/12/2021 CLINICAL DATA:  55 year old male with low back pain. EXAM:  LUMBAR SPINE - COMPLETE 4+ VIEW COMPARISON:  12/26/2020 radiographs FINDINGS: Five non rib-bearing lumbar type vertebra are identified in normal alignment. There is no evidence of fracture or subluxation. No focal bony lesions or spondylolysis noted. Disc spaces are maintained.  Heavy aortic atherosclerotic calcifications are present. IMPRESSION: Unremarkable lumbar spine. Aortic Atherosclerosis (ICD10-I70.0). Electronically Signed   By: Harmon Pier M.D.   On: 03/12/2021 13:35    Review of Systems  Constitutional: Negative.   HENT: Negative.   Eyes: Negative.   Respiratory: Positive for shortness of breath.   Cardiovascular: Negative.   Gastrointestinal: Negative.   Endocrine: Negative.   Genitourinary: Negative.   Musculoskeletal: Negative.   Skin: Negative.   Neurological: Negative.   Hematological: Negative.   Psychiatric/Behavioral: Negative.     Blood pressure 129/74, pulse 60, temperature 97.6 F (36.4 C), temperature source Oral, resp. rate 16, height 6\' 3"  (1.905 m), weight 76.7 kg, SpO2 95 %. Physical Exam Constitutional:      Appearance: Normal appearance.  HENT:     Head: Normocephalic and atraumatic.     Right Ear: Tympanic membrane normal.     Left Ear: Tympanic membrane normal.     Nose: Nose normal.     Mouth/Throat:     Mouth: Mucous membranes are moist.  Eyes:     Extraocular Movements: Extraocular movements intact.     Pupils: Pupils are equal, round, and reactive to light.  Cardiovascular:     Rate and Rhythm: Normal rate and regular rhythm.     Pulses: Normal pulses.     Heart sounds: Normal heart sounds.  Pulmonary:     Effort: Pulmonary effort is normal.     Breath sounds: Rhonchi and rales present.  Abdominal:     General: Bowel sounds are normal.     Palpations: Abdomen is soft.  Musculoskeletal:        General: Normal range of motion.     Cervical back: Normal range of motion and neck supple.  Skin:    General: Skin is warm and dry.  Neurological:     General: No focal deficit present.     Mental Status: He is alert and oriented to person, place, and time.  Psychiatric:        Thought Content: Thought content includes suicidal ideation.      Assessment/Plan A 55 year old gentleman with history of sickle cell  disease who is here with exertional dyspnea and possible acute CHF  #1 acute CHF: No prior history of CHF.  Patient appears to have significant anemia but at baseline.  With history of sickle cell disease and low hemoglobin with oxygen carrying capacity.  He normally runs between 6 and 7 g.  We may transfuse 1 unit of packed red blood cells.  Diuresis.  Get echocardiogram.  Patient also has history of cocaine abuse which may be contributing.  #2 suicidal ideation: Patient reported stress regarding his legal issues.  This has led to him being on the monitor.  He has been frustrated and became suicidal in the ER.  Currently on suicide precautions with a sitter.  Continue monitor  #3 tobacco abuse: Tobacco cessation counseling given.  #4 sickle cell anemia: No acute exacerbation.  Monitor H&H  #5 homelessness: Social worker consult  58, MD 03/13/2021, 8:45 AM

## 2021-03-13 NOTE — ED Provider Notes (Signed)
[place] DEPT Provider Note   CSN: [id number] Arrival date & time: 03/13/21  0035     History Chief Complaint  Patient presents with  . Suicidal    Scott Norton is a 55 y.o. male.  Scott Norton is a 55 y.o. male with a history of sickle cell anemia, depression, cocaine abuse, allergies, who presents via EMS for suicidal ideations.  Patient was seen in the ED yesterday for low back pain.  He has long history of chronic low back pain related to arthritis and he reports that he feels like no one ever truly helps him with his back pain.  This started to make him feel depressed.  He reports he was homeless and roaming the streets and started having feelings of suicide and considered jumping into traffic, decided to call 911.  He continues to endorse suicidal ideations, denies HI or AVH.  Reports continued chronic low back pain but denies other focal medical complaints.  Denies chest pain, shortness of breath or abdominal pain.  No fevers or recent illness.  Patient was treated in the ED for sickle cell pain on 5/21.  Of note patient noted to be hypoxic into the 80s on arrival, per nursing patient reported that his oxygen is always low, but that he does not wear oxygen.        Past Medical History:  Diagnosis Date  . AKI (acute kidney injury) ([place]) 02/08/2021  . Anemia   . Depression   . Heart murmur   . Seasonal allergies    wool & grass  . Sickle cell anemia [facility])     Patient Active Problem List   Diagnosis Date Noted  . Depression 03/01/2021  . Cocaine abuse with cocaine-induced mood disorder ([place]) 03/01/2021  . Sickle-cell crisis ([place]) 02/08/2021  . Hyperkalemia 02/08/2021  . Thrombocytopenia ([place]) 02/08/2021  . Leukocytosis 02/08/2021  . AKI (acute kidney injury) ([place]) 02/08/2021  . Cocaine abuse ([place]) 02/08/2021  . Homelessness 09/14/2020  . Suicidal ideations 09/04/2020  . Tobacco use 09/04/2020  . Sickle cell pain crisis ([place])  04/11/2015  . Vitamin D deficiency 01/31/2015  . Chronic pain 06/30/2014  . Respiratory infection 08/13/2012  . Healthcare maintenance 06/04/2012  . Cervical strain, acute 12/09/2011  . Neck sprain and strain 12/09/2011  . Sickle cell anemia ([place]) 11/29/2011  . Seasonal allergies 11/29/2011  . Seasonal allergic rhinitis 11/29/2011    Past Surgical History:  Procedure Laterality Date  . NO PAST SURGERIES    . ROOT CANAL         Family History  Problem Relation Age of Onset  . Alcohol abuse Mother   . Sickle cell trait Mother   . Hypertension Mother   . Alcohol abuse Father   . Sickle cell trait Father   . Early death Father        due to alcoholism  . Hypertension Father   . Hypertension Sister   . Hypertension Brother   . Hypertension Maternal Grandmother   . Hypertension Maternal Grandfather   . Hypertension Paternal Grandmother   . Hypertension Paternal Grandfather     Social History   Tobacco Use  . Smoking status: Current Every Day Smoker    Packs/day: 0.50    Types: Cigars  . Smokeless tobacco: Never Used  Vaping Use  . Vaping Use: Never used  Substance Use Topics  . Alcohol use: Yes    Comment: occasional but indicated etoh abuse on health hx questionaire  . Drug  use: Yes    Types: Marijuana, Cocaine    Home Medications Prior to Admission medications   Medication Sig Start Date End Date Taking? Authorizing Provider  Oxycodone HCl 10 MG TABS Take 10 mg by mouth 3 (three) times daily as needed (pain). 03/09/21  Yes [provider]  ondansetron (ZOFRAN ODT) 4 MG disintegrating tablet Dissolve 1 tablet (4 mg total) by mouth every 8 (eight) hours as needed for nausea or vomiting. 03/12/21   Isla Pence, MD    Allergies    Aspirin, Tape, and Tramadol  Review of Systems   Review of Systems  Constitutional: Negative for chills and fever.  HENT: Negative.   Respiratory: Negative for cough and shortness of breath.   Cardiovascular: Negative  for chest pain and leg swelling.  Gastrointestinal: Negative for abdominal pain, nausea and vomiting.  Genitourinary: Negative for dysuria and frequency.  Musculoskeletal: Positive for back pain. Negative for arthralgias and myalgias.  Skin: Negative for color change and rash.  Neurological: Negative for dizziness, syncope and light-headedness.  Psychiatric/Behavioral: Positive for dysphoric mood and suicidal ideas.    Physical Exam Updated Vital Signs BP 127/76   Pulse 62   Temp 97.6 F (36.4 C) (Oral)   Resp 20   Ht _0  (1.905 m)   Wt 76.7 kg   SpO2 90%   BMI 21.12 kg/m   Physical Exam Vitals and nursing note reviewed.  Constitutional:      General: He is not in acute distress.    Appearance: He is well-developed. He is not diaphoretic.  HENT:     Head: Normocephalic and atraumatic.  Eyes:     General:        Right eye: No discharge.        Left eye: No discharge.     Pupils: Pupils are equal, round, and reactive to light.  Cardiovascular:     Rate and Rhythm: Normal rate and regular rhythm.     Heart sounds: Normal heart sounds.  Pulmonary:     Effort: Pulmonary effort is normal. No respiratory distress.     Breath sounds: Rales present. No wheezing.     Comments: On room air patient noted to be hypoxic into the 80s, this improves when patient sits up, but when he lays down he quickly becomes hypoxic again, no increased work of breathing, and patient able to speak in full sentences, on exam he does have some faint crackles in bilateral bases Abdominal:     General: Bowel sounds are normal. There is no distension.     Palpations: Abdomen is soft. There is no mass.     Tenderness: There is no abdominal tenderness. There is no guarding.     Comments: Abdomen soft, nondistended, nontender to palpation in all quadrants without guarding or peritoneal signs   Musculoskeletal:        General: No deformity.     Cervical back: Neck supple.     Right lower leg: No edema.      Left lower leg: No edema.     Comments: No lower extremity edema noted  Skin:    General: Skin is warm and dry.     Capillary Refill: Capillary refill takes less than 2 seconds.  Neurological:     Mental Status: He is alert.     Coordination: Coordination normal.     Comments: Speech is clear, able to follow commands Moves extremities without ataxia, coordination intact  Psychiatric:  Attention and Perception: Attention normal. He does not perceive auditory or visual hallucinations.        Mood and Affect: Mood is depressed.        Speech: Speech normal.        Behavior: Behavior normal. Behavior is cooperative.        Thought Content: Thought content includes suicidal ideation. Thought content does not include homicidal ideation. Thought content includes suicidal plan.     ED Results / Procedures / Treatments   Labs (all labs ordered are listed, but only abnormal results are displayed) Labs Reviewed  COMPREHENSIVE METABOLIC PANEL - Abnormal; Notable for the following components:      Result Value   Chloride 112 (*)    Glucose, Bld 118 (*)    BUN 22 (*)    Alkaline Phosphatase 183 (*)    Total Bilirubin 2.0 (*)    All other components within normal limits  CBC WITH DIFFERENTIAL/PLATELET - Abnormal; Notable for the following components:   RBC 2.12 (*)    Hemoglobin 6.5 (*)    HCT 19.0 (*)    RDW 22.8 (*)    nRBC 10.8 (*)    All other components within normal limits  RAPID URINE DRUG SCREEN, HOSP PERFORMED - Abnormal; Notable for the following components:   Cocaine POSITIVE (*)    All other components within normal limits  BRAIN NATRIURETIC PEPTIDE - Abnormal; Notable for the following components:   B Natriuretic Peptide 325.9 (*)    All other components within normal limits  RESP PANEL BY RT-PCR (FLU A&B, COVID) ARPGX2  RETICULOCYTES  TROPONIN I (HIGH SENSITIVITY)    EKG None  Radiology DG Chest 2 View  Result Date: 03/13/2021 CLINICAL DATA:  Hypoxia.  EXAM: CHEST - 2 VIEW COMPARISON:  Chest x-ray 02/08/2021 FINDINGS: Enlarged cardiac silhouette stable. The heart size and mediastinal contours are unchanged. Aortic calcifications. No focal consolidation. Increased interstitial markings with Kerley B lines. No pleural effusion. No pneumothorax. No acute osseous abnormality. IMPRESSION: Pulmonary edema. Electronically Signed   By: Iven Finn M.D.   On: 03/13/2021 06:18   DG Lumbar Spine Complete  Result Date: 03/12/2021 CLINICAL DATA:  55 year old male with low back pain. EXAM: LUMBAR SPINE - COMPLETE 4+ VIEW COMPARISON:  12/26/2020 radiographs FINDINGS: Five non rib-bearing lumbar type vertebra are identified in normal alignment. There is no evidence of fracture or subluxation. No focal bony lesions or spondylolysis noted. Disc spaces are maintained. Heavy aortic atherosclerotic calcifications are present. IMPRESSION: Unremarkable lumbar spine. Aortic Atherosclerosis (ICD10-I70.0). Electronically Signed   By: Margarette Canada M.D.   On: 03/12/2021 13:35    Procedures Procedures   Medications Ordered in ED Medications - No data to display  ED Course  I have reviewed the triage vital signs and the nursing notes.  Pertinent labs & imaging results that were available during my care of the patient were reviewed by me and considered in my medical decision making (see chart for details).    MDM Rules/Calculators/A&P                         55 year old male presents with complaints of suicidal ideations with plans to walk into traffic.  On arrival patient is noted to be hypoxic into the 80s on room air, this seems to be exacerbated when he is laying down or sleeping, but even with ambulation patient desatted to 87%.  O2 sat stable on 2 L nasal cannula.  Seen  in the ED yesterday for back pain.  Denies chest pain or shortness of breath, does not wear oxygen at home.  Given persistent hypoxia will need medical work-up for new oxygen requirement, will also  place TTS consult regarding suicidal ideation.  Differential includes: Chronic lung disease, CHF, PE, pneumonia, acute chest syndrome  I have independently ordered, reviewed and interpreted all labs and imaging:  EKG without concerning ischemic changes, sinus rhythm  Chest x-ray with cardiomegaly and pulmonary edema, patient denies any known history of CHF, this would certainly explain the faint crackles noted on patient's exam.  Concern for new onset heart failure as cause for hypoxia.  Could be in the setting of high output cardiac state due to chronic anemia from sickle cell.  CBC: No leukocytosis, but hemoglobin noted at 6.5, lower than patient's baseline, typically 7.5-8.5.  Normal platelets CMP: No significant electrolyte derangements requiring intervention, normal renal function, T bili of 2.0 and alk phos of 183 but LFTs otherwise unremarkable BNP: Mildly elevated at 325.9 Troponin: Negative COVID: Negative  Given new onset CHF with acute hypoxic respiratory failure will require admission.  Initially consulted medicine team and discussed with Dr. Neysa Bonito, but concerned that sickle cell could be contributing to this, requests sickle cell team be consulted for admission.  Case discussed with Dr. Jonelle Sidle with sickle cell service who will see and admit the patient.    Final Clinical Impression(s) / ED Diagnoses Final diagnoses:  Acute hypoxemic respiratory failure ([place])  Acute congestive heart failure, unspecified heart failure type [facility])  Suicidal ideation    Rx / DC Orders ED Discharge Orders    None       Janet Berlin 03/13/21 0858    Shanon Rosser, MD 03/13/21 2231

## 2021-03-13 NOTE — Discharge Instructions (Addendum)
Follow up with your doctor for regular pain management of sickle cell disease and for musculoskeletal back pain that has become an issue for you.

## 2021-03-13 NOTE — ED Notes (Signed)
Patient is asleep.  

## 2021-03-13 NOTE — ED Notes (Signed)
Patient agreed to where nasal cannula. I advise that while he was sleeping, it would be best.

## 2021-03-13 NOTE — Progress Notes (Signed)
  Echocardiogram 2D Echocardiogram has been performed.  Scott Norton 03/13/2021, 3:44 PM

## 2021-03-13 NOTE — ED Notes (Signed)
Patient ambulated to the bathroom.

## 2021-03-13 NOTE — ED Triage Notes (Signed)
Patient was picked up from an apartment building by EMS. Patient reports being suicidal and having back pain.

## 2021-03-13 NOTE — ED Notes (Signed)
Patient ambulated in the room. O2 saturation began at 90% and decreased to 87% while ambulating. Once back in the bed, and sitting up, patient O2 saturation remained at 90-91% on room air.

## 2021-03-13 NOTE — ED Notes (Signed)
Pt has two bags one pt's bag and one traveling bag, both are under the table at nursing station 9-25.

## 2021-03-13 NOTE — Progress Notes (Signed)
The patient's belongings (A blue carrier on wheels and a white patient belonging bag)  are in the [facility] Cabinet on [address] (next to the Telemetry monitoring).

## 2021-03-13 NOTE — BH Assessment (Addendum)
Comprehensive Clinical Assessment (CCA) Note  03/13/2021 Scott Norton [id number]   DISPOSITION: Chana Bode NP recommends patient be observed and monitored.    The patient demonstrates the following risk factors for suicide: Chronic risk factors for suicide include: N/A. Acute risk factors for suicide include: N/A. Protective factors for this patient include: coping skills. Considering these factors, the overall suicide risk at this point appears to be low. Patient is appropriate for outpatient follow up.  Patient is a 55 year old male that presents this date with passive S/I. Patient denies any H/I or AVH. Patient on arrival reported S/I with a plan to walk into traffic although denies at the time of assessment. Patient has recently as of this date found out that he has medical issues per lab work and is now concerned about his heart. Patient has cardiomyopathy and is stating he wants to live and address his medical concerns. Patient per nurse may be medically admitted with status pending. Patient has a history of depression and ongoing SA issues stating he uses cocaine in various amounts two to three times a week although he reports he has not used any substances in the last five days although his UDS is positive this date for cocaine. Patient was recently discharged on 5/22 after he was inpatient on 5/10. Patient on that date met criteria and was admitted to [facility].  Patient reports he continues to be homeless and lacks support in the community. Patient did not follow up with any OP services on discharge stating he did not have transportation. Patient did report he is in the process of getting a case manager at [facility] to assist with housing issues. Patient renders limited history this date. Additional information noted below.   Per assessment of 02/28/21 Scott Norton TTS counselor writes: Scott Norton is a 55 year old male who presents voluntary and unaccompanied to [facility]. Clinician asked the pt, "what brought you to the  hospital?" Pt reported, he was having body aches. Per pt, he hospitalized for two weeks due to low hemoglobin. Pt reported, his parole officer is a major stressor; she tries to send a warrant for his arrest. Pt reported, when he couldn't make their appointment because he had a follow up doctor's appointment (for when he was hospitalized). Pt reported,he got out of jail on February 21 he got a letter to follow up on February 28 but his parole officer told him he needed to be in court on February 24. Pt reported, his parole officer barks on people but doesn't help. Pt reported, he was with a friend who made a drug deal to an undercover cop, but his friend gave him the drugs to give to the cop and now has nine charges. Pt reported, he called a friend to bring him his gun (20m). Pt reported, he feels nothing has changed. Pt reported, he had thought about jumping off a bridge or using his gun. Pt denies, HI, AVH, self-injurious behaviors.   Pt reported, he crushes the cocaine and puts it in a cigar, a couple days ago. Pt reported, he used cocaine everyday last week. Pt reported, drinking last week. Pt's UDS is positive for cocaine. Pt denies, being linked to OPT resources (medication management and/or counseling.) Pt has previous inpatient admissions.   Patient met inpatient criteria at that time. Pt presents quiet, awake in scrubs with normal speech. Pt's mood, affect was depressed. Pt's thought content was appropriate to mood. Pt's insight was fair. Pt's judgement was poor. Pt reported, if discharge  he will most likely hurt himself. Clinician asked the pt would he jump off a bridge or use his gun, pt responded, "don't know at moment."  Pt presents quiet, awake in street clothes with normal speech. Pt's mood, affect was depressed. Pt's thought content was appropriate to mood. Pt's insight was fair. Pt's judgement was poor. Pt did not appear to be responding to internal stimuli.  Chief Complaint:  Chief  Complaint  Patient presents with  . Suicidal   Visit Diagnosis: MDD recurrent without psychotic features, severe     CCA Screening, Triage and Referral (STR)  Patient Reported Information How did you hear about [place]? Self  Referral name: No data recorded Referral phone number: No data recorded  Whom do you see for routine medical problems? I don't have a doctor  Practice[facility]Facility Name: [facility]  Practice/Facility Phone Number: No data recorded Name of Contact: No data recorded Contact Number: No data recorded Contact Fax Number: No data recorded Prescriber Name: No data recorded Prescriber Address (if known): No data recorded  What Is the Reason for Your Visit/Call Today? Ongoing thoughts of self harm earlier this date although denies at the time of assessment  How Long Has This Been Causing You Problems? 1 wk - 1 month  What Do You Feel Would Help You the Most Today? Housing Assistance   Have You Recently Been in Any Inpatient Treatment (Hospital/Detox/Crisis Center/28-Day Program)? No  Name/Location of Program/Hospital:[facility]  How Long Were You There? 9 days  When Were You Discharged? No data recorded  Have You Ever Received Services From [facility] Before? Yes  Who Do You See at [facility]? pt has been seen before presenting with similar symptoms   Have You Recently Had Any Thoughts About Hurting Yourself? No  Are You Planning to Commit Suicide/Harm Yourself At This time? No   Have you Recently Had Thoughts About [place]? No  Explanation: No data recorded  Have You Used Any Alcohol or Drugs in the Past 24 Hours? No  How Long Ago Did You Use Drugs or Alcohol? No data recorded What Did You Use and How Much? 1 pint alcohol   Do You Currently Have a Therapist/Psychiatrist? No  Name of Therapist/Psychiatrist: No data recorded  Have You Been Recently Discharged From Any Office Practice or Programs? No  Explanation of Discharge From  Practice/Program: No data recorded    CCA Screening Triage Referral Assessment Type of Contact: Face-to-Face  Is this Initial or Reassessment? No data recorded Date Telepsych consult ordered in [facility]:  09/28/2020  Time Telepsych consult ordered in [facility]:  [place]   Patient Reported Information Reviewed? Yes  Patient Left Without Being Seen? No data recorded Reason for Not Completing Assessment: No data recorded  Collateral Involvement: none   Does Patient Have a [place]? No data recorded Name and Contact of Legal Guardian: Self.   If Minor and Not Living with Parent(s), Who has Custody? No data recorded Is CPS involved or ever been involved? Never  Is APS involved or ever been involved? Never   Patient Determined To Be At Risk for Harm To Self or Others Based on Review of Patient Reported Information or Presenting Complaint? No  Method: No data recorded Availability of Means: No data recorded Intent: No data recorded Notification Required: No data recorded Additional Information for Danger to Others Potential: No data recorded Additional Comments for Danger to Others Potential: No data recorded Are There Guns or Other Weapons in  Your Home? No data recorded Types of Guns/Weapons: No data recorded Are These Weapons Safely Secured?                            No data recorded Who Could Verify You Are Able To Have These Secured: No data recorded Do You Have any Outstanding Charges, Pending Court Dates, Parole/Probation? No data recorded Contacted To Inform of Risk of Harm To Self or Others: Other: Comment (NA)   Location of Assessment: [facility] ED   Does Patient Present under Involuntary Commitment? No  IVC Papers Initial File Date: No data recorded  [place] of Residence: [place]   Patient Currently Receiving the Following Services: Not Receiving Services   Determination of Need: Urgent (48 hours)   Options For Referral: Outpatient Therapy     CCA  Biopsychosocial Intake/Chief Complaint:  pt had thoughts of self harm earlier this date  Current Symptoms/Problems: Ongoing depresssion and anxiety   Patient Reported Schizophrenia/Schizoaffective Diagnosis in Past: No   Strengths: Not assessed.  Preferences: Not assessed.  Abilities: Not assessed.   Type of Services Patient Feels are Needed: Pt reported, if discharged he can't contract for safety if discharge.   Initial Clinical Notes/Concerns: NA   Mental Health Symptoms Depression:  Hopelessness; Irritability; Sleep (too much or little); Worthlessness; Fatigue   Duration of Depressive symptoms: Greater than two weeks   Mania:  None   Anxiety:   Worrying   Psychosis:  None   Duration of Psychotic symptoms: No data recorded  Trauma:  None   Obsessions:  None   Compulsions:  None   Inattention:  None   Hyperactivity/Impulsivity:  N/A   Oppositional/Defiant Behaviors:  Argumentative   Emotional Irregularity:  N/A   Other Mood/Personality Symptoms:  No data recorded   Mental Status Exam Appearance and self-care  Stature:  Average   Weight:  Average weight   Clothing:  -- (Pt in scrubs.)   Grooming:  Neglected   Cosmetic use:  None   Posture/gait:  Normal   Motor activity:  Not Remarkable   Sensorium  Attention:  Normal   Concentration:  Normal   Orientation:  X5   Recall/memory:  Normal   Affect and Mood  Affect:  Depressed   Mood:  Depressed   Relating  Eye contact:  Normal   Facial expression:  Responsive   Attitude toward examiner:  Cooperative   Thought and Language  Speech flow: Normal   Thought content:  Appropriate to Mood and Circumstances   Preoccupation:  None   Hallucinations:  None   Organization:  No data recorded  [employer] of Knowledge:  Fair   Intelligence:  Average   Abstraction:  Normal   Judgement:  Poor   Reality Testing:  Realistic   Insight:  Fair   Decision Making:   Normal   Social Functioning  Social Maturity:  Responsible   Social Judgement:  Normal   Stress  Stressors:  Housing; Investment banker, corporate Ability:  Exhausted   Skill Deficits:  Activities of daily living   Supports:  Support needed     Religion: Religion/Spirituality Are You A Religious Person?: No  Leisure/Recreation: Leisure / Recreation Do You Have Hobbies?: Yes Leisure and Hobbies: Watching Tv and playing video games  Exercise/Diet: Exercise/Diet Do You Exercise?: No Have You Gained or Lost A Significant Amount of Weight in the Past Six Months?: No Do You Follow a Special Diet?:  No Do You Have Any Trouble Sleeping?: Yes Explanation of Sleeping Difficulties: Pt reported, his sleep was up and down.   CCA Employment/Education Employment/Work Situation: Employment / Work Situation Employment situation: On disability Why is patient on disability: Sickle Cell How long has patient been on disability: Since 1987 Patient's job has been impacted by current illness: No What is the longest time patient has a held a job?: 2 years Where was the patient employed at that time?: [employer] Has patient ever been in the [employer]?: No  Education: Education Last Grade Completed: 12 Name of [place]: UTA Did Teacher, adult education From [employer]?: Yes (Pt got his GED.) Did [place]?: No Did [place]?: No Did You Have An Individualized Education Program (IIEP): No Did You Have Any Difficulty At School?: No   CCA Family/Childhood History Family and Relationship History: Family history Marital status: Single Are you sexually active?: No What is your sexual orientation?: Heterosexual Has your sexual activity been affected by drugs, alcohol, medication, or emotional stress?: No Does patient have children?: Yes How many children?: 3 How is patient's relationship with their children?: Pt reports oldest daughter lives in [place] and 2 other children are in  foster care  Childhood History:  Childhood History By whom was/is the patient raised?: Adoptive parents Additional childhood history information: Pt reports his father passed 22 years ago and aunt adopted Pt at the age of 43 (Pt refers to aunt as mother) Description of patient's relationship with caregiver when they were a child: "It was great with my aunt" Patient's description of current relationship with people who raised him/her: "My aunt passed in 40 and I dont talk to my biological mother much" How were you disciplined when you got in trouble as a child/adolescent?: Spankings Does patient have siblings?: Yes Description of patient's current relationship with siblings: "We dont have much contact" Did patient suffer any verbal/emotional/physical/sexual abuse as a child?: No Did patient suffer from severe childhood neglect?: No Has patient ever been sexually abused/assaulted/raped as an adolescent or adult?: No Was the patient ever a victim of a crime or a disaster?: No Witnessed domestic violence?: No Has patient been affected by domestic violence as an adult?: No  Child/Adolescent Assessment:     CCA Substance Use Alcohol/Drug Use: Alcohol / Drug Use Pain Medications: See MAR Prescriptions: See MAR Over the Counter: See MAR History of alcohol / drug use?: Yes Negative Consequences of Use: Financial,Legal Substance #1 Name of Substance 1: cocaine 1 - Age of First Use: UTA 1 - Amount (size/oz): varies 1 - Frequency: varies 1 - Duration: Ongoing. 1 - Last Use / Amount: Per pt, "not much." 1 - Method of Aquiring: UTA Substance #2 Name of Substance 2: THC 2 - Age of First Use: UTA 2 - Amount (size/oz): varies 2 - Frequency: varies 2 - Duration: Ongoing. 2 - Last Use / Amount: Pt reported, last week. 2 - Method of Aquiring: UTA 2 - Route of Substance Use: Oral.                     ASAM's:  Six Dimensions of Multidimensional Assessment  Dimension 1:  Acute  Intoxication and/or Withdrawal Potential:      Dimension 2:  Biomedical Conditions and Complications:   Dimension 2:  Description of patient's biomedical conditions and  complications: Pt has Sickle Cell Anemia.  Dimension 3:  Emotional, Behavioral, or Cognitive Conditions and Complications:  Dimension 3:  Description of emotional,  behavioral, or cognitive conditions and complications: Major Depressive Disorder.  Dimension 4:  Readiness to Change:     Dimension 5:  Relapse, Continued use, or Continued Problem Potential:  Dimension 5:  Relapse, continued use, or continued problem potential critiera description: Pt is homeless and lives in hotels.  Dimension 6:  Recovery/Living Environment:  Dimension 6:  Recovery/Iiving environment criteria description: Pt denies, supports and is currently homeless.  ASAM Severity Score:    ASAM Recommended Level of Treatment: ASAM Recommended Level of Treatment: Level II Intensive Outpatient Treatment   Substance use Disorder (SUD) Substance Use Disorder (SUD)  Checklist Symptoms of Substance Use: Continued use despite having a persistent/recurrent physical/psychological problem caused/exacerbated by use  Recommendations for Services/Supports/Treatments: Recommendations for Services/Supports/Treatments Recommendations For Services/Supports/Treatments: Inpatient Hospitalization  DSM5 Diagnoses: Patient Active Problem List   Diagnosis Date Noted  . Acute exacerbation of CHF (congestive heart failure) ([place]) 03/13/2021  . Depression 03/01/2021  . Cocaine abuse with cocaine-induced mood disorder ([place]) 03/01/2021  . Sickle-cell crisis ([place]) 02/08/2021  . Hyperkalemia 02/08/2021  . Thrombocytopenia ([place]) 02/08/2021  . Leukocytosis 02/08/2021  . AKI (acute kidney injury) ([place]) 02/08/2021  . Cocaine abuse ([place]) 02/08/2021  . Homelessness 09/14/2020  . Suicidal ideations 09/04/2020  . Tobacco use 09/04/2020  . Sickle cell pain crisis ([place]) 04/11/2015  .  Vitamin D deficiency 01/31/2015  . Chronic pain 06/30/2014  . Respiratory infection 08/13/2012  . Healthcare maintenance 06/04/2012  . Cervical strain, acute 12/09/2011  . Neck sprain and strain 12/09/2011  . Sickle cell anemia ([place]) 11/29/2011  . Seasonal allergies 11/29/2011  . Seasonal allergic rhinitis 11/29/2011    Patient Centered Plan: Patient is on the following Treatment Plan(s):  Referrals to Alternative Service(s): Referred to Alternative Service(s):   Place:   Date:   Time:    Referred to Alternative Service(s):   Place:   Date:   Time:    Referred to Alternative Service(s):   Place:   Date:   Time:    Referred to Alternative Service(s):   Place:   Date:   Time:     Mamie Nick, LCAS

## 2021-03-13 NOTE — ED Notes (Signed)
Patient is A&Ox4, pleasant and cooperative

## 2021-03-13 NOTE — ED Notes (Signed)
Patient states once he left [facility] he felt depressed. Patient has thoughts of harming hisself, says he would "walk in front of some traffic or off a bridge".  Patient has not acted on these plans, he states that's why he called 911. Patient has been admitted to [facility] previously. No HI thoughts.

## 2021-03-14 DIAGNOSIS — F1410 Cocaine abuse, uncomplicated: Secondary | ICD-10-CM | POA: Diagnosis not present

## 2021-03-14 DIAGNOSIS — R45851 Suicidal ideations: Secondary | ICD-10-CM | POA: Diagnosis not present

## 2021-03-14 DIAGNOSIS — I509 Heart failure, unspecified: Secondary | ICD-10-CM | POA: Diagnosis not present

## 2021-03-14 DIAGNOSIS — D571 Sickle-cell disease without crisis: Secondary | ICD-10-CM | POA: Diagnosis not present

## 2021-03-14 LAB — CBC WITH DIFFERENTIAL/PLATELET
Abs Immature Granulocytes: 0.02 10*3/uL (ref 0.00–0.07)
Basophils Absolute: 0 10*3/uL (ref 0.0–0.1)
Basophils Relative: 0 %
Eosinophils Absolute: 0.1 10*3/uL (ref 0.0–0.5)
Eosinophils Relative: 1 %
HCT: 19.2 % — ABNORMAL LOW (ref 39.0–52.0)
Hemoglobin: 6.6 g/dL — CL (ref 13.0–17.0)
Immature Granulocytes: 0 %
Lymphocytes Relative: 41 %
Lymphs Abs: 2.9 10*3/uL (ref 0.7–4.0)
MCH: 31.1 pg (ref 26.0–34.0)
MCHC: 34.4 g/dL (ref 30.0–36.0)
MCV: 90.6 fL (ref 80.0–100.0)
Monocytes Absolute: 1.4 10*3/uL — ABNORMAL HIGH (ref 0.1–1.0)
Monocytes Relative: 19 %
Neutro Abs: 2.8 10*3/uL (ref 1.7–7.7)
Neutrophils Relative %: 39 %
Platelets: 186 10*3/uL (ref 150–400)
RBC: 2.12 MIL/uL — ABNORMAL LOW (ref 4.22–5.81)
RDW: 22.7 % — ABNORMAL HIGH (ref 11.5–15.5)
WBC: 7.1 10*3/uL (ref 4.0–10.5)
nRBC: 9.3 % — ABNORMAL HIGH (ref 0.0–0.2)

## 2021-03-14 LAB — COMPREHENSIVE METABOLIC PANEL
ALT: 20 U/L (ref 0–44)
AST: 36 U/L (ref 15–41)
Albumin: 3.6 g/dL (ref 3.5–5.0)
Alkaline Phosphatase: 182 U/L — ABNORMAL HIGH (ref 38–126)
Anion gap: 3 — ABNORMAL LOW (ref 5–15)
BUN: 19 mg/dL (ref 6–20)
CO2: 25 mmol/L (ref 22–32)
Calcium: 9.1 mg/dL (ref 8.9–10.3)
Chloride: 109 mmol/L (ref 98–111)
Creatinine, Ser: 0.99 mg/dL (ref 0.61–1.24)
GFR, Estimated: >60 mL/min (ref >60)
Glucose, Bld: 95 mg/dL (ref 70–99)
Potassium: 4.4 mmol/L (ref 3.5–5.1)
Sodium: 137 mmol/L (ref 135–145)
Total Bilirubin: 2.1 mg/dL — ABNORMAL HIGH (ref 0.3–1.2)
Total Protein: 6.7 g/dL (ref 6.5–8.1)

## 2021-03-14 LAB — PREPARE RBC (CROSSMATCH)

## 2021-03-14 MED ORDER — NICOTINE 21 MG/24HR TD PT24
21.0000 mg | MEDICATED_PATCH | Freq: Every day | TRANSDERMAL | Status: DC
  Administered 2021-03-14: 21 mg via TRANSDERMAL
  Filled 2021-03-14 (×2): qty 1

## 2021-03-14 MED ORDER — SODIUM CHLORIDE 0.9% IV SOLUTION: Freq: Once | INTRAVENOUS | Status: AC

## 2021-03-14 NOTE — Consult Note (Signed)
[facility] Face-to-Face Psychiatry Consult   Reason for Consult:  Suicidal ideation Referring Physician:  Dr. Mikeal Hawthorne Patient Identification: Scott Norton MRN:  [id number] Principal Diagnosis: Acute exacerbation of CHF (congestive heart failure) (HCC) Diagnosis:  Principal Problem:   Acute exacerbation of CHF (congestive heart failure) (HCC) Active Problems:   Sickle cell anemia (HCC)   Suicidal ideations   Tobacco use   Homelessness   Cocaine abuse (HCC)  Total Time spent with patient: 30 minutes  Subjective:   Scott Norton is a 56 y.o. male patient admitted with suicidal ideations.  He states he recently experienced office to his current state of depression.  States during this evaluation he was recently diagnosed with congestive heart failure, which is more concerning.  He is able to verbalize his ongoing motivation for health and wellness, and has no suicidal intentions.  Patient states his primary stressors are homelessness, probation, and ongoing medical comorbidities.  He denies any recent suicide attempt.  He denies any current suicidal ideation, homicidal ideation, or auditory or visual hallucination.   He reports that he does not have any active suicidal ideations, and no intentions to complete self-harm.  Patient reports that he has had some general suicidal thoughts over the past month due to his current homeless situation and feeling hopeless. He states that he was recently diagnosed with congestive heart failure, he wants to live.   HPI:  Patient is a 55 year old homeless guy with sickle cell disease who was admitted with sickle cell painful crisis a few weeks ago.  When he left he had significant change in his lifestyle.  Patient reported family dynamics and then problem with his probation officers.  This has led him to being suicidal.  At this point he came in with shortness of breath and hypoxia.  Patient appears to have new oxygen demand at 2 L/min.  Findings of's CHF.  Also hemoglobin  dropping to 6.57.6 range.  Past Psychiatric History: Depression. Currently not receiving any psychiatric services at this time. Denies any previous suicidal ideations or attempts.   Risk to Self:  Yes Risk to Others:  Denies Prior Inpatient Therapy:  Denies Prior Outpatient Therapy:  Denies  Past Medical History:  Past Medical History:  Diagnosis Date  . AKI (acute kidney injury) (HCC) 02/08/2021  . Anemia   . Depression   . Heart murmur   . Seasonal allergies    wool & grass  . Sickle cell anemia (HCC)     Past Surgical History:  Procedure Laterality Date  . NO PAST SURGERIES    . ROOT CANAL     Family History:  Family History  Problem Relation Age of Onset  . Alcohol abuse Mother   . Sickle cell trait Mother   . Hypertension Mother   . Alcohol abuse Father   . Sickle cell trait Father   . Early death Father        due to alcoholism  . Hypertension Father   . Hypertension Sister   . Hypertension Brother   . Hypertension Maternal Grandmother   . Hypertension Maternal Grandfather   . Hypertension Paternal Grandmother   . Hypertension Paternal Grandfather    Family Psychiatric  History: Noncontributory, was "given away by his mother."  Social History:  Social History   Substance and Sexual Activity  Alcohol Use Yes   Comment: occasional but indicated etoh abuse on health hx questionaire     Social History   Substance and Sexual Activity  Drug Use Yes  .  Types: Marijuana, Cocaine    Social History   Socioeconomic History  . Marital status: Single    Spouse name: Not on file  . Number of children: Not on file  . Years of education: Not on file  . Highest education level: Not on file  Occupational History  . Not on file  Tobacco Use  . Smoking status: Current Every Day Smoker    Packs/day: 0.50    Types: Cigars  . Smokeless tobacco: Never Used  Vaping Use  . Vaping Use: Never used  Substance and Sexual Activity  . Alcohol use: Yes    Comment:  occasional but indicated etoh abuse on health hx questionaire  . Drug use: Yes    Types: Marijuana, Cocaine  . Sexual activity: Yes  Other Topics Concern  . Not on file  Social History Narrative   Lives with Friends in [place]. Working on housing for himself   On disability for his SS disease      No children, no stable relationship   Social Determinants of Corporate investment banker Strain: Not on file  Food Insecurity: Not on file  Transportation Needs: Not on file  Physical Activity: Not on file  Stress: Not on file  Social Connections: Not on file   Additional Social History:    Allergies:   Allergies  Allergen Reactions  . Aspirin Other (See Comments)    Increased heart rate  . Tape Other (See Comments)    rash  . Tramadol Hives and Itching    Labs:  Results for orders placed or performed during the hospital encounter of 03/13/21 (from the past 48 hour(s))  Resp Panel by RT-PCR (Flu A&B, Covid) Nasopharyngeal Swab     Status: None   Collection Time: 03/13/21  5:18 AM   Specimen: Nasopharyngeal Swab; Nasopharyngeal(NP) swabs in vial transport medium  Result Value Ref Range   SARS Coronavirus 2 by RT PCR NEGATIVE NEGATIVE    Comment: (NOTE) SARS-CoV-2 target nucleic acids are NOT DETECTED.  The SARS-CoV-2 RNA is generally detectable in upper respiratory specimens during the acute phase of infection. The lowest concentration of SARS-CoV-2 viral copies this assay can detect is 138 copies/mL. A negative result does not preclude SARS-Cov-2 infection and should not be used as the sole basis for treatment or other patient management decisions. A negative result may occur with  improper specimen collection/handling, submission of specimen other than nasopharyngeal swab, presence of viral mutation(s) within the areas targeted by this assay, and inadequate number of viral copies(<138 copies/mL). A negative result must be combined with clinical observations, patient  history, and epidemiological information. The expected result is Negative.  Fact Sheet for Patients:  BloggerCourse.com  Fact Sheet for Healthcare Providers:  SeriousBroker.it  This test is no t yet approved or cleared by the [place] FDA and  has been authorized for detection and/or diagnosis of SARS-CoV-2 by FDA under an Emergency Use Authorization (EUA). This EUA will remain  in effect (meaning this test can be used) for the duration of the COVID-19 declaration under Section 564(b)(1) of the Act, 21 U.S.C.section 360bbb-3(b)(1), unless the authorization is terminated  or revoked sooner.       Influenza A by PCR NEGATIVE NEGATIVE   Influenza B by PCR NEGATIVE NEGATIVE    Comment: (NOTE) The Xpert Xpress SARS-CoV-2/FLU/RSV plus assay is intended as an aid in the diagnosis of influenza from Nasopharyngeal swab specimens and should not be used as a sole basis for treatment. Nasal  washings and aspirates are unacceptable for Xpert Xpress SARS-CoV-2/FLU/RSV testing.  Fact Sheet for Patients: BloggerCourse.com  Fact Sheet for Healthcare Providers: SeriousBroker.it  This test is not yet approved or cleared by the [place] FDA and has been authorized for detection and/or diagnosis of SARS-CoV-2 by FDA under an Emergency Use Authorization (EUA). This EUA will remain in effect (meaning this test can be used) for the duration of the COVID-19 declaration under Section 564(b)(1) of the Act, 21 U.S.C. section 360bbb-3(b)(1), unless the authorization is terminated or revoked.  Performed at [facility], [address], [place], [place] [address]   Rapid urine drug screen (hospital performed)     Status: Abnormal   Collection Time: 03/13/21  5:56 AM  Result Value Ref Range   Opiates NONE DETECTED NONE DETECTED   Cocaine POSITIVE (A) NONE DETECTED    Benzodiazepines NONE DETECTED NONE DETECTED   Amphetamines NONE DETECTED NONE DETECTED   Tetrahydrocannabinol NONE DETECTED NONE DETECTED   Barbiturates NONE DETECTED NONE DETECTED    Comment: (NOTE) DRUG SCREEN FOR MEDICAL PURPOSES ONLY.  IF CONFIRMATION IS NEEDED FOR ANY PURPOSE, NOTIFY LAB WITHIN 5 DAYS.  LOWEST DETECTABLE LIMITS FOR URINE DRUG SCREEN Drug Class                     Cutoff (ng/mL) Amphetamine and metabolites    1000 Barbiturate and metabolites    200 Benzodiazepine                 200 Tricyclics and metabolites     300 Opiates and metabolites        300 Cocaine and metabolites        300 THC                            50 Performed at [facility], [address] [place], [place] [address]   Comprehensive metabolic panel     Status: Abnormal   Collection Time: 03/13/21  6:22 AM  Result Value Ref Range   Sodium 139 135 - 145 mmol/L   Potassium 4.9 3.5 - 5.1 mmol/L   Chloride 112 (H) 98 - 111 mmol/L   CO2 22 22 - 32 mmol/L   Glucose, Bld 118 (H) 70 - 99 mg/dL    Comment: Glucose reference range applies only to samples taken after fasting for at least 8 hours.   BUN 22 (H) 6 - 20 mg/dL   Creatinine, Ser 8.29 0.61 - 1.24 mg/dL   Calcium 8.9 8.9 - 56.2 mg/dL   Total Protein 6.7 6.5 - 8.1 g/dL   Albumin 3.7 3.5 - 5.0 g/dL   AST 33 15 - 41 U/L   ALT 17 0 - 44 U/L   Alkaline Phosphatase 183 (H) 38 - 126 U/L   Total Bilirubin 2.0 (H) 0.3 - 1.2 mg/dL   GFR, Estimated >13 >08 mL/min    Comment: (NOTE) Calculated using the CKD-EPI Creatinine Equation (2021)    Anion gap 5 5 - 15    Comment: Performed at [facility], [address], [place], [place] [address]  CBC with Differential     Status: Abnormal   Collection Time: 03/13/21  6:22 AM  Result Value Ref Range   WBC 5.6 4.0 - 10.5 K/uL   RBC 2.12 (L) 4.22 - 5.81 MIL/uL   Hemoglobin 6.5 (LL) 13.0 - 17.0 g/dL    Comment: This critical  result has verified and been called  to HUI PENG RN by Rozelle Logan on 05 23 2022 at 0703, and has been read back. CRITICAL RESULT VERIFIED   HCT 19.0 (L) 39.0 - 52.0 %   MCV 89.6 80.0 - 100.0 fL   MCH 30.7 26.0 - 34.0 pg   MCHC 34.2 30.0 - 36.0 g/dL   RDW 18.8 (H) 41.6 - 60.6 %   Platelets 182 150 - 400 K/uL   nRBC 10.8 (H) 0.0 - 0.2 %   Neutrophils Relative % 73 %   Neutro Abs 4.1 1.7 - 7.7 K/uL   Lymphocytes Relative 19 %   Lymphs Abs 1.1 0.7 - 4.0 K/uL   Monocytes Relative 7 %   Monocytes Absolute 0.4 0.1 - 1.0 K/uL   Eosinophils Relative 0 %   Eosinophils Absolute 0.0 0.0 - 0.5 K/uL   Basophils Relative 0 %   Basophils Absolute 0.0 0.0 - 0.1 K/uL   Immature Granulocytes 1 %   Abs Immature Granulocytes 0.05 0.00 - 0.07 K/uL   Pappenheimer Bodies PRESENT    Carollee Massed Bodies PRESENT    Polychromasia PRESENT    Sickle Cells PRESENT    Target Cells PRESENT    Giant PLTs PRESENT     Comment: Performed at [facility], [address] [place], [place] [address]  Troponin I (High Sensitivity)     Status: None   Collection Time: 03/13/21  6:28 AM  Result Value Ref Range   Troponin I (High Sensitivity) 6 <18 ng/L    Comment: (NOTE) Elevated high sensitivity troponin I (hsTnI) values and significant  changes across serial measurements may suggest ACS but many other  chronic and acute conditions are known to elevate hsTnI results.  Refer to the "Links" section for chest pain algorithms and additional  guidance. Performed at [facility], [address], [place], [place] [address]   Brain natriuretic peptide     Status: Abnormal   Collection Time: 03/13/21  6:28 AM  Result Value Ref Range   B Natriuretic Peptide 325.9 (H) 0.0 - 100.0 pg/mL    Comment: Performed at [facility], [address], [place], [place] [address]  Reticulocytes     Status: Abnormal   Collection Time: 03/13/21  8:37 AM  Result Value Ref Range   Retic Ct Pct 24.0 (H) 0.4 - 3.1 %    RBC. 2.15 (L) 4.22 - 5.81 MIL/uL   Retic Count, Absolute 504.0 (H) 19.0 - 186.0 K/uL   Immature Retic Fract 16.4 (H) 2.3 - 15.9 %    Comment: Performed at [facility], [address], [place], [place] [address]  Troponin I (High Sensitivity)     Status: None   Collection Time: 03/13/21  8:37 AM  Result Value Ref Range   Troponin I (High Sensitivity) 6 <18 ng/L    Comment: (NOTE) Elevated high sensitivity troponin I (hsTnI) values and significant  changes across serial measurements may suggest ACS but many other  chronic and acute conditions are known to elevate hsTnI results.  Refer to the "Links" section for chest pain algorithms and additional  guidance. Performed at [facility], [address], [place], [place] [address]   CBC     Status: Abnormal   Collection Time: 03/13/21 11:15 AM  Result Value Ref Range   WBC 6.7 4.0 - 10.5 K/uL   RBC 2.07 (L) 4.22 - 5.81 MIL/uL   Hemoglobin 6.4 (LL)  13.0 - 17.0 g/dL    Comment: This critical result has verified and been called to [facility] RN by Rozelle Logan on 05 23 2022 at 1136, and has been read back. CRITICAL RESULT VERIFIED   HCT 18.4 (L) 39.0 - 52.0 %   MCV 88.9 80.0 - 100.0 fL   MCH 30.9 26.0 - 34.0 pg   MCHC 34.8 30.0 - 36.0 g/dL   RDW 30.8 (H) 65.7 - 84.6 %   Platelets 171 150 - 400 K/uL   nRBC 9.1 (H) 0.0 - 0.2 %    Comment: Performed at [facility], [address], [place], [place] [address]  Creatinine, serum     Status: None   Collection Time: 03/13/21 11:15 AM  Result Value Ref Range   Creatinine, Ser 1.08 0.61 - 1.24 mg/dL   GFR, Estimated >28 >41 mL/min    Comment: (NOTE) Calculated using the CKD-EPI Creatinine Equation (2021) Performed at [facility], [address] [place], [place] [address]   CBC WITH DIFFERENTIAL     Status: Abnormal   Collection Time: 03/14/21  4:54 AM  Result Value Ref Range   WBC 7.1 4.0 - 10.5 K/uL   RBC 2.12  (L) 4.22 - 5.81 MIL/uL   Hemoglobin 6.6 (LL) 13.0 - 17.0 g/dL    Comment: This critical result has verified and been called to [facility]. by [facility] on 05 24 2022 at 0656, and has been read back.    HCT 19.2 (L) 39.0 - 52.0 %   MCV 90.6 80.0 - 100.0 fL   MCH 31.1 26.0 - 34.0 pg   MCHC 34.4 30.0 - 36.0 g/dL   RDW 10.2 (H) 72.5 - 36.6 %   Platelets 186 150 - 400 K/uL   nRBC 9.3 (H) 0.0 - 0.2 %   Neutrophils Relative % 39 %   Neutro Abs 2.8 1.7 - 7.7 K/uL   Lymphocytes Relative 41 %   Lymphs Abs 2.9 0.7 - 4.0 K/uL   Monocytes Relative 19 %   Monocytes Absolute 1.4 (H) 0.1 - 1.0 K/uL   Eosinophils Relative 1 %   Eosinophils Absolute 0.1 0.0 - 0.5 K/uL   Basophils Relative 0 %   Basophils Absolute 0.0 0.0 - 0.1 K/uL   Immature Granulocytes 0 %   Abs Immature Granulocytes 0.02 0.00 - 0.07 K/uL   Polychromasia PRESENT    Sickle Cells PRESENT    Target Cells PRESENT     Comment: Performed at [facility], [address] [place], [place] [address]  Comprehensive metabolic panel     Status: Abnormal   Collection Time: 03/14/21  4:54 AM  Result Value Ref Range   Sodium 137 135 - 145 mmol/L   Potassium 4.4 3.5 - 5.1 mmol/L   Chloride 109 98 - 111 mmol/L   CO2 25 22 - 32 mmol/L   Glucose, Bld 95 70 - 99 mg/dL    Comment: Glucose reference range applies only to samples taken after fasting for at least 8 hours.   BUN 19 6 - 20 mg/dL   Creatinine, Ser 7.42 0.61 - 1.24 mg/dL   Calcium 9.1 8.9 - 59.5 mg/dL   Total Protein 6.7 6.5 - 8.1 g/dL   Albumin 3.6 3.5 - 5.0 g/dL   AST 36 15 - 41 U/L   ALT 20 0 - 44 U/L   Alkaline Phosphatase 182 (H) 38 - 126 U/L   Total Bilirubin 2.1 (H) 0.3 -  1.2 mg/dL   GFR, Estimated >35 >00 mL/min    Comment: (NOTE) Calculated using the CKD-EPI Creatinine Equation (2021)    Anion gap 3 (L) 5 - 15    Comment: Performed at [facility], [address] [place], [place] [address]  Prepare RBC (crossmatch)     Status:  None   Collection Time: 03/14/21  8:43 AM  Result Value Ref Range   Order Confirmation      ORDER PROCESSED BY BLOOD BANK Performed at [facility], [address] [place], [place] [address]   Type and screen [facility]     Status: None (Preliminary result)   Collection Time: 03/14/21  9:41 AM  Result Value Ref Range   ABO/RH(D) O POS    Antibody Screen NEG    Sample Expiration 03/17/2021,2359    Unit Number [id number]    Blood Component Type RED CELLS,LR    Unit division 00    Status of Unit ISSUED    Donor AG Type NEGATIVE FOR E ANTIGEN NEGATIVE FOR KELL ANTIGEN    Transfusion Status OK TO TRANSFUSE    Crossmatch Result      Compatible Performed at [facility], [address], [place], [place] [address]     Current Facility-Administered Medications  Medication Dose Route Frequency Provider Last Rate Last Admin  . 0.9 %  sodium chloride infusion  250 mL Intravenous PRN Rometta Emery, MD      . acetaminophen (TYLENOL) tablet 650 mg  650 mg Oral Q4H PRN Mikeal Hawthorne, Mohammad L, MD      . enoxaparin (LOVENOX) injection 40 mg  40 mg Subcutaneous Q24H Earlie Lou L, MD   40 mg at 03/14/21 0915  . furosemide (LASIX) injection 20 mg  20 mg Intravenous Daily Rometta Emery, MD   20 mg at 03/14/21 0915  . ondansetron (ZOFRAN) injection 4 mg  4 mg Intravenous Q6H PRN Earlie Lou L, MD      . oxyCODONE (Oxy IR/ROXICODONE) immediate release tablet 10 mg  10 mg Oral TID PRN Rometta Emery, MD   10 mg at 03/14/21 1054  . sodium chloride flush (NS) 0.9 % injection 3 mL  3 mL Intravenous Q12H Earlie Lou L, MD   3 mL at 03/14/21 0915  . sodium chloride flush (NS) 0.9 % injection 3 mL  3 mL Intravenous PRN Rometta Emery, MD        Musculoskeletal: Strength & Muscle Tone: within normal limits Gait & Station: normal Patient leans: N/A  Psychiatric Specialty Exam: Physical Exam  Review of Systems  Blood  pressure 135/69, pulse (!) 56, temperature 97.9 F (36.6 C), temperature source Oral, resp. rate 15, height 6\' 3"  (1.905 m), weight 76.4 kg, SpO2 92 %.Body mass index is 21.05 kg/m.  General Appearance: Casual  Eye Contact:  Good  Speech:  Clear and Coherent and Normal Rate  Volume:  Normal  Mood:  Euthymic  Affect:  Blunt and Congruent  Thought Process:  Coherent, Goal Directed, Linear and Descriptions of Associations: Intact  Orientation:  Full (Time, Place, and Person)  Thought Content:  Logical  Suicidal Thoughts:  No  Homicidal Thoughts:  No  Memory:  Immediate;   Good Recent;   Good  Judgement:  Fair  Insight:  Good  Psychomotor Activity:  Normal  Concentration:  Concentration: Good and Attention Span: Good  Recall:  Good  Fund of Knowledge:  Good  Language:  Good  Akathisia:  Negative  Handed:  Right  AIMS (if indicated):     Assets:  Communication Skills Desire for Improvement Leisure Time  ADL's:  Intact  Cognition:  WNL  Sleep:        Treatment Plan Summary: Plan Psych cleared.  Discontinue patient suicidal ideation.  To contract for safety while in the unit.  -DC Recruitment consultantsafety sitter.  -Recommend outpatient substance resources for patient.   Disposition: No evidence of imminent risk to self or others at present.   Patient does not meet criteria for psychiatric inpatient admission. Supportive therapy provided about ongoing stressors. Refer to IOP.  Maryagnes Amosakia S Starkes-Perry, FNP 03/14/2021 12:37 PM

## 2021-03-14 NOTE — Progress Notes (Signed)
Pt smoking in bathroom. CN notified and education provided. MD paged and nicotine patch ordered. Patient stated that he did not have any more cigars or cigarettes.

## 2021-03-14 NOTE — Progress Notes (Signed)
Subjective: Patient is a 55 year old homeless guy with sickle cell disease who was admitted with sickle cell painful crisis a few weeks ago.  When he left he had significant change in his lifestyle.  Patient reported family dynamics and then problem with his probation officers.  This has led him to being suicidal.  At this point he came in with shortness of breath and hypoxia.  Patient appears to have new oxygen demand at 2 L/min.  Findings of's CHF.  Also hemoglobin dropping to 6.57.6 range.  Objective: Vital signs in last 24 hours: Temp:  [97.6 F (36.4 C)-98 F (36.7 C)] 97.8 F (36.6 C) (05/24 0618) Pulse Rate:  [47-80] 51 (05/24 0618) Resp:  [12-18] 18 (05/24 0211) BP: (100-136)/(48-84) 123/84 (05/24 0618) SpO2:  [93 %-100 %] 98 % (05/24 0618) Weight:  [76.4 kg] 76.4 kg (05/24 0618) Weight change: -0.258 kg    Intake/Output from previous day: 05/23 0701 - 05/24 0700 In: -  Out: 2825 [Urine:2825] Intake/Output this shift: No intake/output data recorded.  General appearance: alert, cooperative, appears stated age and no distress Neck: no adenopathy, no carotid bruit, no JVD, supple, symmetrical, trachea midline and thyroid not enlarged, symmetric, no tenderness/mass/nodules Back: symmetric, no curvature. ROM normal. No CVA tenderness. Resp: clear to auscultation bilaterally Cardio: regular rate and rhythm, S1, S2 normal, no murmur, click, rub or gallop GI: soft, non-tender; bowel sounds normal; no masses,  no organomegaly Extremities: extremities normal, atraumatic, no cyanosis or edema Pulses: 2+ and symmetric Skin: Skin color, texture, turgor normal. No rashes or lesions Neurologic: Grossly normal  Lab Results: Recent Labs    03/13/21 1115 03/14/21 0454  WBC 6.7 7.1  HGB 6.4* 6.6*  HCT 18.4* 19.2*  PLT 171 186   BMET Recent Labs    03/13/21 0622 03/13/21 1115 03/14/21 0454  NA 139  --  137  K 4.9  --  4.4  CL 112*  --  109  CO2 22  --  25  GLUCOSE 118*  --   95  BUN 22*  --  19  CREATININE 1.14 1.08 0.99  CALCIUM 8.9  --  9.1    Studies/Results: DG Chest 2 View  Result Date: 03/13/2021 CLINICAL DATA:  Hypoxia. EXAM: CHEST - 2 VIEW COMPARISON:  Chest x-ray 02/08/2021 FINDINGS: Enlarged cardiac silhouette stable. The heart size and mediastinal contours are unchanged. Aortic calcifications. No focal consolidation. Increased interstitial markings with Kerley B lines. No pleural effusion. No pneumothorax. No acute osseous abnormality. IMPRESSION: Pulmonary edema. Electronically Signed   By: Tish Frederickson M.D.   On: 03/13/2021 06:18   DG Lumbar Spine Complete  Result Date: 03/12/2021 CLINICAL DATA:  55 year old male with low back pain. EXAM: LUMBAR SPINE - COMPLETE 4+ VIEW COMPARISON:  12/26/2020 radiographs FINDINGS: Five non rib-bearing lumbar type vertebra are identified in normal alignment. There is no evidence of fracture or subluxation. No focal bony lesions or spondylolysis noted. Disc spaces are maintained. Heavy aortic atherosclerotic calcifications are present. IMPRESSION: Unremarkable lumbar spine. Aortic Atherosclerosis (ICD10-I70.0). Electronically Signed   By: Harmon Pier M.D.   On: 03/12/2021 13:35   ECHOCARDIOGRAM COMPLETE  Result Date: 03/13/2021    ECHOCARDIOGRAM REPORT   Patient Name:   Scott Norton Date of Exam: 03/13/2021 Medical Rec #:  [id number]    Height:       75.0 in Accession #:    [id number]   Weight:       169.0 lb Date of Birth:  02-Aug-1966    BSA:  2.043 m Patient Age:    55 years     BP:           100/48 mmHg Patient Gender: M            HR:           58 bpm. Exam Location:  Inpatient Procedure: 2D Echo, Cardiac Doppler and Color Doppler Indications:    I50.40* Unspecified combined systolic (congestive) and diastolic                 (congestive) heart failure  History:        Patient has no prior history of Echocardiogram examinations.                 CHF. Sickle cell anemia. Cocaine use.  Sonographer:    Sheralyn Boatman  RDCS Referring Phys: [id number] Istvan Behar L Devona Holmes IMPRESSIONS  1. Left ventricular ejection fraction, by estimation, is 55 to 60%. The left ventricle has normal function. The left ventricle has no regional wall motion abnormalities. The left ventricular internal cavity size was mildly dilated. Left ventricular diastolic parameters are consistent with Grade II diastolic dysfunction (pseudonormalization).  2. Right ventricular systolic function is normal. The right ventricular size is normal. There is mildly elevated pulmonary artery systolic pressure. The estimated right ventricular systolic pressure is 43.9 mmHg.  3. Left atrial size was moderately dilated.  4. Right atrial size was mildly dilated.  5. Calcification of the chordae tendinae. The mitral valve is abnormal. Moderate to severe mitral valve regurgitation, may not be fully visualized. No evidence of mitral stenosis. Moderate mitral annular calcification. Consider TEE for better assess of mitral valve.  6. The aortic valve is bicuspid. Aortic valve regurgitation is mild. Mild aortic valve stenosis. Aortic valve mean gradient measures 11.0 mmHg.  7. The inferior vena cava is dilated in size with <50% respiratory variability, suggesting right atrial pressure of 15 mmHg. FINDINGS  Left Ventricle: Left ventricular ejection fraction, by estimation, is 55 to 60%. The left ventricle has normal function. The left ventricle has no regional wall motion abnormalities. The left ventricular internal cavity size was mildly dilated. There is  no left ventricular hypertrophy. Left ventricular diastolic parameters are consistent with Grade II diastolic dysfunction (pseudonormalization). Right Ventricle: The right ventricular size is normal. No increase in right ventricular wall thickness. Right ventricular systolic function is normal. There is mildly elevated pulmonary artery systolic pressure. The tricuspid regurgitant velocity is 2.69  m/s, and with an assumed right atrial  pressure of 15 mmHg, the estimated right ventricular systolic pressure is 43.9 mmHg. Left Atrium: Left atrial size was moderately dilated. Right Atrium: Right atrial size was mildly dilated. Pericardium: There is no evidence of pericardial effusion. Mitral Valve: Calcification of the chordae tendinae. The mitral valve is abnormal. There is moderate calcification of the mitral valve leaflet(s). Moderate mitral annular calcification. Moderate to severe mitral valve regurgitation. No evidence of mitral  valve stenosis. MV peak gradient, 12.7 mmHg. The mean mitral valve gradient is 3.0 mmHg. Tricuspid Valve: The tricuspid valve is normal in structure. Tricuspid valve regurgitation is trivial. Aortic Valve: The aortic valve is bicuspid. Aortic valve regurgitation is mild. Aortic regurgitation PHT measures 738 msec. Mild aortic stenosis is present. Aortic valve mean gradient measures 11.0 mmHg. Aortic valve peak gradient measures 18.5 mmHg. Aortic valve area, by VTI measures 3.20 cm. Pulmonic Valve: The pulmonic valve was normal in structure. Pulmonic valve regurgitation is not visualized. Aorta: The aortic root is normal in size  and structure. Venous: The inferior vena cava is dilated in size with less than 50% respiratory variability, suggesting right atrial pressure of 15 mmHg. IAS/Shunts: No atrial level shunt detected by color flow Doppler.  LEFT VENTRICLE PLAX 2D LVIDd:         5.66 cm      Diastology LVIDs:         3.94 cm      LV e' medial:    6.85 cm/s LV PW:         1.61 cm      LV E/e' medial:  18.2 LV IVS:        0.99 cm      LV e' lateral:   7.62 cm/s LVOT diam:     2.30 cm      LV E/e' lateral: 16.4 LV SV:         162 LV SV Index:   79 LVOT Area:     4.15 cm  LV Volumes (MOD) LV vol d, MOD A2C: 220.0 ml LV vol d, MOD A4C: 198.0 ml LV vol s, MOD A2C: 64.7 ml LV vol s, MOD A4C: 79.4 ml LV SV MOD A2C:     155.3 ml LV SV MOD A4C:     198.0 ml LV SV MOD BP:      130.3 ml RIGHT VENTRICLE             IVC RV S  prime:     17.70 cm/s  IVC diam: 2.74 cm TAPSE (M-mode): 4.0 cm LEFT ATRIUM              Index       RIGHT ATRIUM           Index LA diam:        4.15 cm  2.03 cm/m  RA Area:     20.50 cm LA Vol (A2C):   103.0 ml 50.42 ml/m RA Volume:   57.50 ml  28.15 ml/m LA Vol (A4C):   90.8 ml  44.45 ml/m LA Biplane Vol: 100.0 ml 48.95 ml/m  AORTIC VALVE AV Area (Vmax):    3.09 cm AV Area (Vmean):   2.91 cm AV Area (VTI):     3.20 cm AV Vmax:           215.33 cm/s AV Vmean:          150.000 cm/s AV VTI:            0.505 m AV Peak Grad:      18.5 mmHg AV Mean Grad:      11.0 mmHg LVOT Vmax:         160.00 cm/s LVOT Vmean:        105.000 cm/s LVOT VTI:          0.389 m LVOT/AV VTI ratio: 0.77 AI PHT:            738 msec  AORTA Ao Root diam: 3.50 cm Ao Asc diam:  3.10 cm MITRAL VALVE                 TRICUSPID VALVE MV Area (PHT): 3.77 cm      TR Peak grad:   28.9 mmHg MV Peak grad:  12.7 mmHg     TR Vmax:        269.00 cm/s MV Mean grad:  3.0 mmHg MV Vmax:       1.78 m/s      SHUNTS MV Vmean:  70.2 cm/s     Systemic VTI:  0.39 m MV Decel Time: 201 msec      Systemic Diam: 2.30 cm MR Peak grad:    121.4 mmHg MR Mean grad:    83.0 mmHg MR Vmax:         551.00 cm/s MR Vmean:        433.3 cm/s MR PISA:         3.69 cm MR PISA Eff ROA: 26 mm MR PISA Radius:  0.77 cm MV E velocity: 125.00 cm/s MV A velocity: 95.80 cm/s MV E/A ratio:  1.30 Marca Ancona MD Electronically signed by Marca Ancona MD Signature Date/Time: 03/13/2021/8:55:40 PM    Final     Medications: I have reviewed the patient's current medications.  Assessment/Plan:  55 year old man with history of sickle cell disease admitted with new CHF.  #1 new onset CHF: Probably due to low oxygen carrying capacity due to significant anemia.  We will transfuse 1 unit of packed red blood cells.  Awaiting echocardiogram.  #2 sickle cell disease: No painful crisis.  Transfuse PRBC and monitor  #3 tobacco abuse: Counseling provided.  Nicotine patch  offered.  #4 cocaine abuse: Counseling provided.  #5 suicidal ideation: Psychiatric consultation with suicide precaution.  LOS: 1 day   Chavonne Sforza,LAWAL 03/14/2021, 8:38 AM

## 2021-03-14 NOTE — Progress Notes (Signed)
Date and time results received: 03/14/21  (use smartphrase ".now" to insert current time)  Test: hgb Critical Value: 6.6  Name of Provider Notified: Yes  Orders Received? Or Actions Taken?: MD aware

## 2021-03-14 NOTE — TOC Initial Note (Addendum)
Transition of Care [facility]) - Initial/Assessment Note    Patient Details  Name: Scott Norton MRN: [id number] Date of Birth: Oct 24, 1965  Transition of Care [facility]) CM/SW Contact:    Lanier Clam, RN Phone Number: 03/14/2021, 1:50 PM  Clinical Narrative: Psych eval-otpt resources f/u;patient now off SI sitter. Spoke to patient in rm about d/c plan-states [employer] rep Dow Adolph is following up on housing wait list-per patient permission-emailed Bennita about resources for housing, & update-await response. Noted patient on 02-will monitor. 2:34p-Received response from Bennita-she has just started working with patient in housing-but has not set up initial intake yet. Patient informed-patient will d/c to [facility] to be there before 2p for intake process-patient voiced understanding.Patient is very familiar with homeless list process. May need safe ride transport @ d/c.                 Expected Discharge Plan: Homeless Shelter Barriers to Discharge: Continued Medical Work up   Patient Goals and CMS Choice Patient states their goals for this hospitalization and ongoing recovery are:: homeless CMS Medicare.gov Compare Post Acute Care list provided to:: Patient    Expected Discharge Plan and Services Expected Discharge Plan: Homeless Shelter   Discharge Planning Services: CM Consult   Living arrangements for the past 2 months: Homeless Shelter                                      Prior Living Arrangements/Services Living arrangements for the past 2 months: Homeless Shelter Lives with:: Self Patient language and need for interpreter reviewed:: Yes Do you feel safe going back to the place where you live?: Yes      Need for Family Participation in Patient Care: No (Comment) Care giver support system in place?: Yes (comment)   Criminal Activity/Legal Involvement Pertinent to Current Situation/Hospitalization: No - Comment as needed  Activities of Daily Living Home Assistive Devices/Equipment:  None ADL Screening (condition at time of admission) Patient's cognitive ability adequate to safely complete daily activities?: Yes Is the patient deaf or have difficulty hearing?: No Does the patient have difficulty seeing, even when wearing glasses/contacts?: No Does the patient have difficulty concentrating, remembering, or making decisions?: No Patient able to express need for assistance with ADLs?: Yes Does the patient have difficulty dressing or bathing?: No Independently performs ADLs?: Yes (appropriate for developmental age) Does the patient have difficulty walking or climbing stairs?: No Weakness of Legs: None Weakness of Arms/Hands: None  Permission Sought/Granted Permission sought to share information with : Case Manager Permission granted to share information with : Yes, Verbal Permission Granted  Share Information with NAME: Case manager           Emotional Assessment Appearance:: Appears stated age Attitude/Demeanor/Rapport: Gracious Affect (typically observed): Accepting Orientation: : Oriented to Self,Oriented to Place,Oriented to  Time,Oriented to Situation Alcohol / Substance Use: Illicit Drugs Psych Involvement: Yes (comment)  Admission diagnosis:  Suicidal ideation [R45.851] Acute exacerbation of CHF (congestive heart failure) (HCC) [I50.9] Acute hypoxemic respiratory failure (HCC) [J96.01] Acute congestive heart failure, unspecified heart failure type [facility]) [I50.9] Patient Active Problem List   Diagnosis Date Noted  . Acute exacerbation of CHF (congestive heart failure) (HCC) 03/13/2021  . Depression 03/01/2021  . Cocaine abuse with cocaine-induced mood disorder (HCC) 03/01/2021  . Sickle-cell crisis (HCC) 02/08/2021  . Hyperkalemia 02/08/2021  . Thrombocytopenia (HCC) 02/08/2021  . Leukocytosis 02/08/2021  . AKI (acute kidney injury) (HCC) 02/08/2021  .  Cocaine abuse (HCC) 02/08/2021  . Homelessness 09/14/2020  . Suicidal ideations 09/04/2020  .  Tobacco use 09/04/2020  . Sickle cell pain crisis (HCC) 04/11/2015  . Vitamin D deficiency 01/31/2015  . Chronic pain 06/30/2014  . Respiratory infection 08/13/2012  . Healthcare maintenance 06/04/2012  . Cervical strain, acute 12/09/2011  . Neck sprain and strain 12/09/2011  . Sickle cell anemia (HCC) 11/29/2011  . Seasonal allergies 11/29/2011  . Seasonal allergic rhinitis 11/29/2011   PCP:  Barbette Merino, NP Pharmacy:   [facility] - [place], [place] - [address] [place] [address] Phone: [phone] Fax: [phone]  [facility] [address] [place] [address] Phone: [phone] Fax: [phone]  [facility] [phone] - [place], [place] - [address] AT [facility] [address] [place] [address] Phone: [phone] Fax: [phone]     Social Determinants of Health (SDOH) Interventions    Readmission Risk Interventions Readmission Risk Prevention Plan 03/14/2021  Transportation Screening Complete  Medication Review (RN Care Manager) Complete  PCP or Specialist appointment within 3-5 days of discharge Complete  HRI or Home Care Consult Complete  SW Recovery Care/Counseling Consult Complete  Palliative Care Screening Not Applicable  Skilled Nursing Facility Not Applicable  Some recent data might be hidden

## 2021-03-15 DIAGNOSIS — D571 Sickle-cell disease without crisis: Secondary | ICD-10-CM | POA: Diagnosis not present

## 2021-03-15 DIAGNOSIS — I509 Heart failure, unspecified: Secondary | ICD-10-CM | POA: Diagnosis not present

## 2021-03-15 DIAGNOSIS — F1410 Cocaine abuse, uncomplicated: Secondary | ICD-10-CM | POA: Diagnosis not present

## 2021-03-15 DIAGNOSIS — Z5900 Homelessness unspecified: Secondary | ICD-10-CM | POA: Diagnosis not present

## 2021-03-15 LAB — BPAM RBC
Blood Product Expiration Date: 202206162359
ISSUE DATE / TIME: 202205241139
Unit Type and Rh: 5100

## 2021-03-15 LAB — TYPE AND SCREEN
ABO/RH(D): O POS
Antibody Screen: NEGATIVE
Donor AG Type: NEGATIVE
Unit division: 0

## 2021-03-15 LAB — HEMOGLOBIN AND HEMATOCRIT, BLOOD
HCT: 23.8 % — ABNORMAL LOW (ref 39.0–52.0)
Hemoglobin: 8.2 g/dL — ABNORMAL LOW (ref 13.0–17.0)

## 2021-03-15 LAB — MAGNESIUM: Magnesium: 1.9 mg/dL (ref 1.7–2.4)

## 2021-03-15 NOTE — Progress Notes (Signed)
Subjective: Patient is status post transfusion of 1 packed red blood cells.  He seems to have improved his oxygen carrying capacity.  Also getting mildly diuresis.  Hemoglobin up to 8.2.  Generally better but still on oxygen.  Goal is to attempt titrating him off oxygen today.  Hopefully may be discharged tomorrow to [facility] before 2 PM.  He is homeless.  Otherwise he is doing better.  He will be discharged first in the morning tomorrow.  Objective: Vital signs in last 24 hours: Temp:  [97.3 F (36.3 C)-98.4 F (36.9 C)] 97.3 F (36.3 C) (05/25 1159) Pulse Rate:  [50-58] 58 (05/25 1159) Resp:  [16-18] 17 (05/25 1159) BP: (109-130)/(69-81) 120/79 (05/25 1159) SpO2:  [94 %-98 %] 97 % (05/25 1159) Weight:  [71 kg] 71 kg (05/25 0500) Weight change: -5.4 kg    Intake/Output from previous day: 05/24 0701 - 05/25 0700 In: 1205.3 [P.O.:840; I.V.:32; Blood:333.3] Out: 1750 [Urine:1750] Intake/Output this shift: No intake/output data recorded.  General appearance: alert, cooperative, appears stated age and no distress Neck: no adenopathy, no carotid bruit, no JVD, supple, symmetrical, trachea midline and thyroid not enlarged, symmetric, no tenderness/mass/nodules Back: symmetric, no curvature. ROM normal. No CVA tenderness. Resp: clear to auscultation bilaterally Cardio: regular rate and rhythm, S1, S2 normal, no murmur, click, rub or gallop GI: soft, non-tender; bowel sounds normal; no masses,  no organomegaly Extremities: extremities normal, atraumatic, no cyanosis or edema Pulses: 2+ and symmetric Skin: Skin color, texture, turgor normal. No rashes or lesions Neurologic: Grossly normal  Lab Results: Recent Labs    03/13/21 1115 03/14/21 0454 03/15/21 0755  WBC 6.7 7.1  --   HGB 6.4* 6.6* 8.2*  HCT 18.4* 19.2* 23.8*  PLT 171 186  --    BMET Recent Labs    03/13/21 0622 03/13/21 1115 03/14/21 0454  NA 139  --  137  K 4.9  --  4.4  CL 112*  --  109  CO2 22  --  25  GLUCOSE  118*  --  95  BUN 22*  --  19  CREATININE 1.14 1.08 0.99  CALCIUM 8.9  --  9.1    Studies/Results: ECHOCARDIOGRAM COMPLETE  Result Date: 03/13/2021    ECHOCARDIOGRAM REPORT   Patient Name:   Scott Norton Date of Exam: 03/13/2021 Medical Rec #:  [id number]    Height:       75.0 in Accession #:    [id number]   Weight:       169.0 lb Date of Birth:  05-18-1966    BSA:          2.043 m Patient Age:    55 years     BP:           100/48 mmHg Patient Gender: M            HR:           58 bpm. Exam Location:  Inpatient Procedure: 2D Echo, Cardiac Doppler and Color Doppler Indications:    I50.40* Unspecified combined systolic (congestive) and diastolic                 (congestive) heart failure  History:        Patient has no prior history of Echocardiogram examinations.                 CHF. Sickle cell anemia. Cocaine use.  Sonographer:    Sheralyn Boatman RDCS Referring Phys: [id number] Ioana Louks L Raymont Andreoni IMPRESSIONS  1.  Left ventricular ejection fraction, by estimation, is 55 to 60%. The left ventricle has normal function. The left ventricle has no regional wall motion abnormalities. The left ventricular internal cavity size was mildly dilated. Left ventricular diastolic parameters are consistent with Grade II diastolic dysfunction (pseudonormalization).  2. Right ventricular systolic function is normal. The right ventricular size is normal. There is mildly elevated pulmonary artery systolic pressure. The estimated right ventricular systolic pressure is 43.9 mmHg.  3. Left atrial size was moderately dilated.  4. Right atrial size was mildly dilated.  5. Calcification of the chordae tendinae. The mitral valve is abnormal. Moderate to severe mitral valve regurgitation, may not be fully visualized. No evidence of mitral stenosis. Moderate mitral annular calcification. Consider TEE for better assess of mitral valve.  6. The aortic valve is bicuspid. Aortic valve regurgitation is mild. Mild aortic valve stenosis. Aortic valve mean  gradient measures 11.0 mmHg.  7. The inferior vena cava is dilated in size with <50% respiratory variability, suggesting right atrial pressure of 15 mmHg. FINDINGS  Left Ventricle: Left ventricular ejection fraction, by estimation, is 55 to 60%. The left ventricle has normal function. The left ventricle has no regional wall motion abnormalities. The left ventricular internal cavity size was mildly dilated. There is  no left ventricular hypertrophy. Left ventricular diastolic parameters are consistent with Grade II diastolic dysfunction (pseudonormalization). Right Ventricle: The right ventricular size is normal. No increase in right ventricular wall thickness. Right ventricular systolic function is normal. There is mildly elevated pulmonary artery systolic pressure. The tricuspid regurgitant velocity is 2.69  m/s, and with an assumed right atrial pressure of 15 mmHg, the estimated right ventricular systolic pressure is 43.9 mmHg. Left Atrium: Left atrial size was moderately dilated. Right Atrium: Right atrial size was mildly dilated. Pericardium: There is no evidence of pericardial effusion. Mitral Valve: Calcification of the chordae tendinae. The mitral valve is abnormal. There is moderate calcification of the mitral valve leaflet(s). Moderate mitral annular calcification. Moderate to severe mitral valve regurgitation. No evidence of mitral  valve stenosis. MV peak gradient, 12.7 mmHg. The mean mitral valve gradient is 3.0 mmHg. Tricuspid Valve: The tricuspid valve is normal in structure. Tricuspid valve regurgitation is trivial. Aortic Valve: The aortic valve is bicuspid. Aortic valve regurgitation is mild. Aortic regurgitation PHT measures 738 msec. Mild aortic stenosis is present. Aortic valve mean gradient measures 11.0 mmHg. Aortic valve peak gradient measures 18.5 mmHg. Aortic valve area, by VTI measures 3.20 cm. Pulmonic Valve: The pulmonic valve was normal in structure. Pulmonic valve regurgitation is not  visualized. Aorta: The aortic root is normal in size and structure. Venous: The inferior vena cava is dilated in size with less than 50% respiratory variability, suggesting right atrial pressure of 15 mmHg. IAS/Shunts: No atrial level shunt detected by color flow Doppler.  LEFT VENTRICLE PLAX 2D LVIDd:         5.66 cm      Diastology LVIDs:         3.94 cm      LV e' medial:    6.85 cm/s LV PW:         1.61 cm      LV E/e' medial:  18.2 LV IVS:        0.99 cm      LV e' lateral:   7.62 cm/s LVOT diam:     2.30 cm      LV E/e' lateral: 16.4 LV SV:  162 LV SV Index:   79 LVOT Area:     4.15 cm  LV Volumes (MOD) LV vol d, MOD A2C: 220.0 ml LV vol d, MOD A4C: 198.0 ml LV vol s, MOD A2C: 64.7 ml LV vol s, MOD A4C: 79.4 ml LV SV MOD A2C:     155.3 ml LV SV MOD A4C:     198.0 ml LV SV MOD BP:      130.3 ml RIGHT VENTRICLE             IVC RV S prime:     17.70 cm/s  IVC diam: 2.74 cm TAPSE (M-mode): 4.0 cm LEFT ATRIUM              Index       RIGHT ATRIUM           Index LA diam:        4.15 cm  2.03 cm/m  RA Area:     20.50 cm LA Vol (A2C):   103.0 ml 50.42 ml/m RA Volume:   57.50 ml  28.15 ml/m LA Vol (A4C):   90.8 ml  44.45 ml/m LA Biplane Vol: 100.0 ml 48.95 ml/m  AORTIC VALVE AV Area (Vmax):    3.09 cm AV Area (Vmean):   2.91 cm AV Area (VTI):     3.20 cm AV Vmax:           215.33 cm/s AV Vmean:          150.000 cm/s AV VTI:            0.505 m AV Peak Grad:      18.5 mmHg AV Mean Grad:      11.0 mmHg LVOT Vmax:         160.00 cm/s LVOT Vmean:        105.000 cm/s LVOT VTI:          0.389 m LVOT/AV VTI ratio: 0.77 AI PHT:            738 msec  AORTA Ao Root diam: 3.50 cm Ao Asc diam:  3.10 cm MITRAL VALVE                 TRICUSPID VALVE MV Area (PHT): 3.77 cm      TR Peak grad:   28.9 mmHg MV Peak grad:  12.7 mmHg     TR Vmax:        269.00 cm/s MV Mean grad:  3.0 mmHg MV Vmax:       1.78 m/s      SHUNTS MV Vmean:      70.2 cm/s     Systemic VTI:  0.39 m MV Decel Time: 201 msec      Systemic Diam: 2.30  cm MR Peak grad:    121.4 mmHg MR Mean grad:    83.0 mmHg MR Vmax:         551.00 cm/s MR Vmean:        433.3 cm/s MR PISA:         3.69 cm MR PISA Eff ROA: 26 mm MR PISA Radius:  0.77 cm MV E velocity: 125.00 cm/s MV A velocity: 95.80 cm/s MV E/A ratio:  1.30 Marca Ancona MD Electronically signed by Marca Ancona MD Signature Date/Time: 03/13/2021/8:55:40 PM    Final     Medications: I have reviewed the patient's current medications.  Assessment/Plan:  55 year old man with history of sickle cell disease admitted with new CHF.  #1 new onset  CHF: Echocardiogram showed normal EF.  It appears to be more diastolic dysfunction.  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  Posttransfusion he is improved.  He has chronic hypoxemia most likely from the same source.  #2 sickle cell disease: No painful crisis.  Transfuse PRBC and monitor  #3 tobacco abuse: Counseling provided.  Nicotine patch offered.  #4 cocaine abuse: Counseling provided.  #5 suicidal ideation: Psychiatric consultation with suicide precaution was done.  Patient cleared by psychiatry from IVC and from his psych situation.  At this point he is cleared for discharge.  Will be discharged to the homeless shelter in the morning.   LOS: 2 days   Jovanka Westgate,LAWAL 03/15/2021, 1:07 PM

## 2021-03-15 NOTE — TOC Transition Note (Signed)
Transition of Care [facility]) - CM/SW Discharge Note   Patient Details  Name: Scott Norton MRN: [id number] Date of Birth: [birth year]  [facility] CM/SW Contact:  Lanier Clam, RN Phone Number: 03/15/2021, 11:24 AM   Clinical Narrative:Can d/c to [facility]-must be there before 2p for services(patient familiar with resources there)They will f/u on shelter/transitional housing. No further CM needs.       Final next level of care: Homeless Shelter Barriers to Discharge: No Barriers Identified   Patient Goals and CMS Choice Patient states their goals for this hospitalization and ongoing recovery are:: homeless CMS Medicare.gov Compare Post Acute Care list provided to:: Patient    Discharge Placement                       Discharge Plan and Services   Discharge Planning Services: CM Consult                                 Social Determinants of Health (SDOH) Interventions     Readmission Risk Interventions Readmission Risk Prevention Plan 03/14/2021  Transportation Screening Complete  Medication Review (RN Care Manager) Complete  PCP or Specialist appointment within 3-5 days of discharge Complete  HRI or Home Care Consult Complete  SW Recovery Care/Counseling Consult Complete  Palliative Care Screening Not Applicable  Skilled Nursing Facility Not Applicable  Some recent data might be hidden

## 2021-03-16 ENCOUNTER — Other Ambulatory Visit: Payer: Self-pay

## 2021-03-16 ENCOUNTER — Ambulatory Visit (HOSPITAL_COMMUNITY)
Admission: EM | Admit: 2021-03-16 | Discharge: 2021-03-17 | Disposition: A | Discharge status: Home / Self Care | Payer: Medicare Other | Attending: Nurse Practitioner | Admitting: Nurse Practitioner

## 2021-03-16 ENCOUNTER — Other Ambulatory Visit (HOSPITAL_COMMUNITY): Payer: Self-pay

## 2021-03-16 DIAGNOSIS — Z8249 Family history of ischemic heart disease and other diseases of the circulatory system: Secondary | ICD-10-CM | POA: Insufficient documentation

## 2021-03-16 DIAGNOSIS — Z6372 Alcoholism and drug addiction in family: Secondary | ICD-10-CM | POA: Insufficient documentation

## 2021-03-16 DIAGNOSIS — F332 Major depressive disorder, recurrent severe without psychotic features: Secondary | ICD-10-CM | POA: Insufficient documentation

## 2021-03-16 DIAGNOSIS — F1290 Cannabis use, unspecified, uncomplicated: Secondary | ICD-10-CM | POA: Insufficient documentation

## 2021-03-16 DIAGNOSIS — I5033 Acute on chronic diastolic (congestive) heart failure: Secondary | ICD-10-CM | POA: Diagnosis not present

## 2021-03-16 DIAGNOSIS — Z8659 Personal history of other mental and behavioral disorders: Secondary | ICD-10-CM | POA: Insufficient documentation

## 2021-03-16 DIAGNOSIS — Z79899 Other long term (current) drug therapy: Secondary | ICD-10-CM | POA: Insufficient documentation

## 2021-03-16 DIAGNOSIS — Z20822 Contact with and (suspected) exposure to covid-19: Secondary | ICD-10-CM | POA: Insufficient documentation

## 2021-03-16 DIAGNOSIS — D571 Sickle-cell disease without crisis: Secondary | ICD-10-CM | POA: Diagnosis not present

## 2021-03-16 DIAGNOSIS — Z5900 Homelessness unspecified: Secondary | ICD-10-CM | POA: Insufficient documentation

## 2021-03-16 DIAGNOSIS — R45851 Suicidal ideations: Secondary | ICD-10-CM | POA: Insufficient documentation

## 2021-03-16 DIAGNOSIS — F1410 Cocaine abuse, uncomplicated: Secondary | ICD-10-CM | POA: Insufficient documentation

## 2021-03-16 DIAGNOSIS — Z720 Tobacco use: Secondary | ICD-10-CM | POA: Diagnosis not present

## 2021-03-16 DIAGNOSIS — F17210 Nicotine dependence, cigarettes, uncomplicated: Secondary | ICD-10-CM | POA: Insufficient documentation

## 2021-03-16 MED ORDER — ESCITALOPRAM OXALATE 5 MG PO TABS
5.0000 mg | ORAL_TABLET | Freq: Every day | ORAL | Status: DC
  Administered 2021-03-17 (×2): 5 mg via ORAL
  Filled 2021-03-16 (×2): qty 1

## 2021-03-16 MED ORDER — OXYCODONE HCL 10 MG PO TABS
10.0000 mg | ORAL_TABLET | Freq: Three times a day (TID) | ORAL | 0 refills | Status: DC | PRN
  Filled 2021-03-16 – 2021-03-17 (×2): qty 30, 10d supply, fill #0

## 2021-03-16 MED ORDER — OXYCODONE HCL 5 MG PO TABS
10.0000 mg | ORAL_TABLET | Freq: Three times a day (TID) | ORAL | Status: DC | PRN
  Administered 2021-03-17: 10 mg via ORAL
  Filled 2021-03-16: qty 2

## 2021-03-16 MED ORDER — TRAZODONE HCL 50 MG PO TABS
50.0000 mg | ORAL_TABLET | Freq: Every evening | ORAL | Status: DC | PRN
  Administered 2021-03-17: 50 mg via ORAL
  Filled 2021-03-16: qty 1

## 2021-03-16 MED ORDER — MAGNESIUM HYDROXIDE 400 MG/5ML PO SUSP: 30.0000 mL | Freq: Every day | ORAL | Status: DC | PRN

## 2021-03-16 MED ORDER — ACETAMINOPHEN 325 MG PO TABS: 650.0000 mg | ORAL_TABLET | Freq: Four times a day (QID) | ORAL | Status: DC | PRN

## 2021-03-16 MED ORDER — ALUM & MAG HYDROXIDE-SIMETH 200-200-20 MG/5ML PO SUSP: 30.0000 mL | ORAL | Status: DC | PRN

## 2021-03-16 MED ORDER — ONDANSETRON 4 MG PO TBDP: 4.0000 mg | ORAL_TABLET | Freq: Three times a day (TID) | ORAL | Status: DC | PRN

## 2021-03-16 NOTE — BH Assessment (Signed)
Comprehensive Clinical Assessment (CCA) Note  03/17/2021 Scott Norton [id number]   Disposition: Nira ConnJason Berry, NP, recommends overnight observation for safety and stabilization with psych reassessment in the AM.   The patient demonstrates the following risk factors for suicide: Chronic risk factors for suicide include: N/A. Acute risk factors for suicide include: family or marital conflict. Protective factors for this patient include: positive social support and coping skills. Considering these factors, the overall suicide risk at this point appears to be high. Patient is not appropriate for outpatient follow up.  Flowsheet Row ED to Hosp-Admission (Discharged) from 03/13/2021 in [facility] ED from [facility]/12/2021 [facility] from 03/08/2021 in [facility]  C-SSRS RISK CATEGORY Error: Q3, 4, or 5 should not be populated when Q2 is No Error: Q3, 4, or 5 should not be populated when Q2 is No Error: Question 6 not populated     1.1  Scott Norton is a 55 year old male [facility] due to SI with plan to jump in front of train or [place] [address] [place] front of a tractor trailer truck. Patient reported onset of SI with plan was 12 hours ago. Patient was [facility] and on 03/13/21 patient was overnight observation. Patient reported worsening depressive symptoms. Patient he continues to be homeless and has no support system. Patient reported main stressors include being homeless and pain related to sickle cell. Patient reports HI towards someone he loaned money to, but denies any intent or plan. Patient denies psychosis. Patient was calm and cooperative during assessment.   Chief Complaint: SI with plan to jump in front of car or to [place] [address] in front of tractor trailer truck.  Visit Diagnosis: Major depressive disorder  CCA  Screening, Triage and Referral (STR)  Patient Reported Information How did you hear about us? Self  Referral name: No data recorded Referral phone number: No data recorded  Whom do you see for routine medical problems? I don't have a doctor  Practice/Facility Name: [facility]  Practice/Facility Phone Number: No data recorded Name of Contact: No data recorded Contact Number: No data recorded Contact Fax Number: No data recorded Prescriber Name: No data recorded Prescriber Address (if known): No data recorded  What Is the Reason for Your Visit/Call Today? SI with plan to jump in front of train or [place] [address] [place] front of tractor trailer truck.  How Long Has This Been Causing You Problems? <Week  What Do You Feel Would Help You the Most Today? Treatment for Depression or other mood problem; Medication(s)  Have You Recently Been in Any Inpatient Treatment (Hospital/Detox/Crisis Center/28-Day Program)? No  Name/Location of Program/Hospital:[facility]  How Long Were You There? 9 days  When Were You Discharged? No data recorded  [facility] You [facility]? Yes  Who Do You [facility] at [facility]? pt has been seen before presenting with similar symptoms   Have You Recently Had Any Thoughts About Hurting Yourself? Yes  Are You Planning to Commit Suicide/Harm Yourself At This time? Yes  Have you Recently Had Thoughts About Hurting Someone Karolee Ohslse? No  Explanation: "Someone that took my money"  Have You Used Any Alcohol or Drugs in the Past 24 Hours? No  How Long Ago Did You Use Drugs or Alcohol? No data recorded What Did You Use and  How Much? 1 pint alcohol  Do You Currently Have a Therapist/Psychiatrist? No  Name of Therapist/Psychiatrist: No data recorded  Have You Been Recently Discharged From Any Office Practice or Programs? No  Explanation of Discharge From Practice/Program: No data recorded   CCA Screening Triage Referral  Assessment Type of Contact: Face-to-Face  Is this Initial or Reassessment? No data recorded Date Telepsych consult ordered in [facility]:   Time Telepsych consult ordered in [facility]:    Patient Reported Information Reviewed? Yes  Patient Left Without Being Seen? No data recorded Reason for Not Completing Assessment: No data recorded  Collateral Involvement: none  Does Patient Have a Court Appointed Legal Guardian? No data recorded Name and Contact of Legal Guardian: Self.   If Minor and Not Living with Parent(s), Who has Custody? No data recorded Is CPS involved or ever been involved? Never  Is APS involved or ever been involved? Never  Patient Determined To Be At Risk for Harm To Self or Others Based on Review of Patient Reported Information or Presenting Complaint? Yes, for Self-Harm  Method: No data recorded Availability of Means: No data recorded Intent: No data recorded Notification Required: No data recorded Additional Information for Danger to Others Potential: No data recorded Additional Comments for Danger to Others Potential: No data recorded Are There Guns or Other Weapons in Your Home? No  Types of Guns/Weapons: No data recorded Are These Weapons Safely Secured?                            No data recorded Who Could Verify You Are Able To Have These Secured: No data recorded Do You Have any Outstanding Charges, Pending Court Dates, Parole/Probation? probation for selling narcotics  Contacted To Inform of Risk of Harm To Self or Others: Other: Comment (NA)  Location of Assessment: [facility]  Does Patient Present under Involuntary Commitment? No  IVC Papers Initial File Date: No data recorded  [place] of Residence: [place]  Patient Currently Receiving the Following Services: Not Receiving Services  Determination of Need: Emergent (2 hours)  Options For Referral: Medication Management; Intensive Outpatient Therapy; Inpatient Hospitalization  CCA  Biopsychosocial Intake/Chief Complaint:  SI with plan to jump in front of train or to jump [place] [address] in front of tractor trailer truck.  Current Symptoms/Problems: Ongoing depresssion and anxiety  Patient Reported Schizophrenia/Schizoaffective Diagnosis in Past: No  Strengths: Self awareness.  Preferences: Not assessed.  Abilities: Not assessed.  Type of Services Patient Feels are Needed: Inpatient treatment.  Initial Clinical Notes/Concerns: NA  Mental Health Symptoms Depression:  Hopelessness; Sleep (too much or little); Worthlessness; Fatigue   Duration of Depressive symptoms: Less than two weeks   Mania:  None   Anxiety:   Worrying; Fatigue   Psychosis:  None   Duration of Psychotic symptoms: No data recorded  Trauma:  None   Obsessions:  None   Compulsions:  None   Inattention:  None   Hyperactivity/Impulsivity:  N/A   Oppositional/Defiant Behaviors:  None   Emotional Irregularity:  N/A   Other Mood/Personality Symptoms:  No data recorded   Mental Status Exam Appearance and self-care  Stature:  Average   Weight:  Average weight   Clothing:  -- (Pt in scrubs.)   Grooming:  Normal   Cosmetic use:  None   Posture/gait:  Normal   Motor activity:  Not Remarkable   Sensorium  Attention:  Normal   Concentration:  Normal   Orientation:  X5   Recall/memory:  Normal   Affect and Mood  Affect:  Depressed   Mood:  Depressed   Relating  Eye contact:  Normal   Facial expression:  Responsive   Attitude toward examiner:  Cooperative   Thought and Language  Speech flow: Normal   Thought content:  Appropriate to Mood and Circumstances   Preoccupation:  None   Hallucinations:  None   Organization:  No data recorded  [employer] of Knowledge:  Fair   Intelligence:  Average   Abstraction:  Normal   Judgement:  Poor   Reality Testing:  Realistic   Insight:  Fair   Decision Making:  Normal   Social Functioning   Social Maturity:  Responsible   Social Judgement:  Normal   Stress  Stressors:  Housing; Optometrist Ability:  Exhausted   Skill Deficits:  Activities of daily living   Supports:  Support needed    Religion: Religion/Spirituality Are You A Religious Person?: No  Leisure/Recreation: Leisure / Recreation Do You Have Hobbies?: Yes Leisure and Hobbies: Watching Tv and playing video games  Exercise/Diet: Exercise/Diet Do You Exercise?: No Have You Gained or Lost A Significant Amount of Weight in the Past Six Months?: No Do You Follow a Special Diet?: No Do You Have Any Trouble Sleeping?: Yes Explanation of Sleeping Difficulties: Pt reported, his sleep was up and down.  CCA Employment/Education Employment/Work Situation: Employment / Work Situation Employment situation: On disability Why is patient on disability: Sickle Cell How long has patient been on disability: Since 1987 Patient's job has been impacted by current illness: No What is the longest time patient has a held a job?: 2 years Where was the patient employed at that time?: [employer] Has patient ever been in the [employer]?: No  Education: Education Last Grade Completed: 12 Name of High School: [employer] Did Garment/textile technologist From [employer]?: Yes (Pt got his GED.) Did You Attend College?: No Did You Attend Graduate School?: No Did You Have An Individualized Education Program (IIEP): No Did You Have Any Difficulty At School?: No  CCA Family/Childhood History Family and Relationship History: Family history Marital status: Single Are you sexually active?: No What is your sexual orientation?: Heterosexual Has your sexual activity been affected by drugs, alcohol, medication, or emotional stress?: No Does patient have children?: Yes How many children?: 3 How is patient's relationship with their children?: Pt reports oldest daughter lives in [place] and 2 other children are in foster care  Childhood History:   Childhood History By whom was/is the patient raised?: Adoptive parents Additional childhood history information: Pt reports his father passed 47 years ago and aunt adopted Pt at the age of 89 (Pt refers to aunt as mother) Description of patient's relationship with caregiver when they were a child: "It was great with my aunt" Patient's description of current relationship with people who raised him/her: "My aunt passed in 35 and I dont talk to my biological mother much" How were you disciplined when you got in trouble as a child/adolescent?: Spankings Does patient have siblings?: Yes Description of patient's current relationship with siblings: "We dont have much contact" Did patient suffer any verbal/emotional/physical/sexual abuse as a child?: No Did patient suffer from severe childhood neglect?: No Has patient ever been sexually abused/assaulted/raped as an adolescent or adult?: No Was the patient ever a victim of a crime or a disaster?: No Witnessed domestic violence?: No Has patient been affected  by domestic violence as an adult?: No  Child/Adolescent Assessment:   CCA Substance Use Alcohol/Drug Use: Alcohol / Drug Use Pain Medications: See MAR Prescriptions: See MAR Over the Counter: See MAR History of alcohol / drug use?: Yes Negative Consequences of Use: Financial,Legal Substance #1 Name of Substance 1: cocaine 1 - Age of First Use: UTA 1 - Amount (size/oz): varies 1 - Frequency: varies 1 - Duration: Ongoing. 1 - Last Use / Amount: "nothing recent" 1 - Method of Aquiring: UTA Substance #2 Name of Substance 2: THC 2 - Age of First Use: UTA 2 - Amount (size/oz): varies 2 - Frequency: varies 2 - Duration: Ongoing. 2 - Last Use / Amount: "nothing recent" 2 - Method of Aquiring: UTA 2 - Route of Substance Use: Oral.   ASAM's:  Six Dimensions of Multidimensional Assessment  Dimension 1:  Acute Intoxication and/or Withdrawal Potential:      Dimension 2:  Biomedical  Conditions and Complications:   Dimension 2:  Description of patient's biomedical conditions and  complications: Pt has Sickle Cell Anemia.  Dimension 3:  Emotional, Behavioral, or Cognitive Conditions and Complications:  Dimension 3:  Description of emotional, behavioral, or cognitive conditions and complications: Major Depressive Disorder.  Dimension 4:  Readiness to Change:     Dimension 5:  Relapse, Continued use, or Continued Problem Potential:  Dimension 5:  Relapse, continued use, or continued problem potential critiera description: Pt is homeless and lives in hotels.  Dimension 6:  Recovery/Living Environment:  Dimension 6:  Recovery/Iiving environment criteria description: Pt denies, supports and is currently homeless.  ASAM Severity Score:    ASAM Recommended Level of Treatment: ASAM Recommended Level of Treatment: Level II Intensive Outpatient Treatment   Substance use Disorder (SUD) Substance Use Disorder (SUD)  Checklist Symptoms of Substance Use: Continued use despite having a persistent/recurrent physical/psychological problem caused/exacerbated by use  Recommendations for Services/Supports/Treatments: Recommendations for Services/Supports/Treatments Recommendations For Services/Supports/Treatments: Inpatient Hospitalization  DSM5 Diagnoses: Patient Active Problem List   Diagnosis Date Noted  . Acute exacerbation of CHF (congestive heart failure) (HCC) 03/13/2021  . Depression 03/01/2021  . Cocaine abuse with cocaine-induced mood disorder (HCC) 03/01/2021  . Sickle-cell crisis (HCC) 02/08/2021  . Hyperkalemia 02/08/2021  . Thrombocytopenia (HCC) 02/08/2021  . Leukocytosis 02/08/2021  . AKI (acute kidney injury) (HCC) 02/08/2021  . Cocaine abuse (HCC) 02/08/2021  . Homelessness 09/14/2020  . Suicidal ideations 09/04/2020  . Tobacco use 09/04/2020  . Sickle cell pain crisis (HCC) 04/11/2015  . Vitamin D deficiency 01/31/2015  . Chronic pain 06/30/2014  . Respiratory  infection 08/13/2012  . Healthcare maintenance 06/04/2012  . Cervical strain, acute 12/09/2011  . Neck sprain and strain 12/09/2011  . Sickle cell anemia (HCC) 11/29/2011  . Seasonal allergies 11/29/2011  . Seasonal allergic rhinitis 11/29/2011   Patient Centered Plan: Patient is on the following Treatment Plan(s):   Referrals to Alternative Service(s): Referred to Alternative Service(s):   Place:   Date:   Time:    Referred to Alternative Service(s):   Place:   Date:   Time:    Referred to Alternative Service(s):   Place:   Date:   Time:    Referred to Alternative Service(s):   Place:   Date:   Time:     Burnetta Sabin, [facility]

## 2021-03-16 NOTE — Plan of Care (Signed)
  Problem: Education: Goal: Knowledge of General Education information will improve Description: Including pain rating scale, medication(s)/side effects and non-pharmacologic comfort measures Outcome: Progressing   Problem: Activity: Goal: Risk for activity intolerance will decrease Outcome: Progressing   Problem: Education: Goal: Ability to demonstrate management of disease process will improve Outcome: Progressing Goal: Ability to verbalize understanding of medication therapies will improve Outcome: Progressing Goal: Individualized Educational Video(s) Outcome: Progressing   Problem: Activity: Goal: Capacity to carry out activities will improve Outcome: Progressing   Problem: Cardiac: Goal: Ability to achieve and maintain adequate cardiopulmonary perfusion will improve Outcome: Progressing

## 2021-03-16 NOTE — ED Provider Notes (Signed)
Behavioral Health Admission [facility]&P [facility] & OBS)  Date: 03/16/21 Patient Name: Scott Norton MRN: [id number] Chief Complaint: No chief complaint on file.     Diagnoses:  Final diagnoses:  Severe recurrent major depression without psychotic features ([place])  Cocaine use disorder ([place])  Homelessness    HPI: Scott Norton is a 55 y.o. male with a history of sickle cell disease, depression, and cocaine use who presents voluntarily to [facility] with law enforcement due to [place] with a plan. Patient states that he was at the [facility] and was thinking of jumping in a front [place]. Patient states his stressors are homelessness and pain related to sickle cell. Patient reports HI towards someone he loaned money to, but denies any intent or plan. He denies auditory and visual hallucinations. No indication that he is responding to internal stimuli. He reports a history of alcohol use and cocaine use, states that he has not used either in at least 2 weeks. Denies use of other substances. UDS positive for oxycodone/morphine, otherwise negative.   Patient was admitted to medical unit at [facility] 03/13/21-03/16/21 due acute CHF and sickle cell anemia. He was seen by psychiatry and psych cleared. He was discharged with prescriptions for zofran and oxycodone. He was inpatient at [facility] 03/01/21-03/06/2021. He was discharged on lexapro 5 mg daily and trazodone prn, which he would like to restart.   PHQ 2-9:   Flowsheet Row ED to Hosp-Admission (Discharged) from 03/13/2021 in [facility] ED from 03/12/2021 in [facility] ED from 03/08/2021 in [place] DEPT  C-SSRS RISK CATEGORY Error: Q3, 4, or 5 should not be populated when Q2 is No Error: Q3, 4, or 5 should not be populated when Q2 is No Error: Question 6 not populated       Total Time spent with patient: 15  minutes  Musculoskeletal  Strength & Muscle Tone: within normal limits Gait & Station: normal Patient leans: N/A  Psychiatric Specialty Exam  Presentation General Appearance: Appropriate for Environment; Casual; Neat  Eye Contact:Good  Speech:Clear and Coherent; Normal Rate  Speech Volume:Normal  Handedness:Right   Mood and Affect  Mood:Depressed; Hopeless; Worthless  Affect:Appropriate; Congruent; Depressed   Thought Process  Thought Processes:Coherent  Descriptions of Associations:Intact  Orientation:Full (Time, Place and Person)  Thought Content:Logical  Diagnosis of Schizophrenia or Schizoaffective disorder in past: No   Hallucinations:Hallucinations: None  Ideas of Reference:None  Suicidal Thoughts:Suicidal Thoughts: Yes, Active SI Active Intent and/or Plan: With Intent; With Plan; With Means to Carry Out  Homicidal Thoughts:Homicidal Thoughts: Yes, Passive HI Passive Intent and/or Plan: Without Intent; Without Plan   Sensorium  Memory:Immediate Good; Recent Good; Remote Good  Judgment:Fair  Insight:Fair   Executive Functions  Concentration:Good  Attention Span:Good  Recall:Good  Fund of Knowledge:Good  Language:Good   Psychomotor Activity  Psychomotor Activity:Psychomotor Activity: Normal   Assets  Assets:Communication Skills; Desire for Improvement; Financial Resources/Insurance; Physical Health   Sleep  Sleep:Sleep: Good   Nutritional Assessment (For OBS and FBC admissions only) Has the patient had a weight loss or gain of 10 pounds or more in the last 3 months?: No Has the patient had a decrease in food intake/or appetite?: No Does the patient have dental problems?: No Does the patient have eating habits or behaviors that may be indicators of an eating disorder including binging or inducing vomiting?: No Has the patient recently lost weight  without trying?: No Has the patient been eating poorly because of a decreased  appetite?: No Malnutrition Screening Tool Score: 0    Physical Exam Constitutional:      General: He is not in acute distress.    Appearance: He is not ill-appearing, toxic-appearing or diaphoretic.  HENT:     Head: Normocephalic.     Right Ear: External ear normal.     Left Ear: External ear normal.  Eyes:     Pupils: Pupils are equal, round, and reactive to light.  Cardiovascular:     Rate and Rhythm: Normal rate.  Pulmonary:     Effort: Pulmonary effort is normal. No respiratory distress.  Musculoskeletal:        General: Normal range of motion.  Neurological:     Mental Status: He is alert and oriented to person, place, and time.  Psychiatric:        Mood and Affect: Mood is anxious and depressed.        Speech: Speech normal.        Behavior: Behavior is cooperative.        Thought Content: Thought content is not paranoid or delusional. Thought content includes homicidal and suicidal ideation. Thought content includes suicidal plan. Thought content does not include homicidal plan.    Review of Systems  Constitutional: Negative for chills, diaphoresis, fever, malaise/fatigue and weight loss.  HENT: Negative for congestion.   Respiratory: Negative for cough and shortness of breath.   Cardiovascular: Negative for chest pain and palpitations.  Gastrointestinal: Negative for diarrhea, nausea and vomiting.  Neurological: Negative for dizziness and seizures.  Psychiatric/Behavioral: Positive for depression, substance abuse and suicidal ideas. Negative for hallucinations and memory loss. The patient is nervous/anxious and has insomnia.   All other systems reviewed and are negative.   Blood pressure 129/76, pulse 98, temperature 98.2 F (36.8 C), temperature source Tympanic, resp. rate 16, SpO2 98 %. There is no height or weight on file to calculate BMI.  Past Psychiatric History: Depression. Cocaine use. He was inpatient at [facility] 03/01/21-03/06/2021. He was discharged on  lexapro 5 mg daily and trazodone prn.  Is the patient at risk to self? Yes  Has the patient been a risk to self in the past 6 months? Yes .    Has the patient been a risk to self within the distant past? No   Is the patient a risk to others? Yes   Has the patient been a risk to others in the past 6 months? No   Has the patient been a risk to others within the distant past? No   Past Medical History:  Past Medical History:  Diagnosis Date  . AKI (acute kidney injury) ([place]) 02/08/2021  . Anemia   . Depression   . Heart murmur   . Seasonal allergies    wool & grass  . Sickle cell anemia ([facility])     Past Surgical History:  Procedure Laterality Date  . NO PAST SURGERIES    . ROOT CANAL      Family History:  Family History  Problem Relation Age of Onset  . Alcohol abuse Mother   . Sickle cell trait Mother   . Hypertension Mother   . Alcohol abuse Father   . Sickle cell trait Father   . Early death Father        due to alcoholism  . Hypertension Father   . Hypertension Sister   . Hypertension Brother   . Hypertension Maternal  Grandmother   . Hypertension Maternal Grandfather   . Hypertension Paternal Grandmother   . Hypertension Paternal Grandfather     Social History:  Social History   Socioeconomic History  . Marital status: Single    Spouse name: Not on file  . Number of children: Not on file  . Years of education: Not on file  . Highest education level: Not on file  Occupational History  . Not on file  Tobacco Use  . Smoking status: Current Every Day Smoker    Packs/day: 0.50    Types: Cigars  . Smokeless tobacco: Never Used  Vaping Use  . Vaping Use: Never used  Substance and Sexual Activity  . Alcohol use: Yes    Comment: occasional but indicated etoh abuse on health hx questionaire  . Drug use: Yes    Types: Marijuana, Cocaine  . Sexual activity: Yes  Other Topics Concern  . Not on file  Social History Narrative   Lives with Friends in [place]. Working  on housing for himself   On disability for his SS disease      No children, no stable relationship   Social Determinants of Radio broadcast assistant Strain: Not on file  Food Insecurity: Not on file  Transportation Needs: Not on file  Physical Activity: Not on file  Stress: Not on file  Social Connections: Not on file  Intimate Partner Violence: Not on file    SDOH:  SDOH Screenings   Alcohol Screen: Low Risk   . Last Alcohol Screening Score (AUDIT): 2  Depression (PHQ2-9): Not on file  Financial Resource Strain: Not on file  Food Insecurity: Not on file  Housing: Not on file  Physical Activity: Not on file  Social Connections: Not on file  Stress: Not on file  Tobacco Use: High Risk  . Smoking Tobacco Use: Current Every Day Smoker  . Smokeless Tobacco Use: Never Used  Transportation Needs: Not on file    Last Labs:  Admission on 03/13/2021, Discharged on 03/16/2021  Component Date Value Ref Range Status  . SARS Coronavirus 2 by RT PCR 03/13/2021 NEGATIVE  NEGATIVE Final   Comment: (NOTE) SARS-CoV-2 target nucleic acids are NOT DETECTED.  The SARS-CoV-2 RNA is generally detectable in upper respiratory specimens during the acute phase of infection. The lowest concentration of SARS-CoV-2 viral copies this assay can detect is 138 copies/mL. A negative result does not preclude SARS-Cov-2 infection and should not be used as the sole basis for treatment or other patient management decisions. A negative result may occur with  improper specimen collection/handling, submission of specimen other than nasopharyngeal swab, presence of viral mutation(s) within the areas targeted by this assay, and inadequate number of viral copies(<138 copies/mL). A negative result must be combined with clinical observations, patient history, and epidemiological information. The expected result is Negative.  Fact Sheet for Patients:  EntrepreneurPulse.com.au  Fact Sheet  for Healthcare Providers:  IncredibleEmployment.be  This test is no                          t yet approved or cleared by the [place] FDA and  has been authorized for detection and/or diagnosis of SARS-CoV-2 by FDA under an Emergency Use Authorization (EUA). This EUA will remain  in effect (meaning this test can be used) for the duration of the COVID-19 declaration under Section 564(b)(1) of the Act, 21 U.S.C.section 360bbb-3(b)(1), unless the authorization is terminated  or revoked  sooner.      . Influenza A by PCR 03/13/2021 NEGATIVE  NEGATIVE Final  . Influenza B by PCR 03/13/2021 NEGATIVE  NEGATIVE Final   Comment: (NOTE) The Xpert Xpress SARS-CoV-2/FLU/RSV plus assay is intended as an aid in the diagnosis of influenza from Nasopharyngeal swab specimens and should not be used as a sole basis for treatment. Nasal washings and aspirates are unacceptable for Xpert Xpress SARS-CoV-2/FLU/RSV testing.  Fact Sheet for Patients: EntrepreneurPulse.com.au  Fact Sheet for Healthcare Providers: IncredibleEmployment.be  This test is not yet approved or cleared by the [place] FDA and has been authorized for detection and/or diagnosis of SARS-CoV-2 by FDA under an Emergency Use Authorization (EUA). This EUA will remain in effect (meaning this test can be used) for the duration of the COVID-19 declaration under Section 564(b)(1) of the Act, 21 U.S.C. section 360bbb-3(b)(1), unless the authorization is terminated or revoked.  Performed at [facility], [place] [address] [place], [place] [address]   . Sodium 03/13/2021 139  135 - 145 mmol/L Final  . Potassium 03/13/2021 4.9  3.5 - 5.1 mmol/L Final  . Chloride 03/13/2021 112* 98 - 111 mmol/L Final  . CO2 03/13/2021 22  22 - 32 mmol/L Final  . Glucose, Bld 03/13/2021 118* 70 - 99 mg/dL Final   Glucose reference range applies only to samples taken after  fasting for at least 8 hours.  . BUN 03/13/2021 22* 6 - 20 mg/dL Final  . Creatinine, Ser 03/13/2021 1.14  0.61 - 1.24 mg/dL Final  . Calcium 03/13/2021 8.9  8.9 - 10.3 mg/dL Final  . Total Protein 03/13/2021 6.7  6.5 - 8.1 g/dL Final  . Albumin 03/13/2021 3.7  3.5 - 5.0 g/dL Final  . AST 03/13/2021 33  15 - 41 U/L Final  . ALT 03/13/2021 17  0 - 44 U/L Final  . Alkaline Phosphatase 03/13/2021 183* 38 - 126 U/L Final  . Total Bilirubin 03/13/2021 2.0* 0.3 - 1.2 mg/dL Final  . GFR, Estimated 03/13/2021 >60  >60 mL/min Final   Comment: (NOTE) Calculated using the CKD-EPI Creatinine Equation (2021)   . Anion gap 03/13/2021 5  5 - 15 Final   Performed at [facility], [place] [address] [place], [place] [address]  . WBC 03/13/2021 5.6  4.0 - 10.5 K/uL Final  . RBC 03/13/2021 2.12* 4.22 - 5.81 MIL/uL Final  . Hemoglobin 03/13/2021 6.5* 13.0 - 17.0 g/dL Final   This critical result has verified and been called to HUI PENG RN by Marvell Fuller on 05 23 2022 at 0703, and has been read back. CRITICAL RESULT VERIFIED  . HCT 03/13/2021 19.0* 39.0 - 52.0 % Final  . MCV 03/13/2021 89.6  80.0 - 100.0 fL Final  . MCH 03/13/2021 30.7  26.0 - 34.0 pg Final  . MCHC 03/13/2021 34.2  30.0 - 36.0 g/dL Final  . RDW 03/13/2021 22.8* 11.5 - 15.5 % Final  . Platelets 03/13/2021 182  150 - 400 K/uL Final  . nRBC 03/13/2021 10.8* 0.0 - 0.2 % Final  . Neutrophils Relative % 03/13/2021 73  % Final  . Neutro Abs 03/13/2021 4.1  1.7 - 7.7 K/uL Final  . Lymphocytes Relative 03/13/2021 19  % Final  . Lymphs Abs 03/13/2021 1.1  0.7 - 4.0 K/uL Final  . Monocytes Relative 03/13/2021 7  % Final  . Monocytes Absolute 03/13/2021 0.4  0.1 - 1.0 K/uL Final  . Eosinophils Relative 03/13/2021 0  % Final  .  Eosinophils Absolute 03/13/2021 0.0  0.0 - 0.5 K/uL Final  . Basophils Relative 03/13/2021 0  % Final  . Basophils Absolute 03/13/2021 0.0  0.0 - 0.1 K/uL Final  . Immature Granulocytes 03/13/2021 1  %  Final  . Abs Immature Granulocytes 03/13/2021 0.05  0.00 - 0.07 K/uL Final  . Pappenheimer Bodies 03/13/2021 PRESENT   Final  . Tammy Sours Bodies 03/13/2021 PRESENT   Final  . Polychromasia 03/13/2021 PRESENT   Final  . Sickle Cells 03/13/2021 PRESENT   Final  . Target Cells 03/13/2021 PRESENT   Final  . Giant PLTs 03/13/2021 PRESENT   Final   Performed at [facility], [place] [address] [place], [place] [address]  . Opiates 03/13/2021 NONE DETECTED  NONE DETECTED Final  . Cocaine 03/13/2021 POSITIVE* NONE DETECTED Final  . Benzodiazepines 03/13/2021 NONE DETECTED  NONE DETECTED Final  . Amphetamines 03/13/2021 NONE DETECTED  NONE DETECTED Final  . Tetrahydrocannabinol 03/13/2021 NONE DETECTED  NONE DETECTED Final  . Barbiturates 03/13/2021 NONE DETECTED  NONE DETECTED Final   Comment: (NOTE) DRUG SCREEN FOR MEDICAL PURPOSES ONLY.  IF CONFIRMATION IS NEEDED FOR ANY PURPOSE, NOTIFY LAB WITHIN 5 DAYS.  LOWEST DETECTABLE LIMITS FOR URINE DRUG SCREEN Drug Class                     Cutoff (ng/mL) Amphetamine and metabolites    1000 Barbiturate and metabolites    200 Benzodiazepine                 291 Tricyclics and metabolites     300 Opiates and metabolites        300 Cocaine and metabolites        300 THC                            50 Performed at [facility], [place] [address], [place], [place] [address]   . Troponin I (High Sensitivity) 03/13/2021 6  <18 ng/L Final   Comment: (NOTE) Elevated high sensitivity troponin I (hsTnI) values and significant  changes across serial measurements may suggest ACS but many other  chronic and acute conditions are known to elevate hsTnI results.  Refer to the "Links" section for chest pain algorithms and additional  guidance. Performed at [facility], [place] [address], [place], [place] [address]   . B Natriuretic Peptide 03/13/2021 325.9* 0.0 - 100.0 pg/mL Final   Performed at [place] [address], [place], [place] [address]  . Retic Ct Pct 03/13/2021 24.0* 0.4 - 3.1 % Final  . RBC. 03/13/2021 2.15* 4.22 - 5.81 MIL/uL Final  . Retic Count, Absolute 03/13/2021 504.0* 19.0 - 186.0 K/uL Final  . Immature Retic Fract 03/13/2021 16.4* 2.3 - 15.9 % Final   Performed at [facility], [place] [address] [place], [place] [address]  . Troponin I (High Sensitivity) 03/13/2021 6  <18 ng/L Final   Comment: (NOTE) Elevated high sensitivity troponin I (hsTnI) values and significant  changes across serial measurements may suggest ACS but many other  chronic and acute conditions are known to elevate hsTnI results.  Refer to the "Links" section for chest pain algorithms and additional  guidance. Performed at [facility], [place] [address], [place], [place] [address]   . Weight 03/13/2021 2,704  oz Final  . Height 03/13/2021 75  in Final  . BP 03/13/2021  100/48  mmHg Final  . Single Plane A2C EF 03/13/2021 70.6  % Final  . Single Plane A4C EF 03/13/2021 59.9  % Final  . Calc EF 03/13/2021 62.1  % Final  . S' Lateral 03/13/2021 3.94  cm Final  . AR max vel 03/13/2021 3.09  cm2 Final  . AV Area VTI 03/13/2021 3.20  cm2 Final  . AV Mean grad 03/13/2021 11.0  mmHg Final  . AV Peak grad 03/13/2021 18.5  mmHg Final  . Ao pk vel 03/13/2021 2.15  m/s Final  . P 1/2 time 03/13/2021 738  msec Final  . Area-P 1/2 03/13/2021 3.77  cm2 Final  . Radius 03/13/2021 0.77  cm Final  . MV M vel 03/13/2021 5.51  m/s Final  . AV Area mean vel 03/13/2021 2.91  cm2 Final  . MV Peak grad 03/13/2021 121.4  mmHg Final  . WBC 03/13/2021 6.7  4.0 - 10.5 K/uL Final  . RBC 03/13/2021 2.07* 4.22 - 5.81 MIL/uL Final  . Hemoglobin 03/13/2021 6.4* 13.0 - 17.0 g/dL Final   This critical result has verified and been called to [place] RN by Marvell Fuller on 05 23 2022 at 1136, and has been read back. CRITICAL RESULT VERIFIED  . HCT 03/13/2021 18.4* 39.0  - 52.0 % Final  . MCV 03/13/2021 88.9  80.0 - 100.0 fL Final  . MCH 03/13/2021 30.9  26.0 - 34.0 pg Final  . MCHC 03/13/2021 34.8  30.0 - 36.0 g/dL Final  . RDW 03/13/2021 22.6* 11.5 - 15.5 % Final  . Platelets 03/13/2021 171  150 - 400 K/uL Final  . nRBC 03/13/2021 9.1* 0.0 - 0.2 % Final   Performed at [facility], [place] [address] [place], [place] [address]  . Creatinine, Ser 03/13/2021 1.08  0.61 - 1.24 mg/dL Final  . GFR, Estimated 03/13/2021 >60  >60 mL/min Final   Comment: (NOTE) Calculated using the CKD-EPI Creatinine Equation (2021) Performed at [facility], [place] [address], [place], [place] [address]   . WBC 03/14/2021 7.1  4.0 - 10.5 K/uL Final  . RBC 03/14/2021 2.12* 4.22 - 5.81 MIL/uL Final  . Hemoglobin 03/14/2021 6.6* 13.0 - 17.0 g/dL Final   This critical result has verified and been called to [place]. by [facility] on 05 24 2022 at 0656, and has been read back.   Marland Kitchen HCT 03/14/2021 19.2* 39.0 - 52.0 % Final  . MCV 03/14/2021 90.6  80.0 - 100.0 fL Final  . MCH 03/14/2021 31.1  26.0 - 34.0 pg Final  . MCHC 03/14/2021 34.4  30.0 - 36.0 g/dL Final  . RDW 03/14/2021 22.7* 11.5 - 15.5 % Final  . Platelets 03/14/2021 186  150 - 400 K/uL Final  . nRBC 03/14/2021 9.3* 0.0 - 0.2 % Final  . Neutrophils Relative % 03/14/2021 39  % Final  . Neutro Abs 03/14/2021 2.8  1.7 - 7.7 K/uL Final  . Lymphocytes Relative 03/14/2021 41  % Final  . Lymphs Abs 03/14/2021 2.9  0.7 - 4.0 K/uL Final  . Monocytes Relative 03/14/2021 19  % Final  . Monocytes Absolute 03/14/2021 1.4* 0.1 - 1.0 K/uL Final  . Eosinophils Relative 03/14/2021 1  % Final  . Eosinophils Absolute 03/14/2021 0.1  0.0 - 0.5 K/uL Final  . Basophils Relative 03/14/2021 0  % Final  . Basophils Absolute 03/14/2021 0.0  0.0 - 0.1 K/uL Final  . Immature Granulocytes 03/14/2021 0  % Final  .  Abs Immature Granulocytes 03/14/2021 0.02  0.00 - 0.07 K/uL Final  . Polychromasia 03/14/2021  PRESENT   Final  . Sickle Cells 03/14/2021 PRESENT   Final  . Target Cells 03/14/2021 PRESENT   Final   Performed at [facility], [place] [address] [place], [place] [address]  . Sodium 03/14/2021 137  135 - 145 mmol/L Final  . Potassium 03/14/2021 4.4  3.5 - 5.1 mmol/L Final  . Chloride 03/14/2021 109  98 - 111 mmol/L Final  . CO2 03/14/2021 25  22 - 32 mmol/L Final  . Glucose, Bld 03/14/2021 95  70 - 99 mg/dL Final   Glucose reference range applies only to samples taken after fasting for at least 8 hours.  . BUN 03/14/2021 19  6 - 20 mg/dL Final  . Creatinine, Ser 03/14/2021 0.99  0.61 - 1.24 mg/dL Final  . Calcium 03/14/2021 9.1  8.9 - 10.3 mg/dL Final  . Total Protein 03/14/2021 6.7  6.5 - 8.1 g/dL Final  . Albumin 03/14/2021 3.6  3.5 - 5.0 g/dL Final  . AST 03/14/2021 36  15 - 41 U/L Final  . ALT 03/14/2021 20  0 - 44 U/L Final  . Alkaline Phosphatase 03/14/2021 182* 38 - 126 U/L Final  . Total Bilirubin 03/14/2021 2.1* 0.3 - 1.2 mg/dL Final  . GFR, Estimated 03/14/2021 >60  >60 mL/min Final   Comment: (NOTE) Calculated using the CKD-EPI Creatinine Equation (2021)   . Anion gap 03/14/2021 3* 5 - 15 Final   Performed at [facility], [place] [address] [place], [place] [address]  . Order Confirmation 03/14/2021    Final                   Value:ORDER PROCESSED BY BLOOD BANK Performed at [facility], [place] [address] [place], [place] [address]   . ABO/RH(D) 03/14/2021 O POS   Final  . Antibody Screen 03/14/2021 NEG   Final  . Sample Expiration 03/14/2021 03/17/2021,2359   Final  . Unit Number 03/14/2021 [id number]   Final  . Blood Component Type 03/14/2021 RED CELLS,LR   Final  . Unit division 03/14/2021 00   Final  . Status of Unit 03/14/2021 ISSUED,FINAL   Final  . Donor AG Type 03/14/2021 NEGATIVE FOR E ANTIGEN NEGATIVE FOR KELL ANTIGEN   Final  . Transfusion Status 03/14/2021 OK TO TRANSFUSE   Final  . Crossmatch  Result 03/14/2021    Final                   Value:Compatible Performed at [facility], [place] [address], [place], [place] [address]   . ISSUE DATE / TIME 03/14/2021 [id number]   Final  . Blood Product Unit Number 03/14/2021 [id number]   Final  . PRODUCT CODE 03/14/2021 [id number]   Final  . Unit Type and Rh 03/14/2021 5100   Final  . Blood Product Expiration Date 03/14/2021 [id number]   Final  . Hemoglobin 03/15/2021 8.2* 13.0 - 17.0 g/dL Final  . HCT 03/15/2021 23.8* 39.0 - 52.0 % Final   Performed at [facility], [place] [address], [place], [place] [address]  . Magnesium 03/15/2021 1.9  1.7 - 2.4 mg/dL Final   Performed at [place] [address] [place], [place] [address]  Admission on 03/12/2021, Discharged on 03/12/2021  Component Date Value Ref Range Status  . WBC 03/12/2021 8.9  4.0 - 10.5 K/uL Final  . RBC 03/12/2021  2.40* 4.22 - 5.81 MIL/uL Final  . Hemoglobin 03/12/2021 7.4* 13.0 - 17.0 g/dL Final  . HCT 03/12/2021 21.6* 39.0 - 52.0 % Final  . MCV 03/12/2021 90.0  80.0 - 100.0 fL Final  . MCH 03/12/2021 30.8  26.0 - 34.0 pg Final  . MCHC 03/12/2021 34.3  30.0 - 36.0 g/dL Final  . RDW 03/12/2021 22.2* 11.5 - 15.5 % Final  . Platelets 03/12/2021 174  150 - 400 K/uL Final  . nRBC 03/12/2021 6.5* 0.0 - 0.2 % Final  . Neutrophils Relative % 03/12/2021 66  % Final  . Neutro Abs 03/12/2021 5.8  1.7 - 7.7 K/uL Final  . Lymphocytes Relative 03/12/2021 20  % Final  . Lymphs Abs 03/12/2021 1.8  0.7 - 4.0 K/uL Final  . Monocytes Relative 03/12/2021 14  % Final  . Monocytes Absolute 03/12/2021 1.2* 0.1 - 1.0 K/uL Final  . Eosinophils Relative 03/12/2021 0  % Final  . Eosinophils Absolute 03/12/2021 0.0  0.0 - 0.5 K/uL Final  . Basophils Relative 03/12/2021 0  % Final  . Basophils Absolute 03/12/2021 0.0  0.0 - 0.1 K/uL Final  . Immature Granulocytes 03/12/2021 0  % Final  . Abs Immature Granulocytes 03/12/2021 0.03   0.00 - 0.07 K/uL Final  . Sickle Cells 03/12/2021 MARKED   Final   Performed at [facility], [place] [address] [place], [place] [address]  . Sodium 03/12/2021 138  135 - 145 mmol/L Final  . Potassium 03/12/2021 5.2* 3.5 - 5.1 mmol/L Final  . Chloride 03/12/2021 109  98 - 111 mmol/L Final  . CO2 03/12/2021 23  22 - 32 mmol/L Final  . Glucose, Bld 03/12/2021 95  70 - 99 mg/dL Final   Glucose reference range applies only to samples taken after fasting for at least 8 hours.  . BUN 03/12/2021 20  6 - 20 mg/dL Final  . Creatinine, Ser 03/12/2021 1.81* 0.61 - 1.24 mg/dL Final  . Calcium 03/12/2021 9.1  8.9 - 10.3 mg/dL Final  . GFR, Estimated 03/12/2021 44* >60 mL/min Final   Comment: (NOTE) Calculated using the CKD-EPI Creatinine Equation (2021)   . Anion gap 03/12/2021 6  5 - 15 Final   Performed at [place] [address] [place], [place] [address]  . Path Review 03/12/2021 Normocytic anemia with frequent sickle cells, target cells, polychromasia, and circulating nucleated RBCs. Absolute monocytosis.   Final   Comment: Reviewed by Jolene Provost, MD, PhD 03/13/2021. Performed at [facility], [place] [address], [place], [place] [address]   Admission on 03/11/2021, Discharged on 03/12/2021  Component Date Value Ref Range Status  . Sodium 03/11/2021 138  135 - 145 mmol/L Final  . Potassium 03/11/2021 4.4  3.5 - 5.1 mmol/L Final  . Chloride 03/11/2021 107  98 - 111 mmol/L Final  . CO2 03/11/2021 25  22 - 32 mmol/L Final  . Glucose, Bld 03/11/2021 82  70 - 99 mg/dL Final   Glucose reference range applies only to samples taken after fasting for at least 8 hours.  . BUN 03/11/2021 15  6 - 20 mg/dL Final  . Creatinine, Ser 03/11/2021 1.21  0.61 - 1.24 mg/dL Final  . Calcium 03/11/2021 9.2  8.9 - 10.3 mg/dL Final  . Total Protein 03/11/2021 7.2  6.5 - 8.1 g/dL Final  . Albumin 03/11/2021 3.8  3.5 - 5.0 g/dL Final  . AST 03/11/2021 33  15 - 41 U/L Final  . ALT 03/11/2021  19  0 - 44 U/L Final  . Alkaline Phosphatase 03/11/2021 221* 38 - 126 U/L Final  . Total Bilirubin 03/11/2021 2.0* 0.3 - 1.2 mg/dL Final  . GFR, Estimated 03/11/2021 >60  >60 mL/min Final   Comment: (NOTE) Calculated using the CKD-EPI Creatinine Equation (2021)   . Anion gap 03/11/2021 6  5 - 15 Final   Performed at [place] [address] [place], [place] [address]  . WBC 03/11/2021 6.4  4.0 - 10.5 K/uL Final  . RBC 03/11/2021 2.58* 4.22 - 5.81 MIL/uL Final  . Hemoglobin 03/11/2021 8.0* 13.0 - 17.0 g/dL Final  . HCT 03/11/2021 23.7* 39.0 - 52.0 % Final  . MCV 03/11/2021 91.9  80.0 - 100.0 fL Final  . MCH 03/11/2021 31.0  26.0 - 34.0 pg Final  . MCHC 03/11/2021 33.8  30.0 - 36.0 g/dL Final  . RDW 03/11/2021 22.0* 11.5 - 15.5 % Final  . Platelets 03/11/2021 184  150 - 400 K/uL Final  . nRBC 03/11/2021 9.1* 0.0 - 0.2 % Final  . Neutrophils Relative % 03/11/2021 33  % Final  . Neutro Abs 03/11/2021 2.1  1.7 - 7.7 K/uL Final  . Lymphocytes Relative 03/11/2021 45  % Final  . Lymphs Abs 03/11/2021 2.8  0.7 - 4.0 K/uL Final  . Monocytes Relative 03/11/2021 18  % Final  . Monocytes Absolute 03/11/2021 1.1* 0.1 - 1.0 K/uL Final  . Eosinophils Relative 03/11/2021 4  % Final  . Eosinophils Absolute 03/11/2021 0.2  0.0 - 0.5 K/uL Final  . Basophils Relative 03/11/2021 0  % Final  . Basophils Absolute 03/11/2021 0.0  0.0 - 0.1 K/uL Final  . Immature Granulocytes 03/11/2021 0  % Final  . Abs Immature Granulocytes 03/11/2021 0.01  0.00 - 0.07 K/uL Final  . Sickle Cells 03/11/2021 MARKED   Final   Performed at [facility], [place] [address] [place], [place] [address]  . Retic Ct Pct 03/11/2021 13.1* 0.4 - 3.1 % Final   RESULTS CONFIRMED BY MANUAL DILUTION  . RBC. 03/11/2021 2.61* 4.22 - 5.81 MIL/uL Final  . Retic Count, Absolute 03/11/2021 327.0* 19.0 - 186.0 K/uL Final  . Immature Retic Fract 03/11/2021 21.4* 2.3 - 15.9 % Final   Performed at [facility], [place]  [address] [place], [place] [address]  Admission on 03/08/2021, Discharged on 03/09/2021  Component Date Value Ref Range Status  . WBC 03/09/2021 7.1  4.0 - 10.5 K/uL Final   WHITE COUNT CONFIRMED ON SMEAR  . RBC 03/09/2021 2.40* 4.22 - 5.81 MIL/uL Final  . Hemoglobin 03/09/2021 7.5* 13.0 - 17.0 g/dL Final  . HCT 03/09/2021 22.2* 39.0 - 52.0 % Final  . MCV 03/09/2021 92.5  80.0 - 100.0 fL Final  . MCH 03/09/2021 31.3  26.0 - 34.0 pg Final  . MCHC 03/09/2021 33.8  30.0 - 36.0 g/dL Final  . RDW 03/09/2021 21.6* 11.5 - 15.5 % Final  . Platelets 03/09/2021 159  150 - 400 K/uL Final  . nRBC 03/09/2021 5.2* 0.0 - 0.2 % Final  . Neutrophils Relative % 03/09/2021 39  % Final  . Neutro Abs 03/09/2021 2.8  1.7 - 7.7 K/uL Final  . Lymphocytes Relative 03/09/2021 41  % Final  . Lymphs Abs 03/09/2021 3.0  0.7 - 4.0 K/uL Final  . Monocytes Relative 03/09/2021 17  % Final  . Monocytes Absolute 03/09/2021 1.2* 0.1 - 1.0 K/uL Final  . Eosinophils Relative 03/09/2021 2  % Final  .  Eosinophils Absolute 03/09/2021 0.1  0.0 - 0.5 K/uL Final  . Basophils Relative 03/09/2021 1  % Final  . Basophils Absolute 03/09/2021 0.0  0.0 - 0.1 K/uL Final  . Immature Granulocytes 03/09/2021 0  % Final  . Abs Immature Granulocytes 03/09/2021 0.02  0.00 - 0.07 K/uL Final  . Pappenheimer Bodies 03/09/2021 PRESENT   Final  . Tammy Sours Bodies 03/09/2021 PRESENT   Final  . Polychromasia 03/09/2021 PRESENT   Final  . Sickle Cells 03/09/2021 PRESENT   Final  . Target Cells 03/09/2021 PRESENT   Final   Performed at [facility], [place] [address] [place], [place] [address]  . Sodium 03/09/2021 141  135 - 145 mmol/L Final  . Potassium 03/09/2021 4.0  3.5 - 5.1 mmol/L Final  . Chloride 03/09/2021 108  98 - 111 mmol/L Final  . CO2 03/09/2021 25  22 - 32 mmol/L Final  . Glucose, Bld 03/09/2021 77  70 - 99 mg/dL Final   Glucose reference range applies only to samples taken after fasting for at least 8 hours.  . BUN  03/09/2021 17  6 - 20 mg/dL Final  . Creatinine, Ser 03/09/2021 1.00  0.61 - 1.24 mg/dL Final  . Calcium 03/09/2021 9.2  8.9 - 10.3 mg/dL Final  . Total Protein 03/09/2021 6.7  6.5 - 8.1 g/dL Final  . Albumin 03/09/2021 3.7  3.5 - 5.0 g/dL Final  . AST 03/09/2021 34  15 - 41 U/L Final  . ALT 03/09/2021 20  0 - 44 U/L Final  . Alkaline Phosphatase 03/09/2021 201* 38 - 126 U/L Final  . Total Bilirubin 03/09/2021 2.6* 0.3 - 1.2 mg/dL Final  . GFR, Estimated 03/09/2021 >60  >60 mL/min Final   Comment: (NOTE) Calculated using the CKD-EPI Creatinine Equation (2021)   . Anion gap 03/09/2021 8  5 - 15 Final   Performed at [facility], [place] [address] [place], [place] [address]  . Retic Ct Pct 03/09/2021 9.2* 0.4 - 3.1 % Final  . RBC. 03/09/2021 2.40* 4.22 - 5.81 MIL/uL Final  . Retic Count, Absolute 03/09/2021 219.6* 19.0 - 186.0 K/uL Final  . Immature Retic Fract 03/09/2021 28.9* 2.3 - 15.9 % Final   Performed at [facility], [place] [address] [place], [place] [address]  Admission on 03/01/2021, Discharged on 03/06/2021  Component Date Value Ref Range Status  . Hgb A1c MFr Bld 03/01/2021 <4.2* 4.8 - 5.6 % Final   Comment: (NOTE) **Verified by repeat analysis**         Prediabetes: 5.7 - 6.4         Diabetes: >6.4         Glycemic control for adults with diabetes: <7.0   . Mean Plasma Glucose 03/01/2021 <74  mg/dL Final   Comment: (NOTE) Performed At: [facility] [place], [place] [id number] Rush Farmer MD [id number]   . Cholesterol 03/01/2021 127  0 - 200 mg/dL Final  . Triglycerides 03/01/2021 89  <150 mg/dL Final  . HDL 03/01/2021 43  >40 mg/dL Final  . Total CHOL/HDL Ratio 03/01/2021 3.0  RATIO Final  . VLDL 03/01/2021 18  0 - 40 mg/dL Final  . LDL Cholesterol 03/01/2021 66  0 - 99 mg/dL Final   Comment:        Total Cholesterol/HDL:CHD Risk Coronary Heart Disease Risk Table  Men   Women   1/2 Average Risk   3.4   3.3  Average Risk       5.0   4.4  2 X Average Risk   9.6   7.1  3 X Average Risk  23.4   11.0        Use the calculated Patient Ratio above and the CHD Risk Table to determine the patient's CHD Risk.        ATP III CLASSIFICATION (LDL):  <100     mg/dL   Optimal  100-129  mg/dL   Near or Above                    Optimal  130-159  mg/dL   Borderline  160-189  mg/dL   High  >190     mg/dL   Very High Performed at [facility] [address], [place], [place] [address]   . TSH 03/01/2021 1.730  0.350 - 4.500 uIU/mL Final   Comment: Performed by a 3rd Generation assay with a functional sensitivity of <=0.01 uIU/mL. Performed at [facility], [place] [address] [place], [place] [address]   . Sodium 03/03/2021 137  135 - 145 mmol/L Final  . Potassium 03/03/2021 4.2  3.5 - 5.1 mmol/L Final  . Chloride 03/03/2021 105  98 - 111 mmol/L Final  . CO2 03/03/2021 27  22 - 32 mmol/L Final  . Glucose, Bld 03/03/2021 111* 70 - 99 mg/dL Final   Glucose reference range applies only to samples taken after fasting for at least 8 hours.  . BUN 03/03/2021 12  6 - 20 mg/dL Final  . Creatinine, Ser 03/03/2021 0.92  0.61 - 1.24 mg/dL Final  . Calcium 03/03/2021 9.2  8.9 - 10.3 mg/dL Final  . Total Protein 03/03/2021 7.1  6.5 - 8.1 g/dL Final  . Albumin 03/03/2021 4.0  3.5 - 5.0 g/dL Final  . AST 03/03/2021 45* 15 - 41 U/L Final  . ALT 03/03/2021 27  0 - 44 U/L Final  . Alkaline Phosphatase 03/03/2021 200* 38 - 126 U/L Final  . Total Bilirubin 03/03/2021 1.6* 0.3 - 1.2 mg/dL Final  . GFR, Estimated 03/03/2021 >60  >60 mL/min Final   Comment: (NOTE) Calculated using the CKD-EPI Creatinine Equation (2021)   . Anion gap 03/03/2021 5  5 - 15 Final   Performed at [facility], [place] [address] [place], [place] [address]  Admission on 02/27/2021, Discharged on 02/28/2021  Component Date Value Ref Range Status  . Sodium  02/27/2021 138  135 - 145 mmol/L Final  . Potassium 02/27/2021 3.5  3.5 - 5.1 mmol/L Final  . Chloride 02/27/2021 108  98 - 111 mmol/L Final  . CO2 02/27/2021 25  22 - 32 mmol/L Final  . Glucose, Bld 02/27/2021 104* 70 - 99 mg/dL Final   Glucose reference range applies only to samples taken after fasting for at least 8 hours.  . BUN 02/27/2021 14  6 - 20 mg/dL Final  . Creatinine, Ser 02/27/2021 1.39* 0.61 - 1.24 mg/dL Final  . Calcium 02/27/2021 9.1  8.9 - 10.3 mg/dL Final  . Total Protein 02/27/2021 6.9  6.5 - 8.1 g/dL Final  . Albumin 02/27/2021 3.8  3.5 - 5.0 g/dL Final  . AST 02/27/2021 27  15 - 41 U/L Final  . ALT 02/27/2021 13  0 - 44 U/L Final  . Alkaline Phosphatase 02/27/2021 193* 38 - 126 U/L Final  .  Total Bilirubin 02/27/2021 1.8* 0.3 - 1.2 mg/dL Final  . GFR, Estimated 02/27/2021 >60  >60 mL/min Final   Comment: (NOTE) Calculated using the CKD-EPI Creatinine Equation (2021)   . Anion gap 02/27/2021 5  5 - 15 Final   Performed at [facility], [place] [address] [place], [place] [address]  . Alcohol, Ethyl (B) 02/27/2021 <10  <10 mg/dL Final   Comment: (NOTE) Lowest detectable limit for serum alcohol is 10 mg/dL.  For medical purposes only. Performed at [facility], [place] [address] [place], [place] [address]   . Salicylate Lvl 93/81/8299 <7.0* 7.0 - 30.0 mg/dL Final   Performed at [facility] [address], [place], [place] [address]  . Acetaminophen (Tylenol), Serum 02/27/2021 <10* 10 - 30 ug/mL Final   Comment: (NOTE) Therapeutic concentrations vary significantly. A range of 10-30 ug/mL  may be an effective concentration for many patients. However, some  are best treated at concentrations outside of this range. Acetaminophen concentrations >150 ug/mL at 4 hours after ingestion  and >50 ug/mL at 12 hours after ingestion are often associated with  toxic reactions.  Performed at [facility], [place] [address] [place], [place] [address]   . WBC 02/27/2021 7.3  4.0 - 10.5 K/uL Final  . RBC 02/27/2021 2.79* 4.22 - 5.81 MIL/uL Final  . Hemoglobin 02/27/2021 8.7* 13.0 - 17.0 g/dL Final  . HCT 02/27/2021 26.7* 39.0 - 52.0 % Final  . MCV 02/27/2021 95.7  80.0 - 100.0 fL Final  . MCH 02/27/2021 31.2  26.0 - 34.0 pg Final  . MCHC 02/27/2021 32.6  30.0 - 36.0 g/dL Final  . RDW 02/27/2021 19.6* 11.5 - 15.5 % Final  . Platelets 02/27/2021 247  150 - 400 K/uL Final  . nRBC 02/27/2021 6.9* 0.0 - 0.2 % Final   Performed at [facility], [place] [address] [place], [place] [address]  . Opiates 02/27/2021 NONE DETECTED  NONE DETECTED Final  . Cocaine 02/27/2021 POSITIVE* NONE DETECTED Final  . Benzodiazepines 02/27/2021 NONE DETECTED  NONE DETECTED Final  . Amphetamines 02/27/2021 NONE DETECTED  NONE DETECTED Final  . Tetrahydrocannabinol 02/27/2021 NONE DETECTED  NONE DETECTED Final  . Barbiturates 02/27/2021 NONE DETECTED  NONE DETECTED Final   Comment: (NOTE) DRUG SCREEN FOR MEDICAL PURPOSES ONLY.  IF CONFIRMATION IS NEEDED FOR ANY PURPOSE, NOTIFY LAB WITHIN 5 DAYS.  LOWEST DETECTABLE LIMITS FOR URINE DRUG SCREEN Drug Class                     Cutoff (ng/mL) Amphetamine and metabolites    1000 Barbiturate and metabolites    200 Benzodiazepine                 510 Tricyclics and metabolites     300 Opiates and metabolites        300 Cocaine and metabolites        300 THC                            50 Performed at [facility], [place] [address] [place], [place] [address]   . SARS Coronavirus 2 by RT PCR 02/27/2021 NEGATIVE  NEGATIVE Final   Comment: (NOTE) SARS-CoV-2 target nucleic acids are NOT DETECTED.  The SARS-CoV-2 RNA is generally detectable in upper respiratory specimens during the acute phase of infection. The lowest concentration of SARS-CoV-2 viral copies this  assay can detect is 138 copies/mL. A negative result does not  preclude SARS-Cov-2 infection and should not be used as the sole basis for treatment or other patient management decisions. A negative result may occur with  improper specimen collection/handling, submission of specimen other than nasopharyngeal swab, presence of viral mutation(s) within the areas targeted by this assay, and inadequate number of viral copies(<138 copies/mL). A negative result must be combined with clinical observations, patient history, and epidemiological information. The expected result is Negative.  Fact Sheet for Patients:  EntrepreneurPulse.com.au  Fact Sheet for Healthcare Providers:  IncredibleEmployment.be  This test is no                          t yet approved or cleared by the [place] FDA and  has been authorized for detection and/or diagnosis of SARS-CoV-2 by FDA under an Emergency Use Authorization (EUA). This EUA will remain  in effect (meaning this test can be used) for the duration of the COVID-19 declaration under Section 564(b)(1) of the Act, 21 U.S.C.section 360bbb-3(b)(1), unless the authorization is terminated  or revoked sooner.      . Influenza A by PCR 02/27/2021 NEGATIVE  NEGATIVE Final  . Influenza B by PCR 02/27/2021 NEGATIVE  NEGATIVE Final   Comment: (NOTE) The Xpert Xpress SARS-CoV-2/FLU/RSV plus assay is intended as an aid in the diagnosis of influenza from Nasopharyngeal swab specimens and should not be used as a sole basis for treatment. Nasal washings and aspirates are unacceptable for Xpert Xpress SARS-CoV-2/FLU/RSV testing.  Fact Sheet for Patients: EntrepreneurPulse.com.au  Fact Sheet for Healthcare Providers: IncredibleEmployment.be  This test is not yet approved or cleared by the [place] FDA and has been authorized for detection and/or diagnosis of SARS-CoV-2 by FDA under an Emergency Use Authorization (EUA). This EUA will remain in  effect (meaning this test can be used) for the duration of the COVID-19 declaration under Section 564(b)(1) of the Act, 21 U.S.C. section 360bbb-3(b)(1), unless the authorization is terminated or revoked.  Performed at [facility], [place] [address], [place], [place] [address]   No results displayed because visit has over 200 results.    Admission on 02/07/2021, Discharged on 02/07/2021  Component Date Value Ref Range Status  . WBC 02/07/2021 9.4  4.0 - 10.5 K/uL Final  . RBC 02/07/2021 2.24* 4.22 - 5.81 MIL/uL Final  . Hemoglobin 02/07/2021 7.0* 13.0 - 17.0 g/dL Final  . HCT 02/07/2021 20.2* 39.0 - 52.0 % Final  . MCV 02/07/2021 90.2  80.0 - 100.0 fL Final  . MCH 02/07/2021 31.3  26.0 - 34.0 pg Final  . MCHC 02/07/2021 34.7  30.0 - 36.0 g/dL Final  . RDW 02/07/2021 23.9* 11.5 - 15.5 % Final  . Platelets 02/07/2021 175  150 - 400 K/uL Final  . nRBC 02/07/2021 4.7* 0.0 - 0.2 % Final  . Neutrophils Relative % 02/07/2021 43  % Final  . Neutro Abs 02/07/2021 4.2  1.7 - 7.7 K/uL Final  . Lymphocytes Relative 02/07/2021 35  % Final  . Lymphs Abs 02/07/2021 3.2  0.7 - 4.0 K/uL Final  . Monocytes Relative 02/07/2021 17  % Final  . Monocytes Absolute 02/07/2021 1.6* 0.1 - 1.0 K/uL Final  . Eosinophils Relative 02/07/2021 3  % Final  . Eosinophils Absolute 02/07/2021 0.3  0.0 - 0.5 K/uL Final  . Basophils Relative 02/07/2021 1  % Final  . Basophils Absolute 02/07/2021 0.1  0.0 - 0.1  K/uL Final  . Immature Granulocytes 02/07/2021 1  % Final  . Abs Immature Granulocytes 02/07/2021 0.05  0.00 - 0.07 K/uL Final  . Tammy Sours Bodies 02/07/2021 PRESENT   Final  . Polychromasia 02/07/2021 MARKED   Final  . Sickle Cells 02/07/2021 MARKED   Final  . Target Cells 02/07/2021 PRESENT   Final  . Ovalocytes 02/07/2021 PRESENT   Final   Performed at [facility], [place] [address], [place], [place] [address]  . Sodium 02/07/2021 139  135 - 145 mmol/L Final  .  Potassium 02/07/2021 4.6  3.5 - 5.1 mmol/L Final  . Chloride 02/07/2021 109  98 - 111 mmol/L Final  . CO2 02/07/2021 23  22 - 32 mmol/L Final  . Glucose, Bld 02/07/2021 102* 70 - 99 mg/dL Final   Glucose reference range applies only to samples taken after fasting for at least 8 hours.  . BUN 02/07/2021 18  6 - 20 mg/dL Final  . Creatinine, Ser 02/07/2021 1.61* 0.61 - 1.24 mg/dL Final  . Calcium 02/07/2021 9.1  8.9 - 10.3 mg/dL Final  . Total Protein 02/07/2021 7.0  6.5 - 8.1 g/dL Final  . Albumin 02/07/2021 3.9  3.5 - 5.0 g/dL Final  . AST 02/07/2021 40  15 - 41 U/L Final  . ALT 02/07/2021 15  0 - 44 U/L Final  . Alkaline Phosphatase 02/07/2021 203* 38 - 126 U/L Final  . Total Bilirubin 02/07/2021 2.1* 0.3 - 1.2 mg/dL Final  . GFR, Estimated 02/07/2021 51* >60 mL/min Final   Comment: (NOTE) Calculated using the CKD-EPI Creatinine Equation (2021)   . Anion gap 02/07/2021 7  5 - 15 Final   Performed at [facility], [place] [address] [place], [place] [address]  . Retic Ct Pct 02/07/2021 9.4* 0.4 - 3.1 % Final  . RBC. 02/07/2021 2.21* 4.22 - 5.81 MIL/uL Final  . Retic Count, Absolute 02/07/2021 207.5* 19.0 - 186.0 K/uL Final  . Immature Retic Fract 02/07/2021 48.5* 2.3 - 15.9 % Final   Performed at [facility], [place] [address], [place], [place] [address]  . Alcohol, Ethyl (B) 02/07/2021 <10  <10 mg/dL Final   Comment: (NOTE) Lowest detectable limit for serum alcohol is 10 mg/dL.  For medical purposes only. Performed at [facility], [place] [address] [place], [place] [address]   Office Visit on 12/30/2020  Component Date Value Ref Range Status  . Glucose 12/30/2020 80  65 - 99 mg/dL Final  . BUN 12/30/2020 22  6 - 24 mg/dL Final  . Creatinine, Ser 12/30/2020 1.48* 0.76 - 1.27 mg/dL Final  . eGFR 12/30/2020 56* >59 mL/min/1.73 Final  . BUN/Creatinine Ratio 12/30/2020 15  9 - 20 Final  . Sodium 12/30/2020 141  134 - 144 mmol/L  Final  . Potassium 12/30/2020 4.7  3.5 - 5.2 mmol/L Final  . Chloride 12/30/2020 104  96 - 106 mmol/L Final  . CO2 12/30/2020 21  20 - 29 mmol/L Final  . Calcium 12/30/2020 9.2  8.7 - 10.2 mg/dL Final  . Total Protein 12/30/2020 6.8  6.0 - 8.5 g/dL Final  . Albumin 12/30/2020 4.2  3.8 - 4.9 g/dL Final  . Globulin, Total 12/30/2020 2.6  1.5 - 4.5 g/dL Final  . Albumin/Globulin Ratio 12/30/2020 1.6  1.2 - 2.2 Final  . Bilirubin Total 12/30/2020 2.0* 0.0 - 1.2 mg/dL Final  . Alkaline Phosphatase 12/30/2020 204* 44 - 121 IU/L Final  . AST 12/30/2020 21  0 - 40 IU/L Final  . ALT 12/30/2020 14  0 - 44 IU/L Final  . Ferritin 12/30/2020 148  30 - 400 ng/mL Final  . Vit D, 25-Hydroxy 12/30/2020 18.7* 30.0 - 100.0 ng/mL Final   Comment: Vitamin D deficiency has been defined by the [place] practice guideline as a level of serum 25-OH vitamin D less than 20 ng/mL (1,2). The Endocrine Society went on to further define vitamin D insufficiency as a level between 21 and 29 ng/mL (2). 1. IOM (Institute of Medicine). 2010. Dietary reference    intakes for calcium and D. [place]: The    [employer]. 2. Holick MF, [place], Bischoff-Ferrari HA, et al.    Evaluation, treatment, and prevention of vitamin D    deficiency: an Endocrine Society clinical practice    guideline. JCEM. 2011 Jul; 96(7):1911-30.   . WBC 12/30/2020 11.5* 3.4 - 10.8 x10E3/uL Final   WBC corrected for nucleated RBC's.  Marland Kitchen RBC 12/30/2020 2.39* 4.14 - 5.80 x10E6/uL Final   Comment: Target cells present. Sickle cells. Polychromasia present Ella Jubilee bodies present.   . Hemoglobin 12/30/2020 7.5* 13.0 - 17.7 g/dL Final  . Hematocrit 12/30/2020 22.4* 37.5 - 51.0 % Final  . MCV 12/30/2020 94  79 - 97 fL Final  . MCH 12/30/2020 31.4  26.6 - 33.0 pg Final  . MCHC 12/30/2020 33.5  31.5 - 35.7 g/dL Final  . RDW 12/30/2020 22.2* 11.6 - 15.4 % Final  . Platelets 12/30/2020  228  150 - 450 x10E3/uL Final  . Neutrophils 12/30/2020 59  Not Estab. % Final  . Lymphs 12/30/2020 24  Not Estab. % Final  . Monocytes 12/30/2020 17  Not Estab. % Final  . Eos 12/30/2020 0  Not Estab. % Final  . Basos 12/30/2020 0  Not Estab. % Final  . Neutrophils Absolute 12/30/2020 6.8  1.4 - 7.0 x10E3/uL Final  . Lymphocytes Absolute 12/30/2020 2.8  0.7 - 3.1 x10E3/uL Final  . Monocytes Absolute 12/30/2020 1.9* 0.1 - 0.9 x10E3/uL Final  . EOS (ABSOLUTE) 12/30/2020 0.0  0.0 - 0.4 x10E3/uL Final  . Basophils Absolute 12/30/2020 0.0  0.0 - 0.2 x10E3/uL Final  . Immature Granulocytes 12/30/2020 0  Not Estab. % Final  . Immature Grans (Abs) 12/30/2020 0.0  0.0 - 0.1 x10E3/uL Final  . NRBC 12/30/2020 14* 0 - 0 % Final  . Hematology Comments: 12/30/2020 Note:   Final   Verified by microscopic examination.  . Retic Ct Pct 12/30/2020 9.0* 0.6 - 2.6 % Final  Admission on 12/26/2020, Discharged on 12/26/2020  Component Date Value Ref Range Status  . Sodium 12/26/2020 139  135 - 145 mmol/L Final  . Potassium 12/26/2020 4.1  3.5 - 5.1 mmol/L Final  . Chloride 12/26/2020 110  98 - 111 mmol/L Final  . CO2 12/26/2020 23  22 - 32 mmol/L Final  . Glucose, Bld 12/26/2020 109* 70 - 99 mg/dL Final   Glucose reference range applies only to samples taken after fasting for at least 8 hours.  . BUN 12/26/2020 13  6 - 20 mg/dL Final  . Creatinine, Ser 12/26/2020 1.00  0.61 - 1.24 mg/dL Final  . Calcium 12/26/2020 9.0  8.9 - 10.3 mg/dL Final  . Total Protein 12/26/2020 7.0  6.5 - 8.1 g/dL Final  . Albumin 12/26/2020 3.6  3.5 - 5.0 g/dL Final  . AST 12/26/2020 35  15 - 41 U/L Final  . ALT 12/26/2020 18  0 - 44 U/L Final  . Alkaline Phosphatase 12/26/2020 198* 38 - 126 U/L Final  . Total Bilirubin 12/26/2020 2.1* 0.3 - 1.2 mg/dL Final  . GFR, Estimated 12/26/2020 >60  >60 mL/min Final   Comment: (NOTE) Calculated using the CKD-EPI Creatinine Equation (2021)   . Anion gap 12/26/2020 6  5 - 15 Final    Performed at [facility], [place] [address], [place], [place] [address]  . WBC 12/26/2020 8.0  4.0 - 10.5 K/uL Final  . RBC 12/26/2020 2.20* 4.22 - 5.81 MIL/uL Final  . Hemoglobin 12/26/2020 7.0* 13.0 - 17.0 g/dL Final  . HCT 12/26/2020 20.6* 39.0 - 52.0 % Final  . MCV 12/26/2020 93.6  80.0 - 100.0 fL Final  . MCH 12/26/2020 31.8  26.0 - 34.0 pg Final  . MCHC 12/26/2020 34.0  30.0 - 36.0 g/dL Final  . RDW 12/26/2020 25.5* 11.5 - 15.5 % Final  . Platelets 12/26/2020 174  150 - 400 K/uL Final  . nRBC 12/26/2020 15.2* 0.0 - 0.2 % Final  . Neutrophils Relative % 12/26/2020 43  % Final  . Neutro Abs 12/26/2020 3.4  1.7 - 7.7 K/uL Final  . Lymphocytes Relative 12/26/2020 41  % Final  . Lymphs Abs 12/26/2020 3.3  0.7 - 4.0 K/uL Final  . Monocytes Relative 12/26/2020 11  % Final  . Monocytes Absolute 12/26/2020 0.9  0.1 - 1.0 K/uL Final  . Eosinophils Relative 12/26/2020 4  % Final  . Eosinophils Absolute 12/26/2020 0.4  0.0 - 0.5 K/uL Final  . Basophils Relative 12/26/2020 0  % Final  . Basophils Absolute 12/26/2020 0.0  0.0 - 0.1 K/uL Final  . Immature Granulocytes 12/26/2020 1  % Final  . Abs Immature Granulocytes 12/26/2020 0.05  0.00 - 0.07 K/uL Final  . Tammy Sours Bodies 12/26/2020 PRESENT   Final  . Polychromasia 12/26/2020 PRESENT   Final  . Sickle Cells 12/26/2020 MARKED   Final  . Target Cells 12/26/2020 PRESENT   Final   Performed at [facility], [place] [address] [place], [place] [address]  . Retic Ct Pct 12/26/2020 11.1* 0.4 - 3.1 % Final  . RBC. 12/26/2020 2.22* 4.22 - 5.81 MIL/uL Final  . Retic Count, Absolute 12/26/2020 247.1* 19.0 - 186.0 K/uL Final  . Immature Retic Fract 12/26/2020 47.2* 2.3 - 15.9 % Final   Performed at [facility], [place] [address] [place], [place] [address]  There may be more visits with results that are not included.    Allergies: Aspirin, Tape, and Tramadol  PTA Medications: (Not in a  hospital admission)   Medical Decision Making  Admission orders placed Patient discharged from hospital on 03/16/2021. Labs deferred.  Restart lexapro 5 mg daily for depression Restart trazodone 50 mg QHS prn for sleep  Continue home medications -zofran 4 mg every 8 hours prn nausea/vomiting -oxycodone 10 mg TID prn for chronic pain      Recommendations  Based on my evaluation the patient does not appear to have an emergency medical condition.  Rozetta Nunnery, NP 03/16/21  11:20 PM
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
## 2021-03-16 NOTE — Progress Notes (Signed)
WEnt over papers with patient.  All qeustions given.  Bus pass and AVS given.

## 2021-03-16 NOTE — TOC Transition Note (Signed)
Transition of Care [facility]) - CM/SW Discharge Note   Patient Details  Name: Scott Norton MRN: [id number] Date of Birth: [birth year]  [facility]) CM/SW Contact:  Lanier Clam, RN Phone Number: 03/16/2021, 9:14 AM   Clinical Narrative: d/c today to [facility] provided w/bus pass must be there by 2p-patient voiced understanding. No further CM needs see prior notes.      Final next level of care: Homeless Shelter Barriers to Discharge: No Barriers Identified   Patient Goals and CMS Choice Patient states their goals for this hospitalization and ongoing recovery are:: homeless CMS Medicare.gov Compare Post Acute Care list provided to:: Patient    Discharge Placement                       Discharge Plan and Services   Discharge Planning Services: CM Consult                                 Social Determinants of Health (SDOH) Interventions     Readmission Risk Interventions Readmission Risk Prevention Plan 03/14/2021  Transportation Screening Complete  Medication Review (RN Care Manager) Complete  PCP or Specialist appointment within 3-5 days of discharge Complete  HRI or Home Care Consult Complete  SW Recovery Care/Counseling Consult Complete  Palliative Care Screening Not Applicable  Skilled Nursing Facility Not Applicable  Some recent data might be hidden

## 2021-03-17 ENCOUNTER — Other Ambulatory Visit (HOSPITAL_COMMUNITY): Payer: Self-pay

## 2021-03-17 DIAGNOSIS — F1410 Cocaine abuse, uncomplicated: Secondary | ICD-10-CM | POA: Diagnosis not present

## 2021-03-17 DIAGNOSIS — Z5900 Homelessness unspecified: Secondary | ICD-10-CM | POA: Diagnosis not present

## 2021-03-17 DIAGNOSIS — F332 Major depressive disorder, recurrent severe without psychotic features: Secondary | ICD-10-CM | POA: Diagnosis not present

## 2021-03-17 LAB — POCT URINE DRUG SCREEN - MANUAL ENTRY (I-SCREEN)
POC Amphetamine UR: NOT DETECTED
POC Buprenorphine (BUP): NOT DETECTED
POC Cocaine UR: NOT DETECTED
POC Marijuana UR: NOT DETECTED
POC Methadone UR: NOT DETECTED
POC Methamphetamine UR: NOT DETECTED
POC Morphine: POSITIVE — AB
POC Oxazepam (BZO): NOT DETECTED
POC Oxycodone UR: POSITIVE — AB
POC Secobarbital (BAR): NOT DETECTED

## 2021-03-17 LAB — RESP PANEL BY RT-PCR (FLU A&B, COVID) ARPGX2
Influenza A by PCR: NEGATIVE
Influenza B by PCR: NEGATIVE
SARS Coronavirus 2 by RT PCR: NEGATIVE

## 2021-03-17 LAB — POC SARS CORONAVIRUS 2 AG -  ED: SARS Coronavirus 2 Ag: NEGATIVE

## 2021-03-17 LAB — POC SARS CORONAVIRUS 2 AG: SARSCOV2ONAVIRUS 2 AG: NEGATIVE

## 2021-03-17 MED ORDER — ESCITALOPRAM OXALATE 5 MG PO TABS: 5.0000 mg | ORAL_TABLET | Freq: Every day | ORAL | 0 refills | Status: DC

## 2021-03-17 NOTE — BH Assessment (Signed)
Scott Norton is a 55 year old male presenting voluntary to [facility] due to SI with plan to jump in front of train or to [place] [address] in front of a tractor trailer truck. Patient reported onset of SI with plan was 12 hours ago. Patient was discharged from inpatient on 03/12/21 at [facility] and on 03/13/21 patient was overnight observation. Patient reported worsening depressive symptoms. Patient he continues to be homeless and has no support system. Patient reported main stressors include being homeless and pain related to sickle cell. Patient reports HI towards someone he loaned money to, but denies any intent or plan. Patient denies psychosis. Patient was calm and cooperative during assessment.  Urgent.

## 2021-03-17 NOTE — ED Provider Notes (Signed)
FBC/OBS ASAP Discharge Summary  Date and Time: 03/17/2021 10:14 AM  Name: Scott Norton  MRN:  [id number]   Discharge Diagnoses:  Final diagnoses:  Severe recurrent major depression without psychotic features (HCC)  Cocaine use disorder (HCC)  Homelessness    Subjective: Patient presented to the [facility] with suicidal ideations with a plan to jump in front of a truck. Patient has history of MDD and cocaine use.  Patient expresses his concern of being homeless. Patient stated "it isnt right for people to just be able to put some one on the street". Patient is requesting housing resources. Patient is sitting up in his chair. He reports being depressed, but states his depression is on going. He makes good eye contact.He is calm and cooperative. His behavior is normal. He is alert and oriented x4. States he didn't sleep well "the nurses were too loud". Reports his appetite is good. Denies self harm and homicidal ideation. Denies any auditory or visual hallucinations, paranoia or delusions .Today patient states he is not sure if he is having suicidal ideations, but says if he had a plan it would be jumping in front of truck. States he has no intent, but states if he leaves, "I will just be on the street, and I cant say if I will be ok". Patient would not say one way or the other if he was actually suicidal or if would actually make an attempt. Patient was focused on having housing. Patient appears to be seeking secondary gain due to him needed shelter/housing.  Stay Summary: Patient was admitted to the obserservation unit for safety monitoring. Patients Oxy 10 TID PRN  And Lexapro was restarted on this stay, patient was compliant with medications. Patient has slept and shown no aggression during his stay. Social work was consulted for homelessness. Plan is to discharge patient with homeless resources.   Total Time spent with patient: 15 minutes  Past Psychiatric History: MDD, cocaine use, SI Past Medical  History:  Past Medical History:  Diagnosis Date  . AKI (acute kidney injury) (HCC) 02/08/2021  . Anemia   . Depression   . Heart murmur   . Seasonal allergies    wool & grass  . Sickle cell anemia (HCC)     Past Surgical History:  Procedure Laterality Date  . NO PAST SURGERIES    . ROOT CANAL     Family History:  Family History  Problem Relation Age of Onset  . Alcohol abuse Mother   . Sickle cell trait Mother   . Hypertension Mother   . Alcohol abuse Father   . Sickle cell trait Father   . Early death Father        due to alcoholism  . Hypertension Father   . Hypertension Sister   . Hypertension Brother   . Hypertension Maternal Grandmother   . Hypertension Maternal Grandfather   . Hypertension Paternal Grandmother   . Hypertension Paternal Grandfather    Family Psychiatric History: unkown Social History:  Social History   Substance and Sexual Activity  Alcohol Use Yes   Comment: occasional but indicated etoh abuse on health hx questionaire     Social History   Substance and Sexual Activity  Drug Use Yes  . Types: Marijuana, Cocaine    Social History   Socioeconomic History  . Marital status: Single    Spouse name: Not on file  . Number of children: Not on file  . Years of education: Not on file  .  Highest education level: Not on file  Occupational History  . Not on file  Tobacco Use  . Smoking status: Current Every Day Smoker    Packs/day: 0.50    Types: Cigars  . Smokeless tobacco: Never Used  Vaping Use  . Vaping Use: Never used  Substance and Sexual Activity  . Alcohol use: Yes    Comment: occasional but indicated etoh abuse on health hx questionaire  . Drug use: Yes    Types: Marijuana, Cocaine  . Sexual activity: Yes  Other Topics Concern  . Not on file  Social History Narrative   Lives with Friends in [place]. Working on housing for himself   On disability for his SS disease      No children, no stable relationship   Social  Determinants of Corporate investment banker Strain: Not on file  Food Insecurity: Not on file  Transportation Needs: Not on file  Physical Activity: Not on file  Stress: Not on file  Social Connections: Not on file   SDOH:  SDOH Screenings   Alcohol Screen: Low Risk   . Last Alcohol Screening Score (AUDIT): 2  Depression (PHQ2-9): Not on file  Financial Resource Strain: Not on file  Food Insecurity: Not on file  Housing: Not on file  Physical Activity: Not on file  Social Connections: Not on file  Stress: Not on file  Tobacco Use: High Risk  . Smoking Tobacco Use: Current Every Day Smoker  . Smokeless Tobacco Use: Never Used  Transportation Needs: Not on file    Has this patient used any form of tobacco in the last 30 days? (Cigarettes, Smokeless Tobacco, Cigars, and/or Pipes) Prescription not provided because:  pateint refused.   Current Medications:  Current Facility-Administered Medications  Medication Dose Route Frequency Provider Last Rate Last Admin  . acetaminophen (TYLENOL) tablet 650 mg  650 mg Oral Q6H PRN Nira Conn A, NP      . alum & mag hydroxide-simeth (MAALOX/MYLANTA) 200-200-20 MG/5ML suspension 30 mL  30 mL Oral Q4H PRN Nira Conn A, NP      . escitalopram (LEXAPRO) tablet 5 mg  5 mg Oral Daily Nira Conn A, NP   5 mg at 03/17/21 0918  . magnesium hydroxide (MILK OF MAGNESIA) suspension 30 mL  30 mL Oral Daily PRN Nira Conn A, NP      . ondansetron (ZOFRAN-ODT) disintegrating tablet 4 mg  4 mg Oral Q8H PRN Nira Conn A, NP      . oxyCODONE (Oxy IR/ROXICODONE) immediate release tablet 10 mg  10 mg Oral TID PRN Nira Conn A, NP   10 mg at 03/17/21 0345  . traZODone (DESYREL) tablet 50 mg  50 mg Oral QHS PRN Nira Conn A, NP   50 mg at 03/17/21 4081   Current Outpatient Medications  Medication Sig Dispense Refill  . ondansetron (ZOFRAN ODT) 4 MG disintegrating tablet Dissolve 1 tablet (4 mg total) by mouth every 8 (eight) hours as needed for  nausea or vomiting. 10 tablet 0  . Oxycodone HCl 10 MG TABS Take 1 tablet (10 mg total) by mouth 3 (three) times daily as needed (pain). 30 tablet 0    PTA Medications: (Not in a hospital admission)   Musculoskeletal  Strength & Muscle Tone: within normal limits Gait & Station: normal Patient leans: N/A  Psychiatric Specialty Exam  Presentation  General Appearance: Disheveled  Eye Contact:Good  Speech:Clear and Coherent; Normal Rate  Speech Volume:Normal  Handedness:Right   Mood  and Affect  Mood:Depressed  Affect:Congruent; Depressed   Thought Process  Thought Processes:Coherent  Descriptions of Associations:Intact  Orientation:Full (Time, Place and Person)  Thought Content:Logical  Diagnosis of Schizophrenia or Schizoaffective disorder in past: No    Hallucinations:Hallucinations: None  Ideas of Reference:None  Suicidal Thoughts:Suicidal Thoughts: Yes, Passive SI Active Intent and/or Plan: Without Intent; With Plan; With Means to Carry Out  Homicidal Thoughts:Homicidal Thoughts: No HI Passive Intent and/or Plan: Without Intent; Without Plan   Sensorium  Memory:Immediate Good; Recent Good; Remote Good  Judgment:Fair  Insight:Good   Executive Functions  Concentration:Good  Attention Span:Good  Recall:Good  Fund of Knowledge:Good  Language:Good   Psychomotor Activity  Psychomotor Activity:Psychomotor Activity: Normal   Assets  Assets:Communication Skills; Desire for Improvement; Leisure Time; Physical Health; Financial Resources/Insurance   Sleep  Sleep:Sleep: Fair   Nutritional Assessment (For OBS and FBC admissions only) Has the patient had a weight loss or gain of 10 pounds or more in the last 3 months?: No Has the patient had a decrease in food intake/or appetite?: No Does the patient have dental problems?: No Does the patient have eating habits or behaviors that may be indicators of an eating disorder including binging or  inducing vomiting?: No Has the patient recently lost weight without trying?: No Has the patient been eating poorly because of a decreased appetite?: No Malnutrition Screening Tool Score: 0    Physical Exam  Physical Exam Vitals and nursing note reviewed.  Constitutional:      Appearance: Normal appearance.  HENT:     Head: Normocephalic.     Right Ear: External ear normal.     Left Ear: External ear normal.  Eyes:     Conjunctiva/sclera: Conjunctivae normal.  Cardiovascular:     Rate and Rhythm: Normal rate.  Musculoskeletal:        General: Normal range of motion.     Cervical back: Normal range of motion.  Neurological:     Mental Status: He is alert and oriented to person, place, and time.    Review of Systems  Constitutional: Negative.   HENT: Negative.   Eyes: Negative.   Respiratory: Negative.   Cardiovascular: Negative.   Gastrointestinal: Negative.   Genitourinary: Negative.   Musculoskeletal: Negative.   Skin: Negative.   Neurological: Negative.   Endo/Heme/Allergies: Negative.   Psychiatric/Behavioral: Positive for substance abuse.   Blood pressure (!) 105/46, pulse 60, temperature 98.2 F (36.8 C), temperature source Oral, resp. rate 16, SpO2 95 %. There is no height or weight on file to calculate BMI.  Demographic Factors:  Low socioeconomic status and homelessness   Loss Factors: Legal issues and Financial problems/change in socioeconomic status  Historical Factors: Prior suicide attempts and Family history of mental illness or substance abuse  Risk Reduction Factors:   Positive social support  Continued Clinical Symptoms:  Depression:   Comorbid alcohol abuse/dependence Alcohol/Substance Abuse/Dependencies Chronic Pain  Cognitive Features That Contribute To Risk:  None    Suicide Risk:  Minimal: No identifiable suicidal ideation.  Patients presenting with no risk factors but with morbid ruminations; may be classified as minimal risk based  on the severity of the depressive symptoms  Plan Of Care/Follow-up recommendations:  Activity:  as tolerated  Diet:  regular Tests:  Resp Panel (Flu A & B, Covid), UDS Other:  n/a  Disposition: Discharged home with resources.   Ardis Hughs, NP 03/17/2021, 10:14 AM

## 2021-03-17 NOTE — ED Notes (Signed)
Pt given breakfast.

## 2021-03-17 NOTE — ED Notes (Signed)
Pt presented to the [facility] with suicidal ideations and homacidal  AEB pt statements of thought of wanting to kill himself. Pt also stated thoughts of wanting to harm a person that made him mad. Pt currently has a flat affect but behavior is self controlled with no aggressive bx  noted at this time. Pt admits to drug use but denies resent use.

## 2021-03-17 NOTE — Progress Notes (Signed)
CSW met with patient at request of provider and nurse. Patient need to call probation officer and get resources for homeless shelters and other resources.  CSW assisted patient with calling his probation officer and left a message.  CSW also provided resources in AVS and discussed different resources with patient in the community.    Scott Dercole, LCSW, [place] Social Worker  [employer]

## 2021-03-17 NOTE — Discharge Instructions (Addendum)

## 2021-03-17 NOTE — ED Notes (Signed)
Discharge instructions provided and Pt stated understanding. Personal belongings returned from locker. Pt alert, orient and ambulatory. Safety maintained.  

## 2021-03-17 NOTE — ED Notes (Signed)
Pt has a Consulting civil engineer located at the nurses station to charge his house arrest anklet

## 2021-03-17 NOTE — ED Notes (Signed)
Pt given meal

## 2021-03-18 NOTE — Discharge Summary (Signed)
Physician Discharge Summary Note   Patient:  Scott Norton is an 55 y.o., male MRN:  [id number] DOB:  [birth year] Patient phone:  [phone] (home)  Patient address:   [address] [place] [address],  Total Time spent with patient: 30 minutes  Date of Admission:  03/01/2021 Date of Discharge: 03/06/2021  Addendum to correct discharge date at top of chart. Initially entered as 5/15, correct  date of discharge is 03/06/2021. Chart resent to Dr Clifton Custard to attest for correction.   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 He has no previous psychiatric history. He stated he is on probation for the next 3 years for selling drugs to an undercover cop. He has a Civil [employer] fast streamer and the thought of calling her when he is discharged is making him depressed. He is calm, cooperative, pleasant on approach. He has been incarcerated in the past and stated he feels this parole officer is trying to find some reason to put him in jail again. Patient is homeless and has been living in week to week motels. He had to leave his motel on Monday and has not been able to find another one or an apartment. He stated he told his probation officer he had to leave his motel and she told him he needs an address by 7 PM that night. He decided to go to the hospital instead. He is a registered sex offender which adds to his difficulty finding a place to live. He receives SSI for his sickle cell. He sees [employer] at the sickle cel clinic. He was hospitalized 02/08/2021 for his sickle cell when his Hgb dropped to 6.1, currently his H/H  8.7/26.7. He is asymptomatic. His UDS is positive for cocaine, BAL negative. He denies daily drug use, he stated "if I have a dirty drug test my PO will send me to jail." He denies HI/AVH, paranoia and delusions. He stated he would feel suicidal  if he was told he had to be discharged today. He stated he feels safe in the hospital. His main issue at this time is housing and social stressors. He is mildly depressed. We discussed starting an antidepressant. He is open to this. Will start low dose Lexapro. Will continue to monitor for safety. Encouragement and support provided.   Evaluation on the unit: Patient was seen and evaluated. Patient denies SI/HI/AVH, paranoia and delusions. Patient is taking his medications and has no issues with them. He is calm and cooperative. He has been attending group therapy and interacting appropriately with staff and peers. He is eating and sleeping well. He is homeless and will go to the [facility] when discharged for assistance with housing/shelter needs. He has a history of sickle cell disease and does receive SSI. He has an appointment with [facility] on 5/19 at 1:00 PM for therapy and medication management. He has been instructed to call to keep the appointment. He is on parole and will call his parole officer to let her know he is being discharged. His VS are stable, he is afebrile. Patient is stable for discharge today.   Principal Problem: Cocaine abuse with cocaine-induced mood disorder [facility]) Discharge Diagnoses: Principal Problem:   Cocaine abuse with cocaine-induced mood disorder (HCC) Active Problems:   Sickle cell anemia (HCC)   Tobacco use   Homelessness   Cocaine  abuse [facility])   Depression  Past Psychiatric History: See H&P  Past Medical History:  Past Medical History:  Diagnosis Date  . AKI (acute kidney injury) (HCC) 02/08/2021  . Anemia   . Depression   . Heart murmur   . Seasonal allergies    wool & grass  . Sickle cell anemia (HCC)     Past Surgical History:  Procedure Laterality Date  . NO PAST SURGERIES    . ROOT CANAL     Family History:  Family History  Problem Relation Age of Onset  . Alcohol abuse Mother   . Sickle cell trait Mother   . Hypertension Mother   .  Alcohol abuse Father   . Sickle cell trait Father   . Early death Father        due to alcoholism  . Hypertension Father   . Hypertension Sister   . Hypertension Brother   . Hypertension Maternal Grandmother   . Hypertension Maternal Grandfather   . Hypertension Paternal Grandmother   . Hypertension Paternal Grandfather    Family Psychiatric  History: See H&P Social History:  Social History   Substance and Sexual Activity  Alcohol Use Yes   Comment: occasional but indicated etoh abuse on health hx questionaire     Social History   Substance and Sexual Activity  Drug Use Yes  . Types: Marijuana, Cocaine    Social History   Socioeconomic History  . Marital status: Single    Spouse name: Not on file  . Number of children: Not on file  . Years of education: Not on file  . Highest education level: Not on file  Occupational History  . Not on file  Tobacco Use  . Smoking status: Current Every Day Smoker    Packs/day: 0.50    Types: Cigars  . Smokeless tobacco: Never Used  Vaping Use  . Vaping Use: Never used  Substance and Sexual Activity  . Alcohol use: Yes    Comment: occasional but indicated etoh abuse on health hx questionaire  . Drug use: Yes    Types: Marijuana, Cocaine  . Sexual activity: Yes  Other Topics Concern  . Not on file  Social History Narrative   Lives with Friends in [place]. Working on housing for himself   On disability for his SS disease      No children, no stable relationship   Social Determinants of Corporate investment banker Strain: Not on file  Food Insecurity: Not on file  Transportation Needs: Not on file  Physical Activity: Not on file  Stress: Not on file  Social Connections: Not on file    Hospital Course:  After the above admission evaluation, Saber's  presenting symptoms were noted. He was recommended for mood stabilization treatments. The medication regimen targeting those presenting symptoms were discussed with him &  initiated with his consent. He was started on Lexapro for depression. He received Trazodone PRN at bedtime for sleep. His UDS on arrival to the ED was positive for cocaine. His BAL was negative. He was however medicated, stabilized & discharged on the medications as listed on his discharge medication list below. Besides the mood stabilization treatments, Anh was also enrolled & participated in the group counseling sessions being offered & held on this unit. He learned coping skills. He presented no other significant pre-existing medical issues that required treatment. He tolerated his treatment regimen without any adverse effects or reactions reported.   During the course of his hospitalization,  the 15-minute checks were adequate to ensure patient's safety. Renaud did not display any dangerous, violent or suicidal behavior on the unit.  He interacted with patients & staff appropriately, participated appropriately in the group sessions/therapies. His medications were addressed & adjusted to meet his needs. He was recommended for outpatient follow-up care & medication management upon discharge to assure continuity of care & mood stability.  At the time of discharge patient is not reporting any acute suicidal/homicidal ideations. He feels more confident about his self-care & in managing his mental health. He currently denies any new issues or concerns. Education and supportive counseling provided throughout his hospital stay & upon discharge.   Today upon his discharge evaluation with the attending psychiatrist, Tison shares he is doing well. He denies any other specific concerns. He is sleeping well. His appetite is good. He denies other physical complaints. He denies AH/VH, delusional thoughts or paranoia. He does not appear to be responding to any internal stimuli. He feels that his medications have been helpful & is in agreement to continue his current treatment regimen as recommended. He was able to engage in  safety planning including plan to return to [place] or contact emergency services if he feels unable to maintain his own safety or the safety of others. Pt had no further questions, comments, or concerns. He left [facility] with all personal belongings in no apparent distress. Transportation per [employer] to the [employer].   Physical Findings: AIMS: Facial and Oral Movements Muscles of Facial Expression: None, normal Lips and Perioral Area: None, normal Jaw: None, normal Tongue: None, normal,Extremity Movements Upper (arms, wrists, hands, fingers): None, normal Lower (legs, knees, ankles, toes): None, normal, Trunk Movements Neck, shoulders, hips: None, normal, Overall Severity Severity of abnormal movements (highest score from questions above): None, normal Incapacitation due to abnormal movements: None, normal Patient's awareness of abnormal movements (rate only patient's report): No Awareness, Dental Status Current problems with teeth and/or dentures?: No Does patient usually wear dentures?: No  CIWA:  CIWA-Ar Total: 0 COWS:     Musculoskeletal: Strength & Muscle Tone: within normal limits Gait & Station: normal Patient leans: N/A  Psychiatric Specialty Exam:  Presentation  General Appearance: Disheveled  Eye Contact:Good  Speech:Clear and Coherent; Normal Rate  Speech Volume:Normal  Handedness:Right  Mood and Affect  Mood:Depressed  Affect:Congruent; Depressed  Thought Process  Thought Processes:Coherent  Descriptions of Associations:Intact  Orientation:Full (Time, Place and Person)  Thought Content:Logical  History of Schizophrenia/Schizoaffective disorder:No  Duration of Psychotic Symptoms:No data recorded Hallucinations:Hallucinations: None  Ideas of Reference:None  Suicidal Thoughts:Suicidal Thoughts: Yes, Passive SI Active Intent and/or Plan: Without Intent; With Plan; With Means to Carry Out  Homicidal Thoughts:Homicidal Thoughts: No  Sensorium   Memory:Immediate Good; Recent Good; Remote Good  Judgment:Fair  Insight:Good  Executive Functions  Concentration:Good  Attention Span:Good  Recall:Good  Fund of Knowledge:Good  Language:Good  Psychomotor Activity  Psychomotor Activity:Psychomotor Activity: Normal  Assets  Assets:Communication Skills; Desire for Improvement; Leisure Time; Physical Health; Financial Resources/Insurance  Sleep  Sleep:Sleep: Fair  Physical Exam: Physical Exam Vitals and nursing note reviewed.  Constitutional:      Appearance: Normal appearance.  HENT:     Head: Normocephalic.  Pulmonary:     Effort: Pulmonary effort is normal.  Musculoskeletal:        General: Normal range of motion.     Cervical back: Normal range of motion.  Neurological:     Mental Status: He is alert and oriented to person, place, and time.  Psychiatric:        Attention and Perception: Attention normal. He does not perceive auditory or visual hallucinations.        Mood and Affect: Mood normal.        Speech: Speech normal.        Behavior: Behavior normal. Behavior is cooperative.        Thought Content: Thought content normal. Thought content is not paranoid or delusional. Thought content does not include homicidal or suicidal ideation. Thought content does not include homicidal or suicidal plan.        Cognition and Memory: Cognition normal.    Review of Systems  Constitutional: Negative.  Negative for fever.  HENT: Negative.  Negative for congestion and sore throat.   Respiratory: Negative for cough, shortness of breath and wheezing.   Cardiovascular: Negative.  Negative for chest pain.  Gastrointestinal: Negative.   Genitourinary: Negative.   Musculoskeletal: Negative.   Neurological: Negative.    Blood pressure 139/75, pulse 61, temperature 97.7 F (36.5 C), temperature source Oral, resp. rate 16, height 6\' 3"  (1.905 m), weight 70.8 kg, SpO2 97 %. Body mass index is 19.5 kg/m.   Have you used  any form of tobacco in the last 30 days? (Cigarettes, Smokeless Tobacco, Cigars, and/or Pipes): Yes  Has this patient used any form of tobacco in the last 30 days? (Cigarettes, Smokeless Tobacco, Cigars, and/or Pipes) Yes, N/A  Blood Alcohol level:  Lab Results  Component Value Date   ETH <10 02/27/2021   ETH <10 02/07/2021    Metabolic Disorder Labs:  Lab Results  Component Value Date   HGBA1C <4.2 (L) 03/01/2021   MPG <74 03/01/2021   No results found for: PROLACTIN Lab Results  Component Value Date   CHOL 127 03/01/2021   TRIG 89 03/01/2021   HDL 43 03/01/2021   CHOLHDL 3.0 03/01/2021   VLDL 18 03/01/2021   LDLCALC 66 03/01/2021   LDLCALC 60 06/04/2012    See Psychiatric Specialty Exam and Suicide Risk Assessment completed by Attending Physician prior to discharge.  Discharge destination:  Home  Is patient on multiple antipsychotic therapies at discharge:  No   Has Patient had three or more failed trials of antipsychotic monotherapy by history:  No  Recommended Plan for Multiple Antipsychotic Therapies: NA  Discharge Instructions    Diet - low sodium heart healthy   Complete by: As directed    Increase activity slowly   Complete by: As directed      Allergies as of 03/06/2021      Reactions   Aspirin Other (See Comments)   Increased heart rate   Tape Other (See Comments)   rash   Tramadol Hives, Itching      Medication List    STOP taking these medications   ibuprofen 800 MG tablet Commonly known as: ADVIL   oxyCODONE 15 MG immediate release tablet Commonly known as: ROXICODONE   oxyCODONE-acetaminophen 10-325 MG tablet Commonly known as: Percocet       Follow-up Information    [facility] [place] Follow up on 03/09/2021.   Why: You have a hospital follow up appointment on 03/09/21 at 1:00 pm for therapy and medication management services.  This appointment will be held in person.  * YOU MUST CALL TO CONFIRM TO KEEP THIS APPT. Contact  information: [address] [place] [address] [phone]               Follow-up recommendations:  Activity:  as tolerated Diet:  Heart healthy  Comments: Prescriptions given at discharge.  Patient agreeable to plan.  Patient was given the opportunity to ask questions.  He appears to feel comfortable with discharge denies any current suicidal or homicidal thoughts.   Patient is instructed prior to discharge to: Take all medications as prescribed by his mental healthcare provider. Report any adverse effects and or reactions from the medicines to his outpatient provider promptly. Patient has been instructed & cautioned: To not engage in alcohol and or illegal drug use while on prescription medicines. In the event of worsening symptoms, patient is instructed to call the crisis hotline, 911 and or go to the nearest ED for appropriate evaluation and treatment of symptoms. To follow-up with his primary care provider for your other medical issues, concerns and or health care needs.   Signed:  Laveda Abbe, NP 03/18/2021, 4:39 PM

## 2021-03-21 ENCOUNTER — Other Ambulatory Visit (HOSPITAL_COMMUNITY): Payer: Self-pay

## 2021-03-27 ENCOUNTER — Encounter (HOSPITAL_COMMUNITY): Payer: Self-pay | Admitting: *Deleted

## 2021-03-27 ENCOUNTER — Other Ambulatory Visit: Payer: Self-pay

## 2021-03-27 ENCOUNTER — Emergency Department (HOSPITAL_COMMUNITY)
Admission: EM | Admit: 2021-03-27 | Discharge: 2021-03-28 | Disposition: A | Discharge status: Home / Self Care | Payer: Medicare Other | Attending: Emergency Medicine | Admitting: Emergency Medicine

## 2021-03-27 ENCOUNTER — Emergency Department (HOSPITAL_COMMUNITY): Payer: Medicare Other

## 2021-03-27 DIAGNOSIS — R45851 Suicidal ideations: Secondary | ICD-10-CM | POA: Insufficient documentation

## 2021-03-27 DIAGNOSIS — F17290 Nicotine dependence, other tobacco product, uncomplicated: Secondary | ICD-10-CM | POA: Diagnosis not present

## 2021-03-27 DIAGNOSIS — I509 Heart failure, unspecified: Secondary | ICD-10-CM | POA: Diagnosis not present

## 2021-03-27 DIAGNOSIS — Z20822 Contact with and (suspected) exposure to covid-19: Secondary | ICD-10-CM | POA: Insufficient documentation

## 2021-03-27 DIAGNOSIS — F332 Major depressive disorder, recurrent severe without psychotic features: Secondary | ICD-10-CM | POA: Diagnosis not present

## 2021-03-27 DIAGNOSIS — Z5900 Homelessness unspecified: Secondary | ICD-10-CM | POA: Insufficient documentation

## 2021-03-27 DIAGNOSIS — G8929 Other chronic pain: Secondary | ICD-10-CM | POA: Diagnosis not present

## 2021-03-27 DIAGNOSIS — F1414 Cocaine abuse with cocaine-induced mood disorder: Secondary | ICD-10-CM | POA: Diagnosis not present

## 2021-03-27 DIAGNOSIS — R0789 Other chest pain: Secondary | ICD-10-CM

## 2021-03-27 DIAGNOSIS — F1410 Cocaine abuse, uncomplicated: Secondary | ICD-10-CM | POA: Diagnosis not present

## 2021-03-27 DIAGNOSIS — R4182 Altered mental status, unspecified: Secondary | ICD-10-CM | POA: Insufficient documentation

## 2021-03-27 LAB — DIFFERENTIAL
Abs Immature Granulocytes: 0.02 10*3/uL (ref 0.00–0.07)
Basophils Absolute: 0 10*3/uL (ref 0.0–0.1)
Basophils Relative: 0 %
Eosinophils Absolute: 0.1 10*3/uL (ref 0.0–0.5)
Eosinophils Relative: 2 %
Immature Granulocytes: 0 %
Lymphocytes Relative: 50 %
Lymphs Abs: 3 10*3/uL (ref 0.7–4.0)
Monocytes Absolute: 1 10*3/uL (ref 0.1–1.0)
Monocytes Relative: 16 %
Neutro Abs: 2 10*3/uL (ref 1.7–7.7)
Neutrophils Relative %: 32 %

## 2021-03-27 LAB — BASIC METABOLIC PANEL
Anion gap: 5 (ref 5–15)
BUN: 11 mg/dL (ref 6–20)
CO2: 25 mmol/L (ref 22–32)
Calcium: 9.2 mg/dL (ref 8.9–10.3)
Chloride: 108 mmol/L (ref 98–111)
Creatinine, Ser: 1.1 mg/dL (ref 0.61–1.24)
GFR, Estimated: >60 mL/min (ref >60)
Glucose, Bld: 105 mg/dL — ABNORMAL HIGH (ref 70–99)
Potassium: 4.8 mmol/L (ref 3.5–5.1)
Sodium: 138 mmol/L (ref 135–145)

## 2021-03-27 LAB — SALICYLATE LEVEL: Salicylate Lvl: <7 mg/dL — ABNORMAL LOW (ref 7.0–30.0)

## 2021-03-27 LAB — CBC
HCT: 26.2 % — ABNORMAL LOW (ref 39.0–52.0)
Hemoglobin: 8.4 g/dL — ABNORMAL LOW (ref 13.0–17.0)
MCH: 31 pg (ref 26.0–34.0)
MCHC: 32.1 g/dL (ref 30.0–36.0)
MCV: 96.7 fL (ref 80.0–100.0)
Platelets: 215 10*3/uL (ref 150–400)
RBC: 2.71 MIL/uL — ABNORMAL LOW (ref 4.22–5.81)
RDW: 21.1 % — ABNORMAL HIGH (ref 11.5–15.5)
WBC: 6 10*3/uL (ref 4.0–10.5)
nRBC: 14.5 % — ABNORMAL HIGH (ref 0.0–0.2)

## 2021-03-27 LAB — ETHANOL: Alcohol, Ethyl (B): <10 mg/dL (ref <10)

## 2021-03-27 LAB — ACETAMINOPHEN LEVEL: Acetaminophen (Tylenol), Serum: <10 ug/mL — ABNORMAL LOW (ref 10–30)

## 2021-03-27 LAB — TROPONIN I (HIGH SENSITIVITY)
Troponin I (High Sensitivity): 9 ng/L (ref <18)
Troponin I (High Sensitivity): 8 ng/L (ref <18)

## 2021-03-27 NOTE — ED Triage Notes (Signed)
Pt reports that he was going to [facility] due to SI. Then had onset of chest pain and palpitations. Reports drug use yesterday. No acute distress is noted at triage.

## 2021-03-27 NOTE — ED Provider Notes (Signed)
Emergency Medicine Provider Triage Evaluation Note Eashan Schipani , a 55 y.o. male  was evaluated in triage.  Pt complains of  Cp, back pain, si Last used cocaine yesterday Review of Systems  Positive: cp Negative: fever  Physical Exam  BP 130/69 (BP Location: Left Arm)   Pulse (!) 57   Temp 98.8 F (37.1 C) (Oral)   Resp 16   SpO2 100%  Gen:   Awake, no distress   Resp:  Normal effort  MSK:   Moves extremities without difficulty  Other:    Medical Decision Making  Medically screening exam initiated at 5:59 PM.  Appropriate orders placed.  Demetric Dunnaway was informed that the remainder of the evaluation will be completed by another provider, this initial triage assessment does not replace that evaluation, and the importance of remaining in the ED until their evaluation is complete.  Cp, si, work up initiated   Arthor Captain, PA-C 03/27/21 1801    Maia Plan, MD 03/27/21 2015

## 2021-03-27 NOTE — ED Provider Notes (Signed)
[facility] EMERGENCY DEPARTMENT Provider Note   CSN: [id number] Arrival date & time: 03/27/21  1720     History Chief Complaint  Patient presents with  . Chest Pain  . Medical Clearance    Scott Norton is a 55 y.o. male.  55 year old male with prior medical history as detailed below presents for evaluation.  Patient reports that he was on the way to be helped to discuss his feelings of suicidality.  He developed vague chest discomfort.  He describes 2 episodes of sharp pain.  Each episode lasted roughly 2 to 3 seconds.  He is comfortable now.  He decided to come to the ED for evaluation of this chest pain.  He is requesting psych evaluation after completion of evaluation of his chest pain.  Patient reports nonspecific thoughts of suicide.  He denies active plan.  He reports that he has been feeling more " down" recently.  The history is provided by the patient and medical records.  Chest Pain Pain location:  L chest Pain quality: sharp   Pain radiates to:  Does not radiate Pain severity:  No pain Onset quality:  Sudden Duration: 5 seconds. Timing:  Rare Progression:  Resolved Chronicity:  New Relieved by:  Nothing Worsened by:  Nothing      Past Medical History:  Diagnosis Date  . AKI (acute kidney injury) (HCC) 02/08/2021  . Anemia   . Depression   . Heart murmur   . Seasonal allergies    wool & grass  . Sickle cell anemia [facility])     Patient Active Problem List   Diagnosis Date Noted  . Acute exacerbation of CHF (congestive heart failure) (HCC) 03/13/2021  . Depression 03/01/2021  . Cocaine abuse with cocaine-induced mood disorder (HCC) 03/01/2021  . Sickle-cell crisis (HCC) 02/08/2021  . Hyperkalemia 02/08/2021  . Thrombocytopenia (HCC) 02/08/2021  . Leukocytosis 02/08/2021  . AKI (acute kidney injury) (HCC) 02/08/2021  . Cocaine abuse (HCC) 02/08/2021  . Homelessness 09/14/2020  . Suicidal ideations 09/04/2020  . Tobacco use 09/04/2020   . Sickle cell pain crisis (HCC) 04/11/2015  . Vitamin D deficiency 01/31/2015  . Chronic pain 06/30/2014  . Respiratory infection 08/13/2012  . Healthcare maintenance 06/04/2012  . Cervical strain, acute 12/09/2011  . Neck sprain and strain 12/09/2011  . Sickle cell anemia (HCC) 11/29/2011  . Seasonal allergies 11/29/2011  . Seasonal allergic rhinitis 11/29/2011    Past Surgical History:  Procedure Laterality Date  . NO PAST SURGERIES    . ROOT CANAL         Family History  Problem Relation Age of Onset  . Alcohol abuse Mother   . Sickle cell trait Mother   . Hypertension Mother   . Alcohol abuse Father   . Sickle cell trait Father   . Early death Father        due to alcoholism  . Hypertension Father   . Hypertension Sister   . Hypertension Brother   . Hypertension Maternal Grandmother   . Hypertension Maternal Grandfather   . Hypertension Paternal Grandmother   . Hypertension Paternal Grandfather     Social History   Tobacco Use  . Smoking status: Current Every Day Smoker    Packs/day: 0.50    Types: Cigars  . Smokeless tobacco: Never Used  Vaping Use  . Vaping Use: Never used  Substance Use Topics  . Alcohol use: Yes    Comment: occasional but indicated etoh abuse on health hx questionaire  .  Drug use: Yes    Types: Marijuana, Cocaine    Home Medications Prior to Admission medications   Medication Sig Start Date End Date Taking? Authorizing Provider  escitalopram (LEXAPRO) 5 MG tablet Take 1 tablet (5 mg total) by mouth daily. 03/18/21   Ardis Hughs, NP  ondansetron (ZOFRAN ODT) 4 MG disintegrating tablet Dissolve 1 tablet (4 mg total) by mouth every 8 (eight) hours as needed for nausea or vomiting. 03/12/21   Jacalyn Lefevre, MD  Oxycodone HCl 10 MG TABS Take 1 tablet (10 mg total) by mouth 3 (three) times daily as needed (pain). 03/16/21   Rometta Emery, MD    Allergies    Aspirin, Tape, and Tramadol  Review of Systems   Review of  Systems  Cardiovascular: Positive for chest pain.    Physical Exam Updated Vital Signs BP 128/74   Pulse 60   Temp 98.8 F (37.1 C) (Oral)   Resp 16   SpO2 95%   Physical Exam Vitals and nursing note reviewed.  Constitutional:      General: He is not in acute distress.    Appearance: He is well-developed.  HENT:     Head: Normocephalic and atraumatic.  Eyes:     Conjunctiva/sclera: Conjunctivae normal.     Pupils: Pupils are equal, round, and reactive to light.  Cardiovascular:     Rate and Rhythm: Normal rate and regular rhythm.     Heart sounds: Normal heart sounds.  Pulmonary:     Effort: Pulmonary effort is normal. No respiratory distress.     Breath sounds: Normal breath sounds.  Abdominal:     General: There is no distension.     Palpations: Abdomen is soft.     Tenderness: There is no abdominal tenderness.  Musculoskeletal:        General: No deformity. Normal range of motion.     Cervical back: Normal range of motion and neck supple.  Skin:    General: Skin is warm and dry.  Neurological:     Mental Status: He is alert and oriented to person, place, and time.  Psychiatric:     Comments: Endorses SI without plan      ED Results / Procedures / Treatments   Labs (all labs ordered are listed, but only abnormal results are displayed) Labs Reviewed  BASIC METABOLIC PANEL - Abnormal; Notable for the following components:      Result Value   Glucose, Bld 105 (*)    All other components within normal limits  CBC - Abnormal; Notable for the following components:   RBC 2.71 (*)    Hemoglobin 8.4 (*)    HCT 26.2 (*)    RDW 21.1 (*)    nRBC 14.5 (*)    All other components within normal limits  ACETAMINOPHEN LEVEL - Abnormal; Notable for the following components:   Acetaminophen (Tylenol), Serum <10 (*)    All other components within normal limits  SALICYLATE LEVEL - Abnormal; Notable for the following components:   Salicylate Lvl <7.0 (*)    All other  components within normal limits  ETHANOL  DIFFERENTIAL  RAPID URINE DRUG SCREEN, HOSP PERFORMED  RAPID URINE DRUG SCREEN, HOSP PERFORMED  PATHOLOGIST SMEAR REVIEW  TROPONIN I (HIGH SENSITIVITY)  TROPONIN I (HIGH SENSITIVITY)    EKG None  Radiology DG Chest 2 View  Result Date: 03/27/2021 CLINICAL DATA:  Chest pain and shortness of breath. EXAM: CHEST - 2 VIEW COMPARISON:  Mar 13, 2021 FINDINGS:  Mild diffusely increased interstitial lung markings are seen. Very mild atelectasis is noted within the bilateral lung bases. There is no evidence of focal consolidation, pleural effusion or pneumothorax. The cardiac silhouette is borderline in size. Marked severity calcification of the aortic arch is noted. The visualized skeletal structures are unremarkable. IMPRESSION: Very mild bibasilar atelectasis with additional findings which may represent very early interstitial edema. Electronically Signed   By: Aram Candela M.D.   On: 03/27/2021 19:16    Procedures Procedures   Medications Ordered in ED Medications - No data to display  ED Course  I have reviewed the triage vital signs and the nursing notes.  Pertinent labs & imaging results that were available during my care of the patient were reviewed by me and considered in my medical decision making (see chart for details).    MDM Rules/Calculators/A&P                          MDM  MSE complete  Mandel Seiden was evaluated in Emergency Department on 03/27/2021 for the symptoms described in the history of present illness. He was evaluated in the context of the global COVID-19 pandemic, which necessitated consideration that the patient might be at risk for infection with the SARS-CoV-2 virus that causes COVID-19. Institutional protocols and algorithms that pertain to the evaluation of patients at risk for COVID-19 are in a state of rapid change based on information released by regulatory bodies including the CDC and federal and state  organizations. These policies and algorithms were followed during the patient's care in the ED.  Patient presents with complaint of highly atypical chest discomfort.  Describe symptoms are not consistent with ACS.  ED work-up is not suggestive of ACS.  Patient is now medically cleared for further evaluation by TTS and psychiatry for reported suicidal ideation.    IFinal Clinical Impression(s) / ED Diagnoses Final diagnoses:  Suicidal ideation  Atypical chest pain    Rx / DC Orders ED Discharge Orders    None      Wynetta Fines, MD 03/27/21 2253

## 2021-03-28 ENCOUNTER — Emergency Department (HOSPITAL_COMMUNITY): Payer: Medicare Other

## 2021-03-28 ENCOUNTER — Other Ambulatory Visit: Payer: Self-pay

## 2021-03-28 ENCOUNTER — Encounter (HOSPITAL_COMMUNITY): Payer: Self-pay | Admitting: Registered Nurse

## 2021-03-28 ENCOUNTER — Encounter (HOSPITAL_COMMUNITY): Payer: Self-pay

## 2021-03-28 ENCOUNTER — Emergency Department (HOSPITAL_COMMUNITY)
Admission: EM | Admit: 2021-03-28 | Discharge: 2021-03-29 | Disposition: A | Discharge status: Other Acute Inpatient Hospital | Payer: Medicare Other | Source: Home / Self Care | Attending: Emergency Medicine | Admitting: Emergency Medicine

## 2021-03-28 DIAGNOSIS — F17290 Nicotine dependence, other tobacco product, uncomplicated: Secondary | ICD-10-CM | POA: Insufficient documentation

## 2021-03-28 DIAGNOSIS — F1414 Cocaine abuse with cocaine-induced mood disorder: Secondary | ICD-10-CM

## 2021-03-28 DIAGNOSIS — Z20822 Contact with and (suspected) exposure to covid-19: Secondary | ICD-10-CM | POA: Insufficient documentation

## 2021-03-28 DIAGNOSIS — R45851 Suicidal ideations: Secondary | ICD-10-CM

## 2021-03-28 DIAGNOSIS — F332 Major depressive disorder, recurrent severe without psychotic features: Secondary | ICD-10-CM | POA: Insufficient documentation

## 2021-03-28 DIAGNOSIS — Z5900 Homelessness unspecified: Secondary | ICD-10-CM | POA: Insufficient documentation

## 2021-03-28 DIAGNOSIS — G8929 Other chronic pain: Secondary | ICD-10-CM | POA: Insufficient documentation

## 2021-03-28 DIAGNOSIS — F1410 Cocaine abuse, uncomplicated: Secondary | ICD-10-CM

## 2021-03-28 DIAGNOSIS — M545 Low back pain, unspecified: Secondary | ICD-10-CM | POA: Insufficient documentation

## 2021-03-28 DIAGNOSIS — I509 Heart failure, unspecified: Secondary | ICD-10-CM | POA: Insufficient documentation

## 2021-03-28 LAB — COMPREHENSIVE METABOLIC PANEL
ALT: 18 U/L (ref 0–44)
AST: 33 U/L (ref 15–41)
Albumin: 4 g/dL (ref 3.5–5.0)
Alkaline Phosphatase: 168 U/L — ABNORMAL HIGH (ref 38–126)
Anion gap: 4 — ABNORMAL LOW (ref 5–15)
BUN: 14 mg/dL (ref 6–20)
CO2: 25 mmol/L (ref 22–32)
Calcium: 8.9 mg/dL (ref 8.9–10.3)
Chloride: 107 mmol/L (ref 98–111)
Creatinine, Ser: 1.19 mg/dL (ref 0.61–1.24)
GFR, Estimated: >60 mL/min (ref >60)
Glucose, Bld: 104 mg/dL — ABNORMAL HIGH (ref 70–99)
Potassium: 4.4 mmol/L (ref 3.5–5.1)
Sodium: 136 mmol/L (ref 135–145)
Total Bilirubin: 1.7 mg/dL — ABNORMAL HIGH (ref 0.3–1.2)
Total Protein: 7.2 g/dL (ref 6.5–8.1)

## 2021-03-28 LAB — CBC WITH DIFFERENTIAL/PLATELET
Abs Immature Granulocytes: 0.02 10*3/uL (ref 0.00–0.07)
Basophils Absolute: 0 10*3/uL (ref 0.0–0.1)
Basophils Relative: 0 %
Eosinophils Absolute: 0.2 10*3/uL (ref 0.0–0.5)
Eosinophils Relative: 3 %
HCT: 25.9 % — ABNORMAL LOW (ref 39.0–52.0)
Hemoglobin: 8.5 g/dL — ABNORMAL LOW (ref 13.0–17.0)
Immature Granulocytes: 0 %
Lymphocytes Relative: 36 %
Lymphs Abs: 2.5 10*3/uL (ref 0.7–4.0)
MCH: 31.3 pg (ref 26.0–34.0)
MCHC: 32.8 g/dL (ref 30.0–36.0)
MCV: 95.2 fL (ref 80.0–100.0)
Monocytes Absolute: 0.8 10*3/uL (ref 0.1–1.0)
Monocytes Relative: 12 %
Neutro Abs: 3.4 10*3/uL (ref 1.7–7.7)
Neutrophils Relative %: 49 %
Platelets: 208 10*3/uL (ref 150–400)
RBC: 2.72 MIL/uL — ABNORMAL LOW (ref 4.22–5.81)
RDW: 21.2 % — ABNORMAL HIGH (ref 11.5–15.5)
WBC: 6.9 10*3/uL (ref 4.0–10.5)
nRBC: 12.2 % — ABNORMAL HIGH (ref 0.0–0.2)

## 2021-03-28 LAB — PATHOLOGIST SMEAR REVIEW

## 2021-03-28 LAB — RAPID URINE DRUG SCREEN, HOSP PERFORMED
Amphetamines: NOT DETECTED
Barbiturates: NOT DETECTED
Benzodiazepines: NOT DETECTED
Cocaine: POSITIVE — AB
Opiates: NOT DETECTED
Tetrahydrocannabinol: NOT DETECTED

## 2021-03-28 LAB — SALICYLATE LEVEL: Salicylate Lvl: <7 mg/dL — ABNORMAL LOW (ref 7.0–30.0)

## 2021-03-28 LAB — ACETAMINOPHEN LEVEL: Acetaminophen (Tylenol), Serum: <10 ug/mL — ABNORMAL LOW (ref 10–30)

## 2021-03-28 LAB — ETHANOL: Alcohol, Ethyl (B): <10 mg/dL (ref <10)

## 2021-03-28 MED ORDER — ACETAMINOPHEN 500 MG PO TABS
1000.0000 mg | ORAL_TABLET | Freq: Four times a day (QID) | ORAL | Status: DC | PRN
  Administered 2021-03-28 (×2): 1000 mg via ORAL
  Filled 2021-03-28 (×2): qty 2

## 2021-03-28 MED ORDER — IBUPROFEN 400 MG PO TABS
400.0000 mg | ORAL_TABLET | Freq: Four times a day (QID) | ORAL | Status: DC | PRN
  Administered 2021-03-28: 400 mg via ORAL
  Filled 2021-03-28: qty 1

## 2021-03-28 MED ORDER — ESCITALOPRAM OXALATE 10 MG PO TABS: 5.0000 mg | ORAL_TABLET | Freq: Every day | ORAL | Status: DC

## 2021-03-28 NOTE — BH Assessment (Signed)
Disposition Melbourne Abts, PA, recommends overnight observation for safety and stabilization with psych reassessment in the AM. Patient will remain at [facility] due to [facility] at capacity. Trenda Moots, RN, informed of disposition.

## 2021-03-28 NOTE — Progress Notes (Signed)
CSW spoke with patient and advised him that he could not be placed in a rehab facility from the ED. CSW reviewed the inpatient rehab options with patient and the services that they provide. Patient understood and stated he would let his probation officer know. Patient stated he is wearing an ankle monitor. Patient stated he is homeless. Patient stated he is ready to go and started ripping of his hospital monitors. Patient stated he will contact the facilities and asked for a bus pass. The ED is able to educate patients and give resources but cannot hold and board patients to go to rehab.

## 2021-03-28 NOTE — Consult Note (Signed)
Telepsych Consult   Reason for Consult:  Suicidal ideation Referring Physician:  Wynetta Fines, MD Location of Patient: [facility] ED Location of Provider:  Other: [facility]    Patient Identification: Scott Norton MRN:  [id number] Principal Diagnosis: Cocaine abuse with cocaine-induced mood disorder (HCC) Diagnosis:  Principal Problem:   Cocaine abuse with cocaine-induced mood disorder (HCC) Active Problems:   Suicidal ideations   Homelessness   Cocaine abuse (HCC)   Total Time spent with patient: 30 minutes  Subjective:   Scott Norton is a 55 y.o. male patient admitted to [facility] ED when presented with complaints of chest pain and suicidal ideation  HPI:  Scott Norton, 55 y.o., male patient seen via tele health by this provider, consulted with Dr. Nelly Rout; and chart reviewed on 03/28/21.  On evaluation Scott Norton reports "I was on my way to [facility] but when I got to the bus depo I started having chest pain; a couple weeks ago I was diagnosed with heart failure.  I was hurting all over, I'm homeless, and depressed.  I don't feel like being here."  Patient states that his suicidal thoughts are only passives states Scott Norton has no plan or intent at this time; but doesn't feel safe.  States getting into a rehab program which will help his situation is the only way Scott Norton will feel safe.  Patient states Scott Norton has attempted to get into a program on his own and has had no luck. Patient states Scott Norton has been to the [facility] "I don;t even know why yall refer people to the interactive resource center, they don't help with a damn thing and the only good they are for is using a telephone, taking a shower, and giving you a locker to lock you personal things in; other than that they don't help with shit."  Patient states Scott Norton has been to the [facility] to get assistance and was referred to [facility] ([place] [facility]  They suppose to help with housing, mental health, primary care, substance  abuse, everything I would need.  Me getting help is a forcible choice of my probation.  I'm suppose to get set up with rehab and mental health services."  Patient states Scott Norton was recently discharged from [facility] "about 30 days ago.  But states Scott Norton wasn't given outpatient resources to follow up with.  "I just need to get some help."   Patient states Scott Norton is able to contract for safety if Scott Norton is able to get into a rehab program.  States Scott Norton is not willing to go to [facility] "The last time I was there the people are rude.  They make you want to hurt somebody the way they talk to you."  Patient denies prior history of suicide attempt.       During evaluation Scott Norton is elevated up in bed in no acute distress.  Scott Norton is alert, oriented x 4.  Patient was calm and cooperative throughout assessment but his mood was irritable; and made several statements like "These people in here don't know shit."  Getting upset when told that unable to adjust temperature in his room.  Scott Norton does not appear to be responding to internal/external stimuli or delusional thoughts.  Patient denies homicidal ideation, psychosis, and paranoia.  Patient does endorse passive suicidal ideation with no plan or intent but stating related to his condition (homeless, substance abuse, heart failure, sickle cell, and being on probation) getting into a rehab program  would help and Scott Norton would be safe.  Patient answered question appropriately.  Past Psychiatric History: Depression, cocaine abuse, cocaine induced mood disorder  Risk to Self:    Denies at this time stating only passive suicidal thought but only able to contract for safety if in a rehab program Risk to Others:  Denies Prior Inpatient Therapy:  Recently discharged from [facility] (30 days ago) Prior Outpatient Therapy:  Denies has any services at this time but would like to be set up with services  Past Medical History:  Past Medical History:  Diagnosis Date  . AKI (acute  kidney injury) (HCC) 02/08/2021  . Anemia   . Depression   . Heart murmur   . Seasonal allergies    wool & grass  . Sickle cell anemia (HCC)     Past Surgical History:  Procedure Laterality Date  . NO PAST SURGERIES    . ROOT CANAL     Family History:  Family History  Problem Relation Age of Onset  . Alcohol abuse Mother   . Sickle cell trait Mother   . Hypertension Mother   . Alcohol abuse Father   . Sickle cell trait Father   . Early death Father        due to alcoholism  . Hypertension Father   . Hypertension Sister   . Hypertension Brother   . Hypertension Maternal Grandmother   . Hypertension Maternal Grandfather   . Hypertension Paternal Grandmother   . Hypertension Paternal Grandfather    Family Psychiatric  History: See above Social History:  Social History   Substance and Sexual Activity  Alcohol Use Yes   Comment: occasional but indicated etoh abuse on health hx questionaire     Social History   Substance and Sexual Activity  Drug Use Yes  . Types: Marijuana, Cocaine    Social History   Socioeconomic History  . Marital status: Single    Spouse name: Not on file  . Number of children: Not on file  . Years of education: Not on file  . Highest education level: Not on file  Occupational History  . Not on file  Tobacco Use  . Smoking status: Current Every Day Smoker    Packs/day: 0.50    Types: Cigars  . Smokeless tobacco: Never Used  Vaping Use  . Vaping Use: Never used  Substance and Sexual Activity  . Alcohol use: Yes    Comment: occasional but indicated etoh abuse on health hx questionaire  . Drug use: Yes    Types: Marijuana, Cocaine  . Sexual activity: Yes  Other Topics Concern  . Not on file  Social History Narrative   Lives with Friends in [place]. Working on housing for himself   On disability for his SS disease      No children, no stable relationship   Social Determinants of Corporate investment banker Strain: Not on file  Food  Insecurity: Not on file  Transportation Needs: Not on file  Physical Activity: Not on file  Stress: Not on file  Social Connections: Not on file   Additional Social History:    Allergies:   Allergies  Allergen Reactions  . Aspirin Other (See Comments)    Increased heart rate  . Tape Other (See Comments)    rash  . Tramadol Hives and Itching    Labs:  Results for orders placed or performed during the hospital encounter of 03/27/21 (from the past 48 hour(s))  Basic metabolic panel  Status: Abnormal   Collection Time: 03/27/21  8:00 PM  Result Value Ref Range   Sodium 138 135 - 145 mmol/L   Potassium 4.8 3.5 - 5.1 mmol/L   Chloride 108 98 - 111 mmol/L   CO2 25 22 - 32 mmol/L   Glucose, Bld 105 (H) 70 - 99 mg/dL    Comment: Glucose reference range applies only to samples taken after fasting for at least 8 hours.   BUN 11 6 - 20 mg/dL   Creatinine, Ser 8.67 0.61 - 1.24 mg/dL   Calcium 9.2 8.9 - 61.9 mg/dL   GFR, Estimated >50 >93 mL/min    Comment: (NOTE) Calculated using the CKD-EPI Creatinine Equation (2021)    Anion gap 5 5 - 15    Comment: Performed at [facility], [address] [place], [place] [address]  CBC     Status: Abnormal   Collection Time: 03/27/21  8:00 PM  Result Value Ref Range   WBC 6.0 4.0 - 10.5 K/uL   RBC 2.71 (L) 4.22 - 5.81 MIL/uL   Hemoglobin 8.4 (L) 13.0 - 17.0 g/dL   HCT 45.8 (L) 09.9 - 83.3 %   MCV 96.7 80.0 - 100.0 fL   MCH 31.0 26.0 - 34.0 pg   MCHC 32.1 30.0 - 36.0 g/dL   RDW 82.5 (H) 05.3 - 97.6 %   Platelets 215 150 - 400 K/uL   nRBC 14.5 (H) 0.0 - 0.2 %    Comment: Performed at [facility], [address], [place], [place] [address]  Troponin I (High Sensitivity)     Status: None   Collection Time: 03/27/21  8:00 PM  Result Value Ref Range   Troponin I (High Sensitivity) 9 <18 ng/L    Comment: (NOTE) Elevated high sensitivity troponin I (hsTnI) values and significant  changes across serial measurements may  suggest ACS but many other  chronic and acute conditions are known to elevate hsTnI results.  Refer to the "Links" section for chest pain algorithms and additional  guidance. Performed at [facility], [address], [place], [place] [address]   Ethanol     Status: None   Collection Time: 03/27/21  8:00 PM  Result Value Ref Range   Alcohol, Ethyl (B) <10 <10 mg/dL    Comment: (NOTE) Lowest detectable limit for serum alcohol is 10 mg/dL.  For medical purposes only. Performed at [facility], [address], [place], [place] [address]   Acetaminophen level     Status: Abnormal   Collection Time: 03/27/21  8:00 PM  Result Value Ref Range   Acetaminophen (Tylenol), Serum <10 (L) 10 - 30 ug/mL    Comment: (NOTE) Therapeutic concentrations vary significantly. A range of 10-30 ug/mL  may be an effective concentration for many patients. However, some  are best treated at concentrations outside of this range. Acetaminophen concentrations >150 ug/mL at 4 hours after ingestion  and >50 ug/mL at 12 hours after ingestion are often associated with  toxic reactions.  Performed at [facility], [address] [place], [place] [address]   Salicylate level     Status: Abnormal   Collection Time: 03/27/21  8:00 PM  Result Value Ref Range   Salicylate Lvl <7.0 (L) 7.0 - 30.0 mg/dL    Comment: Performed at [facility], [address], [place], [place] [address]  Differential     Status: None   Collection Time: 03/27/21  8:00 PM  Result Value Ref Range   Neutrophils Relative % 32 %   Neutro Abs 2.0 1.7 - 7.7 K/uL   Lymphocytes Relative 50 %   Lymphs Abs 3.0 0.7 - 4.0 K/uL   Monocytes Relative 16 %   Monocytes Absolute 1.0 0.1 - 1.0 K/uL   Eosinophils Relative 2 %   Eosinophils Absolute 0.1 0.0 - 0.5 K/uL   Basophils Relative 0 %   Basophils Absolute 0.0 0.0 - 0.1 K/uL   Immature Granulocytes 0 %   Abs Immature Granulocytes 0.02 0.00 - 0.07 K/uL   Pappenheimer  Bodies PRESENT    Carollee Massed Bodies PRESENT    Sickle Cells PRESENT    Target Cells PRESENT     Comment: Performed at [facility], [address] [place], [place] [address]  Pathologist smear review     Status: None   Collection Time: 03/27/21  8:00 PM  Result Value Ref Range   Path Review      Normocytic anemia with frequent sickle cells, polychromasia, target cells, Carollee Massed bodies, and circulating nucleated RBCs.    Comment: Reviewed by Sylvie Farrier, MD, PhD 03/28/2021. Performed at [facility], [address] [place], [place] [address]   Troponin I (High Sensitivity)     Status: None   Collection Time: 03/27/21  9:12 PM  Result Value Ref Range   Troponin I (High Sensitivity) 8 <18 ng/L    Comment: (NOTE) Elevated high sensitivity troponin I (hsTnI) values and significant  changes across serial measurements may suggest ACS but many other  chronic and acute conditions are known to elevate hsTnI results.  Refer to the "Links" section for chest pain algorithms and additional  guidance. Performed at [facility], [address] [place], [place] [address]   Rapid urine drug screen (hospital performed)     Status: Abnormal   Collection Time: 03/28/21  1:37 AM  Result Value Ref Range   Opiates NONE DETECTED NONE DETECTED   Cocaine POSITIVE (A) NONE DETECTED   Benzodiazepines NONE DETECTED NONE DETECTED   Amphetamines NONE DETECTED NONE DETECTED   Tetrahydrocannabinol NONE DETECTED NONE DETECTED   Barbiturates NONE DETECTED NONE DETECTED    Comment: (NOTE) DRUG SCREEN FOR MEDICAL PURPOSES ONLY.  IF CONFIRMATION IS NEEDED FOR ANY PURPOSE, NOTIFY LAB WITHIN 5 DAYS.  LOWEST DETECTABLE LIMITS FOR URINE DRUG SCREEN Drug Class                     Cutoff (ng/mL) Amphetamine and metabolites    1000 Barbiturate and metabolites    200 Benzodiazepine                 200 Tricyclics and metabolites     300 Opiates and metabolites        300 Cocaine and  metabolites        300 THC                            50 Performed at [facility], [address], [place], [place] [address]     Current Facility-Administered Medications  Medication Dose Route Frequency Provider Last Rate Last Admin  . acetaminophen (TYLENOL) tablet 1,000 mg  1,000 mg Oral Q6H PRN Melene Plan, DO   1,000 mg at 03/28/21 1037  . ibuprofen (ADVIL) tablet 400 mg  400 mg Oral Q6H PRN Melene Plan, DO   400  mg at 03/28/21 0053   Current Outpatient Medications  Medication Sig Dispense Refill  . ondansetron (ZOFRAN ODT) 4 MG disintegrating tablet Dissolve 1 tablet (4 mg total) by mouth every 8 (eight) hours as needed for nausea or vomiting. 10 tablet 0  . Oxycodone HCl 10 MG TABS Take 1 tablet (10 mg total) by mouth 3 (three) times daily as needed (pain). 30 tablet 0  . escitalopram (LEXAPRO) 5 MG tablet Take 1 tablet (5 mg total) by mouth daily. (Patient not taking: No sig reported) 30 tablet 0    Musculoskeletal: Strength & Muscle Tone: within normal limits Gait & Station: normal Patient leans: N/A   Psychiatric Specialty Exam:  Presentation  General Appearance: Appropriate for Environment; Casual  Eye Contact:Good  Speech:Clear and Coherent; Normal Rate  Speech Volume:Normal  Handedness:Right   Mood and Affect  Mood:Irritable  Affect:Congruent   Thought Process  Thought Processes:Coherent; Goal Directed  Descriptions of Associations:Intact  Orientation:Full (Time, Place and Person)  Thought Content:WDL  History of Schizophrenia/Schizoaffective disorder:No  Duration of Psychotic Symptoms:No data recorded Hallucinations:Hallucinations: None  Ideas of Reference:None  Suicidal Thoughts:Suicidal Thoughts: Yes, Passive (Patient states Scott Norton is having thoughts that "I do't want to be here any more"   But no plan or intent) SI Passive Intent and/or Plan: Without Intent; Without Plan  Homicidal Thoughts:Homicidal Thoughts: No   Sensorium   Memory:Immediate Good; Recent Good  Judgment:Intact  Insight:Fair; Present   Executive Functions  Concentration:Good  Attention Span:Good  Recall:Good  Fund of Knowledge:Good  Language:Good   Psychomotor Activity  Psychomotor Activity:Psychomotor Activity: Normal   Assets  Assets:Communication Skills; Desire for Improvement; Leisure Time   Sleep  Sleep:Sleep: Good   Physical Exam: Physical Exam Vitals and nursing note reviewed. Exam conducted with a chaperone present.  Constitutional:      General: Scott Norton is not in acute distress.    Appearance: Normal appearance. Scott Norton is not ill-appearing.  HENT:     Head: Normocephalic.  Cardiovascular:     Rate and Rhythm: Normal rate.  Pulmonary:     Effort: Pulmonary effort is normal.  Neurological:     Mental Status: Scott Norton is alert and oriented to person, place, and time.  Psychiatric:        Attention and Perception: Attention and perception normal. Scott Norton does not perceive auditory or visual hallucinations.        Mood and Affect: Mood is depressed. Affect is angry.        Speech: Speech normal.        Behavior: Behavior is agitated. Behavior is cooperative.        Thought Content: Thought content is not paranoid or delusional. Thought content does not include homicidal ideation. Suicidal: Reporting passive suicidal thoughts.        Cognition and Memory: Cognition and memory normal.        Judgment: Judgment is impulsive.    Review of Systems  Constitutional: Negative.   HENT: Negative.   Cardiovascular: Negative for chest pain.       Reporting history of CHF  Gastrointestinal: Negative.   Genitourinary: Negative.   Musculoskeletal: Positive for back pain, myalgias and neck pain.  Skin: Negative.   Psychiatric/Behavioral: Positive for depression (Stable) and substance abuse (Cocaine). Negative for hallucinations and memory loss. Suicidal ideas: Reporting passive thoughts with no plan or intent at this time.  States gettign  into a rehab will help his situation. The patient is not nervous/anxious and does not have insomnia.  Patient reporting his stressors as being "I was recently diagnosed with congestive heart failure, I have sickle cell and in pain all the time, I am homeless, and no support.  I don't have transportation and most of these places around here don't give a damn about you and just send you on a goose chase."   Blood pressure 138/79, pulse 69, temperature 97.8 F (36.6 C), temperature source Axillary, resp. rate 18, SpO2 99 %. There is no height or weight on file to calculate BMI.  Treatment Plan Summary: Plan Referral to social work to assist with getting patient into a rehab program for substance abuse   Social work/TOC consult ordered to assist patient with rehab facility and outpatient psychiatric services.    Medication Management:  Restarted Lexapro 5 mg daily   Disposition: Monitor for safety and stabilization while social work assist with setting up with rehabilitation facility and outpatient psychiatric services.    This service was provided via telemedicine using a 2-way, interactive audio and video technology.  Names of all persons participating in this telemedicine service and their role in this encounter. Name: Assunta FoundShuvon Arek Spadafore Role: NP  Name: Dr. Nelly RoutArchana Kumar Role: Psychiatrist  Name: Scott Norton Role: Patient  Name: Wallene Dalesesmond Johnson, RN Role: Patients nurse sent a secure message informing:  Psychiatric consult complete.  Will continue to monitor patient for safety and stability while social work/TOC is working with him to get into a rehab facility which is the only place Scott Norton is willing to contract for safety.  Patient will also need outpatient psychiatric services.  [facility] related to threaten to harm staff if sent to the Urgent Care.  Please inform MD of disposition and recommendation only default provider listed.      Anum Palecek, NP 03/28/2021  12:00 PM

## 2021-03-28 NOTE — BH Assessment (Signed)
Comprehensive Clinical Assessment (CCA) Note  03/28/2021 Scott Norton [id number]   Disposition Melbourne Abts, PA, recommends overnight observation for safety and stabilization with psych reassessment in the AM. Patient will remain at [facility] due to [facility] at capacity. Trenda Moots, RN, informed of disposition.   The patient demonstrates the following risk factors for suicide: Chronic risk factors for suicide include: N/A. Acute risk factors for suicide include: family or marital conflict. Protective factors for this patient include: positive social support and coping skills. Considering these factors, the overall suicide risk at this point appears to be high. Patient is not appropriate for outpatient follow up.  Flowsheet Row ED from 03/27/2021 in [facility] EMERGENCY DEPARTMENT ED from 03/16/2021 in [facility] ED to Hosp-Admission (Discharged) from 03/13/2021 in [place] LONG 4TH FLOOR PROGRESSIVE CARE AND UROLOGY  C-SSRS RISK CATEGORY High Risk Error: Q7 should not be populated when Q6 is No Error: Q3, 4, or 5 should not be populated when Q2 is No     1:1 risk  Scott Norton is a 55 year old male presenting voluntary to [facility] due to ongoing pain and SI with no plan. Patient stated "I am at the end of my rope, I am ready to give up". Patient reported "feels like I am dying a slow death". Patient report having a lot of pain due to having sickle cell. Patient denied HI and psychosis. Patient unable to contract for safety.   TTS ASSESSMENT IN 03/17/21 Scott Norton is a 55 year old male presenting voluntary to [facility] due to SI with plan to jump in front of train or [place] [address] in front of a tractor trailer truck. Patient reported onset of SI with plan was 12 hours ago. Patient was discharged from inpatient on 03/12/21 at [facility] and on 03/13/21 patient was overnight observation. Patient reported worsening depressive symptoms. Patient he continues to be homeless and has  no support system. Patient reported main stressors include being homeless and pain related to sickle cell. Patient reports HI towards someone he loaned money to, but denies any intent or plan. Patient denies psychosis. Patient was calm and cooperative during assessment. TTS assessment note 5/262022  Chief Complaint:  Chief Complaint  Patient presents with  . Chest Pain  . Medical Clearance   Visit Diagnosis:  Major depressive disorder  CCA Biopsychosocial Intake/Chief Complaint:  SI with no plan.  Current Symptoms/Problems: Ongoing depresssion and anxiety  Patient Reported Schizophrenia/Schizoaffective Diagnosis in Past: No  Strengths: Self awareness.  Preferences: Not assessed.  Abilities: Not assessed.  Type of Services Patient Feels are Needed: Inpatient treatment.  Initial Clinical Notes/Concerns: NA  Mental Health Symptoms Depression:  Hopelessness; Sleep (too much or little); Worthlessness; Fatigue   Duration of Depressive symptoms: Less than two weeks   Mania:  None   Anxiety:   Worrying; Fatigue   Psychosis:  None   Duration of Psychotic symptoms: No data recorded  Trauma:  None   Obsessions:  None   Compulsions:  None   Inattention:  None   Hyperactivity/Impulsivity:  N/A   Oppositional/Defiant Behaviors:  None   Emotional Irregularity:  N/A   Other Mood/Personality Symptoms:  No data recorded   Mental Status Exam Appearance and self-care  Stature:  Average   Weight:  Average weight   Clothing:  -- (Pt in scrubs.)   Grooming:  Normal   Cosmetic use:  None   Posture/gait:  Normal   Motor activity:  Not Remarkable  Sensorium  Attention:  Normal   Concentration:  Normal   Orientation:  X5   Recall/memory:  Normal   Affect and Mood  Affect:  Depressed   Mood:  Depressed   Relating  Eye contact:  Normal   Facial expression:  Responsive   Attitude toward examiner:  Cooperative   Thought and Language  Speech flow:  Normal   Thought content:  Appropriate to Mood and Circumstances   Preoccupation:  None   Hallucinations:  None   Organization:  No data recorded  [employer] of Knowledge:  Fair   Intelligence:  Average   Abstraction:  Normal   Judgement:  Poor   Reality Testing:  Realistic   Insight:  Fair   Decision Making:  Normal   Social Functioning  Social Maturity:  Responsible   Social Judgement:  Normal   Stress  Stressors:  Housing; Optometrist Ability:  Exhausted   Skill Deficits:  Activities of daily living   Supports:  Support needed     Religion: Religion/Spirituality Are You A Religious Person?: No  Leisure/Recreation: Leisure / Recreation Do You Have Hobbies?: Yes Leisure and Hobbies: Watching Tv and playing video games  Exercise/Diet: Exercise/Diet Do You Exercise?: No Have You Gained or Lost A Significant Amount of Weight in the Past Six Months?: No Do You Follow a Special Diet?: No Do You Have Any Trouble Sleeping?: Yes Explanation of Sleeping Difficulties: Pt reported, his sleep was up and down.  CCA Employment/Education Employment/Work Situation:   Education:   CCA Family/Childhood History Family and Relationship History: Family history Marital status: Single Are you sexually active?: No What is your sexual orientation?: Heterosexual Has your sexual activity been affected by drugs, alcohol, medication, or emotional stress?: No Does patient have children?: Yes How many children?: 3 How is patient's relationship with their children?: Pt reports oldest daughter lives in [place] and 2 other children are in foster care  Childhood History:  Childhood History By whom was/is the patient raised?: Adoptive parents Additional childhood history information: Pt reports his father passed 30 years ago and aunt adopted Pt at the age of 33 (Pt refers to aunt as mother) Description of patient's relationship with caregiver when they were  a child: "It was great with my aunt" Patient's description of current relationship with people who raised him/her: "My aunt passed in 70 and I dont talk to my biological mother much" How were you disciplined when you got in trouble as a child/adolescent?: Spankings Does patient have siblings?: Yes Description of patient's current relationship with siblings: "We dont have much contact" Did patient suffer any verbal/emotional/physical/sexual abuse as a child?: No Did patient suffer from severe childhood neglect?: No Has patient ever been sexually abused/assaulted/raped as an adolescent or adult?: No Was the patient ever a victim of a crime or a disaster?: No Witnessed domestic violence?: No Has patient been affected by domestic violence as an adult?: No  Child/Adolescent Assessment:   CCA Substance Use Alcohol/Drug Use:   ASAM's:  Six Dimensions of Multidimensional Assessment  Dimension 1:  Acute Intoxication and/or Withdrawal Potential:      Dimension 2:  Biomedical Conditions and Complications:      Dimension 3:  Emotional, Behavioral, or Cognitive Conditions and Complications:     Dimension 4:  Readiness to Change:     Dimension 5:  Relapse, Continued use, or Continued Problem Potential:     Dimension 6:  Recovery/Living Environment:     ASAM Severity  Score:    ASAM Recommended Level of Treatment:     Substance use Disorder (SUD)   Recommendations for Services/Supports/Treatments:   DSM5 Diagnoses: Patient Active Problem List   Diagnosis Date Noted  . Acute exacerbation of CHF (congestive heart failure) (HCC) 03/13/2021  . Depression 03/01/2021  . Cocaine abuse with cocaine-induced mood disorder (HCC) 03/01/2021  . Sickle-cell crisis (HCC) 02/08/2021  . Hyperkalemia 02/08/2021  . Thrombocytopenia (HCC) 02/08/2021  . Leukocytosis 02/08/2021  . AKI (acute kidney injury) (HCC) 02/08/2021  . Cocaine abuse (HCC) 02/08/2021  . Homelessness 09/14/2020  . Suicidal ideations  09/04/2020  . Tobacco use 09/04/2020  . Sickle cell pain crisis (HCC) 04/11/2015  . Vitamin D deficiency 01/31/2015  . Chronic pain 06/30/2014  . Respiratory infection 08/13/2012  . Healthcare maintenance 06/04/2012  . Cervical strain, acute 12/09/2011  . Neck sprain and strain 12/09/2011  . Sickle cell anemia (HCC) 11/29/2011  . Seasonal allergies 11/29/2011  . Seasonal allergic rhinitis 11/29/2011   Patient Centered Plan: Patient is on the following Treatment Plan(s):    Referrals to Alternative Service(s): Referred to Alternative Service(s):   Place:   Date:   Time:    Referred to Alternative Service(s):   Place:   Date:   Time:    Referred to Alternative Service(s):   Place:   Date:   Time:    Referred to Alternative Service(s):   Place:   Date:   Time:     Burnetta Sabin, [facility]

## 2021-03-28 NOTE — ED Provider Notes (Signed)
Patient cleared by psychiatry for disposition.  Will be given outpatient resources   Lorre Nick, MD 03/28/21 1358

## 2021-03-28 NOTE — ED Notes (Signed)
Pt states he is suicidal as well due to his homelessness, probation officer and chronic back pain.

## 2021-03-28 NOTE — ED Triage Notes (Signed)
Pt c/o R upper back pain, states this is chronic and states he was diagnosed with arthritis previously. States he has been walking around all day which has exacerbated his symptoms. Also states he was having chest pain earlier today but not currently.

## 2021-03-28 NOTE — ED Provider Notes (Addendum)
Emergency Medicine Provider Triage Evaluation Note  Scott Norton 55 y.o. male was evaluated in triage.  Pt complains of lower back pain, upper back pain.  He states is been ongoing for about a month.  He states he walks around a lot and the pain is worse when he walks around.  He states he was at [facility] earlier today.  He states that he was discharged.  He is reports that earlier today, he had some chest pain.  Currently denies any chest pain.  No shortness of breath, numbness/weakness of arms or legs.   Review of Systems  Positive: Back pain, chest pain Negative: Numbness/weakness   Physical Exam  BP 134/82   Pulse 70   Temp 98.2 F (36.8 C) (Oral)   Resp 18   Ht 5\' 4"  (1.626 m)   Wt 65.8 kg   SpO2 100%   BMI 24.89 kg/m  Gen:   Awake, no distress   HEENT:  Atraumatic  Resp:  Normal effort. Lungs CTAB.  Cardiac:  Normal rate. Normal rhythm. Abd:   Nondistended, nontender  MSK:   Moves extremities without difficulty  Neuro:  Speech clear   Other:   Tenderness palpation of midline T and L-spine.  No deformity or crepitus noted.  Tenderness palpation of the right paraspinal muscles.   Medical Decision Making  Medically screening exam initiated at 10:02 PM  Appropriate orders placed.  Scott Norton was informed that the remainder of the evaluation will be completed by another provider, this initial triage assessment does not replace that evaluation. They are counseled that they will need to remain in the ED until the completion of their workup, including full H&P and results of any tests.  Risks of leaving the emergency department prior to completion of treatment were discussed. Patient was advised to inform ED staff if they are leaving before their treatment is complete. The patient acknowledged these risks and time was allowed for questions.     The patient appears stable so that the remainder of the MSE may be completed by another provider.  10:09 PM: RN infomred that patinet stated  that he was having SI. He states that his back hurting and his homelessness is making his SI worse. Plan for medical clearance labs and TTS consult.    Clinical Impression  Back pain   Portions of this note were generated with Dragon dictation software. Dictation errors may occur despite best attempts at proofreading.       Scott Cruel, PA-C 03/28/21 2204    [id number], PA-C 03/28/21 2210    05/28/21, MD 03/28/21 [phone]

## 2021-03-29 ENCOUNTER — Ambulatory Visit (HOSPITAL_COMMUNITY)
Admission: EM | Admit: 2021-03-29 | Discharge: 2021-03-29 | Disposition: A | Discharge status: Home / Self Care | Payer: Medicare Other | Attending: Psychiatry | Admitting: Psychiatry

## 2021-03-29 DIAGNOSIS — R454 Irritability and anger: Secondary | ICD-10-CM | POA: Diagnosis not present

## 2021-03-29 DIAGNOSIS — Z5900 Homelessness unspecified: Secondary | ICD-10-CM | POA: Insufficient documentation

## 2021-03-29 DIAGNOSIS — F1490 Cocaine use, unspecified, uncomplicated: Secondary | ICD-10-CM | POA: Diagnosis not present

## 2021-03-29 DIAGNOSIS — M549 Dorsalgia, unspecified: Secondary | ICD-10-CM | POA: Insufficient documentation

## 2021-03-29 DIAGNOSIS — R45851 Suicidal ideations: Secondary | ICD-10-CM | POA: Insufficient documentation

## 2021-03-29 DIAGNOSIS — F1990 Other psychoactive substance use, unspecified, uncomplicated: Secondary | ICD-10-CM | POA: Diagnosis not present

## 2021-03-29 LAB — RESP PANEL BY RT-PCR (FLU A&B, COVID) ARPGX2
Influenza A by PCR: NEGATIVE
Influenza B by PCR: NEGATIVE
SARS Coronavirus 2 by RT PCR: NEGATIVE

## 2021-03-29 MED ORDER — KETOROLAC TROMETHAMINE 30 MG/ML IJ SOLN
30.0000 mg | Freq: Once | INTRAMUSCULAR | Status: AC
  Administered 2021-03-29: 30 mg via INTRAMUSCULAR
  Filled 2021-03-29: qty 1

## 2021-03-29 NOTE — ED Notes (Signed)
Resources given upon discharge. Pt verbalized understanding of instructions reviewed on AVS by staff. Safety maintained.

## 2021-03-29 NOTE — ED Provider Notes (Signed)
[facility]-EMERGENCY DEPT Provider Note   CSN: [id number] Arrival date & time: 03/28/21  2152     History Chief Complaint  Patient presents with  . Back Pain  . Suicidal    Scott Norton is a 55 y.o. male.  Patient to ED with complaint of back pain, both lower and upper. No SOB, cough or fever. He is homeless and does a lot of walking which aggravates his chronic back pain. No new injury. He also reports he is having suicidal ideations that stem from the stress of his homelessness and chronic pain. No HI/AVH.  The history is provided by the patient. No language interpreter was used.  Back Pain Associated symptoms: no fever        Past Medical History:  Diagnosis Date  . AKI (acute kidney injury) (HCC) 02/08/2021  . Anemia   . Depression   . Heart murmur   . Seasonal allergies    wool & grass  . Sickle cell anemia [facility]     Patient Active Problem List   Diagnosis Date Noted  . Suicidal ideation   . Acute exacerbation of CHF (congestive heart failure) (HCC) 03/13/2021  . Depression 03/01/2021  . Cocaine abuse with cocaine-induced mood disorder (HCC) 03/01/2021  . Sickle-cell crisis (HCC) 02/08/2021  . Hyperkalemia 02/08/2021  . Thrombocytopenia (HCC) 02/08/2021  . Leukocytosis 02/08/2021  . AKI (acute kidney injury) (HCC) 02/08/2021  . Cocaine abuse (HCC) 02/08/2021  . Homelessness 09/14/2020  . Suicidal ideations 09/04/2020  . Tobacco use 09/04/2020  . Sickle cell pain crisis (HCC) 04/11/2015  . Vitamin D deficiency 01/31/2015  . Chronic pain 06/30/2014  . Respiratory infection 08/13/2012  . Healthcare maintenance 06/04/2012  . Cervical strain, acute 12/09/2011  . Neck sprain and strain 12/09/2011  . Sickle cell anemia (HCC) 11/29/2011  . Seasonal allergies 11/29/2011  . Seasonal allergic rhinitis 11/29/2011    Past Surgical History:  Procedure Laterality Date  . NO PAST SURGERIES    . ROOT CANAL         Family History  Problem  Relation Age of Onset  . Alcohol abuse Mother   . Sickle cell trait Mother   . Hypertension Mother   . Alcohol abuse Father   . Sickle cell trait Father   . Early death Father        due to alcoholism  . Hypertension Father   . Hypertension Sister   . Hypertension Brother   . Hypertension Maternal Grandmother   . Hypertension Maternal Grandfather   . Hypertension Paternal Grandmother   . Hypertension Paternal Grandfather     Social History   Tobacco Use  . Smoking status: Current Every Day Smoker    Packs/day: 0.50    Types: Cigars  . Smokeless tobacco: Never Used  Vaping Use  . Vaping Use: Never used  Substance Use Topics  . Alcohol use: Yes    Comment: occasional but indicated etoh abuse on health hx questionaire  . Drug use: Yes    Types: Marijuana, Cocaine    Home Medications Prior to Admission medications   Medication Sig Start Date End Date Taking? Authorizing Provider  escitalopram (LEXAPRO) 5 MG tablet Take 1 tablet (5 mg total) by mouth daily. Patient not taking: No sig reported 03/18/21   Ardis Hughs, NP  ondansetron (ZOFRAN ODT) 4 MG disintegrating tablet Dissolve 1 tablet (4 mg total) by mouth every 8 (eight) hours as needed for nausea or vomiting. 03/12/21   Jacalyn Lefevre,  MD    Allergies    Aspirin, Tape, and Tramadol  Review of Systems   Review of Systems  Constitutional: Negative for chills and fever.  HENT: Negative.   Respiratory: Negative.  Negative for shortness of breath.   Cardiovascular: Negative.   Gastrointestinal: Negative.   Musculoskeletal: Positive for back pain.  Skin: Negative.   Neurological: Negative.   Psychiatric/Behavioral: Positive for suicidal ideas.    Physical Exam Updated Vital Signs BP 134/78   Pulse (!) 58   Temp 98 F (36.7 C) (Oral)   Resp 15   Ht 6\' 3"  (1.905 m)   Wt 72.6 kg   SpO2 98%   BMI 20.00 kg/m   Physical Exam Constitutional:      Appearance: He is well-developed.  Pulmonary:      Effort: Pulmonary effort is normal.  Musculoskeletal:        General: Normal range of motion.     Cervical back: Normal range of motion.     Comments: Diffuse tenderness to palpation of upper and lower paraspinal areas. No swelling or step off.   Skin:    General: Skin is warm and dry.  Neurological:     Mental Status: He is alert and oriented to person, place, and time.  Psychiatric:        Mood and Affect: Mood normal.        Speech: Speech normal.        Behavior: Behavior is cooperative.        Thought Content: Thought content includes suicidal ideation.     ED Results / Procedures / Treatments   Labs (all labs ordered are listed, but only abnormal results are displayed) Labs Reviewed  COMPREHENSIVE METABOLIC PANEL - Abnormal; Notable for the following components:      Result Value   Glucose, Bld 104 (*)    Alkaline Phosphatase 168 (*)    Total Bilirubin 1.7 (*)    Anion gap 4 (*)    All other components within normal limits  CBC WITH DIFFERENTIAL/PLATELET - Abnormal; Notable for the following components:   RBC 2.72 (*)    Hemoglobin 8.5 (*)    HCT 25.9 (*)    RDW 21.2 (*)    nRBC 12.2 (*)    All other components within normal limits  ACETAMINOPHEN LEVEL - Abnormal; Notable for the following components:   Acetaminophen (Tylenol), Serum <10 (*)    All other components within normal limits  SALICYLATE LEVEL - Abnormal; Notable for the following components:   Salicylate Lvl <7.0 (*)    All other components within normal limits  RESP PANEL BY RT-PCR (FLU A&B, COVID) ARPGX2  ETHANOL  RAPID URINE DRUG SCREEN, HOSP PERFORMED   Results for orders placed or performed during the hospital encounter of 03/28/21  Comprehensive metabolic panel  Result Value Ref Range   Sodium 136 135 - 145 mmol/L   Potassium 4.4 3.5 - 5.1 mmol/L   Chloride 107 98 - 111 mmol/L   CO2 25 22 - 32 mmol/L   Glucose, Bld 104 (H) 70 - 99 mg/dL   BUN 14 6 - 20 mg/dL   Creatinine, Ser 05/28/21 0.61 -  1.24 mg/dL   Calcium 8.9 8.9 - 3.15 mg/dL   Total Protein 7.2 6.5 - 8.1 g/dL   Albumin 4.0 3.5 - 5.0 g/dL   AST 33 15 - 41 U/L   ALT 18 0 - 44 U/L   Alkaline Phosphatase 168 (H) 38 - 126 U/L  Total Bilirubin 1.7 (H) 0.3 - 1.2 mg/dL   GFR, Estimated >81>60 >19>60 mL/min   Anion gap 4 (L) 5 - 15  CBC with Differential  Result Value Ref Range   WBC 6.9 4.0 - 10.5 K/uL   RBC 2.72 (L) 4.22 - 5.81 MIL/uL   Hemoglobin 8.5 (L) 13.0 - 17.0 g/dL   HCT 14.725.9 (L) 82.939.0 - 56.252.0 %   MCV 95.2 80.0 - 100.0 fL   MCH 31.3 26.0 - 34.0 pg   MCHC 32.8 30.0 - 36.0 g/dL   RDW 13.021.2 (H) 86.511.5 - 78.415.5 %   Platelets 208 150 - 400 K/uL   nRBC 12.2 (H) 0.0 - 0.2 %   Neutrophils Relative % 49 %   Neutro Abs 3.4 1.7 - 7.7 K/uL   Lymphocytes Relative 36 %   Lymphs Abs 2.5 0.7 - 4.0 K/uL   Monocytes Relative 12 %   Monocytes Absolute 0.8 0.1 - 1.0 K/uL   Eosinophils Relative 3 %   Eosinophils Absolute 0.2 0.0 - 0.5 K/uL   Basophils Relative 0 %   Basophils Absolute 0.0 0.0 - 0.1 K/uL   Immature Granulocytes 0 %   Abs Immature Granulocytes 0.02 0.00 - 0.07 K/uL   Carollee MassedHowell Jolly Bodies PRESENT    Polychromasia PRESENT    Sickle Cells PRESENT    Target Cells PRESENT   Acetaminophen level  Result Value Ref Range   Acetaminophen (Tylenol), Serum <10 (L) 10 - 30 ug/mL  Ethanol  Result Value Ref Range   Alcohol, Ethyl (B) <10 <10 mg/dL  Salicylate level  Result Value Ref Range   Salicylate Lvl <7.0 (L) 7.0 - 30.0 mg/dL    EKG None  Radiology DG Chest 2 View  Result Date: 03/28/2021 CLINICAL DATA:  Sickle cell anemia, chest pain EXAM: CHEST - 2 VIEW COMPARISON:  03/27/2021 FINDINGS: The lungs are symmetrically well expanded and are clear. Bilateral perihilar interstitial thickening appears stable, likely chronic in nature. No pneumothorax or pleural effusion. Cardiac size within normal limits. The pulmonary vascularity is normal. No acute bone abnormality. IMPRESSION: Stable chronic changes. No radiographic  evidence of acute cardiopulmonary disease. Electronically Signed   By: Helyn NumbersAshesh  Parikh MD   On: 03/28/2021 23:05   DG Chest 2 View  Result Date: 03/27/2021 CLINICAL DATA:  Chest pain and shortness of breath. EXAM: CHEST - 2 VIEW COMPARISON:  Mar 13, 2021 FINDINGS: Mild diffusely increased interstitial lung markings are seen. Very mild atelectasis is noted within the bilateral lung bases. There is no evidence of focal consolidation, pleural effusion or pneumothorax. The cardiac silhouette is borderline in size. Marked severity calcification of the aortic arch is noted. The visualized skeletal structures are unremarkable. IMPRESSION: Very mild bibasilar atelectasis with additional findings which may represent very early interstitial edema. Electronically Signed   By: Aram Candelahaddeus  Houston M.D.   On: 03/27/2021 19:16   DG Lumbar Spine Complete  Result Date: 03/28/2021 CLINICAL DATA:  Back pain, lumbar spine pain EXAM: LUMBAR SPINE - COMPLETE 4+ VIEW COMPARISON:  03/12/2021 FINDINGS: Normal lumbar lordosis. No acute fracture or listhesis of the lumbar spine. Vertebral body height and intervertebral disc heights are preserved. Vascular calcifications are seen within the abdominal aorta anterior to the lumbar spine. The paraspinal soft tissues are otherwise unremarkable. IMPRESSION: Negative. Electronically Signed   By: Helyn NumbersAshesh  Parikh MD   On: 03/28/2021 23:02    Procedures Procedures   Medications Ordered in ED Medications  ketorolac (TORADOL) 30 MG/ML injection 30 mg (has no  administration in time range)    ED Course  I have reviewed the triage vital signs and the nursing notes.  Pertinent labs & imaging results that were available during my care of the patient were reviewed by me and considered in my medical decision making (see chart for details).    MDM Rules/Calculators/A&P                          Patient to ED with chronic back pain, no new injury. Imaging negative.   He also reports SI  which are recurrent.   He has bee medically cleared and has been evaluated by TTS with subsequent acceptance to [facility]. He can be transferred when COVID is resulted and is negative.   Final Clinical Impression(s) / ED Diagnoses Final diagnoses:  None   1. Suicidal ideations 2. Chronic back pain 3. Homelessness  Rx / DC Orders ED Discharge Orders    None       Elpidio Anis, PA-C 03/29/21 0444    Molpus, Jonny Ruiz, MD 03/29/21 0630

## 2021-03-29 NOTE — ED Notes (Signed)
TTS in progress 

## 2021-03-29 NOTE — ED Provider Notes (Signed)
Behavioral Health Urgent Care Medical Screening Exam  Patient Name: Scott Norton MRN: [id number] Date of Evaluation: 03/29/21 Chief Complaint:   Diagnosis:  Final diagnoses:  Substance use disorder    History of Present illness: Scott Norton is a 55 y.o. male who presented yesterday to [facility] and then later to [facility] with complaints of SI, back pain, and homelessness. He reports that he has multiple health issues that have triggered his recent SI. 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 He reports that he was given multiple resources to call yesterday after being discharged from [facility]. He reports that he called multiple numbers from about 2 PM until about 7 PM and at that point went to [facility] because he could not get any help. He reports one of his big issues appears to be his dual Medicare and Medicaid. He reports still wanting to go to Rehab for his Cocaine use. He reports no SI, HI, or AVH. He was discharged with transport to [facility] for further resources.  Psychiatric Specialty Exam  Presentation  General Appearance:Casual  Eye Contact:Good  Speech:Clear and Coherent; Normal Rate  Speech Volume:Normal  Handedness:Right   Mood and Affect  Mood:Irritable  Affect:Congruent   Thought Process  Thought Processes:Coherent; Goal Directed  Descriptions of Associations:Intact  Orientation:Full (Time, Place and Person)  Thought Content:Logical  Diagnosis of Schizophrenia or Schizoaffective disorder in past: No   Hallucinations:None  Ideas of Reference:None  Suicidal Thoughts:No Without Intent; With Plan; With Means to Carry Out Without Intent; Without Plan  Homicidal Thoughts:No Without Intent; Without Plan   Sensorium  Memory:Immediate Fair; Recent Fair  Judgment:Fair  Insight:Fair   Executive Functions  Concentration:Good  Attention  Span:Good  Recall:Good  Fund of Knowledge:Good  Language:Good   Psychomotor Activity  Psychomotor Activity:Normal   Assets  Assets:Desire for Improvement; Manufacturing systems engineer; Resilience   Sleep  Sleep:Good  Number of hours: 6.25   No data recorded  Physical Exam: Physical Exam Vitals and nursing note reviewed.  Constitutional:      Appearance: Normal appearance.  HENT:     Head: Normocephalic and atraumatic.  Cardiovascular:     Rate and Rhythm: Normal rate.  Pulmonary:     Effort: Pulmonary effort is normal.  Musculoskeletal:        General: Normal range of motion.  Neurological:     Mental Status: He is alert.    Review of Systems  Constitutional: Negative for chills.  Respiratory: Negative for cough and shortness of breath.   Cardiovascular: Negative for chest pain.  Neurological: Negative for weakness and headaches.  Psychiatric/Behavioral: Positive for depression and substance abuse. Negative for suicidal ideas.   Blood pressure 139/71, pulse 100, temperature 98.3 F (36.8 C), temperature source Oral, resp. rate 18, SpO2 100 %. There is no height or weight on file to calculate BMI.  Musculoskeletal: Strength & Muscle Tone: within normal limits Gait & Station: normal Patient leans: N/A   [facility] MSE Discharge Disposition for Follow up and Recommendations: Based on my evaluation the patient does not appear to have an emergency medical condition and can be discharged with resources and follow up care in outpatient services for Detox and Rehab. Will be provided transport to [facility] for further resources.   Lauro Franklin, MD 03/29/2021, 10:54 AM

## 2021-03-29 NOTE — ED Notes (Signed)
Let patient know about dispo to go to [facility], patient agreed to plan.

## 2021-03-29 NOTE — ED Notes (Signed)
Safe transport called for transport to [facility]. 
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
## 2021-03-29 NOTE — ED Notes (Signed)
Pt currently talking with TTS at this time

## 2021-03-29 NOTE — ED Notes (Signed)
Spoke with counselor M. Jimmye Norman who states Nira Conn, NP is recommending patient to be transferred to [facility] pending covid swab results.

## 2021-03-29 NOTE — Discharge Instructions (Addendum)
Patient is instructed to take all prescribed medications as recommended. Report any side effects or adverse reactions to your outpatient psychiatrist. Patient is instructed to abstain from alcohol and illegal drugs while on prescription medications. In the event of worsening symptoms, patient is instructed to call the crisis hotline, 911, or go to the nearest emergency department for evaluation and treatment.   

## 2021-04-27 ENCOUNTER — Emergency Department (HOSPITAL_COMMUNITY)
Admission: EM | Admit: 2021-04-27 | Discharge: 2021-04-27 | Disposition: A | Discharge status: Home / Self Care | Payer: Medicare Other | Attending: Emergency Medicine | Admitting: Emergency Medicine

## 2021-04-27 ENCOUNTER — Encounter (HOSPITAL_COMMUNITY): Payer: Self-pay

## 2021-04-27 ENCOUNTER — Other Ambulatory Visit: Payer: Self-pay

## 2021-04-27 ENCOUNTER — Emergency Department (HOSPITAL_COMMUNITY): Payer: Medicare Other

## 2021-04-27 DIAGNOSIS — M25519 Pain in unspecified shoulder: Secondary | ICD-10-CM | POA: Diagnosis not present

## 2021-04-27 DIAGNOSIS — F17210 Nicotine dependence, cigarettes, uncomplicated: Secondary | ICD-10-CM | POA: Diagnosis not present

## 2021-04-27 DIAGNOSIS — R42 Dizziness and giddiness: Secondary | ICD-10-CM | POA: Insufficient documentation

## 2021-04-27 DIAGNOSIS — M545 Low back pain, unspecified: Secondary | ICD-10-CM | POA: Diagnosis present

## 2021-04-27 LAB — CBC WITH DIFFERENTIAL/PLATELET
Abs Immature Granulocytes: 0.03 10*3/uL (ref 0.00–0.07)
Basophils Absolute: 0 10*3/uL (ref 0.0–0.1)
Basophils Relative: 0 %
Eosinophils Absolute: 0.2 10*3/uL (ref 0.0–0.5)
Eosinophils Relative: 3 %
HCT: 26.2 % — ABNORMAL LOW (ref 39.0–52.0)
Hemoglobin: 8.7 g/dL — ABNORMAL LOW (ref 13.0–17.0)
Immature Granulocytes: 0 %
Lymphocytes Relative: 37 %
Lymphs Abs: 2.5 10*3/uL (ref 0.7–4.0)
MCH: 31.9 pg (ref 26.0–34.0)
MCHC: 33.2 g/dL (ref 30.0–36.0)
MCV: 96 fL (ref 80.0–100.0)
Monocytes Absolute: 0.8 10*3/uL (ref 0.1–1.0)
Monocytes Relative: 12 %
Neutro Abs: 3.2 10*3/uL (ref 1.7–7.7)
Neutrophils Relative %: 48 %
Platelets: 172 10*3/uL (ref 150–400)
RBC: 2.73 MIL/uL — ABNORMAL LOW (ref 4.22–5.81)
RDW: 21.8 % — ABNORMAL HIGH (ref 11.5–15.5)
WBC: 6.7 10*3/uL (ref 4.0–10.5)
nRBC: 9.7 % — ABNORMAL HIGH (ref 0.0–0.2)

## 2021-04-27 LAB — BASIC METABOLIC PANEL
Anion gap: 8 (ref 5–15)
BUN: 14 mg/dL (ref 6–20)
CO2: 25 mmol/L (ref 22–32)
Calcium: 9.2 mg/dL (ref 8.9–10.3)
Chloride: 107 mmol/L (ref 98–111)
Creatinine, Ser: 0.94 mg/dL (ref 0.61–1.24)
GFR, Estimated: >60 mL/min (ref >60)
Glucose, Bld: 86 mg/dL (ref 70–99)
Potassium: 4.3 mmol/L (ref 3.5–5.1)
Sodium: 140 mmol/L (ref 135–145)

## 2021-04-27 LAB — RETICULOCYTES
Immature Retic Fract: 31 % — ABNORMAL HIGH (ref 2.3–15.9)
RBC.: 2.71 MIL/uL — ABNORMAL LOW (ref 4.22–5.81)
Retic Count, Absolute: 238 10*3/uL — ABNORMAL HIGH (ref 19.0–186.0)
Retic Ct Pct: 8.5 % — ABNORMAL HIGH (ref 0.4–3.1)

## 2021-04-27 MED ORDER — KETOROLAC TROMETHAMINE 30 MG/ML IJ SOLN
30.0000 mg | Freq: Once | INTRAMUSCULAR | Status: AC
  Administered 2021-04-27: 30 mg via INTRAMUSCULAR
  Filled 2021-04-27: qty 1

## 2021-04-27 MED ORDER — HYDROMORPHONE HCL 1 MG/ML IJ SOLN
1.0000 mg | Freq: Once | INTRAMUSCULAR | Status: AC
  Administered 2021-04-27: 1 mg via INTRAMUSCULAR
  Filled 2021-04-27: qty 1

## 2021-04-27 NOTE — ED Triage Notes (Signed)
Patient c/o bilateral shoulder pain, mid and lower back pain x several days. Patient states he has been walking a lot and felt dizzy/lightheaded when he stood up.

## 2021-04-27 NOTE — ED Provider Notes (Signed)
[facility]-EMERGENCY DEPT Provider Note   CSN: [id number] Arrival date & time: 04/27/21  1649     History Chief Complaint  Patient presents with   Back Pain   Shoulder Pain   Dizziness    Scott Norton is a 55 y.o. male.  HPI Patient presents with ongoing back pain.  Patient has a history of sickle cell anemia, has not been able to follow-up with the primary care physician in the months.  He has no home medication for use.  He notes pain in his low back, sore, persistent, worse with activity.  Transient relief with ibuprofen.  No weakness, no falling.    Past Medical History:  Diagnosis Date   AKI (acute kidney injury) (HCC) 02/08/2021   Anemia    Depression    Heart murmur    Seasonal allergies    wool & grass   Sickle cell anemia (HCC)     Patient Active Problem List   Diagnosis Date Noted   Suicidal ideation    Acute exacerbation of CHF (congestive heart failure) (HCC) 03/13/2021   Depression 03/01/2021   Cocaine abuse with cocaine-induced mood disorder (HCC) 03/01/2021   Sickle-cell crisis (HCC) 02/08/2021   Hyperkalemia 02/08/2021   Thrombocytopenia (HCC) 02/08/2021   Leukocytosis 02/08/2021   AKI (acute kidney injury) (HCC) 02/08/2021   Cocaine abuse (HCC) 02/08/2021   Homelessness 09/14/2020   Suicidal ideations 09/04/2020   Tobacco use 09/04/2020   Sickle cell pain crisis (HCC) 04/11/2015   Vitamin D deficiency 01/31/2015   Chronic pain 06/30/2014   Respiratory infection 08/13/2012   Healthcare maintenance 06/04/2012   Cervical strain, acute 12/09/2011   Neck sprain and strain 12/09/2011   Sickle cell anemia (HCC) 11/29/2011   Seasonal allergies 11/29/2011   Seasonal allergic rhinitis 11/29/2011    Past Surgical History:  Procedure Laterality Date   NO PAST SURGERIES     ROOT CANAL         Family History  Problem Relation Age of Onset   Alcohol abuse Mother    Sickle cell trait Mother    Hypertension Mother    Alcohol  abuse Father    Sickle cell trait Father    Early death Father        due to alcoholism   Hypertension Father    Hypertension Sister    Hypertension Brother    Hypertension Maternal Grandmother    Hypertension Maternal Grandfather    Hypertension Paternal Grandmother    Hypertension Paternal Grandfather     Social History   Tobacco Use   Smoking status: Every Day    Packs/day: 0.50    Pack years: 0.00    Types: Cigars, Cigarettes   Smokeless tobacco: Never  Vaping Use   Vaping Use: Never used  Substance Use Topics   Alcohol use: Yes   Drug use: Yes    Types: Marijuana, Cocaine    Home Medications Prior to Admission medications   Medication Sig Start Date End Date Taking? Authorizing Provider  escitalopram (LEXAPRO) 5 MG tablet Take 1 tablet (5 mg total) by mouth daily. Patient not taking: No sig reported 03/18/21   Ardis Hughs, NP    Allergies    Aspirin, Tape, and Tramadol  Review of Systems   Review of Systems  Constitutional:        Per HPI, otherwise negative  HENT:         Per HPI, otherwise negative  Respiratory:  Per HPI, otherwise negative  Cardiovascular:        Per HPI, otherwise negative  Gastrointestinal:  Negative for vomiting.  Endocrine:       Negative aside from HPI  Genitourinary:        Neg aside from HPI   Musculoskeletal:        Per HPI, otherwise negative  Skin: Negative.   Allergic/Immunologic: Positive for immunocompromised state.  Neurological:  Negative for syncope.   Physical Exam Updated Vital Signs BP 114/66 (BP Location: Left Arm)   Pulse (!) 55   Temp 98.4 F (36.9 C) (Oral)   Resp 16   Ht 6\' 3"  (1.905 m)   Wt 72.6 kg   SpO2 98%   BMI 20.00 kg/m   Physical Exam Vitals and nursing note reviewed.  Constitutional:      General: He is not in acute distress.    Appearance: He is well-developed.     Comments: Thin adult male awake and alert sitting upright speaking clearly in no distress  HENT:      Head: Normocephalic and atraumatic.  Eyes:     Conjunctiva/sclera: Conjunctivae normal.  Pulmonary:     Effort: Pulmonary effort is normal. No respiratory distress.     Breath sounds: No stridor.  Abdominal:     General: There is no distension.  Musculoskeletal:       Arms:  Skin:    General: Skin is warm and dry.  Neurological:     Mental Status: He is alert and oriented to person, place, and time.     Motor: Motor function is intact.     Coordination: Coordination is intact.    ED Results / Procedures / Treatments   Labs (all labs ordered are listed, but only abnormal results are displayed) Labs Reviewed  CBC WITH DIFFERENTIAL/PLATELET - Abnormal; Notable for the following components:      Result Value   RBC 2.73 (*)    Hemoglobin 8.7 (*)    HCT 26.2 (*)    RDW 21.8 (*)    nRBC 9.7 (*)    All other components within normal limits  RETICULOCYTES - Abnormal; Notable for the following components:   Retic Ct Pct 8.5 (*)    RBC. 2.71 (*)    Retic Count, Absolute 238.0 (*)    Immature Retic Fract 31.0 (*)    All other components within normal limits  BASIC METABOLIC PANEL     Radiology DG Lumbar Spine Complete  Result Date: 04/27/2021 CLINICAL DATA:  Low back pain for 4-5 days. EXAM: LUMBAR SPINE - COMPLETE 4+ VIEW COMPARISON:  Lumbar radiograph 1 month ago. This is the fourth lumbar radiograph in the last 4 months. FINDINGS: S1 appears partially lumbarized. Trace retrolisthesis of L5 on S1 is unchanged. Alignment is otherwise normal. Vertebral body heights are normal. Normal disc spaces. No fracture, bone destruction or focal bone abnormality. Sacroiliac joints are congruent. Aortic atherosclerosis. IMPRESSION: 1. No acute osseous abnormality. 2. Unchanged trace retrolisthesis of L5 on S1. Electronically Signed   By: 06/28/2021 M.D.   On: 04/27/2021 18:25    Procedures Procedures   Medications Ordered in ED Medications  ketorolac (TORADOL) 30 MG/ML injection 30 mg  (has no administration in time range)  HYDROmorphone (DILAUDID) injection 1 mg (has no administration in time range)    ED Course  I have reviewed the triage vital signs and the nursing notes.  Pertinent labs & imaging results that were available during my care of  the patient were reviewed by me and considered in my medical decision making (see chart for details).   10:07 PM On repeat exam patient is awake, alert, in no distress, listening to music on his headphones. He is aware of all findings have reviewed the x-ray, labs.  Labs consistent with multiple prior studies for him, with appropriate reticulocytes, chronic anemia.  X-ray without acute findings, consistent with report of chronic.  Given his reassuring findings here, low suspicion for decompensated sickle cell disease, no evidence for acute neuro dysfunction, no evidence for infection, patient is appropriate for initiation of analgesics here.  He was provided information on primary care follow-up with her sickle cell clinic, which I discussed with him prior to discharge.  Final Clinical Impression(s) / ED Diagnoses Final diagnoses:  Acute midline low back pain without sciatica    Rx / DC Orders ED Discharge Orders     None        Gerhard Munch, MD 04/27/21 2208

## 2021-04-27 NOTE — ED Provider Notes (Signed)
Emergency Medicine Provider Triage Evaluation Note  Scott Norton , a 55 y.o. male  was evaluated in triage.  Pt complains of back pain.  Review of Systems  Positive: Lightheadedness, low back pain, shoulder pain Negative: Fever, cold sxs, dysuria, focal weakness  Physical Exam  BP 112/71 (BP Location: Left Arm)   Pulse (!) 55   Temp 98.4 F (36.9 C) (Oral)   Resp 16   Ht 6\' 3"  (1.905 m)   Wt 72.6 kg   SpO2 96%   BMI 20.00 kg/m  Gen:   Awake, no distress   Resp:  Normal effort  MSK:   Moves extremities without difficulty  Other:    Medical Decision Making  Medically screening exam initiated at 5:28 PM.  Appropriate orders placed.  Romy Mcgue was informed that the remainder of the evaluation will be completed by another provider, this initial triage assessment does not replace that evaluation, and the importance of remaining in the ED until their evaluation is complete.  Patient with history of sickle cell disease, without home pain medication for several months due to change in his doctor who presents complaining of progressive worsening low back pain which he attributes to his arthritis as well as having some shoulder pain for the past several days but also endorsed feeling lightheadedness upon standing earlier today and was concerned that his hemoglobin may be low due to underlying sickle cell disease.  He also request for an x-ray of his low back.  No recent injury   [name], PA-C 04/27/21 1733    06/28/21, MD 04/27/21 [phone]

## 2021-04-27 NOTE — Discharge Instructions (Addendum)
As discussed, today's evaluation has been reassuring.  Please use the provided information to follow-up with our sickle cell clinic tomorrow.  They should be able to help you arrange follow-up with a new primary care physician.  Return here for concerning changes in your condition.

## 2021-05-02 ENCOUNTER — Other Ambulatory Visit (HOSPITAL_COMMUNITY): Payer: Self-pay

## 2021-05-02 ENCOUNTER — Other Ambulatory Visit: Payer: Self-pay

## 2021-05-02 ENCOUNTER — Encounter: Payer: Self-pay | Admitting: Family Medicine

## 2021-05-02 ENCOUNTER — Ambulatory Visit (INDEPENDENT_AMBULATORY_CARE_PROVIDER_SITE_OTHER): Payer: Medicare Other | Admitting: Family Medicine

## 2021-05-02 VITALS — BP 136/77 | HR 84 | Temp 98.4°F | Ht 75.0 in | Wt 162.1 lb

## 2021-05-02 DIAGNOSIS — D571 Sickle-cell disease without crisis: Secondary | ICD-10-CM

## 2021-05-02 DIAGNOSIS — R8290 Unspecified abnormal findings in urine: Secondary | ICD-10-CM | POA: Diagnosis not present

## 2021-05-02 DIAGNOSIS — E559 Vitamin D deficiency, unspecified: Secondary | ICD-10-CM | POA: Diagnosis not present

## 2021-05-02 DIAGNOSIS — G894 Chronic pain syndrome: Secondary | ICD-10-CM

## 2021-05-02 LAB — POCT URINALYSIS DIPSTICK
Bilirubin, UA: NEGATIVE
Glucose, UA: NEGATIVE
Ketones, UA: NEGATIVE
Leukocytes, UA: NEGATIVE
Nitrite, UA: NEGATIVE
Protein, UA: POSITIVE — AB
Spec Grav, UA: 1.02 (ref 1.010–1.025)
Urobilinogen, UA: 1 E.U./dL
pH, UA: 6 (ref 5.0–8.0)

## 2021-05-02 MED ORDER — FOLIC ACID 1 MG PO TABS
1.0000 mg | ORAL_TABLET | Freq: Every day | ORAL | 3 refills | Status: AC
  Filled 2021-05-02: qty 90, 90d supply, fill #0

## 2021-05-02 MED ORDER — IBUPROFEN 800 MG PO TABS
800.0000 mg | ORAL_TABLET | Freq: Three times a day (TID) | ORAL | 1 refills | Status: DC | PRN
  Filled 2021-05-02: qty 60, 20d supply, fill #0
  Filled 2021-05-27: qty 60, 20d supply, fill #1

## 2021-05-02 MED ORDER — OXYCODONE-ACETAMINOPHEN 10-325 MG PO TABS
1.0000 | ORAL_TABLET | Freq: Four times a day (QID) | ORAL | 0 refills | Status: DC | PRN
  Filled 2021-05-02: qty 60, 15d supply, fill #0

## 2021-05-02 NOTE — Patient Instructions (Signed)
[facility] [address] [place], [place]  [phone]   Sickle Cell Anemia, Adult Sickle cell anemia is a condition where your red blood cells are shaped like sickles. Red blood cells carry oxygen through the body. Sickle-shaped cells do not live as long as normal red blood cells. They also clump together and block blood from flowing through the blood vessels. This prevents the body from getting enough oxygen. Sickle cell anemia causes organ damage and pain. It also increases the risk of infection. Follow these instructions at home: Medicines Take over-the-counter and prescription medicines only as told by your doctor. If you were prescribed an antibiotic medicine, take it as told by your doctor. Do not stop taking the antibiotic even if you start to feel better. If you develop a fever, do not take medicines to lower the fever right away. Tell your doctor about the fever. Managing pain, stiffness, and swelling Try these methods to help with pain: Use a heating pad. Take a warm bath. Distract yourself, such as by watching TV. Eating and drinking Drink enough fluid to keep your pee (urine) clear or pale yellow. Drink more in hot weather and during exercise. Limit or avoid alcohol. Eat a healthy diet. Eat plenty of fruits, vegetables, whole grains, and lean protein. Take vitamins and supplements as told by your doctor. Traveling When traveling, keep these with you: Your medical information. The names of your doctors. Your medicines. If you need to take an airplane, talk to your doctor first. Activity Rest often. Avoid exercises that make your heart beat much faster, such as jogging. General instructions Do not use products that have nicotine or tobacco, such as cigarettes and e-cigarettes. If you need help quitting, ask your doctor. Consider wearing a medical alert bracelet. Avoid being in high places (high altitudes), such as mountains. Avoid very hot or  cold temperatures. Avoid places where the temperature changes a lot. Keep all follow-up visits as told by your doctor. This is important. Contact a doctor if: A joint hurts. Your feet or hands hurt or swell. You feel tired (fatigued). Get help right away if: You have symptoms of infection. These include: Fever. Chills. Being very tired. Irritability. Poor eating. Throwing up (vomiting). You feel dizzy or faint. You have new stomach pain, especially on the left side. You have a an erection (priapism) that lasts more than 4 hours. You have numbness in your arms or legs. You have a hard time moving your arms or legs. You have trouble talking. You have pain that does not go away when you take medicine. You are short of breath. You are breathing fast. You have a long-term cough. You have pain in your chest. You have a bad headache. You have a stiff neck. Your stomach looks bloated even though you did not eat much. Your skin is pale. You suddenly cannot see well. Summary Sickle cell anemia is a condition where your red blood cells are shaped like sickles. Follow your doctor's advice on ways to manage pain, food to eat, activities to do, and steps to take for safe travel. Get medical help right away if you have any signs of infection, such as a fever. This information is not intended to replace advice given to you by your health care provider. Make sure you discuss any questions you have with your health care provider. Document Revised: 03/03/2020 Document Reviewed: 03/03/2020 Elsevier Patient Education  2022 Elsevier Inc.  

## 2021-05-02 NOTE — Progress Notes (Signed)
Patient [facility]   Established Patient Office Visit  Subjective:  Patient ID: Scott Norton, male    DOB: [birth year]  Age: 55 y.o. MRN: [id number]  CC:  Chief Complaint  Patient presents with   Follow-up    Has not had any pain medications, stated he is currently in pain rates 7.5/10    HPI Scott Norton is a 55 year old male with a medical history significant for sickle cell disease, chronic pain syndrome, history of polysubstance abuse, and history of anemia of chronic disease that presents for follow-up of chronic conditions.  Patient has been lost to follow-up over the past several months.  He says that he has been out of pain medication and had a recent sickle cell pain crisis.  Patient was evaluated in the emergency department on 04/27/2021 for ongoing low back pain that was unrelieved by Tylenol.  He says that he was told that he had some degree of arthritis in his low back.  He characterizes current back pain as constant and aching.  He states that pain is working with prolonged ambulation, and he has had to do a lot of walking lately due to transportation constraints.  Pain intensity is currently 6/10.  Patient is not on any disease modifying agents at this time.  He says that he is refused hydroxyurea in the past due to its side effects.  Patient is not taking folic acid daily.  He denies blurry vision, headache, urinary symptoms, nausea, vomiting, or diarrhea.  Patient is has not up-to-date with vaccinations, he refuses COVID-19 vaccinations.  Past Medical History:  Diagnosis Date   AKI (acute kidney injury) ([place]) 02/08/2021   Anemia    Depression    Heart murmur    Seasonal allergies    wool & grass   Sickle cell anemia (HCC)     Past Surgical History:  Procedure Laterality Date   NO PAST SURGERIES     ROOT CANAL      Family History  Problem Relation Age of Onset   Alcohol abuse Mother    Sickle cell trait Mother    Hypertension  Mother    Alcohol abuse Father    Sickle cell trait Father    Early death Father        due to alcoholism   Hypertension Father    Hypertension Sister    Hypertension Brother    Hypertension Maternal Grandmother    Hypertension Maternal Grandfather    Hypertension Paternal Grandmother    Hypertension Paternal Grandfather     Social History   Socioeconomic History   Marital status: Single    Spouse name: Not on file   Number of children: Not on file   Years of education: Not on file   Highest education level: Not on file  Occupational History   Not on file  Tobacco Use   Smoking status: Some Days    Packs/day: 0.50    Pack years: 0.00    Types: Cigars, Cigarettes   Smokeless tobacco: Never  Vaping Use   Vaping Use: Never used  Substance and Sexual Activity   Alcohol use: Yes   Drug use: Yes    Types: Marijuana, Cocaine   Sexual activity: Yes  Other Topics Concern   Not on file  Social History Narrative   Lives with Friends in [place]. Working on housing for himself   On disability for his SS disease      No children, no stable  relationship   Social Determinants of Radio broadcast assistant Strain: Not on file  Food Insecurity: Not on file  Transportation Needs: Not on file  Physical Activity: Not on file  Stress: Not on file  Social Connections: Not on file  Intimate Partner Violence: Not on file    Outpatient Medications Prior to Visit  Medication Sig Dispense Refill   escitalopram (LEXAPRO) 5 MG tablet Take 1 tablet (5 mg total) by mouth daily. (Patient not taking: No sig reported) 30 tablet 0   oxyCODONE-acetaminophen (PERCOCET) 7.5-325 MG tablet Take 1 tablet by mouth 3 (three) times daily as needed. (Patient not taking: Reported on 05/02/2021)     No facility-administered medications prior to visit.    Allergies  Allergen Reactions   Aspirin Other (See Comments)    Increased heart rate   Tape Other (See Comments)    rash   Tramadol Hives and  Itching    ROS Review of Systems  Constitutional: Negative.   HENT: Negative.    Eyes: Negative.   Respiratory: Negative.    Cardiovascular: Negative.   Gastrointestinal: Negative.   Endocrine: Negative.   Genitourinary: Negative.   Musculoskeletal:  Positive for arthralgias and back pain.  Skin: Negative.   Neurological: Negative.   Hematological: Negative.      Objective:    Physical Exam Constitutional:      Appearance: Normal appearance.  Eyes:     Pupils: Pupils are equal, round, and reactive to light.  Cardiovascular:     Rate and Rhythm: Normal rate and regular rhythm.     Pulses: Normal pulses.  Abdominal:     General: Bowel sounds are normal.  Skin:    General: Skin is warm.  Neurological:     General: No focal deficit present.     Mental Status: He is alert. Mental status is at baseline.  Psychiatric:        Mood and Affect: Mood normal.        Behavior: Behavior normal.        Thought Content: Thought content normal.        Judgment: Judgment normal.    BP 136/77 (BP Location: Right Arm, Patient Position: Sitting)   Pulse 84   Temp 98.4 F (36.9 C)   Ht 6' 3" (1.905 m)   Wt 162 lb 0.8 oz (73.5 kg)   SpO2 97%   BMI 20.25 kg/m  Wt Readings from Last 3 Encounters:  05/02/21 162 lb 0.8 oz (73.5 kg)  04/27/21 160 lb (72.6 kg)  03/28/21 160 lb (72.6 kg)     Health Maintenance Due  Topic Date Due   COVID-19 Vaccine (1) Never done   Pneumococcal Vaccine 32-42 Years old (1 - PCV) Never done   Zoster Vaccines- Shingrix (1 of 2) Never done    There are no preventive care reminders to display for this patient.  Lab Results  Component Value Date   TSH 1.730 03/01/2021   Lab Results  Component Value Date   WBC 6.7 04/27/2021   HGB 8.7 (L) 04/27/2021   HCT 26.2 (L) 04/27/2021   MCV 96.0 04/27/2021   PLT 172 04/27/2021   Lab Results  Component Value Date   NA 140 04/27/2021   K 4.3 04/27/2021   CO2 25 04/27/2021   GLUCOSE 86 04/27/2021    BUN 14 04/27/2021   CREATININE 0.94 04/27/2021   BILITOT 1.7 (H) 03/28/2021   ALKPHOS 168 (H) 03/28/2021   AST 33 03/28/2021  ALT 18 03/28/2021   PROT 7.2 03/28/2021   ALBUMIN 4.0 03/28/2021   CALCIUM 9.2 04/27/2021   ANIONGAP 8 04/27/2021   EGFR 56 (L) 12/30/2020   Lab Results  Component Value Date   CHOL 127 03/01/2021   Lab Results  Component Value Date   HDL 43 03/01/2021   Lab Results  Component Value Date   LDLCALC 66 03/01/2021   Lab Results  Component Value Date   TRIG 89 03/01/2021   Lab Results  Component Value Date   CHOLHDL 3.0 03/01/2021   Lab Results  Component Value Date   HGBA1C <4.2 (L) 03/01/2021      Assessment & Plan:   Problem List Items Addressed This Visit       Other   Vitamin D deficiency   Relevant Orders   Sickle Cell Panel   Sickle cell anemia ([place]) - Primary   Relevant Medications   folic acid (FOLVITE) 1 MG tablet   oxyCODONE-acetaminophen (PERCOCET) 10-325 MG tablet   Other Relevant Orders   Urinalysis Dipstick (Completed)   Sickle Cell Panel   Chronic pain   Relevant Medications   oxyCODONE-acetaminophen (PERCOCET) 10-325 MG tablet   ibuprofen (ADVIL) 800 MG tablet   Other Relevant Orders   [id number] 11+Oxyco+Alc+Crt-Bund   Other Visit Diagnoses     Abnormal urinalysis       Relevant Orders   Urine Culture      1. Sickle cell disease without crisis [facility]) The current medical regimen is effective;  continue present plan and medications.  - Urinalysis Dipstick - Sickle Cell Panel - folic acid (FOLVITE) 1 MG tablet; Take 1 tablet (1 mg total) by mouth daily.  Dispense: 90 tablet; Refill: 3 - oxyCODONE-acetaminophen (PERCOCET) 10-325 MG tablet; Take 1 tablet by mouth every 6 (six) hours as needed for pain.  Dispense: 60 tablet; Refill: 0  2. Vitamin D deficiency  - Sickle Cell Panel  3. Chronic pain syndrome Controlled substance agreement signed today, reviewed and discussed at length. Reviewed PDMP substance  reporting system prior to prescribing opiate medications. No inconsistencies noted.   - oxyCODONE-acetaminophen (PERCOCET) 10-325 MG tablet; Take 1 tablet by mouth every 6 (six) hours as needed for pain.  Dispense: 60 tablet; Refill: 0 - [id number] 11+Oxyco+Alc+Crt-Bund  4. Abnormal urinalysis  - Urine Culture  No orders of the defined types were placed in this encounter.   Follow-up: Return in about 3 months (around 08/02/2021) for sickle cell anemia.    Donia Pounds  APRN, MSN, FNP-C [facility] [address] [place], [place] [address] [phone]

## 2021-05-03 LAB — CMP14+CBC/D/PLT+FER+RETIC+V...
ALT: 17 IU/L (ref 0–44)
AST: 36 IU/L (ref 0–40)
Albumin/Globulin Ratio: 1.7 (ref 1.2–2.2)
Albumin: 4.2 g/dL (ref 3.8–4.9)
Alkaline Phosphatase: 242 IU/L — ABNORMAL HIGH (ref 44–121)
BUN/Creatinine Ratio: 12 (ref 9–20)
BUN: 11 mg/dL (ref 6–24)
Basophils Absolute: 0 x10E3/uL (ref 0.0–0.2)
Basos: 0 %
Bilirubin Total: 1.9 mg/dL — ABNORMAL HIGH (ref 0.0–1.2)
CO2: 21 mmol/L (ref 20–29)
Calcium: 9.4 mg/dL (ref 8.7–10.2)
Chloride: 106 mmol/L (ref 96–106)
Creatinine, Ser: 0.93 mg/dL (ref 0.76–1.27)
EOS (ABSOLUTE): 0.2 x10E3/uL (ref 0.0–0.4)
Eos: 3 %
Ferritin: 336 ng/mL (ref 30–400)
Globulin, Total: 2.5 g/dL (ref 1.5–4.5)
Glucose: 82 mg/dL (ref 65–99)
Hematocrit: 23.9 % — ABNORMAL LOW (ref 37.5–51.0)
Hemoglobin: 8 g/dL — ABNORMAL LOW (ref 13.0–17.7)
Immature Grans (Abs): 0 x10E3/uL (ref 0.0–0.1)
Immature Granulocytes: 0 %
Lymphocytes Absolute: 3 x10E3/uL (ref 0.7–3.1)
Lymphs: 41 %
MCH: 31.6 pg (ref 26.6–33.0)
MCHC: 33.5 g/dL (ref 31.5–35.7)
MCV: 95 fL (ref 79–97)
Monocytes Absolute: 1 x10E3/uL — ABNORMAL HIGH (ref 0.1–0.9)
Monocytes: 13 %
NRBC: 4 % — ABNORMAL HIGH (ref 0–0)
Neutrophils Absolute: 3.2 x10E3/uL (ref 1.4–7.0)
Neutrophils: 43 %
Platelets: 184 x10E3/uL (ref 150–450)
Potassium: 4.9 mmol/L (ref 3.5–5.2)
RBC: 2.53 x10E6/uL — CL (ref 4.14–5.80)
RDW: 20 % — ABNORMAL HIGH (ref 11.6–15.4)
Retic Ct Pct: 4.6 % — ABNORMAL HIGH (ref 0.6–2.6)
Sodium: 143 mmol/L (ref 134–144)
Total Protein: 6.7 g/dL (ref 6.0–8.5)
Vit D, 25-Hydroxy: 32.8 ng/mL (ref 30.0–100.0)
WBC: 7.4 x10E3/uL (ref 3.4–10.8)
eGFR: 98 mL/min/1.73

## 2021-05-04 LAB — DRUG SCREEN 764883 11+OXYCO+ALC+CRT-BUND
Amphetamines, Urine: NEGATIVE ng/mL
BENZODIAZ UR QL: NEGATIVE ng/mL
Barbiturate: NEGATIVE ng/mL
Cannabinoid Quant, Ur: NEGATIVE ng/mL
Cocaine (Metabolite): NEGATIVE ng/mL
Creatinine: 109.5 mg/dL (ref 20.0–300.0)
Ethanol: NEGATIVE %
Meperidine: NEGATIVE ng/mL
Methadone Screen, Urine: NEGATIVE ng/mL
OPIATE SCREEN URINE: NEGATIVE ng/mL
Oxycodone/Oxymorphone, Urine: NEGATIVE ng/mL
Phencyclidine: NEGATIVE ng/mL
Propoxyphene: NEGATIVE ng/mL
Tramadol: NEGATIVE ng/mL
pH, Urine: 5.8 (ref 4.5–8.9)

## 2021-05-04 LAB — URINE CULTURE: Organism ID, Bacteria: NO GROWTH

## 2021-05-12 ENCOUNTER — Other Ambulatory Visit (HOSPITAL_COMMUNITY): Payer: Self-pay

## 2021-05-12 ENCOUNTER — Other Ambulatory Visit: Payer: Self-pay | Admitting: Family Medicine

## 2021-05-12 DIAGNOSIS — D571 Sickle-cell disease without crisis: Secondary | ICD-10-CM

## 2021-05-12 DIAGNOSIS — G894 Chronic pain syndrome: Secondary | ICD-10-CM

## 2021-05-12 MED ORDER — OXYCODONE-ACETAMINOPHEN 10-325 MG PO TABS
1.0000 | ORAL_TABLET | Freq: Four times a day (QID) | ORAL | 0 refills | Status: DC | PRN
  Filled 2021-05-16: qty 60, 15d supply, fill #0

## 2021-05-12 NOTE — Progress Notes (Signed)
Reviewed PDMP substance reporting system prior to prescribing opiate medications. No inconsistencies noted.  Meds ordered this encounter  Medications   oxyCODONE-acetaminophen (PERCOCET) 10-325 MG tablet    Sig: Take 1 tablet by mouth every 6 (six) hours as needed for pain.    Dispense:  60 tablet    Refill:  0    Order Specific Question:   Supervising Provider    Answer:   JEGEDE, OLUGBEMIGA E [[id number]]   Jaspreet Hollings Moore Tommie Bohlken  APRN, MSN, FNP-C [facility] [address]  [place],  [address] [phone]  

## 2021-05-13 ENCOUNTER — Other Ambulatory Visit (HOSPITAL_COMMUNITY): Payer: Self-pay

## 2021-05-16 ENCOUNTER — Other Ambulatory Visit (HOSPITAL_COMMUNITY): Payer: Self-pay

## 2021-05-27 ENCOUNTER — Other Ambulatory Visit: Payer: Self-pay | Admitting: Family Medicine

## 2021-05-27 DIAGNOSIS — D571 Sickle-cell disease without crisis: Secondary | ICD-10-CM

## 2021-05-27 DIAGNOSIS — G894 Chronic pain syndrome: Secondary | ICD-10-CM

## 2021-05-29 ENCOUNTER — Other Ambulatory Visit: Payer: Self-pay | Admitting: Internal Medicine

## 2021-05-29 ENCOUNTER — Other Ambulatory Visit (HOSPITAL_COMMUNITY): Payer: Self-pay

## 2021-05-29 DIAGNOSIS — G894 Chronic pain syndrome: Secondary | ICD-10-CM

## 2021-05-29 DIAGNOSIS — D571 Sickle-cell disease without crisis: Secondary | ICD-10-CM

## 2021-05-29 MED ORDER — OXYCODONE-ACETAMINOPHEN 10-325 MG PO TABS
1.0000 | ORAL_TABLET | Freq: Four times a day (QID) | ORAL | 0 refills | Status: DC | PRN
  Filled 2021-05-29: qty 60, 15d supply, fill #0

## 2021-06-09 ENCOUNTER — Emergency Department (HOSPITAL_COMMUNITY): Payer: Medicare HMO

## 2021-06-09 ENCOUNTER — Other Ambulatory Visit (HOSPITAL_COMMUNITY): Payer: Self-pay

## 2021-06-09 ENCOUNTER — Inpatient Hospital Stay (HOSPITAL_COMMUNITY)
Admission: EM | Admit: 2021-06-09 | Discharge: 2021-06-13 | DRG: 811 | Disposition: A | Discharge status: Home / Self Care | Payer: Medicare HMO | Attending: Internal Medicine | Admitting: Internal Medicine

## 2021-06-09 ENCOUNTER — Other Ambulatory Visit: Payer: Self-pay | Admitting: Internal Medicine

## 2021-06-09 ENCOUNTER — Other Ambulatory Visit: Payer: Self-pay

## 2021-06-09 ENCOUNTER — Encounter (HOSPITAL_COMMUNITY): Payer: Self-pay | Admitting: Emergency Medicine

## 2021-06-09 DIAGNOSIS — J9601 Acute respiratory failure with hypoxia: Secondary | ICD-10-CM | POA: Diagnosis present

## 2021-06-09 DIAGNOSIS — D638 Anemia in other chronic diseases classified elsewhere: Secondary | ICD-10-CM | POA: Diagnosis present

## 2021-06-09 DIAGNOSIS — I517 Cardiomegaly: Secondary | ICD-10-CM | POA: Diagnosis not present

## 2021-06-09 DIAGNOSIS — Z8249 Family history of ischemic heart disease and other diseases of the circulatory system: Secondary | ICD-10-CM

## 2021-06-09 DIAGNOSIS — G894 Chronic pain syndrome: Secondary | ICD-10-CM

## 2021-06-09 DIAGNOSIS — D5700 Hb-SS disease with crisis, unspecified: Secondary | ICD-10-CM | POA: Diagnosis present

## 2021-06-09 DIAGNOSIS — Z653 Problems related to other legal circumstances: Secondary | ICD-10-CM

## 2021-06-09 DIAGNOSIS — M549 Dorsalgia, unspecified: Secondary | ICD-10-CM | POA: Diagnosis present

## 2021-06-09 DIAGNOSIS — D5701 Hb-SS disease with acute chest syndrome: Principal | ICD-10-CM | POA: Diagnosis present

## 2021-06-09 DIAGNOSIS — F17290 Nicotine dependence, other tobacco product, uncomplicated: Secondary | ICD-10-CM | POA: Diagnosis present

## 2021-06-09 DIAGNOSIS — M2011 Hallux valgus (acquired), right foot: Secondary | ICD-10-CM | POA: Diagnosis not present

## 2021-06-09 DIAGNOSIS — D696 Thrombocytopenia, unspecified: Secondary | ICD-10-CM | POA: Diagnosis present

## 2021-06-09 DIAGNOSIS — M1990 Unspecified osteoarthritis, unspecified site: Secondary | ICD-10-CM | POA: Diagnosis present

## 2021-06-09 DIAGNOSIS — Z91048 Other nonmedicinal substance allergy status: Secondary | ICD-10-CM

## 2021-06-09 DIAGNOSIS — F32A Depression, unspecified: Secondary | ICD-10-CM | POA: Diagnosis present

## 2021-06-09 DIAGNOSIS — R0902 Hypoxemia: Secondary | ICD-10-CM | POA: Diagnosis not present

## 2021-06-09 DIAGNOSIS — E876 Hypokalemia: Secondary | ICD-10-CM | POA: Diagnosis present

## 2021-06-09 DIAGNOSIS — D571 Sickle-cell disease without crisis: Secondary | ICD-10-CM | POA: Diagnosis present

## 2021-06-09 DIAGNOSIS — Z886 Allergy status to analgesic agent status: Secondary | ICD-10-CM

## 2021-06-09 DIAGNOSIS — Z832 Family history of diseases of the blood and blood-forming organs and certain disorders involving the immune mechanism: Secondary | ICD-10-CM

## 2021-06-09 DIAGNOSIS — Z20822 Contact with and (suspected) exposure to covid-19: Secondary | ICD-10-CM | POA: Diagnosis present

## 2021-06-09 DIAGNOSIS — J9811 Atelectasis: Secondary | ICD-10-CM | POA: Diagnosis not present

## 2021-06-09 DIAGNOSIS — D649 Anemia, unspecified: Secondary | ICD-10-CM

## 2021-06-09 DIAGNOSIS — Z811 Family history of alcohol abuse and dependence: Secondary | ICD-10-CM

## 2021-06-09 DIAGNOSIS — F1120 Opioid dependence, uncomplicated: Secondary | ICD-10-CM | POA: Diagnosis present

## 2021-06-09 DIAGNOSIS — Z5900 Homelessness unspecified: Secondary | ICD-10-CM

## 2021-06-09 DIAGNOSIS — Z885 Allergy status to narcotic agent status: Secondary | ICD-10-CM

## 2021-06-09 LAB — COMPREHENSIVE METABOLIC PANEL
ALT: 16 U/L (ref 0–44)
AST: 32 U/L (ref 15–41)
Albumin: 2.7 g/dL — ABNORMAL LOW (ref 3.5–5.0)
Alkaline Phosphatase: 134 U/L — ABNORMAL HIGH (ref 38–126)
Anion gap: 3 — ABNORMAL LOW (ref 5–15)
BUN: 19 mg/dL (ref 6–20)
CO2: 19 mmol/L — ABNORMAL LOW (ref 22–32)
Calcium: 6.6 mg/dL — ABNORMAL LOW (ref 8.9–10.3)
Chloride: 120 mmol/L — ABNORMAL HIGH (ref 98–111)
Creatinine, Ser: 0.94 mg/dL (ref 0.61–1.24)
GFR, Estimated: >60 mL/min (ref >60)
Glucose, Bld: 63 mg/dL — ABNORMAL LOW (ref 70–99)
Potassium: 3.2 mmol/L — ABNORMAL LOW (ref 3.5–5.1)
Sodium: 142 mmol/L (ref 135–145)
Total Bilirubin: 1.5 mg/dL — ABNORMAL HIGH (ref 0.3–1.2)
Total Protein: 4.9 g/dL — ABNORMAL LOW (ref 6.5–8.1)

## 2021-06-09 LAB — CK: Total CK: 55 U/L (ref 49–397)

## 2021-06-09 LAB — RETICULOCYTES
Immature Retic Fract: 40.8 % — ABNORMAL HIGH (ref 2.3–15.9)
RBC.: 2 MIL/uL — ABNORMAL LOW (ref 4.22–5.81)
Retic Count, Absolute: 126.4 10*3/uL (ref 19.0–186.0)
Retic Ct Pct: 6.3 % — ABNORMAL HIGH (ref 0.4–3.1)

## 2021-06-09 MED ORDER — NAPROXEN 375 MG PO TABS
375.0000 mg | ORAL_TABLET | Freq: Once | ORAL | Status: AC
  Administered 2021-06-10: 375 mg via ORAL
  Filled 2021-06-09: qty 1

## 2021-06-09 NOTE — ED Provider Notes (Signed)
[facility]-EMERGENCY DEPT Provider Note   CSN: [id number] Arrival date & time: 06/09/21  2100     History Chief Complaint  Patient presents with   Pain    Scott Norton is a 55 y.o. male with history of sickle cell anemia who presents to the emergency department today for back and right ankle pain that began 3 days ago.  He states his back pain is localized to the thoracic and lumbar spine.  It is worse with movement and better with rest.  He does not recall injuring his back states he was diagnosed with arthritis earlier this year.  He describes it as a dull ache and it is constant.  He currently rates it severe in severity.  He denies any radiating symptoms, fever, chills, or bowel/bladder incontinence. He denies any fall or trauma to the back. He states that he has been walking more than usual as he is on probation and having to go to multiple locations for various tasks.  His right ankle pain began roughly a week ago when he felt a popping sensation while walking.  He rates his ankle pain moderate in severity and is worse while walking and better with rest. He has not taken anything for the pain.   HPI     Past Medical History:  Diagnosis Date   AKI (acute kidney injury) (HCC) 02/08/2021   Anemia    Depression    Heart murmur    Seasonal allergies    wool & grass   Sickle cell anemia (HCC)     Patient Active Problem List   Diagnosis Date Noted   Suicidal ideation    Acute exacerbation of CHF (congestive heart failure) (HCC) 03/13/2021   Depression 03/01/2021   Cocaine abuse with cocaine-induced mood disorder (HCC) 03/01/2021   Sickle-cell crisis (HCC) 02/08/2021   Hyperkalemia 02/08/2021   Thrombocytopenia (HCC) 02/08/2021   Leukocytosis 02/08/2021   AKI (acute kidney injury) (HCC) 02/08/2021   Cocaine abuse (HCC) 02/08/2021   Homelessness 09/14/2020   Suicidal ideations 09/04/2020   Tobacco use 09/04/2020   Sickle cell pain crisis (HCC)  04/11/2015   Vitamin D deficiency 01/31/2015   Chronic pain 06/30/2014   Respiratory infection 08/13/2012   Healthcare maintenance 06/04/2012   Cervical strain, acute 12/09/2011   Neck sprain and strain 12/09/2011   Sickle cell anemia (HCC) 11/29/2011   Seasonal allergies 11/29/2011   Seasonal allergic rhinitis 11/29/2011    Past Surgical History:  Procedure Laterality Date   NO PAST SURGERIES     ROOT CANAL         Family History  Problem Relation Age of Onset   Alcohol abuse Mother    Sickle cell trait Mother    Hypertension Mother    Alcohol abuse Father    Sickle cell trait Father    Early death Father        due to alcoholism   Hypertension Father    Hypertension Sister    Hypertension Brother    Hypertension Maternal Grandmother    Hypertension Maternal Grandfather    Hypertension Paternal Grandmother    Hypertension Paternal Grandfather     Social History   Tobacco Use   Smoking status: Some Days    Packs/day: 0.50    Types: Cigars, Cigarettes   Smokeless tobacco: Never  Vaping Use   Vaping Use: Never used  Substance Use Topics   Alcohol use: Not Currently   Drug use: Yes    Types: Marijuana, Cocaine  Home Medications Prior to Admission medications   Medication Sig Start Date End Date Taking? Authorizing Provider  folic acid (FOLVITE) 1 MG tablet Take 1 tablet (1 mg total) by mouth daily. 05/02/21 05/02/22  Massie Maroon, FNP  ibuprofen (ADVIL) 800 MG tablet Take 1 tablet (800 mg total) by mouth every 8 (eight) hours as needed. 05/02/21   Massie Maroon, FNP  oxyCODONE-acetaminophen (PERCOCET) 10-325 MG tablet Take 1 tablet by mouth every 6 (six) hours as needed for up to 15 days for pain. 05/29/21 06/13/21  Quentin Angst, MD    Allergies    Aspirin, Tape, and Tramadol  Review of Systems   Review of Systems  Constitutional:  Negative for chills, fatigue and fever.  Cardiovascular:  Negative for chest pain.  Musculoskeletal:   Positive for back pain and myalgias. Negative for neck pain.  All other systems reviewed and are negative.  Physical Exam Updated Vital Signs BP (!) 128/57 (BP Location: Right Arm)   Pulse (!) 56   Temp 98 F (36.7 C) (Oral)   Resp 15   Ht 6\' 3"  (1.905 m)   Wt 73.5 kg   SpO2 95%   BMI 20.25 kg/m   Physical Exam Constitutional:      Appearance: Normal appearance.  HENT:     Head: Normocephalic and atraumatic.  Eyes:     General:        Right eye: No discharge.        Left eye: No discharge.  Cardiovascular:     Rate and Rhythm: Normal rate and regular rhythm.     Pulses:          Dorsalis pedis pulses are 2+ on the right side and 2+ on the left side.       Posterior tibial pulses are 2+ on the right side and 2+ on the left side.     Heart sounds: Murmur heard.  Systolic murmur is present with a grade of 4/6.  Musculoskeletal:     Cervical back: Normal. No torticollis, tenderness or bony tenderness. No pain with movement. Normal range of motion.     Thoracic back: Normal. No bony tenderness. Normal range of motion.     Lumbar back: No bony tenderness. Normal range of motion.     Right ankle: No swelling. No tenderness. No lateral malleolus, medial malleolus or ATF ligament tenderness.     Comments: Normal cap refill in the right foot.  Right foot is neurovascularly intact distal to the ankle.  Skin:    General: Skin is warm and dry.  Neurological:     Mental Status: He is alert.  Psychiatric:        Mood and Affect: Mood and affect normal.    ED Results / Procedures / Treatments   Labs (all labs ordered are listed, but only abnormal results are displayed) Labs Reviewed  CBC WITH DIFFERENTIAL/PLATELET  COMPREHENSIVE METABOLIC PANEL  CK  RETICULOCYTES    EKG None  Radiology DG Ankle Complete Right  Result Date: 06/09/2021 CLINICAL DATA:  Right foot and ankle pain EXAM: RIGHT FOOT COMPLETE - 3+ VIEW; RIGHT ANKLE - COMPLETE 3+ VIEW COMPARISON:  None. FINDINGS:  Right ankle: Frontal, oblique, lateral views are obtained. No fracture, subluxation, or dislocation. Joint spaces are well preserved. Dystrophic soft tissue calcifications posterior to the distal tibia may reflect sequela from previous trauma. Right foot: Frontal, oblique, and lateral views are obtained. No acute displaced fracture, subluxation, or dislocation. Mild hallux valgus  deformity. Soft tissues grossly unremarkable. IMPRESSION: 1. Mild hallux valgus deformity. 2. No acute abnormality within the right foot or ankle. Electronically Signed   By: Sharlet Salina M.D.   On: 06/09/2021 22:18   DG Foot Complete Right  Result Date: 06/09/2021 CLINICAL DATA:  Right foot and ankle pain EXAM: RIGHT FOOT COMPLETE - 3+ VIEW; RIGHT ANKLE - COMPLETE 3+ VIEW COMPARISON:  None. FINDINGS: Right ankle: Frontal, oblique, lateral views are obtained. No fracture, subluxation, or dislocation. Joint spaces are well preserved. Dystrophic soft tissue calcifications posterior to the distal tibia may reflect sequela from previous trauma. Right foot: Frontal, oblique, and lateral views are obtained. No acute displaced fracture, subluxation, or dislocation. Mild hallux valgus deformity. Soft tissues grossly unremarkable. IMPRESSION: 1. Mild hallux valgus deformity. 2. No acute abnormality within the right foot or ankle. Electronically Signed   By: Sharlet Salina M.D.   On: 06/09/2021 22:18    Procedures Procedures   Medications Ordered in ED Medications  potassium chloride 10 mEq in 100 mL IVPB (10 mEq Intravenous New Bag/Given 06/10/21 0047)  naproxen (NAPROSYN) tablet 375 mg (375 mg Oral Given 06/10/21 0034)  potassium chloride SA (KLOR-CON) CR tablet 40 mEq (40 mEq Oral Given 06/10/21 0034)  calcium carbonate (TUMS - dosed in mg elemental calcium) chewable tablet 400 mg of elemental calcium (400 mg of elemental calcium Oral Given 06/10/21 0034)    ED Course  I have reviewed the triage vital signs and the nursing  notes.  Pertinent labs & imaging results that were available during my care of the patient were reviewed by me and considered in my medical decision making (see chart for details).    MDM Rules/Calculators/A&P                           Scott Norton is a 55 year old male with history of sickle cell anemia who presents to the ED for back and ankle pain.  Clinically, his back is nontender to palpation along midline and he has full range of motion.  He has no clinical signs of cauda equina or significant trauma to the back which makes me less suspicious for musculoskeletal pathology.  His ankle was nontender to palpation and the foot was neurovascularly intact distal to the ankle.  He had full range of motion to the ankle and good sensation.  Imaging was reassuring for any acute pathology and was personally reviewed by myself.  I agree with the radiologist interpretation.    Given history of sickle cell anemia, CMP, reticulocyte count, CBC, and CK were ordered.  He was noted to be hypokalemic, anemic, and hypocalcemic.  Corrected hypocalcemic for hypoalbuminemia was 7.6.  400 mg of elemental calcium was given.  There was no clinical signs of hypocalcemia including Trousseau's or Chvostek sign.  40 mEq of potassium was given p.o. in addition to 10 mEq IV for adequate repletion.  He was noted to be slightly hypoglycemic despite being asymptomatic and was given some food.  He was found to be hypoxic and subsequent imaging makes me suspicious for acute chest syndrome or possible pulmonary embolism secondary to sickle cell.  On exam his lungs are clear to auscultation throughout all lung fields.  Pneumonia in my opinion is less likely. Patient has been started on antibiotics as a precaution. CT angiography was ordered to further evaluate for pulmonary embolus.  This could explain some of his back pain.  Who was found be anemic at 6.2  with a baseline hemoglobin of 8.0.  He is likely experiencing some level  hemolysis with a high reticulocyte count percentage.  He is currently asymptomatic and hemodynamically stable therefore I do not believe he needs emergent transfusion at this time. Patient admitted to [facility].  Final Clinical Impression(s) / ED Diagnoses Final diagnoses:  Acute respiratory failure with hypoxia (HCC)  Acute on chronic anemia  Hypokalemia  Hypocalcemia    Rx / DC Orders ED Discharge Orders     None        Teressa LowerFleming, Lamica Mccart M, [place]PA-C 06/10/21 0546    Glynn Octaveancour, Stephen, MD 06/10/21 [id number]

## 2021-06-09 NOTE — Telephone Encounter (Signed)
   Notes to clinic Not a pt in this practice.  

## 2021-06-09 NOTE — ED Triage Notes (Signed)
Patient arrives complaining of generalized pain, with focus on his foot and shoulders. Patient states he has been walking a lot and felt a pop in his right foot and has been having foot and ankle pain since. Patient with multiple events to cause his multiple complaints.

## 2021-06-10 ENCOUNTER — Encounter (HOSPITAL_COMMUNITY): Payer: Self-pay

## 2021-06-10 ENCOUNTER — Emergency Department (HOSPITAL_COMMUNITY): Payer: Medicare HMO

## 2021-06-10 ENCOUNTER — Other Ambulatory Visit (HOSPITAL_COMMUNITY): Payer: Self-pay

## 2021-06-10 DIAGNOSIS — Z653 Problems related to other legal circumstances: Secondary | ICD-10-CM | POA: Diagnosis not present

## 2021-06-10 DIAGNOSIS — Z8249 Family history of ischemic heart disease and other diseases of the circulatory system: Secondary | ICD-10-CM | POA: Diagnosis not present

## 2021-06-10 DIAGNOSIS — J9601 Acute respiratory failure with hypoxia: Secondary | ICD-10-CM | POA: Diagnosis present

## 2021-06-10 DIAGNOSIS — R69 Illness, unspecified: Secondary | ICD-10-CM | POA: Diagnosis not present

## 2021-06-10 DIAGNOSIS — D649 Anemia, unspecified: Secondary | ICD-10-CM | POA: Diagnosis not present

## 2021-06-10 DIAGNOSIS — M549 Dorsalgia, unspecified: Secondary | ICD-10-CM | POA: Diagnosis present

## 2021-06-10 DIAGNOSIS — D5701 Hb-SS disease with acute chest syndrome: Secondary | ICD-10-CM | POA: Diagnosis not present

## 2021-06-10 DIAGNOSIS — I517 Cardiomegaly: Secondary | ICD-10-CM | POA: Diagnosis not present

## 2021-06-10 DIAGNOSIS — G894 Chronic pain syndrome: Secondary | ICD-10-CM | POA: Diagnosis present

## 2021-06-10 DIAGNOSIS — Z20822 Contact with and (suspected) exposure to covid-19: Secondary | ICD-10-CM | POA: Diagnosis not present

## 2021-06-10 DIAGNOSIS — D5700 Hb-SS disease with crisis, unspecified: Secondary | ICD-10-CM | POA: Diagnosis present

## 2021-06-10 DIAGNOSIS — D696 Thrombocytopenia, unspecified: Secondary | ICD-10-CM | POA: Diagnosis not present

## 2021-06-10 DIAGNOSIS — Z885 Allergy status to narcotic agent status: Secondary | ICD-10-CM | POA: Diagnosis not present

## 2021-06-10 DIAGNOSIS — F1120 Opioid dependence, uncomplicated: Secondary | ICD-10-CM | POA: Diagnosis present

## 2021-06-10 DIAGNOSIS — F17290 Nicotine dependence, other tobacco product, uncomplicated: Secondary | ICD-10-CM | POA: Diagnosis present

## 2021-06-10 DIAGNOSIS — M1990 Unspecified osteoarthritis, unspecified site: Secondary | ICD-10-CM | POA: Diagnosis present

## 2021-06-10 DIAGNOSIS — Z5900 Homelessness unspecified: Secondary | ICD-10-CM | POA: Diagnosis not present

## 2021-06-10 DIAGNOSIS — Z832 Family history of diseases of the blood and blood-forming organs and certain disorders involving the immune mechanism: Secondary | ICD-10-CM | POA: Diagnosis not present

## 2021-06-10 DIAGNOSIS — D638 Anemia in other chronic diseases classified elsewhere: Secondary | ICD-10-CM | POA: Diagnosis not present

## 2021-06-10 DIAGNOSIS — F32A Depression, unspecified: Secondary | ICD-10-CM | POA: Diagnosis present

## 2021-06-10 DIAGNOSIS — J9811 Atelectasis: Secondary | ICD-10-CM | POA: Diagnosis not present

## 2021-06-10 DIAGNOSIS — R0902 Hypoxemia: Secondary | ICD-10-CM | POA: Diagnosis not present

## 2021-06-10 DIAGNOSIS — Z811 Family history of alcohol abuse and dependence: Secondary | ICD-10-CM | POA: Diagnosis not present

## 2021-06-10 DIAGNOSIS — Z886 Allergy status to analgesic agent status: Secondary | ICD-10-CM | POA: Diagnosis not present

## 2021-06-10 DIAGNOSIS — Z91048 Other nonmedicinal substance allergy status: Secondary | ICD-10-CM | POA: Diagnosis not present

## 2021-06-10 DIAGNOSIS — E876 Hypokalemia: Secondary | ICD-10-CM | POA: Diagnosis not present

## 2021-06-10 LAB — CBC WITH DIFFERENTIAL/PLATELET
Band Neutrophils: 0 %
Basophils Relative: 0 %
Blasts: NONE SEEN %
Eosinophils Relative: 5 %
HCT: 17.8 % — ABNORMAL LOW (ref 39.0–52.0)
Hemoglobin: 6.2 g/dL — CL (ref 13.0–17.0)
Lymphocytes Relative: 29 %
MCH: 31.3 pg (ref 26.0–34.0)
MCHC: 34.8 g/dL (ref 30.0–36.0)
MCV: 89.9 fL (ref 80.0–100.0)
Metamyelocytes Relative: NONE SEEN %
Monocytes Relative: 15 %
Myelocytes: NONE SEEN %
Neutrophils Relative %: 51 %
Platelets: 141 10*3/uL — ABNORMAL LOW (ref 150–400)
Promyelocytes Relative: NONE SEEN %
RBC: 1.98 MIL/uL — ABNORMAL LOW (ref 4.22–5.81)
RDW: 24.1 % — ABNORMAL HIGH (ref 11.5–15.5)
WBC Morphology: NORMAL
WBC: 6.1 10*3/uL (ref 4.0–10.5)
nRBC: 18 /100 WBC — ABNORMAL HIGH

## 2021-06-10 LAB — COMPREHENSIVE METABOLIC PANEL
ALT: 20 U/L (ref 0–44)
AST: 41 U/L (ref 15–41)
Albumin: 3.9 g/dL (ref 3.5–5.0)
Alkaline Phosphatase: 174 U/L — ABNORMAL HIGH (ref 38–126)
Anion gap: 7 (ref 5–15)
BUN: 18 mg/dL (ref 6–20)
CO2: 21 mmol/L — ABNORMAL LOW (ref 22–32)
Calcium: 9.2 mg/dL (ref 8.9–10.3)
Chloride: 115 mmol/L — ABNORMAL HIGH (ref 98–111)
Creatinine, Ser: 1.05 mg/dL (ref 0.61–1.24)
GFR, Estimated: >60 mL/min (ref >60)
Glucose, Bld: 91 mg/dL (ref 70–99)
Potassium: 4.3 mmol/L (ref 3.5–5.1)
Sodium: 143 mmol/L (ref 135–145)
Total Bilirubin: 1.9 mg/dL — ABNORMAL HIGH (ref 0.3–1.2)
Total Protein: 6.6 g/dL (ref 6.5–8.1)

## 2021-06-10 LAB — CBC
HCT: 17.5 % — ABNORMAL LOW (ref 39.0–52.0)
Hemoglobin: 5.9 g/dL — CL (ref 13.0–17.0)
MCH: 31.2 pg (ref 26.0–34.0)
MCHC: 33.7 g/dL (ref 30.0–36.0)
MCV: 92.6 fL (ref 80.0–100.0)
Platelets: 58 10*3/uL — ABNORMAL LOW (ref 150–400)
RBC: 1.89 MIL/uL — ABNORMAL LOW (ref 4.22–5.81)
RDW: 25 % — ABNORMAL HIGH (ref 11.5–15.5)
WBC: 6.6 10*3/uL (ref 4.0–10.5)
nRBC: 9.4 % — ABNORMAL HIGH (ref 0.0–0.2)

## 2021-06-10 LAB — PREPARE RBC (CROSSMATCH)

## 2021-06-10 LAB — CBG MONITORING, ED: Glucose-Capillary: 90 mg/dL (ref 70–99)

## 2021-06-10 LAB — MAGNESIUM: Magnesium: 1.9 mg/dL (ref 1.7–2.4)

## 2021-06-10 LAB — RESP PANEL BY RT-PCR (FLU A&B, COVID) ARPGX2
Influenza A by PCR: NEGATIVE
Influenza B by PCR: NEGATIVE
SARS Coronavirus 2 by RT PCR: NEGATIVE

## 2021-06-10 MED ORDER — CALCIUM CARBONATE ANTACID 500 MG PO CHEW
400.0000 mg | CHEWABLE_TABLET | Freq: Once | ORAL | Status: AC
  Administered 2021-06-10: 400 mg via ORAL
  Filled 2021-06-10: qty 2

## 2021-06-10 MED ORDER — SENNOSIDES-DOCUSATE SODIUM 8.6-50 MG PO TABS
1.0000 | ORAL_TABLET | Freq: Two times a day (BID) | ORAL | Status: DC
  Administered 2021-06-10 – 2021-06-12 (×6): 1 via ORAL
  Filled 2021-06-10 (×6): qty 1

## 2021-06-10 MED ORDER — GUAIFENESIN-DM 100-10 MG/5ML PO SYRP
5.0000 mL | ORAL_SOLUTION | ORAL | Status: DC | PRN
  Administered 2021-06-10: 5 mL via ORAL
  Filled 2021-06-10: qty 10

## 2021-06-10 MED ORDER — SODIUM CHLORIDE 0.45 % IV SOLN: INTRAVENOUS | Status: DC

## 2021-06-10 MED ORDER — OXYCODONE HCL 5 MG PO TABS
5.0000 mg | ORAL_TABLET | ORAL | Status: DC | PRN
  Administered 2021-06-10: 10 mg via ORAL
  Filled 2021-06-10: qty 2

## 2021-06-10 MED ORDER — POTASSIUM CHLORIDE CRYS ER 20 MEQ PO TBCR
40.0000 meq | EXTENDED_RELEASE_TABLET | Freq: Once | ORAL | Status: AC
  Administered 2021-06-10: 40 meq via ORAL
  Filled 2021-06-10: qty 2

## 2021-06-10 MED ORDER — DIPHENHYDRAMINE HCL 25 MG PO CAPS: 25.0000 mg | ORAL_CAPSULE | ORAL | Status: DC | PRN

## 2021-06-10 MED ORDER — OXYCODONE-ACETAMINOPHEN 5-325 MG PO TABS
1.0000 | ORAL_TABLET | Freq: Once | ORAL | Status: AC
  Administered 2021-06-10: 1 via ORAL
  Filled 2021-06-10: qty 1

## 2021-06-10 MED ORDER — POLYETHYLENE GLYCOL 3350 17 G PO PACK
17.0000 g | PACK | Freq: Every day | ORAL | Status: DC | PRN
  Administered 2021-06-11 – 2021-06-12 (×2): 17 g via ORAL
  Filled 2021-06-10 (×2): qty 1

## 2021-06-10 MED ORDER — SODIUM CHLORIDE 0.9 % IV SOLN
500.0000 mg | INTRAVENOUS | Status: DC
  Administered 2021-06-11 – 2021-06-12 (×2): 500 mg via INTRAVENOUS
  Filled 2021-06-10 (×3): qty 500

## 2021-06-10 MED ORDER — FAMOTIDINE IN NACL 20-0.9 MG/50ML-% IV SOLN: 20.0000 mg | Freq: Two times a day (BID) | INTRAVENOUS | Status: DC

## 2021-06-10 MED ORDER — SODIUM CHLORIDE 0.9 % IV SOLN
1.0000 g | Freq: Once | INTRAVENOUS | Status: AC
  Administered 2021-06-10: 1 g via INTRAVENOUS
  Filled 2021-06-10: qty 10

## 2021-06-10 MED ORDER — HYDROMORPHONE HCL 1 MG/ML IJ SOLN
1.0000 mg | INTRAMUSCULAR | Status: DC | PRN
Start: 2021-06-10 — End: 2021-06-10
  Administered 2021-06-10: 1 mg via INTRAVENOUS
  Filled 2021-06-10: qty 1

## 2021-06-10 MED ORDER — ENOXAPARIN SODIUM 40 MG/0.4ML IJ SOSY
40.0000 mg | PREFILLED_SYRINGE | INTRAMUSCULAR | Status: DC
  Administered 2021-06-10 – 2021-06-12 (×3): 40 mg via SUBCUTANEOUS
  Filled 2021-06-10 (×3): qty 0.4

## 2021-06-10 MED ORDER — SODIUM CHLORIDE 0.9 % IV SOLN
25.0000 mg | INTRAVENOUS | Status: DC | PRN
  Filled 2021-06-10: qty 0.5

## 2021-06-10 MED ORDER — NALOXONE HCL 0.4 MG/ML IJ SOLN: 0.4000 mg | INTRAMUSCULAR | Status: DC | PRN

## 2021-06-10 MED ORDER — ONDANSETRON HCL 4 MG PO TABS: 4.0000 mg | ORAL_TABLET | ORAL | Status: DC | PRN

## 2021-06-10 MED ORDER — SODIUM CHLORIDE 0.9 % IV SOLN
1.0000 g | INTRAVENOUS | Status: DC
  Administered 2021-06-11 – 2021-06-12 (×2): 1 g via INTRAVENOUS
  Filled 2021-06-10 (×3): qty 10

## 2021-06-10 MED ORDER — POTASSIUM CHLORIDE 10 MEQ/100ML IV SOLN
10.0000 meq | Freq: Once | INTRAVENOUS | Status: AC
  Administered 2021-06-10: 10 meq via INTRAVENOUS
  Filled 2021-06-10: qty 100

## 2021-06-10 MED ORDER — AZITHROMYCIN 250 MG PO TABS
500.0000 mg | ORAL_TABLET | Freq: Once | ORAL | Status: AC
  Administered 2021-06-10: 500 mg via ORAL
  Filled 2021-06-10: qty 2

## 2021-06-10 MED ORDER — HYDROMORPHONE 1 MG/ML IV SOLN
INTRAVENOUS | Status: DC
  Administered 2021-06-10 (×2): 1 mg via INTRAVENOUS
  Administered 2021-06-11: 0.5 mg via INTRAVENOUS
  Administered 2021-06-11: 1 mg via INTRAVENOUS
  Administered 2021-06-11: 0.5 mg via INTRAVENOUS
  Administered 2021-06-11: 1.5 mg via INTRAVENOUS
  Filled 2021-06-10: qty 30

## 2021-06-10 MED ORDER — KETOROLAC TROMETHAMINE 30 MG/ML IJ SOLN
15.0000 mg | Freq: Four times a day (QID) | INTRAMUSCULAR | Status: DC
  Administered 2021-06-10 – 2021-06-11 (×2): 15 mg via INTRAVENOUS
  Filled 2021-06-10 (×2): qty 1

## 2021-06-10 MED ORDER — FAMOTIDINE 20 MG PO TABS
20.0000 mg | ORAL_TABLET | Freq: Two times a day (BID) | ORAL | Status: DC
  Administered 2021-06-10 – 2021-06-12 (×5): 20 mg via ORAL
  Filled 2021-06-10 (×6): qty 1

## 2021-06-10 MED ORDER — KETOROLAC TROMETHAMINE 30 MG/ML IJ SOLN
30.0000 mg | Freq: Four times a day (QID) | INTRAMUSCULAR | Status: DC
  Administered 2021-06-10: 30 mg via INTRAVENOUS
  Filled 2021-06-10: qty 1

## 2021-06-10 MED ORDER — FOLIC ACID 1 MG PO TABS
1.0000 mg | ORAL_TABLET | Freq: Every day | ORAL | Status: DC
  Administered 2021-06-10 – 2021-06-12 (×3): 1 mg via ORAL
  Filled 2021-06-10 (×3): qty 1

## 2021-06-10 MED ORDER — HYDROXYZINE HCL 25 MG PO TABS: 25.0000 mg | ORAL_TABLET | ORAL | Status: DC | PRN

## 2021-06-10 MED ORDER — IOHEXOL 350 MG/ML SOLN
80.0000 mL | Freq: Once | INTRAVENOUS | Status: AC | PRN
  Administered 2021-06-10: 80 mL via INTRAVENOUS

## 2021-06-10 MED ORDER — SODIUM CHLORIDE 0.9% FLUSH: 9.0000 mL | INTRAVENOUS | Status: DC | PRN

## 2021-06-10 MED ORDER — ONDANSETRON HCL 4 MG/2ML IJ SOLN
4.0000 mg | INTRAMUSCULAR | Status: DC | PRN
  Administered 2021-06-10 – 2021-06-11 (×2): 4 mg via INTRAVENOUS
  Filled 2021-06-10 (×2): qty 2

## 2021-06-10 NOTE — ED Notes (Signed)
Pt now requesting 8 packs of graham crackers because he didn't like the [place] sandwiches that were provided upon his request. Pt only ate half of one of the sandwiches. Graham crackers provided.

## 2021-06-10 NOTE — H&P (Signed)
[facility] History and Physical  [name] ZOX:[id number] DOB: May 28, 1966 DOA: 06/09/2021  Referring physician: Honor Loh, PA-C PCP: Massie Maroon, FNP   Chief Complaint: pain  HPI: Scott Norton is a 55 y.o. male with hx of sickle cell disease presents with pain.  Patient reports that for the past 3 days he has been having increasingly worse back pain.  Ports that he was recently diagnosed with back arthritis as well and initially thought it was that.  He had been staying in a hotel but last night had to sleep outside as well.  The pain got progressively worse which prompted him to present to the ED last night.  In some ways it does feel like his normal sickle cell pain.  He denies cough, fever.  He does endorse the pain getting so intense that at times it makes him catch his breath, but does not necessarily feel like he is short of breath.  No recent sick contacts.  No other symptoms. Per review of chart has not had any pain crisis for several months now.  In the ED initial vital signs were unremarkable but patient then became hypoxic to high 80's and was placed on 2.5L O2. Lab workup showed drop in baseline CBC values hgb 8>6.2 and platelets 141, CMP notable for K 3.2, calcium 6.6, and low albumin. Imaging was obtained which showed normal XR of the R foot and ankle. CR showed a LLL pulmonary infiltrate, and so given concern for acute chest syndrome a CTPA was obtained which was negative for PE but did show cardiomegaly and borderline interstitial edema as well as signs of emphysema. He was given ceftriaxone and azithromycin, naproxen and percocet for pain, potassium supplementation, and admitted for further management.   Review of Systems:  Pertinent positives and negative per HPI, all others reviewed and negative  Past Medical History:  Diagnosis Date   AKI (acute kidney injury) (HCC) 02/08/2021   Anemia    Depression    Heart murmur    Seasonal allergies    wool &  grass   Sickle cell anemia (HCC)    Past Surgical History:  Procedure Laterality Date   NO PAST SURGERIES     ROOT CANAL     Social History:  reports that he has been smoking cigars and cigarettes. He has been smoking an average of .5 packs per day. He has never used smokeless tobacco. He reports that he does not currently use alcohol. He reports current drug use. Drugs: Marijuana and Cocaine.  Allergies  Allergen Reactions   Aspirin Other (See Comments)    Increased heart rate   Tape Other (See Comments)    rash   Tramadol Hives and Itching    Family History  Problem Relation Age of Onset   Alcohol abuse Mother    Sickle cell trait Mother    Hypertension Mother    Alcohol abuse Father    Sickle cell trait Father    Early death Father        due to alcoholism   Hypertension Father    Hypertension Sister    Hypertension Brother    Hypertension Maternal Grandmother    Hypertension Maternal Grandfather    Hypertension Paternal Grandmother    Hypertension Paternal Grandfather      Prior to Admission medications   Medication Sig Start Date End Date Taking? Authorizing Provider  folic acid (FOLVITE) 1 MG tablet Take 1 tablet (1 mg total) by mouth daily. 05/02/21 05/02/22  Massie Maroon, FNP  ibuprofen (ADVIL) 800 MG tablet Take 1 tablet (800 mg total) by mouth every 8 (eight) hours as needed. 05/02/21   Massie Maroon, FNP  oxyCODONE-acetaminophen (PERCOCET) 10-325 MG tablet Take 1 tablet by mouth every 6 (six) hours as needed for up to 15 days for pain. 05/29/21 06/13/21  Quentin Angst, MD   Physical Exam: Vitals:   06/10/21 0600 06/10/21 0700 06/10/21 0758 06/10/21 0829  BP: 139/74 117/63  115/89  Pulse: (!) 55 (!) 56  (!) 55  Resp:  19  16  Temp:   97.9 F (36.6 C) 97.6 F (36.4 C)  TempSrc:    Oral  SpO2: 90% 94%  95%  Weight:      Height:        Wt Readings from Last 3 Encounters:  06/09/21 73.5 kg  05/02/21 73.5 kg  04/27/21 72.6 kg      General:  Appears calm and comfortable Eyes: no scleral icterus ENT: grossly normal hearing, lips & tongue Neck: no masses Cardiovascular: RRR, no m/r/g. No LE edema.  Respiratory: Some crackles in the right lower lung, but otherwise good air movement, no wheezes Abdomen: soft, ntnd Skin: no rash or induration seen on limited exam Musculoskeletal: grossly normal tone BUE/BLE Psychiatric: grossly normal mood and affect, speech fluent and appropriate Neurologic: grossly non-focal.          Labs on Admission:  Basic Metabolic Panel: Recent Labs  Lab 06/09/21 2327 06/10/21 0037  NA 142  --   K 3.2*  --   CL 120*  --   CO2 19*  --   GLUCOSE 63*  --   BUN 19  --   CREATININE 0.94  --   CALCIUM 6.6*  --   MG  --  1.9   Liver Function Tests: Recent Labs  Lab 06/09/21 2327  AST 32  ALT 16  ALKPHOS 134*  BILITOT 1.5*  PROT 4.9*  ALBUMIN 2.7*   No results for input(s): LIPASE, AMYLASE in the last 168 hours. No results for input(s): AMMONIA in the last 168 hours. CBC: Recent Labs  Lab 06/09/21 2327  WBC 6.1  HGB 6.2*  HCT 17.8*  MCV 89.9  PLT 141*   Cardiac Enzymes: Recent Labs  Lab 06/09/21 2327  CKTOTAL 55    BNP (last 3 results) Recent Labs    03/13/21 0628  BNP 325.9*    ProBNP (last 3 results) No results for input(s): PROBNP in the last 8760 hours.  CBG: Recent Labs  Lab 06/10/21 0253  GLUCAP 90    Radiological Exams on Admission: DG Chest 2 View  Result Date: 06/10/2021 CLINICAL DATA:  Hypoxia, EXAM: CHEST - 2 VIEW COMPARISON:  03/28/2021 FINDINGS: The lungs are symmetrically well expanded. There has developed patchy pulmonary infiltrate within the a left lower lobe, possibly infectious in the acute setting. No pneumothorax or pleural effusion. Cardiac size within normal limits. Pulmonary vascularity is normal. No acute bone abnormality. IMPRESSION: Interval development of a left lower lobe focal pulmonary infiltrate, possibly  infectious in the appropriate clinical setting. Electronically Signed   By: Helyn Numbers M.D.   On: 06/10/2021 03:06   DG Ankle Complete Right  Result Date: 06/09/2021 CLINICAL DATA:  Right foot and ankle pain EXAM: RIGHT FOOT COMPLETE - 3+ VIEW; RIGHT ANKLE - COMPLETE 3+ VIEW COMPARISON:  None. FINDINGS: Right ankle: Frontal, oblique, lateral views are obtained. No fracture, subluxation, or dislocation. Joint spaces are well preserved. Dystrophic  soft tissue calcifications posterior to the distal tibia may reflect sequela from previous trauma. Right foot: Frontal, oblique, and lateral views are obtained. No acute displaced fracture, subluxation, or dislocation. Mild hallux valgus deformity. Soft tissues grossly unremarkable. IMPRESSION: 1. Mild hallux valgus deformity. 2. No acute abnormality within the right foot or ankle. Electronically Signed   By: Sharlet Salina M.D.   On: 06/09/2021 22:18   CT Angio Chest PE W and/or Wo Contrast  Result Date: 06/10/2021 CLINICAL DATA:  Hypoxia EXAM: CT ANGIOGRAPHY CHEST WITH CONTRAST TECHNIQUE: Multidetector CT imaging of the chest was performed using the standard protocol during bolus administration of intravenous contrast. Multiplanar CT image reconstructions and MIPs were obtained to evaluate the vascular anatomy. CONTRAST:  75mL OMNIPAQUE IOHEXOL 350 MG/ML SOLN COMPARISON:  None. FINDINGS: Cardiovascular: Cardiomegaly. No pericardial effusion. Motion artifact affects peripheral branch visualization. No pulmonary artery filling defects. No acute aortic finding. Mitral valvular calcification. Mediastinum/Nodes: Negative for adenopathy or hematoma. Lungs/Pleura: Central airways are clear. Mild atelectasis and borderline interlobular septal thickening. Emphysematous change with fibrotic features seen at the apices. No pleural fluid or consolidation Upper Abdomen: Large appearance of the liver. Musculoskeletal: No acute or aggressive finding. Review of the MIP images  confirms the above findings. IMPRESSION: 1. Negative for pulmonary embolism. 2. Cardiomegaly and borderline interstitial edema. 3. Aortic Atherosclerosis (ICD10-I70.0) and Emphysema (ICD10-J43.9). 4. Motion artifact. Electronically Signed   By: Marnee Spring M.D.   On: 06/10/2021 05:14   DG Foot Complete Right  Result Date: 06/09/2021 CLINICAL DATA:  Right foot and ankle pain EXAM: RIGHT FOOT COMPLETE - 3+ VIEW; RIGHT ANKLE - COMPLETE 3+ VIEW COMPARISON:  None. FINDINGS: Right ankle: Frontal, oblique, lateral views are obtained. No fracture, subluxation, or dislocation. Joint spaces are well preserved. Dystrophic soft tissue calcifications posterior to the distal tibia may reflect sequela from previous trauma. Right foot: Frontal, oblique, and lateral views are obtained. No acute displaced fracture, subluxation, or dislocation. Mild hallux valgus deformity. Soft tissues grossly unremarkable. IMPRESSION: 1. Mild hallux valgus deformity. 2. No acute abnormality within the right foot or ankle. Electronically Signed   By: Sharlet Salina M.D.   On: 06/09/2021 22:18    EKG: Not obtained  Assessment/Plan Active Problems:   Sickle cell anemia (HCC)   Acute hypoxemic respiratory failure (HCC)   #Sickle cell crisis #Acute chest syndrome Patient presenting with 3 days of worsening back pain concerning for acute chest syndrome.  Clearly some element of his osteoarthritis is contributing as well but given history will treat conservatively for acute chest.  Discussed case with Dr. Hyman Hopes, he would like to have patient transferred to the sickle cell service with plan for partial exchange transfusion. - Appreciate sickle cell team input, will transfer to her service - Half-normal saline at 125 cc an hour continuous - Azithromycin 500 and ceftriaxone 1 g every 24 hours to continue for total of 5 days treatment - Prophylactic Lovenox - Continue home folic acid supplement - Dilaudid 1 mg every 3 hours as needed  for pain, do not feel that he needs a PCA at this time - Toradol 30 mg IV every 6 for 5 days - MiraLAX daily as needed - We will obtain blood cultures x2, limited utility given he is already received antibiotics - Incentive spirometry   Code Status: Full Code, confirmed DVT Prophylaxis: Lovenox Family Communication: none Disposition Plan: Obs, Med-Surg   Time spent: 50 min  Venora Maples MD/[facility]  Note:  This document was  prepared using Systems analyst and may include unintentional dictation errors.

## 2021-06-10 NOTE — Progress Notes (Signed)
Date and time results received: 06/10/21 1140 (use smartphrase ".now" to insert current time)  Test: hgb  Critical Value: 5.9  Name of Provider Notified: Dr. Hyman Hopes   Orders Received? Or Actions Taken?: Orders Received - See Orders for details

## 2021-06-10 NOTE — ED Notes (Signed)
Pt has requested two [place] sandwiches but they have to be warmed up. This nurse warmed two [place] sandwiches up and gave to patient. Pt then wants hot chocolate. Pt made aware that we don't have hot chocolate. Pt then wants hot tea, pt made aware that we don't have any tea at this time for him to have. Pt annoyed but then asked for two cola drinks. These have been provided for the patient.

## 2021-06-11 DIAGNOSIS — D5700 Hb-SS disease with crisis, unspecified: Secondary | ICD-10-CM | POA: Diagnosis not present

## 2021-06-11 DIAGNOSIS — D5701 Hb-SS disease with acute chest syndrome: Secondary | ICD-10-CM | POA: Diagnosis not present

## 2021-06-11 DIAGNOSIS — J9601 Acute respiratory failure with hypoxia: Secondary | ICD-10-CM

## 2021-06-11 LAB — CBC WITH DIFFERENTIAL/PLATELET
Abs Immature Granulocytes: 0.04 10*3/uL (ref 0.00–0.07)
Basophils Absolute: 0 10*3/uL (ref 0.0–0.1)
Basophils Relative: 1 %
Eosinophils Absolute: 0.4 10*3/uL (ref 0.0–0.5)
Eosinophils Relative: 5 %
HCT: 28.7 % — ABNORMAL LOW (ref 39.0–52.0)
Hemoglobin: 10 g/dL — ABNORMAL LOW (ref 13.0–17.0)
Immature Granulocytes: 1 %
Lymphocytes Relative: 21 %
Lymphs Abs: 1.7 10*3/uL (ref 0.7–4.0)
MCH: 29.9 pg (ref 26.0–34.0)
MCHC: 34.8 g/dL (ref 30.0–36.0)
MCV: 85.9 fL (ref 80.0–100.0)
Monocytes Absolute: 1 10*3/uL (ref 0.1–1.0)
Monocytes Relative: 12 %
Neutro Abs: 5.1 10*3/uL (ref 1.7–7.7)
Neutrophils Relative %: 60 %
Platelets: 102 10*3/uL — ABNORMAL LOW (ref 150–400)
RBC: 3.34 MIL/uL — ABNORMAL LOW (ref 4.22–5.81)
RDW: 21.8 % — ABNORMAL HIGH (ref 11.5–15.5)
WBC: 8.2 10*3/uL (ref 4.0–10.5)
nRBC: 10.8 % — ABNORMAL HIGH (ref 0.0–0.2)

## 2021-06-11 LAB — BASIC METABOLIC PANEL
Anion gap: 7 (ref 5–15)
BUN: 19 mg/dL (ref 6–20)
CO2: 21 mmol/L — ABNORMAL LOW (ref 22–32)
Calcium: 8.7 mg/dL — ABNORMAL LOW (ref 8.9–10.3)
Chloride: 112 mmol/L — ABNORMAL HIGH (ref 98–111)
Creatinine, Ser: 1.17 mg/dL (ref 0.61–1.24)
GFR, Estimated: >60 mL/min (ref >60)
Glucose, Bld: 65 mg/dL — ABNORMAL LOW (ref 70–99)
Potassium: 5.6 mmol/L — ABNORMAL HIGH (ref 3.5–5.1)
Sodium: 140 mmol/L (ref 135–145)

## 2021-06-11 MED ORDER — KETOROLAC TROMETHAMINE 15 MG/ML IJ SOLN
15.0000 mg | Freq: Four times a day (QID) | INTRAMUSCULAR | Status: DC
  Administered 2021-06-11 – 2021-06-13 (×9): 15 mg via INTRAVENOUS
  Filled 2021-06-11 (×8): qty 1

## 2021-06-11 MED ORDER — SODIUM ZIRCONIUM CYCLOSILICATE 5 G PO PACK
5.0000 g | PACK | Freq: Once | ORAL | Status: AC
  Administered 2021-06-11: 5 g via ORAL
  Filled 2021-06-11: qty 1

## 2021-06-11 MED ORDER — HYDROMORPHONE 1 MG/ML IV SOLN
INTRAVENOUS | Status: DC
  Administered 2021-06-11: 0 mg via INTRAVENOUS
  Administered 2021-06-11: 1.8 mg via INTRAVENOUS
  Administered 2021-06-12: 0.6 mg via INTRAVENOUS
  Administered 2021-06-12: 0.5 mg via INTRAVENOUS
  Administered 2021-06-12: 3.5 mg via INTRAVENOUS
  Administered 2021-06-12: 1.2 mg via INTRAVENOUS
  Administered 2021-06-13: 1.5 mg via INTRAVENOUS
  Administered 2021-06-13: 0.6 mg via INTRAVENOUS

## 2021-06-11 NOTE — Progress Notes (Signed)
Patient ID: Scott Norton, male   DOB: 1966/03/03, 55 y.o.   MRN: [id number] Subjective: Scott Norton is a 55 year old male with a medical history significant for sickle cell disease, chronic pain syndrome, opiate dependence and tolerance, history of anemia of chronic disease was admitted for sickle cell pain crisis.  Patient said he is slightly improved today but still coughing, cough is productive of yellowish sputum.  He denies any shortness of breath but he is saturating at 91% on 2 L of oxygen.  He has no new complaints today.  He denies any fever, chest pain, dizziness, headache, nausea, vomiting or diarrhea.  No urinary symptoms.  Objective:  Vital signs in last 24 hours:  Vitals:   06/11/21 0800 06/11/21 1022 06/11/21 1205 06/11/21 1356  BP:  96/83  117/69  Pulse:  75  64  Resp: 13 19 18 17   Temp:    98.7 F (37.1 C)  TempSrc:    Oral  SpO2: 91% 92% 97% 90%  Weight:      Height:        Intake/Output from previous day:   Intake/Output Summary (Last 24 hours) at 06/11/2021 1424 Last data filed at 06/11/2021 1024 Gross per 24 hour  Intake 3724.27 ml  Output 1100 ml  Net 2624.27 ml    Physical Exam: General: Alert, awake, oriented x3, in no acute distress.  HEENT: Halfway/AT PEERL, EOMI Neck: Trachea midline,  no masses, no thyromegal,y no JVD, no carotid bruit OROPHARYNX:  Moist, No exudate/ erythema/lesions.  Heart: Regular rate and rhythm, without murmurs, rubs, gallops, PMI non-displaced, no heaves or thrills on palpation.  Lungs: Clear to auscultation, no wheezing or rhonchi noted. No increased vocal fremitus resonant to percussion  Abdomen: Soft, nontender, nondistended, positive bowel sounds, no masses no hepatosplenomegaly noted..  Neuro: No focal neurological deficits noted cranial nerves II through XII grossly intact. DTRs 2+ bilaterally upper and lower extremities. Strength 5 out of 5 in bilateral upper and lower extremities. Musculoskeletal: No warm swelling or  erythema around joints, no spinal tenderness noted. Psychiatric: Patient alert and oriented x3, good insight and cognition, good recent to remote recall. Lymph node survey: No cervical axillary or inguinal lymphadenopathy noted.  Lab Results:  Basic Metabolic Panel:    Component Value Date/Time   NA 140 06/11/2021 0514   NA 143 05/02/2021 1337   K 5.6 (H) 06/11/2021 0514   CL 112 (H) 06/11/2021 0514   CO2 21 (L) 06/11/2021 0514   BUN 19 06/11/2021 0514   BUN 11 05/02/2021 1337   CREATININE 1.17 06/11/2021 0514   CREATININE 0.90 07/19/2014 1128   GLUCOSE 65 (L) 06/11/2021 0514   CALCIUM 8.7 (L) 06/11/2021 0514   CBC:    Component Value Date/Time   WBC 8.2 06/11/2021 0514   HGB 10.0 (L) 06/11/2021 0514   HGB 8.0 (L) 05/02/2021 1337   HCT 28.7 (L) 06/11/2021 0514   HCT 23.9 (L) 05/02/2021 1337   PLT 102 (L) 06/11/2021 0514   PLT 184 05/02/2021 1337   MCV 85.9 06/11/2021 0514   MCV 95 05/02/2021 1337   NEUTROABS 5.1 06/11/2021 0514   NEUTROABS 3.2 05/02/2021 1337   LYMPHSABS 1.7 06/11/2021 0514   LYMPHSABS 3.0 05/02/2021 1337   MONOABS 1.0 06/11/2021 0514   EOSABS 0.4 06/11/2021 0514   EOSABS 0.2 05/02/2021 1337   BASOSABS 0.0 06/11/2021 0514   BASOSABS 0.0 05/02/2021 1337    Recent Results (from the past 240 hour(s))  Resp Panel by RT-PCR (Flu A&B,  Covid) Nasopharyngeal Swab     Status: None   Collection Time: 06/10/21  2:56 AM   Specimen: Nasopharyngeal Swab; Nasopharyngeal(NP) swabs in vial transport medium  Result Value Ref Range Status   SARS Coronavirus 2 by RT PCR NEGATIVE NEGATIVE Final    Comment: (NOTE) SARS-CoV-2 target nucleic acids are NOT DETECTED.  The SARS-CoV-2 RNA is generally detectable in upper respiratory specimens during the acute phase of infection. The lowest concentration of SARS-CoV-2 viral copies this assay can detect is 138 copies/mL. A negative result does not preclude SARS-Cov-2 infection and should not be used as the sole basis for  treatment or other patient management decisions. A negative result may occur with  improper specimen collection/handling, submission of specimen other than nasopharyngeal swab, presence of viral mutation(s) within the areas targeted by this assay, and inadequate number of viral copies(<138 copies/mL). A negative result must be combined with clinical observations, patient history, and epidemiological information. The expected result is Negative.  Fact Sheet for Patients:  BloggerCourse.com  Fact Sheet for Healthcare Providers:  SeriousBroker.it  This test is no t yet approved or cleared by the [place] FDA and  has been authorized for detection and/or diagnosis of SARS-CoV-2 by FDA under an Emergency Use Authorization (EUA). This EUA will remain  in effect (meaning this test can be used) for the duration of the COVID-19 declaration under Section 564(b)(1) of the Act, 21 U.S.C.section 360bbb-3(b)(1), unless the authorization is terminated  or revoked sooner.       Influenza A by PCR NEGATIVE NEGATIVE Final   Influenza B by PCR NEGATIVE NEGATIVE Final    Comment: (NOTE) The Xpert Xpress SARS-CoV-2/FLU/RSV plus assay is intended as an aid in the diagnosis of influenza from Nasopharyngeal swab specimens and should not be used as a sole basis for treatment. Nasal washings and aspirates are unacceptable for Xpert Xpress SARS-CoV-2/FLU/RSV testing.  Fact Sheet for Patients: BloggerCourse.com  Fact Sheet for Healthcare Providers: SeriousBroker.it  This test is not yet approved or cleared by the [place] FDA and has been authorized for detection and/or diagnosis of SARS-CoV-2 by FDA under an Emergency Use Authorization (EUA). This EUA will remain in effect (meaning this test can be used) for the duration of the COVID-19 declaration under Section 564(b)(1) of the Act, 21  U.S.C. section 360bbb-3(b)(1), unless the authorization is terminated or revoked.  Performed at [facility], [address] [place], [place] [address]   Culture, blood (routine x 2)     Status: None (Preliminary result)   Collection Time: 06/10/21 10:56 AM   Specimen: BLOOD  Result Value Ref Range Status   Specimen Description   Final    BLOOD BLOOD LEFT HAND Performed at [facility], [address], [place], [place] [address]    Special Requests   Final    BOTTLES DRAWN AEROBIC ONLY Blood Culture adequate volume Performed at [facility], [address] [place], [place] [address]    Culture   Final    NO GROWTH < 24 HOURS Performed at [facility], [address], [place], [place] [address]    Report Status PENDING  Incomplete    Studies/Results: DG Chest 2 View  Result Date: 06/10/2021 CLINICAL DATA:  Hypoxia, EXAM: CHEST - 2 VIEW COMPARISON:  03/28/2021 FINDINGS: The lungs are symmetrically well expanded. There has developed patchy pulmonary infiltrate within the a left lower lobe, possibly infectious in the acute setting. No pneumothorax or pleural effusion.  Cardiac size within normal limits. Pulmonary vascularity is normal. No acute bone abnormality. IMPRESSION: Interval development of a left lower lobe focal pulmonary infiltrate, possibly infectious in the appropriate clinical setting. Electronically Signed   By: Helyn Numbers M.D.   On: 06/10/2021 03:06   DG Ankle Complete Right  Result Date: 06/09/2021 CLINICAL DATA:  Right foot and ankle pain EXAM: RIGHT FOOT COMPLETE - 3+ VIEW; RIGHT ANKLE - COMPLETE 3+ VIEW COMPARISON:  None. FINDINGS: Right ankle: Frontal, oblique, lateral views are obtained. No fracture, subluxation, or dislocation. Joint spaces are well preserved. Dystrophic soft tissue calcifications posterior to the distal tibia may reflect sequela from previous trauma. Right foot: Frontal, oblique,  and lateral views are obtained. No acute displaced fracture, subluxation, or dislocation. Mild hallux valgus deformity. Soft tissues grossly unremarkable. IMPRESSION: 1. Mild hallux valgus deformity. 2. No acute abnormality within the right foot or ankle. Electronically Signed   By: Sharlet Salina M.D.   On: 06/09/2021 22:18   CT Angio Chest PE W and/or Wo Contrast  Result Date: 06/10/2021 CLINICAL DATA:  Hypoxia EXAM: CT ANGIOGRAPHY CHEST WITH CONTRAST TECHNIQUE: Multidetector CT imaging of the chest was performed using the standard protocol during bolus administration of intravenous contrast. Multiplanar CT image reconstructions and MIPs were obtained to evaluate the vascular anatomy. CONTRAST:  100mL OMNIPAQUE IOHEXOL 350 MG/ML SOLN COMPARISON:  None. FINDINGS: Cardiovascular: Cardiomegaly. No pericardial effusion. Motion artifact affects peripheral branch visualization. No pulmonary artery filling defects. No acute aortic finding. Mitral valvular calcification. Mediastinum/Nodes: Negative for adenopathy or hematoma. Lungs/Pleura: Central airways are clear. Mild atelectasis and borderline interlobular septal thickening. Emphysematous change with fibrotic features seen at the apices. No pleural fluid or consolidation Upper Abdomen: Large appearance of the liver. Musculoskeletal: No acute or aggressive finding. Review of the MIP images confirms the above findings. IMPRESSION: 1. Negative for pulmonary embolism. 2. Cardiomegaly and borderline interstitial edema. 3. Aortic Atherosclerosis (ICD10-I70.0) and Emphysema (ICD10-J43.9). 4. Motion artifact. Electronically Signed   By: Marnee Spring M.D.   On: 06/10/2021 05:14   DG Foot Complete Right  Result Date: 06/09/2021 CLINICAL DATA:  Right foot and ankle pain EXAM: RIGHT FOOT COMPLETE - 3+ VIEW; RIGHT ANKLE - COMPLETE 3+ VIEW COMPARISON:  None. FINDINGS: Right ankle: Frontal, oblique, lateral views are obtained. No fracture, subluxation, or dislocation.  Joint spaces are well preserved. Dystrophic soft tissue calcifications posterior to the distal tibia may reflect sequela from previous trauma. Right foot: Frontal, oblique, and lateral views are obtained. No acute displaced fracture, subluxation, or dislocation. Mild hallux valgus deformity. Soft tissues grossly unremarkable. IMPRESSION: 1. Mild hallux valgus deformity. 2. No acute abnormality within the right foot or ankle. Electronically Signed   By: Sharlet Salina M.D.   On: 06/09/2021 22:18    Medications: Scheduled Meds:  enoxaparin (LOVENOX) injection  40 mg Subcutaneous Q24H   famotidine  20 mg Oral Q12H   folic acid  1 mg Oral Daily   HYDROmorphone   Intravenous Q4H   ketorolac  15 mg Intravenous Q6H   senna-docusate  1 tablet Oral BID   Continuous Infusions:  sodium chloride 125 mL/hr at 06/11/21 1233   azithromycin 500 mg (06/11/21 0941)   cefTRIAXone (ROCEPHIN)  IV 1 g (06/11/21 0849)   diphenhydrAMINE     famotidine (PEPCID) IV     PRN Meds:.diphenhydrAMINE **OR** diphenhydrAMINE, guaiFENesin-dextromethorphan, hydrOXYzine, naloxone **AND** sodium chloride flush, ondansetron **OR** ondansetron (ZOFRAN) IV, oxyCODONE, polyethylene glycol  Consultants: None  Procedures: None  Antibiotics: Ceftriaxone and azithromycin  Assessment/Plan: Active Problems:   Sickle cell anemia (HCC)   Acute hypoxemic respiratory failure (HCC)   Sickle cell anemia with crisis (HCC)  Acute chest syndrome: Patient had new infiltrates on chest x-ray and hypoxia with desaturated to upper 80s and low 90s even on 2 L of oxygen.  Patient has had to go up to 4 L to maintain his oxygenation.  We will continue supplemental oxygen and antibiotics for now.  Incentive spirometry and early ambulation encouraged. Hb Sickle Cell Disease with crisis: [facility]  Begin to wean weight based Dilaudid PCA to 0.3/10/1.8.  Continue IV Toradol 15 mg Q 6 H, continue oral home pain medications. Monitor vitals  very closely, Re-evaluate pain scale regularly, 2 L of Oxygen by [place]. Sickle Cell Anemia: Patient is status post transfusion of 2 units of packed red blood cell for hemoglobin of 5.9 which was 3 points below his baseline. Hemoglobin is now 10. Patient is hemodynamically stable. We will continue to monitor closely. Chronic pain Syndrome: Continue home medications. Tobacco use disorder: Scott BeeKevin was counseled on the dangers of tobacco use, and was advised to quit. Reviewed strategies to maximize success, including removing cigarettes and smoking materials from environment, stress management and support of family/friends.   Code Status: Full Code Family Communication: N/A Disposition Plan: Not yet ready for discharge  Scott Norton  If 7PM-7AM, please contact night-coverage.  06/11/2021, 2:24 PM  LOS: 1 day

## 2021-06-12 DIAGNOSIS — D5700 Hb-SS disease with crisis, unspecified: Secondary | ICD-10-CM | POA: Diagnosis not present

## 2021-06-12 DIAGNOSIS — J9601 Acute respiratory failure with hypoxia: Secondary | ICD-10-CM | POA: Diagnosis not present

## 2021-06-12 LAB — CBC WITH DIFFERENTIAL/PLATELET
Abs Immature Granulocytes: 0.05 10*3/uL (ref 0.00–0.07)
Basophils Absolute: 0 10*3/uL (ref 0.0–0.1)
Basophils Relative: 0 %
Eosinophils Absolute: 0.3 10*3/uL (ref 0.0–0.5)
Eosinophils Relative: 4 %
HCT: 20.3 % — ABNORMAL LOW (ref 39.0–52.0)
Hemoglobin: 7.3 g/dL — ABNORMAL LOW (ref 13.0–17.0)
Immature Granulocytes: 1 %
Lymphocytes Relative: 24 %
Lymphs Abs: 2 10*3/uL (ref 0.7–4.0)
MCH: 30.3 pg (ref 26.0–34.0)
MCHC: 36 g/dL (ref 30.0–36.0)
MCV: 84.2 fL (ref 80.0–100.0)
Monocytes Absolute: 0.8 10*3/uL (ref 0.1–1.0)
Monocytes Relative: 10 %
Neutro Abs: 5 10*3/uL (ref 1.7–7.7)
Neutrophils Relative %: 61 %
Platelets: 140 10*3/uL — ABNORMAL LOW (ref 150–400)
RBC: 2.41 MIL/uL — ABNORMAL LOW (ref 4.22–5.81)
RDW: 21.6 % — ABNORMAL HIGH (ref 11.5–15.5)
WBC: 8.1 10*3/uL (ref 4.0–10.5)
nRBC: 11.4 % — ABNORMAL HIGH (ref 0.0–0.2)

## 2021-06-12 LAB — TYPE AND SCREEN
ABO/RH(D): O POS
Antibody Screen: NEGATIVE
Unit division: 0
Unit division: 0

## 2021-06-12 LAB — BASIC METABOLIC PANEL
Anion gap: 3 — ABNORMAL LOW (ref 5–15)
BUN: 20 mg/dL (ref 6–20)
CO2: 23 mmol/L (ref 22–32)
Calcium: 8.8 mg/dL — ABNORMAL LOW (ref 8.9–10.3)
Chloride: 114 mmol/L — ABNORMAL HIGH (ref 98–111)
Creatinine, Ser: 1.2 mg/dL (ref 0.61–1.24)
GFR, Estimated: >60 mL/min (ref >60)
Glucose, Bld: 99 mg/dL (ref 70–99)
Potassium: 4.9 mmol/L (ref 3.5–5.1)
Sodium: 140 mmol/L (ref 135–145)

## 2021-06-12 LAB — BPAM RBC
Blood Product Expiration Date: 202209212359
Blood Product Expiration Date: 202209242359
ISSUE DATE / TIME: 202208201441
ISSUE DATE / TIME: 202208202043
Unit Type and Rh: 5100
Unit Type and Rh: 5100

## 2021-06-12 NOTE — Progress Notes (Signed)
Subjective: Patient is a 55 year old admitted with acute chest syndrome, chronic pain opiate dependence as well as hypoxemia.  Patient is still on 2 L of oxygen.  He is also getting IV antibiotics with Rocephin and Zithromax.Marland Kitchen  He is having no significant pain today.  Denied any fever or chills denied any nausea vomiting or diarrhea.  Objective: Vital signs in last 24 hours: Temp:  [97.8 F (36.6 C)-100.1 F (37.8 C)] 98.4 F (36.9 C) (08/22 1444) Pulse Rate:  [57-85] 57 (08/22 1444) Resp:  [14-20] 16 (08/22 1444) BP: (105-133)/(64-71) 120/68 (08/22 1444) SpO2:  [86 %-100 %] 86 % (08/22 1444) FiO2 (%):  [0 %] 0 % (08/22 0800) Weight change:  Last BM Date: 06/09/21  Intake/Output from previous day: 08/21 0701 - 08/22 0700 In: 1913.5 [P.O.:480; I.V.:1083.5; IV Piggyback:350] Out: 1750 [Urine:1750] Intake/Output this shift: Total I/O In: 100 [IV Piggyback:100] Out: 580 [Urine:580]  General appearance: alert, cooperative, appears stated age, and no distress Neck: no adenopathy, no carotid bruit, no JVD, supple, symmetrical, trachea midline, and thyroid not enlarged, symmetric, no tenderness/mass/nodules Back: symmetric, no curvature. ROM normal. No CVA tenderness. Resp: clear to auscultation bilaterally Cardio: regular rate and rhythm, S1, S2 normal, no murmur, click, rub or gallop GI: soft, non-tender; bowel sounds normal; no masses,  no organomegaly Extremities: extremities normal, atraumatic, no cyanosis or edema Pulses: 2+ and symmetric Skin: Skin color, texture, turgor normal. No rashes or lesions  Lab Results: Recent Labs    06/11/21 0514 06/12/21 0454  WBC 8.2 8.1  HGB 10.0* 7.3*  HCT 28.7* 20.3*  PLT 102* 140*   BMET Recent Labs    06/11/21 0514 06/12/21 0454  NA 140 140  K 5.6* 4.9  CL 112* 114*  CO2 21* 23  GLUCOSE 65* 99  BUN 19 20  CREATININE 1.17 1.20  CALCIUM 8.7* 8.8*    Studies/Results: No results found.  Medications: I have reviewed the  patient's current medications.  Assessment/Plan: A 55 year old gentleman admitted with sickle cell painful crisis and acute chest syndrome.  #1 sickle cell painful crisis: Appears to be much improved.  Patient is not complain of any significant pain at the moment.  Continue supportive care.  #2 acute chest syndrome: Continue both IV antibiotics.  Oxygenation.  Keep titrating oxygen off.  #3 anemia of chronic disease: Hemoglobin is down to 7.3.  Mostly hemodilution.  Continue to monitor  #4 thrombocytopenia: Platelets have rebounded already improved.  Continue monitoring  #5 chronic pain syndrome: Continue long-acting medications from home.  #6 tobacco abuse: Counseling provided.  Continue with nicotine patch  LOS: 2 days   Tonette Koehne,LAWAL 06/12/2021, 4:49 PM

## 2021-06-13 ENCOUNTER — Other Ambulatory Visit: Payer: Self-pay | Admitting: Internal Medicine

## 2021-06-13 ENCOUNTER — Other Ambulatory Visit: Payer: Self-pay | Admitting: Family Medicine

## 2021-06-13 ENCOUNTER — Other Ambulatory Visit (HOSPITAL_COMMUNITY): Payer: Self-pay

## 2021-06-13 ENCOUNTER — Ambulatory Visit: Payer: Self-pay | Admitting: Family Medicine

## 2021-06-13 ENCOUNTER — Telehealth: Payer: Self-pay

## 2021-06-13 DIAGNOSIS — D5700 Hb-SS disease with crisis, unspecified: Secondary | ICD-10-CM | POA: Diagnosis not present

## 2021-06-13 DIAGNOSIS — D5701 Hb-SS disease with acute chest syndrome: Secondary | ICD-10-CM | POA: Diagnosis not present

## 2021-06-13 DIAGNOSIS — D571 Sickle-cell disease without crisis: Secondary | ICD-10-CM

## 2021-06-13 DIAGNOSIS — J9601 Acute respiratory failure with hypoxia: Secondary | ICD-10-CM | POA: Diagnosis not present

## 2021-06-13 DIAGNOSIS — G894 Chronic pain syndrome: Secondary | ICD-10-CM

## 2021-06-13 LAB — CBC
HCT: 19.9 % — ABNORMAL LOW (ref 39.0–52.0)
Hemoglobin: 7.2 g/dL — ABNORMAL LOW (ref 13.0–17.0)
MCH: 30.5 pg (ref 26.0–34.0)
MCHC: 36.2 g/dL — ABNORMAL HIGH (ref 30.0–36.0)
MCV: 84.3 fL (ref 80.0–100.0)
Platelets: 139 10*3/uL — ABNORMAL LOW (ref 150–400)
RBC: 2.36 MIL/uL — ABNORMAL LOW (ref 4.22–5.81)
RDW: 22.8 % — ABNORMAL HIGH (ref 11.5–15.5)
WBC: 7.1 10*3/uL (ref 4.0–10.5)
nRBC: 11.5 % — ABNORMAL HIGH (ref 0.0–0.2)

## 2021-06-13 LAB — TYPE AND SCREEN
ABO/RH(D): O POS
Antibody Screen: NEGATIVE

## 2021-06-13 LAB — BASIC METABOLIC PANEL
Anion gap: 5 (ref 5–15)
BUN: 18 mg/dL (ref 6–20)
CO2: 24 mmol/L (ref 22–32)
Calcium: 9 mg/dL (ref 8.9–10.3)
Chloride: 111 mmol/L (ref 98–111)
Creatinine, Ser: 1.01 mg/dL (ref 0.61–1.24)
GFR, Estimated: >60 mL/min (ref >60)
Glucose, Bld: 102 mg/dL — ABNORMAL HIGH (ref 70–99)
Potassium: 4.4 mmol/L (ref 3.5–5.1)
Sodium: 140 mmol/L (ref 135–145)

## 2021-06-13 MED ORDER — AZITHROMYCIN 500 MG PO TABS
500.0000 mg | ORAL_TABLET | Freq: Every day | ORAL | 0 refills | Status: AC
  Filled 2021-06-13: qty 3, 3d supply, fill #0

## 2021-06-13 NOTE — Progress Notes (Signed)
Pt. Was up and ready to go first thing. Pt. Declined morning assess and medications from nurse. Let nurse check his oxygen levels, 97% on room air. Pt. PIV was removed, D/C instruction given to pt. Pt. Verbalized understanding. Pt. Walked off unit with steady gait with girlfriend at his side.

## 2021-06-13 NOTE — Progress Notes (Signed)
Waste 11mg /mL of dilaudid PCA.

## 2021-06-13 NOTE — Discharge Summary (Signed)
Physician Discharge Summary  Patient ID: Scott Norton MRN: [id number] DOB/AGE: Jun 16, 1966 55 y.o.  Admit date: 06/09/2021 Discharge date: 06/13/2021  Admission Diagnoses:  Discharge Diagnoses:  Active Problems:   Sickle cell anemia (HCC)   Acute hypoxemic respiratory failure (HCC)   Sickle cell anemia with crisis [facility])   Discharged Condition: good  Hospital Course: Patient is a 55 year old gentleman who is slightly homeless but with sickle cell crisis was admitted with sickle cell painful crisis.  Patient has been hypoxic with acute chest syndrome.  Initiated on oxygen and IV Rocephin and Zithromax.  Patient has done better.  Titrated off oxygen today.  Patient being discharged home on oral azithromycin.  Sickle cell painful crisis has also improved.  Consults: None  Significant Diagnostic Studies: labs: Serial CBCs and CMP is checked.  Did not require any transfusion  Treatments: IV hydration, antibiotics: ceftriaxone and azithromycin, and analgesia: acetaminophen and Dilaudid  Discharge Exam: Blood pressure 117/68, pulse 65, temperature 97.9 F (36.6 C), temperature source Oral, resp. rate 14, height 6\' 3"  (1.905 m), weight 73.5 kg, SpO2 97 %. General appearance: alert, cooperative, appears stated age, and no distress Resp: clear to auscultation bilaterally Cardio: regular rate and rhythm, S1, S2 normal, no murmur, click, rub or gallop GI: soft, non-tender; bowel sounds normal; no masses,  no organomegaly Extremities: extremities normal, atraumatic, no cyanosis or edema Pulses: 2+ and symmetric Skin: Skin color, texture, turgor normal. No rashes or lesions Neurologic: Grossly normal  Disposition: Discharge disposition: 01-Home or Self Care       Discharge Instructions     Diet - low sodium heart healthy   Complete by: As directed    Increase activity slowly   Complete by: As directed       Allergies as of 06/13/2021       Reactions   Aspirin Other (See  Comments)   Increased heart rate   Tape Other (See Comments)   rash   Tramadol Hives, Itching        Medication List     TAKE these medications    azithromycin 500 MG tablet Commonly known as: Zithromax Take 1 tablet (500 mg total) by mouth daily for 3 days. Take 1 tablet daily for 3 days.   folic acid 1 MG tablet Commonly known as: FOLVITE Take 1 tablet (1 mg total) by mouth daily.   ibuprofen 800 MG tablet Commonly known as: ADVIL Take 1 tablet (800 mg total) by mouth every 8 (eight) hours as needed.   oxyCODONE-acetaminophen 10-325 MG tablet Commonly known as: Percocet Take 1 tablet by mouth every 6 (six) hours as needed for up to 15 days for pain.         Signed8/25/2022 06/13/2021, 8:19 AM  Time spent 34 minutes

## 2021-06-13 NOTE — Telephone Encounter (Signed)
Percocet

## 2021-06-14 ENCOUNTER — Telehealth: Payer: Self-pay

## 2021-06-14 ENCOUNTER — Other Ambulatory Visit: Payer: Self-pay | Admitting: Internal Medicine

## 2021-06-14 ENCOUNTER — Other Ambulatory Visit (HOSPITAL_COMMUNITY): Payer: Self-pay

## 2021-06-14 DIAGNOSIS — D571 Sickle-cell disease without crisis: Secondary | ICD-10-CM

## 2021-06-14 DIAGNOSIS — G894 Chronic pain syndrome: Secondary | ICD-10-CM

## 2021-06-14 MED ORDER — OXYCODONE-ACETAMINOPHEN 10-325 MG PO TABS
1.0000 | ORAL_TABLET | Freq: Four times a day (QID) | ORAL | 0 refills | Status: DC | PRN
  Filled 2021-06-14: qty 60, 15d supply, fill #0

## 2021-06-14 NOTE — Telephone Encounter (Signed)
Percocet

## 2021-06-15 LAB — CULTURE, BLOOD (ROUTINE X 2)
Culture: NO GROWTH
Special Requests: ADEQUATE

## 2021-06-24 ENCOUNTER — Other Ambulatory Visit: Payer: Self-pay | Admitting: Family Medicine

## 2021-06-27 ENCOUNTER — Other Ambulatory Visit: Payer: Self-pay | Admitting: Internal Medicine

## 2021-06-27 ENCOUNTER — Other Ambulatory Visit (HOSPITAL_COMMUNITY): Payer: Self-pay

## 2021-06-27 DIAGNOSIS — D571 Sickle-cell disease without crisis: Secondary | ICD-10-CM

## 2021-06-27 DIAGNOSIS — G894 Chronic pain syndrome: Secondary | ICD-10-CM

## 2021-06-28 ENCOUNTER — Other Ambulatory Visit: Payer: Self-pay | Admitting: Family Medicine

## 2021-06-28 ENCOUNTER — Other Ambulatory Visit (HOSPITAL_COMMUNITY): Payer: Self-pay

## 2021-06-28 DIAGNOSIS — D571 Sickle-cell disease without crisis: Secondary | ICD-10-CM

## 2021-06-28 DIAGNOSIS — G894 Chronic pain syndrome: Secondary | ICD-10-CM

## 2021-06-28 MED ORDER — IBUPROFEN 800 MG PO TABS
800.0000 mg | ORAL_TABLET | Freq: Three times a day (TID) | ORAL | 1 refills | Status: DC | PRN
  Filled 2021-06-28: qty 60, 20d supply, fill #0
  Filled 2021-07-24: qty 60, 20d supply, fill #1

## 2021-06-28 MED ORDER — OXYCODONE-ACETAMINOPHEN 10-325 MG PO TABS
1.0000 | ORAL_TABLET | Freq: Four times a day (QID) | ORAL | 0 refills | Status: DC | PRN
Start: 2021-06-29 — End: 2021-07-13
  Filled 2021-06-29: qty 60, 15d supply, fill #0

## 2021-06-28 NOTE — Progress Notes (Signed)
Reviewed PDMP substance reporting system prior to prescribing opiate medications. No inconsistencies noted.  Meds ordered this encounter  Medications   oxyCODONE-acetaminophen (PERCOCET) 10-325 MG tablet    Sig: Take 1 tablet by mouth every 6 (six) hours as needed for up to 15 days for pain.    Dispense:  60 tablet    Refill:  0    Order Specific Question:   Supervising Provider    Answer:   JEGEDE, OLUGBEMIGA E [[id number]]   Vincient Vanaman Moore Mariell Nester  APRN, MSN, FNP-C [facility] [address]  [place], [place] [address] [phone]  

## 2021-06-29 ENCOUNTER — Other Ambulatory Visit (HOSPITAL_COMMUNITY): Payer: Self-pay

## 2021-07-11 ENCOUNTER — Other Ambulatory Visit (HOSPITAL_COMMUNITY): Payer: Self-pay

## 2021-07-11 ENCOUNTER — Other Ambulatory Visit: Payer: Self-pay | Admitting: Family Medicine

## 2021-07-11 DIAGNOSIS — G894 Chronic pain syndrome: Secondary | ICD-10-CM

## 2021-07-11 DIAGNOSIS — D571 Sickle-cell disease without crisis: Secondary | ICD-10-CM

## 2021-07-13 ENCOUNTER — Other Ambulatory Visit: Payer: Self-pay | Admitting: Family Medicine

## 2021-07-13 ENCOUNTER — Other Ambulatory Visit (HOSPITAL_COMMUNITY): Payer: Self-pay

## 2021-07-13 DIAGNOSIS — G894 Chronic pain syndrome: Secondary | ICD-10-CM

## 2021-07-13 DIAGNOSIS — D571 Sickle-cell disease without crisis: Secondary | ICD-10-CM

## 2021-07-13 MED ORDER — OXYCODONE-ACETAMINOPHEN 10-325 MG PO TABS
1.0000 | ORAL_TABLET | Freq: Four times a day (QID) | ORAL | 0 refills | Status: DC | PRN
  Filled 2021-07-13 (×2): qty 60, 15d supply, fill #0

## 2021-07-13 NOTE — Progress Notes (Signed)
Reviewed PDMP substance reporting system prior to prescribing opiate medications. No inconsistencies noted.  Meds ordered this encounter  Medications   oxyCODONE-acetaminophen (PERCOCET) 10-325 MG tablet    Sig: Take 1 tablet by mouth every 6 (six) hours as needed for up to 15 days for pain.    Dispense:  60 tablet    Refill:  0    Order Specific Question:   Supervising Provider    Answer:   JEGEDE, OLUGBEMIGA E [[id number]]   Scott Norton Scott Gasparini  APRN, MSN, FNP-C [facility] [address]  [place], [place] [address] [phone]  

## 2021-07-23 ENCOUNTER — Ambulatory Visit: Payer: Self-pay

## 2021-07-24 ENCOUNTER — Other Ambulatory Visit (HOSPITAL_COMMUNITY): Payer: Self-pay

## 2021-07-24 ENCOUNTER — Other Ambulatory Visit: Payer: Self-pay | Admitting: Family Medicine

## 2021-07-24 DIAGNOSIS — D571 Sickle-cell disease without crisis: Secondary | ICD-10-CM

## 2021-07-24 DIAGNOSIS — G894 Chronic pain syndrome: Secondary | ICD-10-CM

## 2021-07-24 MED ORDER — OXYCODONE-ACETAMINOPHEN 10-325 MG PO TABS
1.0000 | ORAL_TABLET | Freq: Four times a day (QID) | ORAL | 0 refills | Status: DC | PRN
  Filled 2021-07-28: qty 60, 15d supply, fill #0

## 2021-07-24 NOTE — Progress Notes (Signed)
Reviewed PDMP substance reporting system prior to prescribing opiate medications. No inconsistencies noted.  Meds ordered this encounter  Medications   oxyCODONE-acetaminophen (PERCOCET) 10-325 MG tablet    Sig: Take 1 tablet by mouth every 6 (six) hours as needed for up to 15 days for pain.    Dispense:  60 tablet    Refill:  0    Order Specific Question:   Supervising Provider    Answer:   JEGEDE, OLUGBEMIGA E [[id number]]   Phong Isenberg Moore Marylou Wages  APRN, MSN, FNP-C [facility] [address]  [place], [place] [address] [phone]  

## 2021-07-28 ENCOUNTER — Other Ambulatory Visit (HOSPITAL_COMMUNITY): Payer: Self-pay

## 2021-08-07 ENCOUNTER — Other Ambulatory Visit: Payer: Self-pay | Admitting: Family Medicine

## 2021-08-07 ENCOUNTER — Other Ambulatory Visit (HOSPITAL_COMMUNITY): Payer: Self-pay

## 2021-08-07 DIAGNOSIS — G894 Chronic pain syndrome: Secondary | ICD-10-CM

## 2021-08-07 DIAGNOSIS — D571 Sickle-cell disease without crisis: Secondary | ICD-10-CM

## 2021-08-08 ENCOUNTER — Other Ambulatory Visit: Payer: Self-pay | Admitting: Family Medicine

## 2021-08-08 ENCOUNTER — Ambulatory Visit: Payer: Self-pay | Admitting: Family Medicine

## 2021-08-08 ENCOUNTER — Other Ambulatory Visit (HOSPITAL_COMMUNITY): Payer: Self-pay

## 2021-08-08 DIAGNOSIS — D571 Sickle-cell disease without crisis: Secondary | ICD-10-CM

## 2021-08-08 DIAGNOSIS — G894 Chronic pain syndrome: Secondary | ICD-10-CM

## 2021-08-08 MED ORDER — OXYCODONE-ACETAMINOPHEN 10-325 MG PO TABS
1.0000 | ORAL_TABLET | Freq: Four times a day (QID) | ORAL | 0 refills | Status: DC | PRN
  Filled 2021-08-11: qty 60, 15d supply, fill #0

## 2021-08-08 NOTE — Progress Notes (Signed)
Reviewed PDMP substance reporting system prior to prescribing opiate medications. No inconsistencies noted.  Meds ordered this encounter  Medications   oxyCODONE-acetaminophen (PERCOCET) 10-325 MG tablet    Sig: Take 1 tablet by mouth every 6 (six) hours as needed for up to 15 days for pain.    Dispense:  60 tablet    Refill:  0    Order Specific Question:   Supervising Provider    Answer:   JEGEDE, OLUGBEMIGA E [[id number]]   Scott Cozine Moore Johnchristopher Sarvis  APRN, MSN, FNP-C [facility] [address]  [place], [place] [address] [phone]  

## 2021-08-11 ENCOUNTER — Other Ambulatory Visit (HOSPITAL_COMMUNITY): Payer: Self-pay

## 2021-08-22 ENCOUNTER — Other Ambulatory Visit (HOSPITAL_COMMUNITY): Payer: Self-pay

## 2021-08-22 ENCOUNTER — Other Ambulatory Visit: Payer: Self-pay | Admitting: Family Medicine

## 2021-08-22 DIAGNOSIS — G894 Chronic pain syndrome: Secondary | ICD-10-CM

## 2021-08-22 DIAGNOSIS — D571 Sickle-cell disease without crisis: Secondary | ICD-10-CM

## 2021-08-22 MED ORDER — OXYCODONE-ACETAMINOPHEN 10-325 MG PO TABS
1.0000 | ORAL_TABLET | Freq: Four times a day (QID) | ORAL | 0 refills | Status: DC | PRN
Start: 2021-08-25 — End: 2021-09-07
  Filled 2021-08-25: qty 60, 15d supply, fill #0

## 2021-08-22 NOTE — Progress Notes (Signed)
Reviewed PDMP substance reporting system prior to prescribing opiate medications. No inconsistencies noted.  Meds ordered this encounter  Medications   oxyCODONE-acetaminophen (PERCOCET) 10-325 MG tablet    Sig: Take 1 tablet by mouth every 6 (six) hours as needed for up to 15 days for pain.    Dispense:  60 tablet    Refill:  0    Order Specific Question:   Supervising Provider    Answer:   JEGEDE, OLUGBEMIGA E [[id number]]   Scott Lepak Moore Coralyn Roselli  APRN, MSN, FNP-C [facility] [address]  [place], [place] [address] [phone]  

## 2021-08-25 ENCOUNTER — Other Ambulatory Visit (HOSPITAL_COMMUNITY): Payer: Self-pay

## 2021-08-25 ENCOUNTER — Other Ambulatory Visit: Payer: Self-pay | Admitting: Family Medicine

## 2021-08-25 MED ORDER — IBUPROFEN 800 MG PO TABS
800.0000 mg | ORAL_TABLET | Freq: Three times a day (TID) | ORAL | 1 refills | Status: DC | PRN
  Filled 2021-08-25 – 2021-09-07 (×2): qty 60, 20d supply, fill #0
  Filled 2021-10-02: qty 60, 20d supply, fill #1

## 2021-09-02 ENCOUNTER — Other Ambulatory Visit (HOSPITAL_COMMUNITY): Payer: Self-pay

## 2021-09-07 ENCOUNTER — Other Ambulatory Visit (HOSPITAL_COMMUNITY): Payer: Self-pay

## 2021-09-07 ENCOUNTER — Ambulatory Visit (INDEPENDENT_AMBULATORY_CARE_PROVIDER_SITE_OTHER): Payer: Medicare HMO | Admitting: Family Medicine

## 2021-09-07 ENCOUNTER — Encounter: Payer: Self-pay | Admitting: Family Medicine

## 2021-09-07 ENCOUNTER — Other Ambulatory Visit: Payer: Self-pay

## 2021-09-07 VITALS — BP 151/69 | HR 78 | Temp 97.9°F | Ht 75.0 in | Wt 166.2 lb

## 2021-09-07 DIAGNOSIS — G894 Chronic pain syndrome: Secondary | ICD-10-CM | POA: Diagnosis not present

## 2021-09-07 DIAGNOSIS — J0100 Acute maxillary sinusitis, unspecified: Secondary | ICD-10-CM | POA: Diagnosis not present

## 2021-09-07 DIAGNOSIS — E559 Vitamin D deficiency, unspecified: Secondary | ICD-10-CM | POA: Diagnosis not present

## 2021-09-07 DIAGNOSIS — D571 Sickle-cell disease without crisis: Secondary | ICD-10-CM | POA: Diagnosis not present

## 2021-09-07 LAB — POCT URINALYSIS DIP (CLINITEK)
Bilirubin, UA: NEGATIVE
Glucose, UA: NEGATIVE mg/dL
Ketones, POC UA: NEGATIVE mg/dL
Leukocytes, UA: NEGATIVE
Nitrite, UA: NEGATIVE
POC PROTEIN,UA: 100 — AB
Spec Grav, UA: 1.01 (ref 1.010–1.025)
Urobilinogen, UA: 1 E.U./dL
pH, UA: 5.5 (ref 5.0–8.0)

## 2021-09-07 MED ORDER — AMOXICILLIN-POT CLAVULANATE 875-125 MG PO TABS
1.0000 | ORAL_TABLET | Freq: Two times a day (BID) | ORAL | 0 refills | Status: DC
  Filled 2021-09-07: qty 20, 10d supply, fill #0

## 2021-09-07 MED ORDER — OXYCODONE-ACETAMINOPHEN 10-325 MG PO TABS
1.0000 | ORAL_TABLET | Freq: Four times a day (QID) | ORAL | 0 refills | Status: DC | PRN
Start: 2021-09-08 — End: 2021-09-22
  Filled 2021-09-08: qty 60, 15d supply, fill #0

## 2021-09-07 NOTE — Progress Notes (Signed)
Patient [facility]  Established Patient Office Visit  Subjective:  Patient ID: Scott Norton, male    DOB: 11/30/1965  Age: 55 y.o. MRN: [id number]  CC:  Chief Complaint  Patient presents with   Follow-up    Pt is here today for his follow up visit. Pt states that he has been feeling very fatigue.      HPI Scott Norton is a 55 year old male with a medical history significant for sickle cell disease, chronic pain syndrome, opiate dependence and tolerance, and tobacco dependence presents for a 16-monthfollow-up of chronic conditions.  Patient also endorses fatigue on today. Patient has a history of sickle cell disease and chronic pain syndrome.  Chronic pain syndrome has been well controlled on Percocet 10-325 mg every 6 hours as needed.  He states that his pain intensity is 5/10 primarily to his low back and lower extremities today.  Patient says that he has been tired over the past several days.  He attributes increase in pain and fatigue to changes in weather.  Also, he states that he has been spending a great deal of time outside.   He currently denies any dizziness, shortness of breath, chest pain, fever, chill, nausea, vomiting, or diarrhea.  Patient is also complaining of sinus pain over the past 2 weeks.  He endorses nasal congestion and headache.  He states that symptoms are consistent with past sinus infections.  He denies any fever, chills, chest pain.  He endorses some productive cough.  No ear pain today.  Past Medical History:  Diagnosis Date   AKI (acute kidney injury) ([place] 02/08/2021   Anemia    Depression    Heart murmur    Seasonal allergies    wool & grass   Sickle cell anemia (HCC)     Past Surgical History:  Procedure Laterality Date   NO PAST SURGERIES     ROOT CANAL      Family History  Problem Relation Age of Onset   Alcohol abuse Mother    Sickle cell trait Mother    Hypertension Mother    Alcohol abuse  Father    Sickle cell trait Father    Early death Father        due to alcoholism   Hypertension Father    Hypertension Sister    Hypertension Brother    Hypertension Maternal Grandmother    Hypertension Maternal Grandfather    Hypertension Paternal Grandmother    Hypertension Paternal Grandfather     Social History   Socioeconomic History   Marital status: Single    Spouse name: Not on file   Number of children: Not on file   Years of education: Not on file   Highest education level: Not on file  Occupational History   Not on file  Tobacco Use   Smoking status: Some Days    Packs/day: 0.50    Types: Cigars, Cigarettes   Smokeless tobacco: Never   Tobacco comments:    10 cigars per week.  Vaping Use   Vaping Use: Never used  Substance and Sexual Activity   Alcohol use: Not Currently   Drug use: Not Currently    Types: Marijuana, Cocaine   Sexual activity: Yes  Other Topics Concern   Not on file  Social History Narrative   Lives with Friends in [place] Working on housing for himself   On disability for his SS disease  No children, no stable relationship   Social Determinants of Radio broadcast assistant Strain: Not on file  Food Insecurity: Not on file  Transportation Needs: Not on file  Physical Activity: Not on file  Stress: Not on file  Social Connections: Not on file  Intimate Partner Violence: Not on file    Outpatient Medications Prior to Visit  Medication Sig Dispense Refill   folic acid (FOLVITE) 1 MG tablet Take 1 tablet (1 mg total) by mouth daily. 90 tablet 3   ibuprofen (ADVIL) 800 MG tablet Take 1 tablet (800 mg total) by mouth every 8 (eight) hours as needed. 60 tablet 1   oxyCODONE-acetaminophen (PERCOCET) 10-325 MG tablet Take 1 tablet by mouth every 6 (six) hours as needed for pain. 60 tablet 0   No facility-administered medications prior to visit.    Allergies  Allergen Reactions   Aspirin Other (See Comments)    Increased heart  rate   Tape Other (See Comments)    rash   Tramadol Hives and Itching    ROS Review of Systems  Constitutional: Negative.   HENT: Negative.    Eyes: Negative.   Respiratory: Negative.    Gastrointestinal: Negative.   Genitourinary: Negative.   Musculoskeletal:  Positive for arthralgias and back pain.  Skin: Negative.  Negative for wound.  Hematological: Negative.   Psychiatric/Behavioral: Negative.       Objective:    Physical Exam Constitutional:      Appearance: Normal appearance.  HENT:     Mouth/Throat:     Mouth: Mucous membranes are moist.  Eyes:     Pupils: Pupils are equal, round, and reactive to light.  Cardiovascular:     Rate and Rhythm: Normal rate and regular rhythm.     Pulses: Normal pulses.  Pulmonary:     Effort: Pulmonary effort is normal.  Abdominal:     General: Bowel sounds are normal.  Skin:    General: Skin is warm.  Neurological:     General: No focal deficit present.     Mental Status: He is alert. Mental status is at baseline.  Psychiatric:        Mood and Affect: Mood normal.        Behavior: Behavior normal.        Thought Content: Thought content normal.        Judgment: Judgment normal.    BP (!) 151/69   Pulse 78   Temp 97.9 F (36.6 C)   Ht 6' 3"  (1.905 m)   Wt 166 lb 3.2 oz (75.4 kg)   SpO2 95%   BMI 20.77 kg/m  Wt Readings from Last 3 Encounters:  09/07/21 166 lb 3.2 oz (75.4 kg)  06/09/21 162 lb (73.5 kg)  05/02/21 162 lb 0.8 oz (73.5 kg)     There are no preventive care reminders to display for this patient.   There are no preventive care reminders to display for this patient.  Lab Results  Component Value Date   TSH 1.730 03/01/2021   Lab Results  Component Value Date   WBC 7.1 09/07/2021   HGB 7.6 (L) 09/07/2021   HCT 22.3 (L) 09/07/2021   MCV 92 09/07/2021   PLT 197 09/07/2021   Lab Results  Component Value Date   NA 139 09/07/2021   K 4.9 09/07/2021   CO2 21 09/07/2021   GLUCOSE 80  09/07/2021   BUN 13 09/07/2021   CREATININE 1.15 09/07/2021   BILITOT 1.9 (H)  09/07/2021   ALKPHOS 207 (H) 09/07/2021   AST 52 (H) 09/07/2021   ALT 20 09/07/2021   PROT 6.9 09/07/2021   ALBUMIN 4.4 09/07/2021   CALCIUM 9.3 09/07/2021   ANIONGAP 5 06/13/2021   EGFR 76 09/07/2021   Lab Results  Component Value Date   CHOL 127 03/01/2021   Lab Results  Component Value Date   HDL 43 03/01/2021   Lab Results  Component Value Date   LDLCALC 66 03/01/2021   Lab Results  Component Value Date   TRIG 89 03/01/2021   Lab Results  Component Value Date   CHOLHDL 3.0 03/01/2021   Lab Results  Component Value Date   HGBA1C <4.2 (L) 03/01/2021      Assessment & Plan:   Problem List Items Addressed This Visit       Other   Vitamin D deficiency   Relevant Orders   Sickle Cell Panel (Completed)   Sickle cell anemia (HCC) - Primary   Relevant Medications   oxyCODONE-acetaminophen (PERCOCET) 10-325 MG tablet   Other Relevant Orders   POCT URINALYSIS DIP (CLINITEK) (Completed)   080223 11+Oxyco+Alc+Crt-Bund (Completed)   Sickle Cell Panel (Completed)   Chronic pain   Relevant Medications   oxyCODONE-acetaminophen (PERCOCET) 10-325 MG tablet   Other Relevant Orders   [id number] 11+Oxyco+Alc+Crt-Bund (Completed)   Other Visit Diagnoses     Subacute maxillary sinusitis       Relevant Medications   amoxicillin-clavulanate (AUGMENTIN) 875-125 MG tablet       Meds ordered this encounter  Medications   oxyCODONE-acetaminophen (PERCOCET) 10-325 MG tablet    Sig: Take 1 tablet by mouth every 6 (six) hours as needed for pain.    Dispense:  60 tablet    Refill:  0   amoxicillin-clavulanate (AUGMENTIN) 875-125 MG tablet    Sig: Take 1 tablet by mouth 2 times daily.    Dispense:  20 tablet    Refill:  0    Order Specific Question:   Supervising Provider    Answer:   Angelica Chessman E [[id number]]   5. Sickle cell disease without crisis ([place])  - POCT URINALYSIS DIP  (CLINITEK) - oxyCODONE-acetaminophen (PERCOCET) 10-325 MG tablet; Take 1 tablet by mouth every 6 (six) hours as needed for pain.  Dispense: 60 tablet; Refill: 0 - 110211 11+Oxyco+Alc+Crt-Bund - Sickle Cell Panel  2. Chronic pain syndrome Reviewed PDMP substance reporting system prior to prescribing opiate medications. No inconsistencies noted.   - oxyCODONE-acetaminophen (PERCOCET) 10-325 MG tablet; Take 1 tablet by mouth every 6 (six) hours as needed for pain.  Dispense: 60 tablet; Refill: 0 - [id number] 11+Oxyco+Alc+Crt-Bund  3. Subacute maxillary sinusitis  - amoxicillin-clavulanate (AUGMENTIN) 875-125 MG tablet; Take 1 tablet by mouth 2 times daily.  Dispense: 20 tablet; Refill: 0  4. Vitamin D deficiency  - Sickle Cell Panel  Follow-up: 3 months for follow up of sickle cell disease   Donia Pounds  APRN, MSN, FNP-C Patient [place] [address] [place], [place] [address] [phone]

## 2021-09-08 ENCOUNTER — Other Ambulatory Visit (HOSPITAL_COMMUNITY): Payer: Self-pay

## 2021-09-08 LAB — CMP14+CBC/D/PLT+FER+RETIC+V...
ALT: 20 IU/L (ref 0–44)
AST: 52 IU/L — ABNORMAL HIGH (ref 0–40)
Albumin/Globulin Ratio: 1.8 (ref 1.2–2.2)
Albumin: 4.4 g/dL (ref 3.8–4.9)
Alkaline Phosphatase: 207 IU/L — ABNORMAL HIGH (ref 44–121)
BUN/Creatinine Ratio: 11 (ref 9–20)
BUN: 13 mg/dL (ref 6–24)
Basophils Absolute: 0 10*3/uL (ref 0.0–0.2)
Basos: 0 %
Bilirubin Total: 1.9 mg/dL — ABNORMAL HIGH (ref 0.0–1.2)
CO2: 21 mmol/L (ref 20–29)
Calcium: 9.3 mg/dL (ref 8.7–10.2)
Chloride: 105 mmol/L (ref 96–106)
Creatinine, Ser: 1.15 mg/dL (ref 0.76–1.27)
EOS (ABSOLUTE): 0.2 10*3/uL (ref 0.0–0.4)
Eos: 3 %
Ferritin: 249 ng/mL (ref 30–400)
Globulin, Total: 2.5 g/dL (ref 1.5–4.5)
Glucose: 80 mg/dL (ref 70–99)
Hematocrit: 22.3 % — ABNORMAL LOW (ref 37.5–51.0)
Hemoglobin: 7.6 g/dL — ABNORMAL LOW (ref 13.0–17.7)
Immature Grans (Abs): 0 10*3/uL (ref 0.0–0.1)
Immature Granulocytes: 0 %
Lymphocytes Absolute: 2.9 10*3/uL (ref 0.7–3.1)
Lymphs: 40 %
MCH: 31.3 pg (ref 26.6–33.0)
MCHC: 34.1 g/dL (ref 31.5–35.7)
MCV: 92 fL (ref 79–97)
Monocytes Absolute: 1 10*3/uL — ABNORMAL HIGH (ref 0.1–0.9)
Monocytes: 14 %
NRBC: 5 % — ABNORMAL HIGH (ref 0–0)
Neutrophils Absolute: 3 10*3/uL (ref 1.4–7.0)
Neutrophils: 43 %
Platelets: 197 10*3/uL (ref 150–450)
Potassium: 4.9 mmol/L (ref 3.5–5.2)
RBC: 2.43 x10E6/uL — CL (ref 4.14–5.80)
RDW: 21.9 % — ABNORMAL HIGH (ref 11.6–15.4)
Retic Ct Pct: 7.2 % — ABNORMAL HIGH (ref 0.6–2.6)
Sodium: 139 mmol/L (ref 134–144)
Total Protein: 6.9 g/dL (ref 6.0–8.5)
Vit D, 25-Hydroxy: 15.1 ng/mL — ABNORMAL LOW (ref 30.0–100.0)
WBC: 7.1 10*3/uL (ref 3.4–10.8)
eGFR: 76 mL/min/{1.73_m2} (ref >59)

## 2021-09-09 LAB — DRUG SCREEN 764883 11+OXYCO+ALC+CRT-BUND
Amphetamines, Urine: NEGATIVE ng/mL
BENZODIAZ UR QL: NEGATIVE ng/mL
Barbiturate: NEGATIVE ng/mL
Cannabinoid Quant, Ur: NEGATIVE ng/mL
Cocaine (Metabolite): NEGATIVE ng/mL
Creatinine: 122 mg/dL (ref 20.0–300.0)
Ethanol: NEGATIVE %
Meperidine: NEGATIVE ng/mL
Methadone Screen, Urine: NEGATIVE ng/mL
OPIATE SCREEN URINE: NEGATIVE ng/mL
Oxycodone/Oxymorphone, Urine: NEGATIVE ng/mL
Phencyclidine: NEGATIVE ng/mL
Propoxyphene: NEGATIVE ng/mL
Tramadol: NEGATIVE ng/mL
pH, Urine: 5.5 (ref 4.5–8.9)

## 2021-09-21 ENCOUNTER — Other Ambulatory Visit (HOSPITAL_COMMUNITY): Payer: Self-pay

## 2021-09-21 ENCOUNTER — Other Ambulatory Visit: Payer: Self-pay | Admitting: Family Medicine

## 2021-09-21 DIAGNOSIS — G894 Chronic pain syndrome: Secondary | ICD-10-CM

## 2021-09-21 DIAGNOSIS — D571 Sickle-cell disease without crisis: Secondary | ICD-10-CM

## 2021-09-22 ENCOUNTER — Other Ambulatory Visit: Payer: Self-pay | Admitting: Family Medicine

## 2021-09-22 ENCOUNTER — Other Ambulatory Visit (HOSPITAL_COMMUNITY): Payer: Self-pay

## 2021-09-22 DIAGNOSIS — G894 Chronic pain syndrome: Secondary | ICD-10-CM

## 2021-09-22 DIAGNOSIS — D571 Sickle-cell disease without crisis: Secondary | ICD-10-CM

## 2021-09-22 MED ORDER — OXYCODONE-ACETAMINOPHEN 10-325 MG PO TABS
1.0000 | ORAL_TABLET | Freq: Four times a day (QID) | ORAL | 0 refills | Status: DC | PRN
Start: 2021-09-22 — End: 2021-10-05
  Filled 2021-09-22: qty 60, 15d supply, fill #0

## 2021-09-22 NOTE — Progress Notes (Signed)
Reviewed PDMP substance reporting system prior to prescribing opiate medications. No inconsistencies noted.  Meds ordered this encounter  Medications   oxyCODONE-acetaminophen (PERCOCET) 10-325 MG tablet    Sig: Take 1 tablet by mouth every 6 (six) hours as needed for pain.    Dispense:  60 tablet    Refill:  0    Order Specific Question:   Supervising Provider    Answer:   JEGEDE, OLUGBEMIGA E [[id number]]   Orlie Cundari Moore Raychell Holcomb  APRN, MSN, FNP-C [facility] [address]  [place], [place] [address] [phone]  

## 2021-10-02 ENCOUNTER — Other Ambulatory Visit: Payer: Self-pay | Admitting: Family Medicine

## 2021-10-02 DIAGNOSIS — D571 Sickle-cell disease without crisis: Secondary | ICD-10-CM

## 2021-10-02 DIAGNOSIS — G894 Chronic pain syndrome: Secondary | ICD-10-CM

## 2021-10-03 ENCOUNTER — Other Ambulatory Visit (HOSPITAL_COMMUNITY): Payer: Self-pay

## 2021-10-05 ENCOUNTER — Other Ambulatory Visit: Payer: Self-pay | Admitting: Family Medicine

## 2021-10-05 ENCOUNTER — Other Ambulatory Visit (HOSPITAL_COMMUNITY): Payer: Self-pay

## 2021-10-05 DIAGNOSIS — D571 Sickle-cell disease without crisis: Secondary | ICD-10-CM

## 2021-10-05 DIAGNOSIS — G894 Chronic pain syndrome: Secondary | ICD-10-CM

## 2021-10-05 MED ORDER — OXYCODONE-ACETAMINOPHEN 10-325 MG PO TABS
1.0000 | ORAL_TABLET | Freq: Four times a day (QID) | ORAL | 0 refills | Status: DC | PRN
  Filled 2021-10-06: qty 60, 15d supply, fill #0

## 2021-10-05 NOTE — Progress Notes (Signed)
Reviewed PDMP substance reporting system prior to prescribing opiate medications. No inconsistencies noted.  Meds ordered this encounter  Medications   oxyCODONE-acetaminophen (PERCOCET) 10-325 MG tablet    Sig: Take 1 tablet by mouth every 6 (six) hours as needed for pain.    Dispense:  60 tablet    Refill:  0    Order Specific Question:   Supervising Provider    Answer:   JEGEDE, OLUGBEMIGA E [[id number]]   Sabryn Preslar Moore Kathyjo Briere  APRN, MSN, FNP-C [facility] [address]  [place], [place] [address] [phone]  

## 2021-10-06 ENCOUNTER — Other Ambulatory Visit (HOSPITAL_COMMUNITY): Payer: Self-pay

## 2021-10-17 ENCOUNTER — Other Ambulatory Visit (HOSPITAL_COMMUNITY): Payer: Self-pay

## 2021-10-17 ENCOUNTER — Other Ambulatory Visit: Payer: Self-pay | Admitting: Family Medicine

## 2021-10-17 DIAGNOSIS — D571 Sickle-cell disease without crisis: Secondary | ICD-10-CM

## 2021-10-17 DIAGNOSIS — G894 Chronic pain syndrome: Secondary | ICD-10-CM

## 2021-10-17 MED ORDER — OXYCODONE-ACETAMINOPHEN 10-325 MG PO TABS
1.0000 | ORAL_TABLET | Freq: Four times a day (QID) | ORAL | 0 refills | Status: DC | PRN
Start: 2021-10-20 — End: 2021-10-30
  Filled 2021-10-20: qty 60, 15d supply, fill #0

## 2021-10-17 NOTE — Progress Notes (Signed)
Reviewed PDMP substance reporting system prior to prescribing opiate medications. No inconsistencies noted.  Meds ordered this encounter  Medications   oxyCODONE-acetaminophen (PERCOCET) 10-325 MG tablet    Sig: Take 1 tablet by mouth every 6 (six) hours as needed for pain.    Dispense:  60 tablet    Refill:  0    Order Specific Question:   Supervising Provider    Answer:   JEGEDE, OLUGBEMIGA E [[id number]]   Sean Malinowski Moore Niv Darley  APRN, MSN, FNP-C [facility] [address]  [place], [place] [address] [phone]  

## 2021-10-20 ENCOUNTER — Other Ambulatory Visit (HOSPITAL_COMMUNITY): Payer: Self-pay

## 2021-10-30 ENCOUNTER — Other Ambulatory Visit: Payer: Self-pay | Admitting: Family Medicine

## 2021-10-30 DIAGNOSIS — D571 Sickle-cell disease without crisis: Secondary | ICD-10-CM

## 2021-10-30 DIAGNOSIS — G894 Chronic pain syndrome: Secondary | ICD-10-CM

## 2021-10-30 DIAGNOSIS — J0100 Acute maxillary sinusitis, unspecified: Secondary | ICD-10-CM

## 2021-10-31 ENCOUNTER — Other Ambulatory Visit (HOSPITAL_COMMUNITY): Payer: Self-pay

## 2021-10-31 MED ORDER — OXYCODONE-ACETAMINOPHEN 10-325 MG PO TABS
1.0000 | ORAL_TABLET | Freq: Four times a day (QID) | ORAL | 0 refills | Status: AC | PRN
  Filled 2021-10-31 – 2021-11-02 (×2): qty 60, 15d supply, fill #0

## 2021-10-31 MED ORDER — IBUPROFEN 800 MG PO TABS
800.0000 mg | ORAL_TABLET | Freq: Three times a day (TID) | ORAL | 1 refills | Status: DC | PRN
  Filled 2021-10-31: qty 60, 20d supply, fill #0
  Filled 2021-11-30: qty 60, 20d supply, fill #1

## 2021-11-01 ENCOUNTER — Other Ambulatory Visit (HOSPITAL_COMMUNITY): Payer: Self-pay

## 2021-11-02 ENCOUNTER — Other Ambulatory Visit (HOSPITAL_COMMUNITY): Payer: Self-pay

## 2021-11-13 ENCOUNTER — Other Ambulatory Visit: Payer: Self-pay | Admitting: Internal Medicine

## 2021-11-13 DIAGNOSIS — G894 Chronic pain syndrome: Secondary | ICD-10-CM

## 2021-11-13 DIAGNOSIS — D571 Sickle-cell disease without crisis: Secondary | ICD-10-CM

## 2021-11-13 NOTE — Telephone Encounter (Signed)
Requested medication (s) are due for refill today: DUe 11/15/21  Requested medication (s) are on the active medication list: yes    Last refill: 10/31/21 #60  0 refills  Future visit scheduled  With   L.Hollis 12/14/21  Notes to clinic:    Not delegated, not with provider. Please review.  Requested Prescriptions  Pending Prescriptions Disp Refills   oxyCODONE-acetaminophen (PERCOCET) 10-325 MG tablet 60 tablet 0    Sig: Take 1 tablet by mouth every 6 hours as needed for pain. (10/20/21)     There is no refill protocol information for this order

## 2021-11-16 ENCOUNTER — Other Ambulatory Visit (HOSPITAL_COMMUNITY): Payer: Self-pay

## 2021-11-17 ENCOUNTER — Other Ambulatory Visit (HOSPITAL_COMMUNITY): Payer: Self-pay

## 2021-11-17 ENCOUNTER — Other Ambulatory Visit: Payer: Self-pay | Admitting: Family Medicine

## 2021-11-17 ENCOUNTER — Telehealth: Payer: Self-pay

## 2021-11-17 DIAGNOSIS — G894 Chronic pain syndrome: Secondary | ICD-10-CM

## 2021-11-17 DIAGNOSIS — D571 Sickle-cell disease without crisis: Secondary | ICD-10-CM

## 2021-11-17 MED ORDER — OXYCODONE-ACETAMINOPHEN 10-325 MG PO TABS
1.0000 | ORAL_TABLET | Freq: Four times a day (QID) | ORAL | 0 refills | Status: DC | PRN
  Filled 2021-11-17: qty 60, 15d supply, fill #0

## 2021-11-17 NOTE — Progress Notes (Signed)
Reviewed PDMP substance reporting system prior to prescribing opiate medications. No inconsistencies noted.  Meds ordered this encounter  Medications   oxyCODONE-acetaminophen (PERCOCET) 10-325 MG tablet    Sig: Take 1 tablet by mouth every 6 (six) hours as needed for pain.    Dispense:  60 tablet    Refill:  0    Order Specific Question:   Supervising Provider    Answer:   JEGEDE, OLUGBEMIGA E [[id number]]   Shanesha Bednarz Moore Edyn Qazi  APRN, MSN, FNP-C [facility] [address]  [place], [place] [address] [phone]  

## 2021-11-17 NOTE — Telephone Encounter (Signed)
Oxycodone  °

## 2021-11-26 ENCOUNTER — Other Ambulatory Visit: Payer: Self-pay | Admitting: Family Medicine

## 2021-11-26 DIAGNOSIS — G894 Chronic pain syndrome: Secondary | ICD-10-CM

## 2021-11-26 DIAGNOSIS — D571 Sickle-cell disease without crisis: Secondary | ICD-10-CM

## 2021-11-30 ENCOUNTER — Other Ambulatory Visit (HOSPITAL_COMMUNITY): Payer: Self-pay

## 2021-12-01 ENCOUNTER — Other Ambulatory Visit: Payer: Self-pay | Admitting: Family Medicine

## 2021-12-01 ENCOUNTER — Other Ambulatory Visit (HOSPITAL_COMMUNITY): Payer: Self-pay

## 2021-12-01 DIAGNOSIS — D571 Sickle-cell disease without crisis: Secondary | ICD-10-CM

## 2021-12-01 DIAGNOSIS — G894 Chronic pain syndrome: Secondary | ICD-10-CM

## 2021-12-01 MED ORDER — OXYCODONE-ACETAMINOPHEN 10-325 MG PO TABS
1.0000 | ORAL_TABLET | Freq: Four times a day (QID) | ORAL | 0 refills | Status: DC | PRN
  Filled 2021-12-02: qty 60, 15d supply, fill #0

## 2021-12-01 NOTE — Progress Notes (Signed)
Reviewed PDMP substance reporting system prior to prescribing opiate medications. No inconsistencies noted.  Meds ordered this encounter  Medications   oxyCODONE-acetaminophen (PERCOCET) 10-325 MG tablet    Sig: Take 1 tablet by mouth every 6 (six) hours as needed for pain.    Dispense:  60 tablet    Refill:  0    Order Specific Question:   Supervising Provider    Answer:   JEGEDE, OLUGBEMIGA E [[id number]]   Scott Sofia Moore Audine Mangione  APRN, MSN, FNP-C [facility] [address]  [place], [place] [address] [phone]  

## 2021-12-02 ENCOUNTER — Other Ambulatory Visit (HOSPITAL_COMMUNITY): Payer: Self-pay

## 2021-12-12 ENCOUNTER — Ambulatory Visit: Payer: Self-pay | Admitting: Family Medicine

## 2021-12-14 ENCOUNTER — Ambulatory Visit: Payer: Self-pay | Admitting: Family Medicine

## 2022-01-26 IMAGING — CR DG LUMBAR SPINE COMPLETE 4+V
5 series · 5 of 5 positions shown · non-contrast
Comparison: 03/12/2021

CLINICAL DATA: Back pain, lumbar spine pain

EXAM:
LUMBAR SPINE - COMPLETE 4+ VIEW

[t lumbar spine ap]
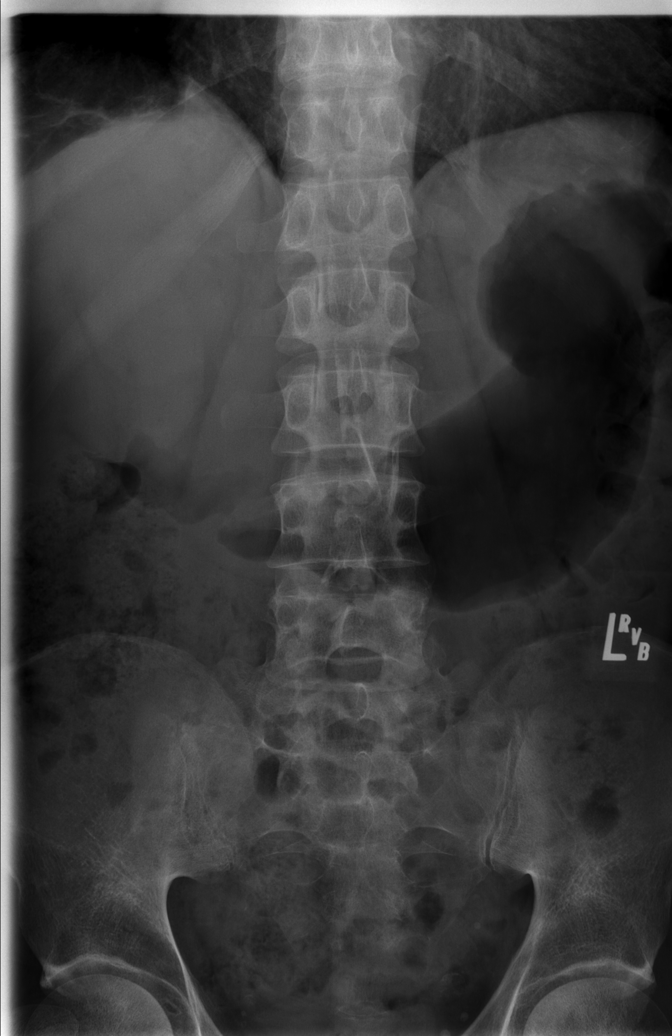

[t lumbar spine obl (1 of 2)]
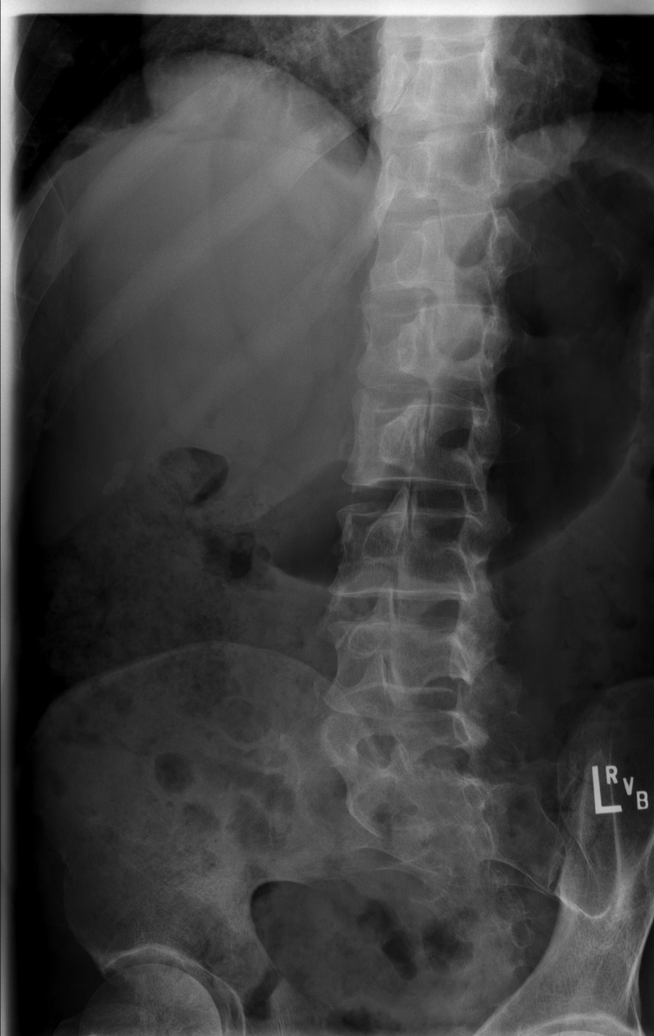

[t lumbar spine obl (2 of 2)]
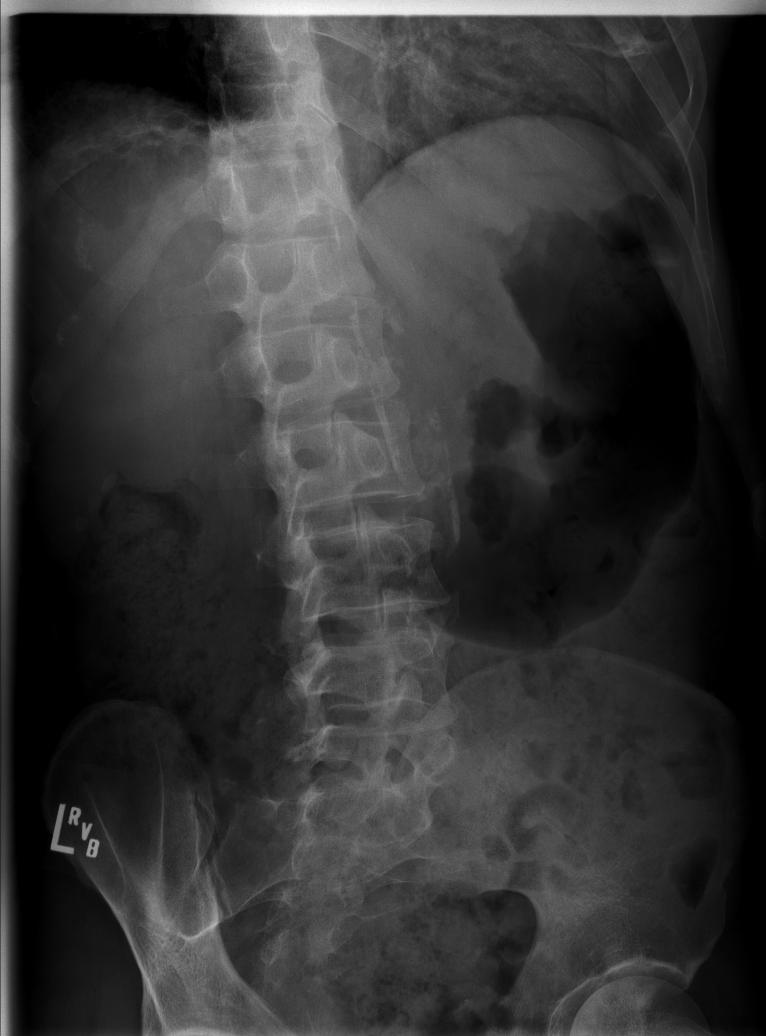

[t lumbar spine lat]
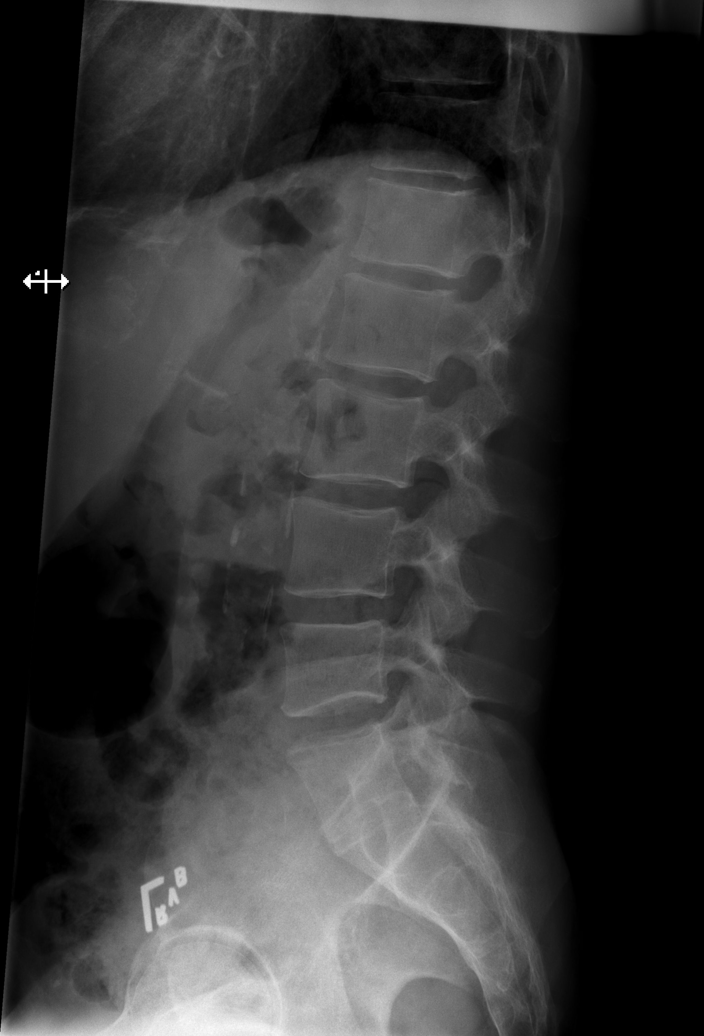

[t lumbar l-5 s-1 spot]
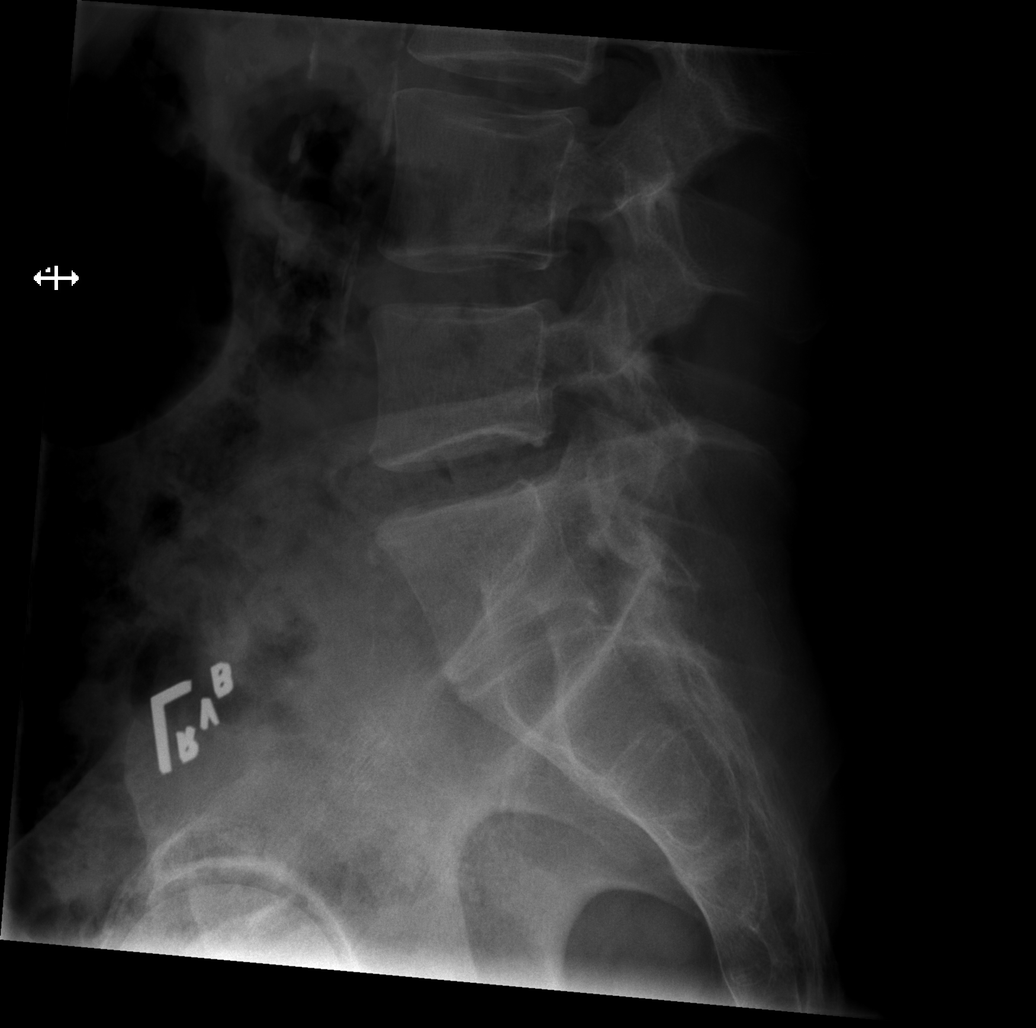

[5 of 5 positions shown; findings below may reference images not displayed]

FINDINGS: Normal lumbar lordosis. No acute fracture or listhesis of the lumbar
spine. Vertebral body height and intervertebral disc heights are
preserved. Vascular calcifications are seen within the abdominal
aorta anterior to the lumbar spine. The paraspinal soft tissues are
otherwise unremarkable.
IMPRESSION: Negative.

## 2022-03-08 ENCOUNTER — Other Ambulatory Visit (HOSPITAL_COMMUNITY): Payer: Self-pay

## 2022-03-26 ENCOUNTER — Other Ambulatory Visit: Payer: Self-pay | Admitting: Family Medicine

## 2022-03-26 ENCOUNTER — Other Ambulatory Visit (HOSPITAL_COMMUNITY): Payer: Self-pay

## 2022-03-26 ENCOUNTER — Other Ambulatory Visit: Payer: Self-pay | Admitting: Internal Medicine

## 2022-03-26 DIAGNOSIS — G894 Chronic pain syndrome: Secondary | ICD-10-CM

## 2022-03-26 DIAGNOSIS — D571 Sickle-cell disease without crisis: Secondary | ICD-10-CM

## 2022-03-26 MED ORDER — IBUPROFEN 800 MG PO TABS
800.0000 mg | ORAL_TABLET | Freq: Three times a day (TID) | ORAL | 1 refills | Status: DC | PRN
  Filled 2022-03-26: qty 60, 20d supply, fill #0
  Filled 2022-04-09: qty 60, 20d supply, fill #1

## 2022-03-26 MED ORDER — OXYCODONE-ACETAMINOPHEN 10-325 MG PO TABS
1.0000 | ORAL_TABLET | Freq: Four times a day (QID) | ORAL | 0 refills | Status: DC | PRN
  Filled 2022-03-26: qty 60, 15d supply, fill #0

## 2022-03-26 NOTE — Progress Notes (Signed)
Reviewed PDMP substance reporting system prior to prescribing opiate medications. No inconsistencies noted.  Meds ordered this encounter  Medications   oxyCODONE-acetaminophen (PERCOCET) 10-325 MG tablet    Sig: Take 1 tablet by mouth every 6 (six) hours as needed for pain.    Dispense:  60 tablet    Refill:  0    Order Specific Question:   Supervising Provider    Answer:   JEGEDE, OLUGBEMIGA E [[id number]]   Kristiann Noyce Moore Abygail Galeno  APRN, MSN, FNP-C [facility] [address]  [place], [place] [address] [phone]  

## 2022-04-06 ENCOUNTER — Other Ambulatory Visit: Payer: Self-pay | Admitting: Family Medicine

## 2022-04-06 ENCOUNTER — Other Ambulatory Visit (HOSPITAL_COMMUNITY): Payer: Self-pay

## 2022-04-06 DIAGNOSIS — G894 Chronic pain syndrome: Secondary | ICD-10-CM

## 2022-04-06 DIAGNOSIS — D571 Sickle-cell disease without crisis: Secondary | ICD-10-CM

## 2022-04-06 MED ORDER — OXYCODONE-ACETAMINOPHEN 10-325 MG PO TABS
1.0000 | ORAL_TABLET | Freq: Four times a day (QID) | ORAL | 0 refills | Status: DC | PRN
  Filled 2022-04-09: qty 60, 15d supply, fill #0

## 2022-04-06 NOTE — Progress Notes (Signed)
Reviewed PDMP substance reporting system prior to prescribing opiate medications. No inconsistencies noted.  Meds ordered this encounter  Medications   oxyCODONE-acetaminophen (PERCOCET) 10-325 MG tablet    Sig: Take 1 tablet by mouth every 6 (six) hours as needed for pain.    Dispense:  60 tablet    Refill:  0    Order Specific Question:   Supervising Provider    Answer:   JEGEDE, OLUGBEMIGA E [[id number]]   Johanny Segers Moore Alphonso Gregson  APRN, MSN, FNP-C [facility] [address]  [place], [place] [address] [phone]  

## 2022-04-09 ENCOUNTER — Other Ambulatory Visit (HOSPITAL_COMMUNITY): Payer: Self-pay

## 2022-04-20 ENCOUNTER — Other Ambulatory Visit: Payer: Self-pay | Admitting: Family Medicine

## 2022-04-20 DIAGNOSIS — D571 Sickle-cell disease without crisis: Secondary | ICD-10-CM

## 2022-04-20 DIAGNOSIS — G894 Chronic pain syndrome: Secondary | ICD-10-CM

## 2022-04-23 ENCOUNTER — Other Ambulatory Visit (HOSPITAL_COMMUNITY): Payer: Self-pay

## 2022-04-23 MED ORDER — AMOXICILLIN 500 MG PO CAPS
ORAL_CAPSULE | ORAL | 0 refills | Status: DC
  Filled 2022-04-23: qty 21, 7d supply, fill #0

## 2022-04-25 ENCOUNTER — Other Ambulatory Visit: Payer: Self-pay | Admitting: Family Medicine

## 2022-04-25 ENCOUNTER — Other Ambulatory Visit (HOSPITAL_COMMUNITY): Payer: Self-pay

## 2022-04-25 DIAGNOSIS — G894 Chronic pain syndrome: Secondary | ICD-10-CM

## 2022-04-25 DIAGNOSIS — D571 Sickle-cell disease without crisis: Secondary | ICD-10-CM

## 2022-04-26 ENCOUNTER — Other Ambulatory Visit (HOSPITAL_COMMUNITY): Payer: Self-pay

## 2022-05-03 ENCOUNTER — Other Ambulatory Visit (HOSPITAL_COMMUNITY): Payer: Self-pay

## 2022-05-03 ENCOUNTER — Ambulatory Visit (INDEPENDENT_AMBULATORY_CARE_PROVIDER_SITE_OTHER): Payer: Medicare HMO | Admitting: Family Medicine

## 2022-05-03 VITALS — BP 127/81 | Temp 98.0°F | Ht 75.0 in | Wt 172.1 lb

## 2022-05-03 DIAGNOSIS — E559 Vitamin D deficiency, unspecified: Secondary | ICD-10-CM | POA: Diagnosis not present

## 2022-05-03 DIAGNOSIS — G894 Chronic pain syndrome: Secondary | ICD-10-CM | POA: Diagnosis not present

## 2022-05-03 DIAGNOSIS — D571 Sickle-cell disease without crisis: Secondary | ICD-10-CM

## 2022-05-03 DIAGNOSIS — Z1159 Encounter for screening for other viral diseases: Secondary | ICD-10-CM | POA: Diagnosis not present

## 2022-05-03 DIAGNOSIS — Z1211 Encounter for screening for malignant neoplasm of colon: Secondary | ICD-10-CM

## 2022-05-03 MED ORDER — IBUPROFEN 800 MG PO TABS
800.0000 mg | ORAL_TABLET | Freq: Three times a day (TID) | ORAL | 1 refills | Status: DC | PRN
  Filled 2022-05-03: qty 60, 20d supply, fill #0
  Filled 2022-06-11: qty 60, 20d supply, fill #1

## 2022-05-03 MED ORDER — OXYCODONE-ACETAMINOPHEN 10-325 MG PO TABS
1.0000 | ORAL_TABLET | Freq: Four times a day (QID) | ORAL | 0 refills | Status: DC | PRN
  Filled 2022-05-03: qty 60, 15d supply, fill #0

## 2022-05-03 NOTE — Progress Notes (Signed)
Patient [name]   Established Patient Office Visit  Subjective   Patient ID: Scott Norton, male    DOB: 05-Sep-1966  Age: 56 y.o. MRN: [id number]  Chief Complaint  Patient presents with   Follow-up    Pt is here for follow up visit.    Scott Norton is a 56 year old male with a medical history significant for sickle cell disease, chronic pain syndrome, opiate dependence and tolerance, and history of anemia of chronic disease presents for follow-up of sickle cell disease.  Patient has been lost to follow-up for greater than 6 months due to incarceration.  He states that he has had increased pain primarily to low back and lower extremities.  Patient was previously on Percocet consistently for this pain, but has not been taking medication consistently.  He is also not on any disease modifying agents for his sickle cell disease at this time.  Patient typically has very well-controlled sickle cell disease with infrequent pain crises.  Patient has not been hospitalized for pain crisis over the past year. He currently denies headache, shortness of breath, urinary symptoms, nausea, vomiting, or diarrhea.     Patient Active Problem List   Diagnosis Date Noted   Acute hypoxemic respiratory failure ([place]) 06/10/2021   Sickle cell anemia with crisis ([place]) 06/10/2021   Suicidal ideation    Acute exacerbation of CHF (congestive heart failure) ([place]) 03/13/2021   Depression 03/01/2021   Cocaine abuse with cocaine-induced mood disorder ([place]) 03/01/2021   Sickle-cell crisis ([place]) 02/08/2021   Hyperkalemia 02/08/2021   Thrombocytopenia ([place]) 02/08/2021   Leukocytosis 02/08/2021   AKI (acute kidney injury) ([place]) 02/08/2021   Cocaine abuse ([place]) 02/08/2021   Homelessness 09/14/2020   Suicidal ideations 09/04/2020   Tobacco use 09/04/2020   Sickle cell pain crisis ([place]) 04/11/2015   Vitamin D deficiency 01/31/2015   Chronic pain 06/30/2014   Respiratory  infection 08/13/2012   Healthcare maintenance 06/04/2012   Cervical strain, acute 12/09/2011   Neck sprain and strain 12/09/2011   Sickle cell anemia (HCC) 11/29/2011   Seasonal allergies 11/29/2011   Seasonal allergic rhinitis 11/29/2011   Past Medical History:  Diagnosis Date   AKI (acute kidney injury) ([place]) 02/08/2021   Anemia    Depression    Heart murmur    Seasonal allergies    wool & grass   Sickle cell anemia (HCC)    Past Surgical History:  Procedure Laterality Date   NO PAST SURGERIES     ROOT CANAL     Social History   Tobacco Use   Smoking status: Some Days    Packs/day: 0.50    Types: Cigars, Cigarettes   Smokeless tobacco: Never   Tobacco comments:    10 cigars per week.  Vaping Use   Vaping Use: Never used  Substance Use Topics   Alcohol use: Not Currently   Drug use: Not Currently    Types: Marijuana, Cocaine   Social History   Socioeconomic History   Marital status: Single    Spouse name: Not on file   Number of children: Not on file   Years of education: Not on file   Highest education level: Not on file  Occupational History   Not on file  Tobacco Use   Smoking status: Some Days    Packs/day: 0.50    Types: Cigars, Cigarettes   Smokeless tobacco: Never   Tobacco comments:    10 cigars per week.  Vaping  Use   Vaping Use: Never used  Substance and Sexual Activity   Alcohol use: Not Currently   Drug use: Not Currently    Types: Marijuana, Cocaine   Sexual activity: Yes  Other Topics Concern   Not on file  Social History Narrative   Lives with Friends in [place]. Working on housing for himself   On disability for his SS disease      No children, no stable relationship   Social Determinants of Radio broadcast assistant Strain: Not on file  Food Insecurity: Not on file  Transportation Needs: Not on file  Physical Activity: Not on file  Stress: Not on file  Social Connections: Not on file  Intimate Partner Violence: Not on file    Family Status  Relation Name Status   Mother  Alive   Father  Deceased   Sister  (Not Specified)   Brother  (Not Specified)   MGM  (Not Specified)   MGF  (Not Specified)   PGM  (Not Specified)   PGF  (Not Specified)   Family History  Problem Relation Age of Onset   Alcohol abuse Mother    Sickle cell trait Mother    Hypertension Mother    Alcohol abuse Father    Sickle cell trait Father    Early death Father        due to alcoholism   Hypertension Father    Hypertension Sister    Hypertension Brother    Hypertension Maternal Grandmother    Hypertension Maternal Grandfather    Hypertension Paternal Grandmother    Hypertension Paternal Grandfather    Allergies  Allergen Reactions   Aspirin Other (See Comments)    Increased heart rate   Tape Other (See Comments)    rash   Tramadol Hives and Itching      Review of Systems  Constitutional: Negative.   HENT: Negative.    Eyes: Negative.   Respiratory: Negative.    Cardiovascular: Negative.   Gastrointestinal: Negative.   Genitourinary: Negative.   Musculoskeletal:  Positive for back pain and joint pain.  Skin: Negative.   Neurological: Negative.   Endo/Heme/Allergies: Negative.   Psychiatric/Behavioral: Negative.        Objective:     BP 127/81 (BP Location: Right Arm, Patient Position: Sitting, Cuff Size: Normal)   Temp 98 F (36.7 C)   Ht 6' 3"  (1.905 m)   Wt 172 lb 2 oz (78.1 kg)   BMI 21.51 kg/m  BP Readings from Last 3 Encounters:  05/03/22 127/81  09/07/21 (!) 151/69  06/13/21 117/68   Wt Readings from Last 3 Encounters:  05/03/22 172 lb 2 oz (78.1 kg)  09/07/21 166 lb 3.2 oz (75.4 kg)  06/09/21 162 lb (73.5 kg)      Physical Exam Eyes:     Pupils: Pupils are equal, round, and reactive to light.  Cardiovascular:     Rate and Rhythm: Normal rate and regular rhythm.     Pulses: Normal pulses. No decreased pulses.     Heart sounds: Murmur heard.  Pulmonary:     Effort: Pulmonary  effort is normal.  Abdominal:     General: Bowel sounds are normal.  Skin:    General: Skin is warm.  Neurological:     General: No focal deficit present.     Mental Status: Mental status is at baseline.  Psychiatric:        Mood and Affect: Mood normal.  Thought Content: Thought content normal.        Judgment: Judgment normal.      No results found for any visits on 05/03/22.  Last CBC Lab Results  Component Value Date   WBC 7.1 09/07/2021   HGB 7.6 (L) 09/07/2021   HCT 22.3 (L) 09/07/2021   MCV 92 09/07/2021   MCH 31.3 09/07/2021   RDW 21.9 (H) 09/07/2021   PLT 197 03/52/4818   Last metabolic panel Lab Results  Component Value Date   GLUCOSE 80 09/07/2021   NA 139 09/07/2021   K 4.9 09/07/2021   CL 105 09/07/2021   CO2 21 09/07/2021   BUN 13 09/07/2021   CREATININE 1.15 09/07/2021   EGFR 76 09/07/2021   CALCIUM 9.3 09/07/2021   PROT 6.9 09/07/2021   ALBUMIN 4.4 09/07/2021   LABGLOB 2.5 09/07/2021   AGRATIO 1.8 09/07/2021   BILITOT 1.9 (H) 09/07/2021   ALKPHOS 207 (H) 09/07/2021   AST 52 (H) 09/07/2021   ALT 20 09/07/2021   ANIONGAP 5 06/13/2021   Last lipids Lab Results  Component Value Date   CHOL 127 03/01/2021   HDL 43 03/01/2021   LDLCALC 66 03/01/2021   TRIG 89 03/01/2021   CHOLHDL 3.0 03/01/2021   Last hemoglobin A1c Lab Results  Component Value Date   HGBA1C <4.2 (L) 03/01/2021   Last thyroid functions Lab Results  Component Value Date   TSH 1.730 03/01/2021   Last vitamin D Lab Results  Component Value Date   VD25OH 15.1 (L) 09/07/2021   Last vitamin B12 and Folate No results found for: "VITAMINB12", "FOLATE"    The ASCVD Risk score (Arnett DK, et al., 2019) failed to calculate for the following reasons:   The valid total cholesterol range is 130 to 320 mg/dL    Assessment & Plan:   Problem List Items Addressed This Visit       Other   Vitamin D deficiency   Relevant Orders   Sickle Cell Panel (Completed)    Sickle cell anemia (HCC) - Primary   Relevant Orders   Sickle Cell Panel (Completed)   Chronic pain   Relevant Medications   ibuprofen (ADVIL) 800 MG tablet   Other Relevant Orders   [id number] 11+Oxyco+Alc+Crt-Bund (Completed)   Other Visit Diagnoses     Need for hepatitis C screening test       Relevant Orders   Hepatitis C Antibody (Completed)   Colon cancer screening       Relevant Orders   Ambulatory referral to Gastroenterology     Patient's Percocet has been restarted for management of chronic pain.  Also, restart folic acid and ibuprofen.  Patient will follow-up in 3 months.  Recommend prostate exam.  Also, referral sent for colon cancer screening.  Return in about 3 months (around 08/03/2022) for sickle cell anemia.   Donia Pounds  APRN, MSN, FNP-C Patient [place] [address] [place], [place] [address] [phone]

## 2022-05-03 NOTE — Patient Instructions (Signed)
Sickle Cell Anemia, Adult  Sickle cell anemia is a condition where your red blood cells are shaped like sickles. Red blood cells carry oxygen through the body. Sickle-shaped cells do not live as long as normal red blood cells. They also clump together and block blood from flowing through the blood vessels. This prevents the body from getting enough oxygen. Sickle cell anemia causes organ damage and pain. It also increases the risk of infection. Follow these instructions at home: Medicines Take over-the-counter and prescription medicines only as told by your doctor. If you were prescribed an antibiotic medicine, take it as told by your doctor. Do not stop taking the antibiotic even if you start to feel better. If you develop a fever, do not take medicines to lower the fever right away. Tell your doctor about the fever. Managing pain, stiffness, and swelling Try these methods to help with pain: Use a heating pad. Take a warm bath. Distract yourself, such as by watching TV. Eating and drinking Drink enough fluid to keep your pee (urine) clear or pale yellow. Drink more in hot weather and during exercise. Limit or avoid alcohol. Eat a healthy diet. Eat plenty of fruits, vegetables, whole grains, and lean protein. Take vitamins and supplements as told by your doctor. Traveling When traveling, keep these with you: Your medical information. The names of your doctors. Your medicines. If you need to take an airplane, talk to your doctor first. Activity Rest often. Avoid exercises that make your heart beat much faster, such as jogging. General instructions Do not use products that have nicotine or tobacco, such as cigarettes and e-cigarettes. If you need help quitting, ask your doctor. Consider wearing a medical alert bracelet. Avoid being in high places (high altitudes), such as mountains. Avoid very hot or cold temperatures. Avoid places where the temperature changes a lot. Keep all  follow-up visits as told by your doctor. This is important. Contact a doctor if: A joint hurts. Your feet or hands hurt or swell. You feel tired (fatigued). Get help right away if: You have symptoms of infection. These include: Fever. Chills. Being very tired. Irritability. Poor eating. Throwing up (vomiting). You feel dizzy or faint. You have new stomach pain, especially on the left side. You have a an erection (priapism) that lasts more than 4 hours. You have numbness in your arms or legs. You have a hard time moving your arms or legs. You have trouble talking. You have pain that does not go away when you take medicine. You are short of breath. You are breathing fast. You have a long-term cough. You have pain in your chest. You have a bad headache. You have a stiff neck. Your stomach looks bloated even though you did not eat much. Your skin is pale. You suddenly cannot see well. Summary Sickle cell anemia is a condition where your red blood cells are shaped like sickles. Follow your doctor's advice on ways to manage pain, food to eat, activities to do, and steps to take for safe travel. Get medical help right away if you have any signs of infection, such as a fever. This information is not intended to replace advice given to you by your health care provider. Make sure you discuss any questions you have with your health care provider. Document Revised: 02/28/2020 Document Reviewed: 03/03/2020 Elsevier Patient Education  2023 Elsevier Inc.  

## 2022-05-04 LAB — DRUG SCREEN 764883 11+OXYCO+ALC+CRT-BUND
Amphetamines, Urine: NEGATIVE ng/mL
BENZODIAZ UR QL: NEGATIVE ng/mL
Barbiturate: NEGATIVE ng/mL
Cannabinoid Quant, Ur: NEGATIVE ng/mL
Cocaine (Metabolite): NEGATIVE ng/mL
Creatinine: 69.3 mg/dL (ref 20.0–300.0)
Ethanol: NEGATIVE %
Meperidine: NEGATIVE ng/mL
Methadone Screen, Urine: NEGATIVE ng/mL
OPIATE SCREEN URINE: NEGATIVE ng/mL
Oxycodone/Oxymorphone, Urine: NEGATIVE ng/mL
Phencyclidine: NEGATIVE ng/mL
Propoxyphene: NEGATIVE ng/mL
Tramadol: NEGATIVE ng/mL
pH, Urine: 6 (ref 4.5–8.9)

## 2022-05-04 LAB — CMP14+CBC/D/PLT+FER+RETIC+V...
ALT: 20 IU/L (ref 0–44)
AST: 38 IU/L (ref 0–40)
Albumin/Globulin Ratio: 1.6 (ref 1.2–2.2)
Albumin: 4.5 g/dL (ref 3.8–4.9)
Alkaline Phosphatase: 249 IU/L — ABNORMAL HIGH (ref 44–121)
BUN/Creatinine Ratio: 13 (ref 9–20)
BUN: 15 mg/dL (ref 6–24)
Basophils Absolute: 0 10*3/uL (ref 0.0–0.2)
Basos: 0 %
Bilirubin Total: 1.5 mg/dL — ABNORMAL HIGH (ref 0.0–1.2)
CO2: 19 mmol/L — ABNORMAL LOW (ref 20–29)
Calcium: 9 mg/dL (ref 8.7–10.2)
Chloride: 104 mmol/L (ref 96–106)
Creatinine, Ser: 1.18 mg/dL (ref 0.76–1.27)
EOS (ABSOLUTE): 0.4 10*3/uL (ref 0.0–0.4)
Eos: 6 %
Ferritin: 63 ng/mL (ref 30–400)
Globulin, Total: 2.9 g/dL (ref 1.5–4.5)
Glucose: 86 mg/dL (ref 70–99)
Hematocrit: 22.6 % — ABNORMAL LOW (ref 37.5–51.0)
Hemoglobin: 7.6 g/dL — ABNORMAL LOW (ref 13.0–17.7)
Immature Grans (Abs): 0 10*3/uL (ref 0.0–0.1)
Immature Granulocytes: 0 %
Lymphocytes Absolute: 2.7 10*3/uL (ref 0.7–3.1)
Lymphs: 35 %
MCH: 27.7 pg (ref 26.6–33.0)
MCHC: 33.6 g/dL (ref 31.5–35.7)
MCV: 83 fL (ref 79–97)
Monocytes Absolute: 1 10*3/uL — ABNORMAL HIGH (ref 0.1–0.9)
Monocytes: 13 %
NRBC: 6 % — ABNORMAL HIGH (ref 0–0)
Neutrophils Absolute: 3.5 10*3/uL (ref 1.4–7.0)
Neutrophils: 46 %
Platelets: 210 10*3/uL (ref 150–450)
Potassium: 5.1 mmol/L (ref 3.5–5.2)
RBC: 2.74 x10E6/uL — CL (ref 4.14–5.80)
RDW: 26.2 % — ABNORMAL HIGH (ref 11.6–15.4)
Retic Ct Pct: 8.6 % — ABNORMAL HIGH (ref 0.6–2.6)
Sodium: 137 mmol/L (ref 134–144)
Total Protein: 7.4 g/dL (ref 6.0–8.5)
Vit D, 25-Hydroxy: 15.8 ng/mL — ABNORMAL LOW (ref 30.0–100.0)
WBC: 7.6 10*3/uL (ref 3.4–10.8)
eGFR: 73 mL/min/{1.73_m2} (ref >59)

## 2022-05-04 LAB — HEPATITIS C ANTIBODY: Hep C Virus Ab: NONREACTIVE

## 2022-05-14 ENCOUNTER — Other Ambulatory Visit: Payer: Self-pay | Admitting: Family Medicine

## 2022-05-14 DIAGNOSIS — G894 Chronic pain syndrome: Secondary | ICD-10-CM

## 2022-05-14 DIAGNOSIS — D571 Sickle-cell disease without crisis: Secondary | ICD-10-CM

## 2022-05-16 ENCOUNTER — Other Ambulatory Visit (HOSPITAL_COMMUNITY): Payer: Self-pay

## 2022-05-16 ENCOUNTER — Other Ambulatory Visit: Payer: Self-pay | Admitting: Family Medicine

## 2022-05-16 DIAGNOSIS — G894 Chronic pain syndrome: Secondary | ICD-10-CM

## 2022-05-16 DIAGNOSIS — D571 Sickle-cell disease without crisis: Secondary | ICD-10-CM

## 2022-05-16 MED ORDER — OXYCODONE-ACETAMINOPHEN 10-325 MG PO TABS
1.0000 | ORAL_TABLET | Freq: Four times a day (QID) | ORAL | 0 refills | Status: DC | PRN
  Filled 2022-05-17: qty 60, 15d supply, fill #0

## 2022-05-16 NOTE — Progress Notes (Unsigned)
Reviewed PDMP substance reporting system prior to prescribing opiate medications. No inconsistencies noted.  Meds ordered this encounter  Medications   oxyCODONE-acetaminophen (PERCOCET) 10-325 MG tablet    Sig: Take 1 tablet by mouth every 6 hours as needed for pain.    Dispense:  60 tablet    Refill:  0    Order Specific Question:   Supervising Provider    Answer:   Quentin Angst [id number]

## 2022-05-17 ENCOUNTER — Other Ambulatory Visit (HOSPITAL_COMMUNITY): Payer: Self-pay

## 2022-05-28 ENCOUNTER — Other Ambulatory Visit: Payer: Self-pay | Admitting: Family Medicine

## 2022-05-28 ENCOUNTER — Other Ambulatory Visit (HOSPITAL_COMMUNITY): Payer: Self-pay

## 2022-05-28 DIAGNOSIS — D571 Sickle-cell disease without crisis: Secondary | ICD-10-CM

## 2022-05-28 DIAGNOSIS — G894 Chronic pain syndrome: Secondary | ICD-10-CM

## 2022-05-28 MED ORDER — OXYCODONE-ACETAMINOPHEN 10-325 MG PO TABS
1.0000 | ORAL_TABLET | Freq: Four times a day (QID) | ORAL | 0 refills | Status: DC | PRN
  Filled 2022-05-31: qty 60, 15d supply, fill #0
  Filled ????-??-??: fill #0

## 2022-05-28 NOTE — Progress Notes (Signed)
Reviewed PDMP substance reporting system prior to prescribing opiate medications. No inconsistencies noted.  Meds ordered this encounter  Medications   oxyCODONE-acetaminophen (PERCOCET) 10-325 MG tablet    Sig: Take 1 tablet by mouth every 6 hours as needed for pain.    Dispense:  60 tablet    Refill:  0    Order Specific Question:   Supervising Provider    Answer:   JEGEDE, OLUGBEMIGA E [[id number]]   Scott Norton Khair Chasteen  APRN, MSN, FNP-C [facility] [address]  [place], [place] [address] [phone]  

## 2022-05-31 ENCOUNTER — Other Ambulatory Visit (HOSPITAL_COMMUNITY): Payer: Self-pay

## 2022-05-31 DIAGNOSIS — Z8249 Family history of ischemic heart disease and other diseases of the circulatory system: Secondary | ICD-10-CM | POA: Diagnosis not present

## 2022-05-31 DIAGNOSIS — D571 Sickle-cell disease without crisis: Secondary | ICD-10-CM | POA: Diagnosis not present

## 2022-05-31 DIAGNOSIS — Z833 Family history of diabetes mellitus: Secondary | ICD-10-CM | POA: Diagnosis not present

## 2022-05-31 DIAGNOSIS — Z008 Encounter for other general examination: Secondary | ICD-10-CM | POA: Diagnosis not present

## 2022-05-31 DIAGNOSIS — M1990 Unspecified osteoarthritis, unspecified site: Secondary | ICD-10-CM | POA: Diagnosis not present

## 2022-05-31 DIAGNOSIS — R69 Illness, unspecified: Secondary | ICD-10-CM | POA: Diagnosis not present

## 2022-05-31 DIAGNOSIS — Z809 Family history of malignant neoplasm, unspecified: Secondary | ICD-10-CM | POA: Diagnosis not present

## 2022-06-11 ENCOUNTER — Other Ambulatory Visit: Payer: Self-pay | Admitting: Family Medicine

## 2022-06-11 ENCOUNTER — Other Ambulatory Visit (HOSPITAL_COMMUNITY): Payer: Self-pay

## 2022-06-11 DIAGNOSIS — D571 Sickle-cell disease without crisis: Secondary | ICD-10-CM

## 2022-06-11 DIAGNOSIS — G894 Chronic pain syndrome: Secondary | ICD-10-CM

## 2022-06-11 MED ORDER — OXYCODONE-ACETAMINOPHEN 10-325 MG PO TABS
1.0000 | ORAL_TABLET | Freq: Four times a day (QID) | ORAL | 0 refills | Status: DC | PRN
  Filled 2022-06-11 – 2022-06-13 (×4): qty 60, 15d supply, fill #0

## 2022-06-12 ENCOUNTER — Other Ambulatory Visit (HOSPITAL_COMMUNITY): Payer: Self-pay

## 2022-06-13 ENCOUNTER — Other Ambulatory Visit (HOSPITAL_COMMUNITY): Payer: Self-pay

## 2022-06-19 ENCOUNTER — Other Ambulatory Visit (HOSPITAL_COMMUNITY): Payer: Self-pay

## 2022-06-25 ENCOUNTER — Other Ambulatory Visit: Payer: Self-pay | Admitting: Internal Medicine

## 2022-06-25 DIAGNOSIS — D571 Sickle-cell disease without crisis: Secondary | ICD-10-CM

## 2022-06-25 DIAGNOSIS — G894 Chronic pain syndrome: Secondary | ICD-10-CM

## 2022-06-27 ENCOUNTER — Other Ambulatory Visit: Payer: Self-pay | Admitting: Family Medicine

## 2022-06-27 ENCOUNTER — Other Ambulatory Visit (HOSPITAL_COMMUNITY): Payer: Self-pay

## 2022-06-27 ENCOUNTER — Other Ambulatory Visit: Payer: Self-pay | Admitting: Internal Medicine

## 2022-06-27 DIAGNOSIS — G894 Chronic pain syndrome: Secondary | ICD-10-CM

## 2022-06-27 DIAGNOSIS — D571 Sickle-cell disease without crisis: Secondary | ICD-10-CM

## 2022-06-27 MED ORDER — OXYCODONE-ACETAMINOPHEN 10-325 MG PO TABS
1.0000 | ORAL_TABLET | Freq: Four times a day (QID) | ORAL | 0 refills | Status: DC | PRN
  Filled 2022-06-27: qty 60, 15d supply, fill #0

## 2022-06-27 NOTE — Progress Notes (Signed)
Reviewed PDMP substance reporting system prior to prescribing opiate medications. No inconsistencies noted.  Meds ordered this encounter  Medications   oxyCODONE-acetaminophen (PERCOCET) 10-325 MG tablet    Sig: Take 1 tablet by mouth every 6 hours as needed for pain.    Dispense:  60 tablet    Refill:  0    Order Specific Question:   Supervising Provider    Answer:   JEGEDE, OLUGBEMIGA E [[id number]]   Adasha Boehme Moore Jordayn Mink  APRN, MSN, FNP-C [facility] [address]  [place], [place] [address] [phone]  

## 2022-07-08 ENCOUNTER — Other Ambulatory Visit (HOSPITAL_COMMUNITY): Payer: Self-pay

## 2022-07-08 ENCOUNTER — Other Ambulatory Visit: Payer: Self-pay | Admitting: Family Medicine

## 2022-07-08 DIAGNOSIS — D571 Sickle-cell disease without crisis: Secondary | ICD-10-CM

## 2022-07-08 DIAGNOSIS — G894 Chronic pain syndrome: Secondary | ICD-10-CM

## 2022-07-08 MED ORDER — OXYCODONE-ACETAMINOPHEN 10-325 MG PO TABS
1.0000 | ORAL_TABLET | Freq: Four times a day (QID) | ORAL | 0 refills | Status: DC | PRN
  Filled 2022-07-11: qty 60, 15d supply, fill #0

## 2022-07-08 NOTE — Progress Notes (Signed)
Reviewed PDMP substance reporting system prior to prescribing opiate medications. No inconsistencies noted.  Meds ordered this encounter  Medications   oxyCODONE-acetaminophen (PERCOCET) 10-325 MG tablet    Sig: Take 1 tablet by mouth every 6 hours as needed for pain.    Dispense:  60 tablet    Refill:  0    Order Specific Question:   Supervising Provider    Answer:   JEGEDE, OLUGBEMIGA E [[id number]]   Scott Norton Moore Kimarie Coor  APRN, MSN, FNP-C [facility] [address]  [place], [place] [address] [phone]  

## 2022-07-11 ENCOUNTER — Other Ambulatory Visit (HOSPITAL_COMMUNITY): Payer: Self-pay

## 2022-07-16 ENCOUNTER — Other Ambulatory Visit (HOSPITAL_COMMUNITY): Payer: Self-pay

## 2022-07-16 ENCOUNTER — Other Ambulatory Visit: Payer: Self-pay | Admitting: Family Medicine

## 2022-07-16 DIAGNOSIS — G894 Chronic pain syndrome: Secondary | ICD-10-CM

## 2022-07-17 ENCOUNTER — Other Ambulatory Visit (HOSPITAL_COMMUNITY): Payer: Self-pay

## 2022-07-17 MED ORDER — IBUPROFEN 800 MG PO TABS
800.0000 mg | ORAL_TABLET | Freq: Three times a day (TID) | ORAL | 1 refills | Status: DC | PRN
  Filled 2022-07-17: qty 60, 20d supply, fill #0
  Filled 2022-08-20: qty 60, 20d supply, fill #1

## 2022-07-20 ENCOUNTER — Other Ambulatory Visit (HOSPITAL_COMMUNITY): Payer: Self-pay

## 2022-07-23 ENCOUNTER — Other Ambulatory Visit: Payer: Self-pay | Admitting: Family Medicine

## 2022-07-23 DIAGNOSIS — G894 Chronic pain syndrome: Secondary | ICD-10-CM

## 2022-07-23 DIAGNOSIS — D571 Sickle-cell disease without crisis: Secondary | ICD-10-CM

## 2022-07-24 ENCOUNTER — Other Ambulatory Visit (HOSPITAL_COMMUNITY): Payer: Self-pay

## 2022-07-24 ENCOUNTER — Other Ambulatory Visit: Payer: Self-pay | Admitting: Family Medicine

## 2022-07-24 DIAGNOSIS — G894 Chronic pain syndrome: Secondary | ICD-10-CM

## 2022-07-24 DIAGNOSIS — D571 Sickle-cell disease without crisis: Secondary | ICD-10-CM

## 2022-07-24 MED ORDER — OXYCODONE-ACETAMINOPHEN 10-325 MG PO TABS
1.0000 | ORAL_TABLET | Freq: Four times a day (QID) | ORAL | 0 refills | Status: DC | PRN
  Filled 2022-07-25: qty 60, 15d supply, fill #0

## 2022-07-24 NOTE — Progress Notes (Signed)
Reviewed PDMP substance reporting system prior to prescribing opiate medications. No inconsistencies noted.  Meds ordered this encounter  Medications   oxyCODONE-acetaminophen (PERCOCET) 10-325 MG tablet    Sig: Take 1 tablet by mouth every 6 hours as needed for pain.    Dispense:  60 tablet    Refill:  0    Order Specific Question:   Supervising Provider    Answer:   JEGEDE, OLUGBEMIGA E [[id number]]   Scott Arrants Moore Orlen Leedy  APRN, MSN, FNP-C [facility] [address]  [place],  [address] [phone]  

## 2022-07-25 ENCOUNTER — Other Ambulatory Visit (HOSPITAL_COMMUNITY): Payer: Self-pay

## 2022-08-06 ENCOUNTER — Other Ambulatory Visit: Payer: Self-pay | Admitting: Family Medicine

## 2022-08-06 DIAGNOSIS — D571 Sickle-cell disease without crisis: Secondary | ICD-10-CM

## 2022-08-06 DIAGNOSIS — G894 Chronic pain syndrome: Secondary | ICD-10-CM

## 2022-08-07 ENCOUNTER — Other Ambulatory Visit (HOSPITAL_COMMUNITY): Payer: Self-pay

## 2022-08-07 ENCOUNTER — Encounter: Payer: Self-pay | Admitting: Family Medicine

## 2022-08-07 ENCOUNTER — Ambulatory Visit (INDEPENDENT_AMBULATORY_CARE_PROVIDER_SITE_OTHER): Payer: Medicare HMO | Admitting: Family Medicine

## 2022-08-07 VITALS — BP 133/60 | HR 82 | Temp 98.6°F | Ht 75.0 in | Wt 172.6 lb

## 2022-08-07 DIAGNOSIS — E559 Vitamin D deficiency, unspecified: Secondary | ICD-10-CM | POA: Diagnosis not present

## 2022-08-07 DIAGNOSIS — R051 Acute cough: Secondary | ICD-10-CM | POA: Diagnosis not present

## 2022-08-07 DIAGNOSIS — G894 Chronic pain syndrome: Secondary | ICD-10-CM

## 2022-08-07 DIAGNOSIS — D571 Sickle-cell disease without crisis: Secondary | ICD-10-CM

## 2022-08-07 MED ORDER — GUAIFENESIN ER 600 MG PO TB12
600.0000 mg | ORAL_TABLET | Freq: Two times a day (BID) | ORAL | 0 refills | Status: AC | PRN
  Filled 2022-08-07: qty 20, 10d supply, fill #0

## 2022-08-07 MED ORDER — OXYCODONE-ACETAMINOPHEN 10-325 MG PO TABS
1.0000 | ORAL_TABLET | Freq: Four times a day (QID) | ORAL | 0 refills | Status: DC | PRN
  Filled 2022-08-09: qty 60, 15d supply, fill #0
  Filled ????-??-??: fill #0

## 2022-08-07 NOTE — Progress Notes (Signed)
Established Patient Office Visit  Subjective   Patient ID: Scott Norton, male    DOB: August 14, 1966  Age: 56 y.o. MRN: [id number]  Chief Complaint  Patient presents with   Follow-up    Sunday started to cough and some mucus    Scott Norton is a 56 year old male with a medical history significant for sickle cell disease, chronic pain syndrome, opiate dependence and tolerance, and history of anemia of chronic disease presents for follow-up of sickle cell.  Patient states that he has been doing well.  His primary complaint is a productive cough over the past 2 days.  Patient also endorses runny nose.  He cannot recall any sick contacts and has not had any recent travel.  The patient is afebrile. Patient typically has pain daily primarily to low back and lower extremities.  Pain has been well controlled on current medication regimen. He denies headache, chest pain, urinary symptoms, nausea, vomiting, or diarrhea.    Patient Active Problem List   Diagnosis Date Noted   Acute hypoxemic respiratory failure ([place]) 06/10/2021   Sickle cell anemia with crisis ([place]) 06/10/2021   Suicidal ideation    Acute exacerbation of CHF (congestive heart failure) ([place]) 03/13/2021   Depression 03/01/2021   Cocaine abuse with cocaine-induced mood disorder ([place]) 03/01/2021   Sickle-cell crisis ([place]) 02/08/2021   Hyperkalemia 02/08/2021   Thrombocytopenia ([place]) 02/08/2021   Leukocytosis 02/08/2021   AKI (acute kidney injury) ([place]) 02/08/2021   Cocaine abuse ([place]) 02/08/2021   Homelessness 09/14/2020   Suicidal ideations 09/04/2020   Tobacco use 09/04/2020   Sickle cell pain crisis ([place]) 04/11/2015   Vitamin D deficiency 01/31/2015   Chronic pain 06/30/2014   Respiratory infection 08/13/2012   Healthcare maintenance 06/04/2012   Cervical strain, acute 12/09/2011   Neck sprain and strain 12/09/2011   Sickle cell anemia (HCC) 11/29/2011   Seasonal allergies 11/29/2011   Seasonal allergic rhinitis  11/29/2011   Past Medical History:  Diagnosis Date   AKI (acute kidney injury) ([place]) 02/08/2021   Anemia    Depression    Heart murmur    Seasonal allergies    wool & grass   Sickle cell anemia (HCC)    Past Surgical History:  Procedure Laterality Date   NO PAST SURGERIES     ROOT CANAL     Social History   Tobacco Use   Smoking status: Some Days    Packs/day: 0.50    Types: Cigars, Cigarettes   Smokeless tobacco: Never   Tobacco comments:    10 cigars per week.  Vaping Use   Vaping Use: Never used  Substance Use Topics   Alcohol use: Not Currently   Drug use: Not Currently    Types: Marijuana, Cocaine   Social History   Socioeconomic History   Marital status: Single    Spouse name: Not on file   Number of children: Not on file   Years of education: Not on file   Highest education level: Not on file  Occupational History   Not on file  Tobacco Use   Smoking status: Some Days    Packs/day: 0.50    Types: Cigars, Cigarettes   Smokeless tobacco: Never   Tobacco comments:    10 cigars per week.  Vaping Use   Vaping Use: Never used  Substance and Sexual Activity   Alcohol use: Not Currently   Drug use: Not Currently    Types: Marijuana, Cocaine   Sexual activity: Yes  Other Topics Concern  Not on file  Social History Narrative   Lives with Friends in [place]. Working on housing for himself   On disability for his SS disease      No children, no stable relationship   Social Determinants of Radio broadcast assistant Strain: Not on file  Food Insecurity: Not on file  Transportation Needs: Not on file  Physical Activity: Not on file  Stress: Not on file  Social Connections: Not on file  Intimate Partner Violence: Not on file   Family Status  Relation Name Status   Mother  Alive   Father  Deceased   Sister  (Not Specified)   Brother  (Not Specified)   MGM  (Not Specified)   MGF  (Not Specified)   PGM  (Not Specified)   PGF  (Not Specified)    Family History  Problem Relation Age of Onset   Alcohol abuse Mother    Sickle cell trait Mother    Hypertension Mother    Alcohol abuse Father    Sickle cell trait Father    Early death Father        due to alcoholism   Hypertension Father    Hypertension Sister    Hypertension Brother    Hypertension Maternal Grandmother    Hypertension Maternal Grandfather    Hypertension Paternal Grandmother    Hypertension Paternal Grandfather    Allergies  Allergen Reactions   Aspirin Other (See Comments)    Increased heart rate   Tape Other (See Comments)    rash   Tramadol Hives and Itching      Review of Systems  Constitutional:  Negative for chills and fever.  HENT: Negative.    Eyes: Negative.   Respiratory: Negative.    Cardiovascular: Negative.   Gastrointestinal: Negative.   Genitourinary: Negative.   Musculoskeletal:  Positive for back pain and joint pain.  Skin: Negative.   Neurological: Negative.   Psychiatric/Behavioral: Negative.        Objective:     BP 133/60   Pulse 82   Temp 98.6 F (37 C)   Ht 6' 3"  (1.905 m)   Wt 172 lb 9.6 oz (78.3 kg)   SpO2 96%   BMI 21.57 kg/m  BP Readings from Last 3 Encounters:  08/07/22 133/60  05/03/22 127/81  09/07/21 (!) 151/69   Wt Readings from Last 3 Encounters:  08/07/22 172 lb 9.6 oz (78.3 kg)  05/03/22 172 lb 2 oz (78.1 kg)  09/07/21 166 lb 3.2 oz (75.4 kg)      Physical Exam Constitutional:      Appearance: Normal appearance.  Eyes:     Pupils: Pupils are equal, round, and reactive to light.  Cardiovascular:     Rate and Rhythm: Normal rate and regular rhythm.     Pulses: Normal pulses.  Pulmonary:     Effort: Pulmonary effort is normal.  Abdominal:     General: Bowel sounds are normal.  Musculoskeletal:        General: Normal range of motion.  Skin:    General: Skin is warm.  Neurological:     General: No focal deficit present.     Mental Status: He is alert. Mental status is at  baseline.  Psychiatric:        Mood and Affect: Mood normal.        Behavior: Behavior normal.        Thought Content: Thought content normal.        Judgment:  Judgment normal.      No results found for any visits on 08/07/22.  Last CBC Lab Results  Component Value Date   WBC 7.6 05/03/2022   HGB 7.6 (L) 05/03/2022   HCT 22.6 (L) 05/03/2022   MCV 83 05/03/2022   MCH 27.7 05/03/2022   RDW 26.2 (H) 05/03/2022   PLT 210 92/42/6834   Last metabolic panel Lab Results  Component Value Date   GLUCOSE 86 05/03/2022   NA 137 05/03/2022   K 5.1 05/03/2022   CL 104 05/03/2022   CO2 19 (L) 05/03/2022   BUN 15 05/03/2022   CREATININE 1.18 05/03/2022   EGFR 73 05/03/2022   CALCIUM 9.0 05/03/2022   PROT 7.4 05/03/2022   ALBUMIN 4.5 05/03/2022   LABGLOB 2.9 05/03/2022   AGRATIO 1.6 05/03/2022   BILITOT 1.5 (H) 05/03/2022   ALKPHOS 249 (H) 05/03/2022   AST 38 05/03/2022   ALT 20 05/03/2022   ANIONGAP 5 06/13/2021   Last lipids Lab Results  Component Value Date   CHOL 127 03/01/2021   HDL 43 03/01/2021   LDLCALC 66 03/01/2021   TRIG 89 03/01/2021   CHOLHDL 3.0 03/01/2021   Last hemoglobin A1c Lab Results  Component Value Date   HGBA1C <4.2 (L) 03/01/2021   Last thyroid functions Lab Results  Component Value Date   TSH 1.730 03/01/2021   Last vitamin D Lab Results  Component Value Date   VD25OH 15.8 (L) 05/03/2022   Last vitamin B12 and Folate No results found for: "VITAMINB12", "FOLATE"    The ASCVD Risk score (Arnett DK, et al., 2019) failed to calculate for the following reasons:   The valid total cholesterol range is 130 to 320 mg/dL    Assessment & Plan:   Problem List Items Addressed This Visit       Other   Vitamin D deficiency   Relevant Orders   Sickle Cell Panel   Sickle cell anemia ([place]) - Primary   Relevant Medications   oxyCODONE-acetaminophen (PERCOCET) 10-325 MG tablet (Start on 08/09/2022)   Other Relevant Orders   Sickle Cell  Panel   Chronic pain   Relevant Medications   oxyCODONE-acetaminophen (PERCOCET) 10-325 MG tablet (Start on 08/09/2022)   Other Relevant Orders   [id number] 11+Oxyco+Alc+Crt-Bund   Other Visit Diagnoses     Acute cough       Relevant Medications   guaiFENesin (MUCINEX) 600 MG 12 hr tablet       Return in about 3 months (around 11/07/2022) for sickle cell anemia.   Donia Pounds  APRN, MSN, FNP-C Patient [facility] [address] [place], [place] [address] [phone]

## 2022-08-08 ENCOUNTER — Other Ambulatory Visit: Payer: Self-pay | Admitting: Family Medicine

## 2022-08-08 DIAGNOSIS — R7989 Other specified abnormal findings of blood chemistry: Secondary | ICD-10-CM

## 2022-08-08 NOTE — Progress Notes (Signed)
Reviewed labs, elevated serum creatinine.  More than likely secondary to dehydration.  Patient advised to increase fluid intake to 64 ounces per day and avoid all nephrotoxins.  He will return to clinic in 2 weeks to repeat serum creatinine level.  Orders Placed This Encounter  Procedures   Basic metabolic panel    Standing Status:   Future    Standing Expiration Date:   08/09/2023    Donia Pounds  APRN, MSN, FNP-C Patient [facility] [address] [place], [place] [address] [phone]

## 2022-08-08 NOTE — Progress Notes (Signed)
Chang Tiggs is a 56 year old male with a medical history significant for sickle cell disease, chronic pain syndrome, opiate dependence and tolerance, and vitamin D deficiency that presented for follow-up of chronic conditions on 08/07/2022. Reviewed all laboratory values, notable for decreased vitamin D at 24.3.  Recommend over-the-counter D3 at 1000 mcg daily.  Also, recommend vitamin D fortified foods such as mackerel, salmon, yogurt, etc.  Also, AST slightly elevated.  Have patient refrain from any additional Tylenol.  Patient's serum creatinine (indicator of renal functioning) is elevated, patient will need to return to clinic in 1 week to repeat creatinine level.  Recommend hydrating with 64 ounces of water daily and refraining from any nephrotoxins like ibuprofen or naproxen.  Please ensure that patient has lab appointment scheduled.  Otherwise, we will follow-up in 3 months.  Donia Pounds  APRN, MSN, FNP-C Patient [facility] [address] [place], [place] [address] [phone]

## 2022-08-09 ENCOUNTER — Other Ambulatory Visit (HOSPITAL_COMMUNITY): Payer: Self-pay

## 2022-08-09 LAB — DRUG SCREEN 764883 11+OXYCO+ALC+CRT-BUND
Amphetamines, Urine: NEGATIVE ng/mL
BENZODIAZ UR QL: NEGATIVE ng/mL
Barbiturate: NEGATIVE ng/mL
Cannabinoid Quant, Ur: NEGATIVE ng/mL
Cocaine (Metabolite): NEGATIVE ng/mL
Creatinine: 207.4 mg/dL (ref 20.0–300.0)
Ethanol: NEGATIVE %
Meperidine: NEGATIVE ng/mL
Methadone Screen, Urine: NEGATIVE ng/mL
OPIATE SCREEN URINE: NEGATIVE ng/mL
Oxycodone/Oxymorphone, Urine: NEGATIVE ng/mL
Phencyclidine: NEGATIVE ng/mL
Propoxyphene: NEGATIVE ng/mL
Tramadol: NEGATIVE ng/mL
pH, Urine: 6.2 (ref 4.5–8.9)

## 2022-08-10 LAB — CMP14+CBC/D/PLT+FER+RETIC+V...
ALT: 18 IU/L (ref 0–44)
AST: 49 IU/L — ABNORMAL HIGH (ref 0–40)
Albumin/Globulin Ratio: 1.7 (ref 1.2–2.2)
Albumin: 4.3 g/dL (ref 3.8–4.9)
Alkaline Phosphatase: 255 IU/L — ABNORMAL HIGH (ref 44–121)
BUN/Creatinine Ratio: 9 (ref 9–20)
BUN: 14 mg/dL (ref 6–24)
Basophils Absolute: 0 10*3/uL (ref 0.0–0.2)
Basos: 0 %
Bilirubin Total: 2.2 mg/dL — ABNORMAL HIGH (ref 0.0–1.2)
CO2: 21 mmol/L (ref 20–29)
Calcium: 9.4 mg/dL (ref 8.7–10.2)
Chloride: 105 mmol/L (ref 96–106)
Creatinine, Ser: 1.52 mg/dL — ABNORMAL HIGH (ref 0.76–1.27)
EOS (ABSOLUTE): 0.2 10*3/uL (ref 0.0–0.4)
Eos: 2 %
Ferritin: 71 ng/mL (ref 30–400)
Globulin, Total: 2.6 g/dL (ref 1.5–4.5)
Glucose: 90 mg/dL (ref 70–99)
Hematocrit: 21 % — ABNORMAL LOW (ref 37.5–51.0)
Hemoglobin: 6.8 g/dL — CL (ref 13.0–17.7)
Immature Grans (Abs): 0.1 10*3/uL (ref 0.0–0.1)
Immature Granulocytes: 1 %
Lymphocytes Absolute: 3.3 10*3/uL — ABNORMAL HIGH (ref 0.7–3.1)
Lymphs: 35 %
MCH: 29.3 pg (ref 26.6–33.0)
MCHC: 32.4 g/dL (ref 31.5–35.7)
MCV: 91 fL (ref 79–97)
Monocytes Absolute: 1.1 10*3/uL — ABNORMAL HIGH (ref 0.1–0.9)
Monocytes: 11 %
NRBC: 5 % — ABNORMAL HIGH (ref 0–0)
Neutrophils Absolute: 4.9 10*3/uL (ref 1.4–7.0)
Neutrophils: 51 %
Platelets: 217 10*3/uL (ref 150–450)
Potassium: 5.2 mmol/L (ref 3.5–5.2)
RBC: 2.32 x10E6/uL — CL (ref 4.14–5.80)
RDW: 23.5 % — ABNORMAL HIGH (ref 11.6–15.4)
Retic Ct Pct: 7.1 % — ABNORMAL HIGH (ref 0.6–2.6)
Sodium: 138 mmol/L (ref 134–144)
Total Protein: 6.9 g/dL (ref 6.0–8.5)
Vit D, 25-Hydroxy: 24.3 ng/mL — ABNORMAL LOW (ref 30.0–100.0)
WBC: 9.5 10*3/uL (ref 3.4–10.8)
eGFR: 54 mL/min/{1.73_m2} — ABNORMAL LOW (ref >59)

## 2022-08-14 ENCOUNTER — Other Ambulatory Visit: Payer: Medicare HMO

## 2022-08-14 DIAGNOSIS — R7989 Other specified abnormal findings of blood chemistry: Secondary | ICD-10-CM | POA: Diagnosis not present

## 2022-08-15 LAB — BASIC METABOLIC PANEL
BUN/Creatinine Ratio: 15 (ref 9–20)
BUN: 22 mg/dL (ref 6–24)
CO2: 19 mmol/L — ABNORMAL LOW (ref 20–29)
Calcium: 9.2 mg/dL (ref 8.7–10.2)
Chloride: 105 mmol/L (ref 96–106)
Creatinine, Ser: 1.43 mg/dL — ABNORMAL HIGH (ref 0.76–1.27)
Glucose: 97 mg/dL (ref 70–99)
Potassium: 6 mmol/L — ABNORMAL HIGH (ref 3.5–5.2)
Sodium: 137 mmol/L (ref 134–144)
eGFR: 58 mL/min/{1.73_m2} — ABNORMAL LOW (ref >59)

## 2022-08-20 ENCOUNTER — Other Ambulatory Visit: Payer: Self-pay | Admitting: Family Medicine

## 2022-08-20 DIAGNOSIS — G894 Chronic pain syndrome: Secondary | ICD-10-CM

## 2022-08-20 DIAGNOSIS — D571 Sickle-cell disease without crisis: Secondary | ICD-10-CM

## 2022-08-21 ENCOUNTER — Other Ambulatory Visit (HOSPITAL_COMMUNITY): Payer: Self-pay

## 2022-08-22 ENCOUNTER — Encounter: Payer: Self-pay | Admitting: Family Medicine

## 2022-08-22 ENCOUNTER — Other Ambulatory Visit (HOSPITAL_COMMUNITY): Payer: Self-pay

## 2022-08-23 ENCOUNTER — Telehealth: Payer: Self-pay | Admitting: Family Medicine

## 2022-08-23 ENCOUNTER — Other Ambulatory Visit: Payer: Self-pay | Admitting: Family Medicine

## 2022-08-23 DIAGNOSIS — D571 Sickle-cell disease without crisis: Secondary | ICD-10-CM

## 2022-08-23 DIAGNOSIS — G894 Chronic pain syndrome: Secondary | ICD-10-CM

## 2022-08-23 NOTE — Telephone Encounter (Signed)
Scott Norton is a 56 year old male with a medical history significant for sickle cell disease, chronic pain syndrome, opiate dependence and tolerance, and history of anemia of chronic disease is requesting opiate pain medications.   Reviewed patient's last 2 drug screens, no opliates detected. Patient will not be able to get prescription opiates from this clinic prior to undergoing a repeat urine drug screen.   Attempted to call patient on 3 separate occasions.    Donia Pounds  APRN, MSN, FNP-C Patient [name] [address] [place], [place] [address] [phone]

## 2022-08-24 ENCOUNTER — Other Ambulatory Visit (HOSPITAL_COMMUNITY): Payer: Self-pay

## 2022-08-24 ENCOUNTER — Other Ambulatory Visit: Payer: Self-pay | Admitting: Family Medicine

## 2022-08-24 ENCOUNTER — Other Ambulatory Visit: Payer: Medicare HMO

## 2022-08-24 ENCOUNTER — Encounter: Payer: Self-pay | Admitting: Gastroenterology

## 2022-08-24 DIAGNOSIS — D571 Sickle-cell disease without crisis: Secondary | ICD-10-CM

## 2022-08-24 DIAGNOSIS — G894 Chronic pain syndrome: Secondary | ICD-10-CM

## 2022-08-24 NOTE — Progress Notes (Signed)
Orders Placed This Encounter  Procedures   [id number] 11+Oxyco+Alc+Crt-Bund    Standing Status:   Future    Standing Expiration Date:   08/25/2023     Leanne Sisler Moore Valincia Touch  APRN, MSN, FNP-C Patient [facility] [address]  [place], [place] [address] [phone]  

## 2022-08-25 LAB — DRUG SCREEN 764883 11+OXYCO+ALC+CRT-BUND
Amphetamines, Urine: NEGATIVE ng/mL
BENZODIAZ UR QL: NEGATIVE ng/mL
Barbiturate: NEGATIVE ng/mL
Cannabinoid Quant, Ur: NEGATIVE ng/mL
Cocaine (Metabolite): NEGATIVE ng/mL
Creatinine: 246.4 mg/dL (ref 20.0–300.0)
Ethanol: NEGATIVE %
Meperidine: NEGATIVE ng/mL
Methadone Screen, Urine: NEGATIVE ng/mL
OPIATE SCREEN URINE: NEGATIVE ng/mL
Oxycodone/Oxymorphone, Urine: NEGATIVE ng/mL
Phencyclidine: NEGATIVE ng/mL
Propoxyphene: NEGATIVE ng/mL
Tramadol: NEGATIVE ng/mL
pH, Urine: 5.4 (ref 4.5–8.9)

## 2022-08-27 ENCOUNTER — Other Ambulatory Visit: Payer: Self-pay | Admitting: Family Medicine

## 2022-08-27 ENCOUNTER — Telehealth: Payer: Self-pay | Admitting: Family Medicine

## 2022-08-27 ENCOUNTER — Other Ambulatory Visit: Payer: Self-pay

## 2022-08-27 ENCOUNTER — Other Ambulatory Visit (HOSPITAL_COMMUNITY): Payer: Self-pay

## 2022-08-27 DIAGNOSIS — D571 Sickle-cell disease without crisis: Secondary | ICD-10-CM

## 2022-08-27 DIAGNOSIS — G894 Chronic pain syndrome: Secondary | ICD-10-CM

## 2022-08-27 NOTE — Telephone Encounter (Signed)
Scott Norton is requesting to fill pt oxycodone. Please advise [facility]  

## 2022-08-27 NOTE — Telephone Encounter (Signed)
Canden Cieslinski is a 56 year old male with a medical history significant for sickle cell disease, opiate dependence and tolerance, and chronic pain syndrome. Patient has been requesting and receiving prescriptions for chronic pain for greater than 1 year. According to patient's fill history, he is requesting refills around every 15 days.  On reviewing urine drug screens, there have not been any opiates detected in this patients urine on 08/28/2021, 05/03/2022, 08/07/2022, or 08/24/2022. Patient's last prescription was filled on 08/09/2022.   This patient can no longer receive prescription opiates from this provider. Attempted to reach patient on 2 listed numbers that are both out of service.   If there are any further questions or concerns. This patient can make an appointment for medication management.    Donia Pounds  APRN, MSN, FNP-C Patient [facility] [address] [place], [place] [address] [phone]

## 2022-08-30 ENCOUNTER — Other Ambulatory Visit: Payer: Self-pay | Admitting: Family Medicine

## 2022-08-30 ENCOUNTER — Telehealth: Payer: Self-pay | Admitting: Family Medicine

## 2022-08-30 DIAGNOSIS — D571 Sickle-cell disease without crisis: Secondary | ICD-10-CM

## 2022-08-30 DIAGNOSIS — G894 Chronic pain syndrome: Secondary | ICD-10-CM

## 2022-08-30 NOTE — Telephone Encounter (Signed)
Quince Santana is a 56 year old male with a medical history significant for sickle cell disease and chronic pain syndrome and is requesting opiate medications.   09/07/2021, 05/03/2022, 08/07/2022, and 08/24/2022 patient did not have any opiates in urine.  Patient is requesting Percocet 10-325 mg approximately every 15 days according to PDMP.  Patient will not receive any opiate medications from this provider.  He can schedule an appointment for medication management to discuss nonopiate means of pain control.  Attempted to contact patient 3 separate occasions, both numbers listed appear to be invalid.  [name]  APRN, MSN, FNP-C Patient Care [facility] [address] [place], [place] [address] [phone]

## 2022-08-30 NOTE — Telephone Encounter (Signed)
Oxycodone  °

## 2022-08-31 NOTE — Telephone Encounter (Signed)
Scott Norton is requesting to fill pt oxy. Please advise [facility]

## 2022-09-05 ENCOUNTER — Telehealth: Payer: Self-pay

## 2022-09-05 ENCOUNTER — Other Ambulatory Visit (HOSPITAL_COMMUNITY): Payer: Self-pay

## 2022-09-05 NOTE — Telephone Encounter (Signed)
Dr. Lavon Paganini,  During chart prep for this patient's Pre Visit appt, it was found that he has sickle cell anemia.  He has been scheduled for a direct colon on 09/21/2022 with you after a Pre Vitis on 09/11/2022.    Does this patient need to be seen in the office by you or can he proceed with his Pre Visit appt as scheduled? Please advise/Thank you

## 2022-09-06 NOTE — Telephone Encounter (Signed)
Would one of you please call this patient and have him to schedule an OV with either an APP or Dr. Lavon Paganini- please see previous notation- pt has sickle cell anemia and needs to be seen in office prior to scheduling colon- thank you-[facility] and [facility] procedure appts have already been cancelled-

## 2022-09-06 NOTE — Telephone Encounter (Signed)
Please cancel the procedure in [facility], he will need office visit with me or APP next available appointment.  Thank you

## 2022-09-10 ENCOUNTER — Other Ambulatory Visit: Payer: Self-pay

## 2022-09-10 ENCOUNTER — Inpatient Hospital Stay (HOSPITAL_COMMUNITY)
Admission: EM | Admit: 2022-09-10 | Discharge: 2022-09-15 | DRG: 811 | Disposition: A | Discharge status: Home / Self Care | Payer: Medicare HMO | Attending: Internal Medicine | Admitting: Internal Medicine

## 2022-09-10 DIAGNOSIS — Z832 Family history of diseases of the blood and blood-forming organs and certain disorders involving the immune mechanism: Secondary | ICD-10-CM

## 2022-09-10 DIAGNOSIS — J811 Chronic pulmonary edema: Secondary | ICD-10-CM | POA: Diagnosis not present

## 2022-09-10 DIAGNOSIS — F17210 Nicotine dependence, cigarettes, uncomplicated: Secondary | ICD-10-CM | POA: Diagnosis present

## 2022-09-10 DIAGNOSIS — R0689 Other abnormalities of breathing: Secondary | ICD-10-CM | POA: Diagnosis not present

## 2022-09-10 DIAGNOSIS — F32A Depression, unspecified: Secondary | ICD-10-CM | POA: Diagnosis not present

## 2022-09-10 DIAGNOSIS — G8929 Other chronic pain: Secondary | ICD-10-CM | POA: Diagnosis present

## 2022-09-10 DIAGNOSIS — Z8249 Family history of ischemic heart disease and other diseases of the circulatory system: Secondary | ICD-10-CM

## 2022-09-10 DIAGNOSIS — D696 Thrombocytopenia, unspecified: Secondary | ICD-10-CM | POA: Diagnosis not present

## 2022-09-10 DIAGNOSIS — R531 Weakness: Secondary | ICD-10-CM

## 2022-09-10 DIAGNOSIS — D5700 Hb-SS disease with crisis, unspecified: Secondary | ICD-10-CM | POA: Diagnosis not present

## 2022-09-10 DIAGNOSIS — D571 Sickle-cell disease without crisis: Secondary | ICD-10-CM | POA: Diagnosis not present

## 2022-09-10 DIAGNOSIS — D649 Anemia, unspecified: Secondary | ICD-10-CM | POA: Diagnosis not present

## 2022-09-10 DIAGNOSIS — G894 Chronic pain syndrome: Secondary | ICD-10-CM | POA: Diagnosis present

## 2022-09-10 DIAGNOSIS — T68XXXA Hypothermia, initial encounter: Secondary | ICD-10-CM | POA: Diagnosis not present

## 2022-09-10 DIAGNOSIS — F1120 Opioid dependence, uncomplicated: Secondary | ICD-10-CM | POA: Diagnosis not present

## 2022-09-10 DIAGNOSIS — A419 Sepsis, unspecified organism: Secondary | ICD-10-CM | POA: Diagnosis not present

## 2022-09-10 DIAGNOSIS — I959 Hypotension, unspecified: Secondary | ICD-10-CM | POA: Diagnosis not present

## 2022-09-10 DIAGNOSIS — Z6821 Body mass index (BMI) 21.0-21.9, adult: Secondary | ICD-10-CM

## 2022-09-10 DIAGNOSIS — Z1152 Encounter for screening for COVID-19: Secondary | ICD-10-CM

## 2022-09-10 DIAGNOSIS — E440 Moderate protein-calorie malnutrition: Secondary | ICD-10-CM | POA: Diagnosis not present

## 2022-09-10 DIAGNOSIS — Z888 Allergy status to other drugs, medicaments and biological substances status: Secondary | ICD-10-CM | POA: Diagnosis not present

## 2022-09-10 DIAGNOSIS — Z91048 Other nonmedicinal substance allergy status: Secondary | ICD-10-CM | POA: Diagnosis not present

## 2022-09-10 DIAGNOSIS — D638 Anemia in other chronic diseases classified elsewhere: Secondary | ICD-10-CM | POA: Diagnosis present

## 2022-09-10 DIAGNOSIS — R079 Chest pain, unspecified: Secondary | ICD-10-CM | POA: Diagnosis not present

## 2022-09-10 DIAGNOSIS — Z5900 Homelessness unspecified: Secondary | ICD-10-CM | POA: Diagnosis not present

## 2022-09-10 DIAGNOSIS — R0902 Hypoxemia: Secondary | ICD-10-CM | POA: Diagnosis not present

## 2022-09-10 DIAGNOSIS — N179 Acute kidney failure, unspecified: Secondary | ICD-10-CM | POA: Diagnosis present

## 2022-09-10 DIAGNOSIS — F17290 Nicotine dependence, other tobacco product, uncomplicated: Secondary | ICD-10-CM | POA: Diagnosis not present

## 2022-09-10 DIAGNOSIS — F1410 Cocaine abuse, uncomplicated: Secondary | ICD-10-CM | POA: Diagnosis not present

## 2022-09-10 DIAGNOSIS — Z5901 Sheltered homelessness: Secondary | ICD-10-CM

## 2022-09-10 DIAGNOSIS — R6520 Severe sepsis without septic shock: Secondary | ICD-10-CM | POA: Diagnosis present

## 2022-09-10 DIAGNOSIS — Z743 Need for continuous supervision: Secondary | ICD-10-CM | POA: Diagnosis not present

## 2022-09-10 DIAGNOSIS — Z885 Allergy status to narcotic agent status: Secondary | ICD-10-CM | POA: Diagnosis not present

## 2022-09-10 DIAGNOSIS — E875 Hyperkalemia: Secondary | ICD-10-CM | POA: Diagnosis not present

## 2022-09-10 DIAGNOSIS — R69 Illness, unspecified: Secondary | ICD-10-CM | POA: Diagnosis not present

## 2022-09-10 DIAGNOSIS — R5081 Fever presenting with conditions classified elsewhere: Secondary | ICD-10-CM | POA: Diagnosis present

## 2022-09-10 DIAGNOSIS — D72829 Elevated white blood cell count, unspecified: Secondary | ICD-10-CM | POA: Diagnosis present

## 2022-09-10 LAB — COMPREHENSIVE METABOLIC PANEL
ALT: 193 U/L — ABNORMAL HIGH (ref 0–44)
AST: 350 U/L — ABNORMAL HIGH (ref 15–41)
Albumin: 3.8 g/dL (ref 3.5–5.0)
Alkaline Phosphatase: 153 U/L — ABNORMAL HIGH (ref 38–126)
Anion gap: 11 (ref 5–15)
BUN: 68 mg/dL — ABNORMAL HIGH (ref 6–20)
CO2: 14 mmol/L — ABNORMAL LOW (ref 22–32)
Calcium: 8.6 mg/dL — ABNORMAL LOW (ref 8.9–10.3)
Chloride: 113 mmol/L — ABNORMAL HIGH (ref 98–111)
Creatinine, Ser: 3.58 mg/dL — ABNORMAL HIGH (ref 0.61–1.24)
GFR, Estimated: 19 mL/min — ABNORMAL LOW (ref >60)
Glucose, Bld: 97 mg/dL (ref 70–99)
Potassium: 4.8 mmol/L (ref 3.5–5.1)
Sodium: 138 mmol/L (ref 135–145)
Total Bilirubin: 2.4 mg/dL — ABNORMAL HIGH (ref 0.3–1.2)
Total Protein: 6.8 g/dL (ref 6.5–8.1)

## 2022-09-10 LAB — CBC WITH DIFFERENTIAL/PLATELET
Abs Immature Granulocytes: 0.07 10*3/uL (ref 0.00–0.07)
Basophils Absolute: 0 10*3/uL (ref 0.0–0.1)
Basophils Relative: 0 %
Eosinophils Absolute: 0.1 10*3/uL (ref 0.0–0.5)
Eosinophils Relative: 1 %
HCT: 15.1 % — ABNORMAL LOW (ref 39.0–52.0)
Hemoglobin: 5.5 g/dL — CL (ref 13.0–17.0)
Immature Granulocytes: 1 %
Lymphocytes Relative: 26 %
Lymphs Abs: 3.5 10*3/uL (ref 0.7–4.0)
MCH: 29.1 pg (ref 26.0–34.0)
MCHC: 36.4 g/dL — ABNORMAL HIGH (ref 30.0–36.0)
MCV: 79.9 fL — ABNORMAL LOW (ref 80.0–100.0)
Monocytes Absolute: 1.9 10*3/uL — ABNORMAL HIGH (ref 0.1–1.0)
Monocytes Relative: 14 %
Neutro Abs: 8 10*3/uL — ABNORMAL HIGH (ref 1.7–7.7)
Neutrophils Relative %: 58 %
Platelets: 139 10*3/uL — ABNORMAL LOW (ref 150–400)
RBC: 1.89 MIL/uL — ABNORMAL LOW (ref 4.22–5.81)
RDW: 26.8 % — ABNORMAL HIGH (ref 11.5–15.5)
WBC: 13.6 10*3/uL — ABNORMAL HIGH (ref 4.0–10.5)
nRBC: 2.1 % — ABNORMAL HIGH (ref 0.0–0.2)

## 2022-09-10 LAB — URINALYSIS, ROUTINE W REFLEX MICROSCOPIC
Bilirubin Urine: NEGATIVE
Glucose, UA: NEGATIVE mg/dL
Ketones, ur: NEGATIVE mg/dL
Leukocytes,Ua: NEGATIVE
Nitrite: NEGATIVE
Protein, ur: 100 mg/dL — AB
Specific Gravity, Urine: 1.011 (ref 1.005–1.030)
pH: 5 (ref 5.0–8.0)

## 2022-09-10 LAB — BASIC METABOLIC PANEL
Anion gap: 11 (ref 5–15)
BUN: 73 mg/dL — ABNORMAL HIGH (ref 6–20)
CO2: 13 mmol/L — ABNORMAL LOW (ref 22–32)
Calcium: 8.2 mg/dL — ABNORMAL LOW (ref 8.9–10.3)
Chloride: 112 mmol/L — ABNORMAL HIGH (ref 98–111)
Creatinine, Ser: 4.68 mg/dL — ABNORMAL HIGH (ref 0.61–1.24)
GFR, Estimated: 14 mL/min — ABNORMAL LOW (ref >60)
Glucose, Bld: 96 mg/dL (ref 70–99)
Potassium: 5.5 mmol/L — ABNORMAL HIGH (ref 3.5–5.1)
Sodium: 136 mmol/L (ref 135–145)

## 2022-09-10 LAB — RAPID URINE DRUG SCREEN, HOSP PERFORMED
Amphetamines: NOT DETECTED
Barbiturates: NOT DETECTED
Benzodiazepines: NOT DETECTED
Cocaine: NOT DETECTED
Opiates: NOT DETECTED
Tetrahydrocannabinol: NOT DETECTED

## 2022-09-10 LAB — HEPATIC FUNCTION PANEL
ALT: 173 U/L — ABNORMAL HIGH (ref 0–44)
AST: 283 U/L — ABNORMAL HIGH (ref 15–41)
Albumin: 3.6 g/dL (ref 3.5–5.0)
Alkaline Phosphatase: 147 U/L — ABNORMAL HIGH (ref 38–126)
Bilirubin, Direct: 1.1 mg/dL — ABNORMAL HIGH (ref 0.0–0.2)
Indirect Bilirubin: 1.2 mg/dL — ABNORMAL HIGH (ref 0.3–0.9)
Total Bilirubin: 2.3 mg/dL — ABNORMAL HIGH (ref 0.3–1.2)
Total Protein: 6.6 g/dL (ref 6.5–8.1)

## 2022-09-10 LAB — PROTIME-INR
INR: 1.1 (ref 0.8–1.2)
Prothrombin Time: 14.3 seconds (ref 11.4–15.2)

## 2022-09-10 LAB — LACTATE DEHYDROGENASE: LDH: 1428 U/L — ABNORMAL HIGH (ref 98–192)

## 2022-09-10 LAB — PREPARE RBC (CROSSMATCH)

## 2022-09-10 MED ORDER — ORAL CARE MOUTH RINSE: 15.0000 mL | OROMUCOSAL | Status: DC | PRN

## 2022-09-10 MED ORDER — FOLIC ACID 1 MG PO TABS
1.0000 mg | ORAL_TABLET | Freq: Every day | ORAL | Status: DC
  Administered 2022-09-10 – 2022-09-15 (×5): 1 mg via ORAL
  Filled 2022-09-10 (×6): qty 1

## 2022-09-10 MED ORDER — HYDROMORPHONE HCL 1 MG/ML IJ SOLN: 0.5000 mg | INTRAMUSCULAR | Status: DC

## 2022-09-10 MED ORDER — KETOROLAC TROMETHAMINE 15 MG/ML IJ SOLN: 15.0000 mg | Freq: Four times a day (QID) | INTRAMUSCULAR | Status: DC

## 2022-09-10 MED ORDER — OXYCODONE-ACETAMINOPHEN 5-325 MG PO TABS
2.0000 | ORAL_TABLET | Freq: Once | ORAL | Status: AC
  Administered 2022-09-10: 2 via ORAL
  Filled 2022-09-10: qty 2

## 2022-09-10 MED ORDER — FENTANYL CITRATE PF 50 MCG/ML IJ SOSY
100.0000 ug | PREFILLED_SYRINGE | INTRAMUSCULAR | Status: DC | PRN
  Administered 2022-09-10: 100 ug via INTRAVENOUS
  Filled 2022-09-10: qty 2

## 2022-09-10 MED ORDER — ONDANSETRON 4 MG PO TBDP
4.0000 mg | ORAL_TABLET | Freq: Once | ORAL | Status: AC
  Administered 2022-09-10: 4 mg via ORAL
  Filled 2022-09-10: qty 1

## 2022-09-10 MED ORDER — ONDANSETRON HCL 4 MG/2ML IJ SOLN
4.0000 mg | Freq: Four times a day (QID) | INTRAMUSCULAR | Status: DC | PRN
  Administered 2022-09-10: 4 mg via INTRAVENOUS

## 2022-09-10 MED ORDER — POLYETHYLENE GLYCOL 3350 17 G PO PACK
17.0000 g | PACK | Freq: Every day | ORAL | Status: DC | PRN
  Administered 2022-09-10: 17 g via ORAL

## 2022-09-10 MED ORDER — HYDROMORPHONE 1 MG/ML IV SOLN: INTRAVENOUS | Status: DC

## 2022-09-10 MED ORDER — OXYCODONE HCL 5 MG PO TABS
10.0000 mg | ORAL_TABLET | ORAL | Status: DC | PRN
  Administered 2022-09-10 – 2022-09-14 (×8): 10 mg via ORAL
  Filled 2022-09-10 (×8): qty 2

## 2022-09-10 MED ORDER — SODIUM CHLORIDE 0.9% FLUSH: 9.0000 mL | INTRAVENOUS | Status: DC | PRN

## 2022-09-10 MED ORDER — SODIUM CHLORIDE 0.9 % IV SOLN
12.5000 mg | Freq: Once | INTRAVENOUS | Status: AC
  Administered 2022-09-10: 12.5 mg via INTRAVENOUS
  Filled 2022-09-10: qty 12.5

## 2022-09-10 MED ORDER — SODIUM CHLORIDE 0.45 % IV SOLN: INTRAVENOUS | Status: DC

## 2022-09-10 MED ORDER — ONDANSETRON HCL 4 MG/2ML IJ SOLN
4.0000 mg | INTRAMUSCULAR | Status: DC | PRN
  Filled 2022-09-10: qty 2

## 2022-09-10 MED ORDER — DIPHENHYDRAMINE HCL 50 MG/ML IJ SOLN: 12.5000 mg | Freq: Four times a day (QID) | INTRAMUSCULAR | Status: DC | PRN

## 2022-09-10 MED ORDER — HYDROMORPHONE HCL 1 MG/ML IJ SOLN: 1.0000 mg | INTRAMUSCULAR | Status: DC

## 2022-09-10 MED ORDER — SODIUM CHLORIDE 0.9 % IV SOLN
10.0000 mL/h | Freq: Once | INTRAVENOUS | Status: AC
  Administered 2022-09-10: 10 mL/h via INTRAVENOUS

## 2022-09-10 MED ORDER — SENNOSIDES-DOCUSATE SODIUM 8.6-50 MG PO TABS
1.0000 | ORAL_TABLET | Freq: Two times a day (BID) | ORAL | Status: DC
  Administered 2022-09-10 – 2022-09-14 (×8): 1 via ORAL
  Filled 2022-09-10 (×10): qty 1

## 2022-09-10 MED ORDER — NALOXONE HCL 0.4 MG/ML IJ SOLN: 0.4000 mg | INTRAMUSCULAR | Status: DC | PRN

## 2022-09-10 MED ORDER — DIPHENHYDRAMINE HCL 12.5 MG/5ML PO ELIX: 12.5000 mg | ORAL_SOLUTION | Freq: Four times a day (QID) | ORAL | Status: DC | PRN

## 2022-09-10 MED ORDER — DEXTROSE-NACL 5-0.45 % IV SOLN: INTRAVENOUS | Status: DC

## 2022-09-10 NOTE — ED Provider Notes (Signed)
[facility]-EMERGENCY DEPT Provider Note   CSN: [id number] Arrival date & time: 09/10/22  0347     History  Chief Complaint  Patient presents with   Sickle Cell Pain Crisis    Scott Norton is a 56 y.o. male who presents emergency department with chief complaint of sickle cell pain crisis.  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  He is not on any opiate therapy.  He states that for the past 3 days he has had progressively worsening weakness and severe diffuse pain.  He denies fevers chills chest pain or shortness of breath   Sickle Cell Pain Crisis      Home Medications Prior to Admission medications   Medication Sig Start Date End Date Taking? Authorizing Provider  ibuprofen (ADVIL) 800 MG tablet Take 1 tablet by mouth every 8 hours as needed. Patient taking differently: Take 800 mg by mouth every 8 (eight) hours as needed for moderate pain. 07/17/22  Yes Massie Maroon, FNP  oxyCODONE-acetaminophen (PERCOCET) 10-325 MG tablet Take 1 tablet by mouth every 6 hours as needed for pain. Patient not taking: Reported on 09/10/2022 08/09/22 08/09/23  Massie Maroon, FNP      Allergies    Aspirin, Tape, and Tramadol    Review of Systems   Review of Systems  Physical Exam Updated Vital Signs BP (!) 101/46   Pulse (!) 135   Temp 97.8 F (36.6 C) (Oral)   Resp 10   Ht 6\' 3"  (1.905 m)   Wt 76.2 kg   SpO2 92%   BMI 21.00 kg/m  Physical Exam Vitals and nursing note reviewed.  Constitutional:      General: He is not in acute distress.    Appearance: He is well-developed and underweight. He is not diaphoretic.     Comments: Appears weak and extremely uncomfortable  HENT:     Head: Normocephalic and atraumatic.  Eyes:     General: Scleral icterus present.     Conjunctiva/sclera: Conjunctivae normal.  Cardiovascular:     Rate and Rhythm: Normal rate and regular rhythm.     Heart sounds: Normal heart sounds.  Pulmonary:     Effort:  Pulmonary effort is normal. No respiratory distress.     Breath sounds: Normal breath sounds.  Abdominal:     Palpations: Abdomen is soft.     Tenderness: There is no abdominal tenderness.  Musculoskeletal:     Cervical back: Normal range of motion and neck supple.  Skin:    General: Skin is warm and dry.  Neurological:     Mental Status: He is alert.  Psychiatric:        Behavior: Behavior normal.     ED Results / Procedures / Treatments   Labs (all labs ordered are listed, but only abnormal results are displayed) Labs Reviewed  CBC WITH DIFFERENTIAL/PLATELET - Abnormal; Notable for the following components:      Result Value   WBC 13.6 (*)    RBC 1.89 (*)    Hemoglobin 5.5 (*)    HCT 15.1 (*)    MCV 79.9 (*)    MCHC 36.4 (*)    RDW 26.8 (*)    Platelets 139 (*)    nRBC 2.1 (*)    Neutro Abs 8.0 (*)    Monocytes Absolute 1.9 (*)    All other components within normal limits  COMPREHENSIVE METABOLIC PANEL - Abnormal; Notable for the following components:   Chloride 113 (*)  CO2 14 (*)    BUN 68 (*)    Creatinine, Ser 3.58 (*)    Calcium 8.6 (*)    AST 350 (*)    ALT 193 (*)    Alkaline Phosphatase 153 (*)    Total Bilirubin 2.4 (*)    GFR, Estimated 19 (*)    All other components within normal limits  PROTIME-INR  RETICULOCYTES  URINALYSIS, ROUTINE W REFLEX MICROSCOPIC  LACTATE DEHYDROGENASE  RAPID URINE DRUG SCREEN, HOSP PERFORMED  HIV ANTIBODY (ROUTINE TESTING W REFLEX)  DRUG SCREEN 10 W/CONF, SERUM  BASIC METABOLIC PANEL  TYPE AND SCREEN  PREPARE RBC (CROSSMATCH)  TYPE AND SCREEN    EKG None  Radiology No results found.  Procedures .Critical Care  Performed by: Arthor Captain, PA-C Authorized by: Arthor Captain, PA-C   Critical care provider statement:    Critical care time (minutes):  60   Critical care time was exclusive of:  Separately billable procedures and treating other patients and teaching time   Critical care was necessary to  treat or prevent imminent or life-threatening deterioration of the following conditions:  Circulatory failure and renal failure   Critical care was time spent personally by me on the following activities:  Development of treatment plan with patient or surrogate, discussions with consultants, evaluation of patient's response to treatment, examination of patient, ordering and review of laboratory studies, ordering and review of radiographic studies, ordering and performing treatments and interventions, pulse oximetry, re-evaluation of patient's condition and review of old charts   Care discussed with: admitting provider       Medications Ordered in ED Medications  fentaNYL (SUBLIMAZE) injection 100 mcg (100 mcg Intravenous Given 09/10/22 1249)  0.45 % sodium chloride infusion ( Intravenous New Bag/Given 09/10/22 1320)  senna-docusate (Senokot-S) tablet 1 tablet (1 tablet Oral Given 09/10/22 1400)  polyethylene glycol (MIRALAX / GLYCOLAX) packet 17 g (has no administration in time range)  folic acid (FOLVITE) tablet 1 mg (1 mg Oral Given 09/10/22 1400)  naloxone (NARCAN) injection 0.4 mg (has no administration in time range)    And  sodium chloride flush (NS) 0.9 % injection 9 mL (has no administration in time range)  ondansetron (ZOFRAN) injection 4 mg (4 mg Intravenous Given 09/10/22 1250)  diphenhydrAMINE (BENADRYL) injection 12.5 mg (has no administration in time range)    Or  diphenhydrAMINE (BENADRYL) 12.5 MG/5ML elixir 12.5 mg (has no administration in time range)  HYDROmorphone (DILAUDID) 1 mg/mL PCA injection (has no administration in time range)  oxyCODONE-acetaminophen (PERCOCET/ROXICET) 5-325 MG per tablet 2 tablet (2 tablets Oral Given 09/10/22 0957)  ondansetron (ZOFRAN-ODT) disintegrating tablet 4 mg (4 mg Oral Given 09/10/22 0958)  diphenhydrAMINE (BENADRYL) 12.5 mg in sodium chloride 0.9 % 50 mL IVPB (0 mg Intravenous Stopped 09/10/22 1352)  0.9 %  sodium chloride infusion (10  mL/hr Intravenous New Bag/Given 09/10/22 1230)    ED Course/ Medical Decision Making/ A&P Clinical Course as of 09/10/22 1539  Mon Sep 10, 2022  1209 Hemoglobin(!!): 5.5 [AH]  1209 BUN(!): 68 [AH]  1209 Creatinine(!): 3.58 [AH]  1211 AST(!): 350 [AH]  1211 ALT(!): 193 [AH]  1211 Alkaline Phosphatase(!): 153 [AH]  1211 Total Bilirubin(!): 2.4 Labs reviewed significant anemia.  Patient also with AKI.  Case discussed with [place] Hollis who will admit the patient.  She asked that we defer fluid resuscitation at this time [AH]    Clinical Course User Index [AH] Arthor Captain, PA-C  Medical Decision Making Patient here with c/o severe pain consistent with SS pain crisis, Weakness and fatigue. The differential diagnosis of weakness includes but is not limited to neurologic causes (GBS, myasthenia gravis, CVA, MS, ALS, transverse myelitis, spinal cord injury, CVA, botulism, ) and other causes: ACS, Arrhythmia, syncope, orthostatic hypotension, sepsis, hypoglycemia, electrolyte disturbance, hypothyroidism, respiratory failure, symptomatic anemia, dehydration, heat injury, polypharmacy, malignancy.  Patient labs show hemolytic crisis and AKI. I did not order fluids per discussion with admitting provider.  Patient receiving 2 units of PRCs. Discussed risks.   Amount and/or Complexity of Data Reviewed Labs: ordered. Decision-making details documented in ED Course.  Risk Prescription drug management. Decision regarding hospitalization.           Final Clinical Impression(s) / ED Diagnoses Final diagnoses:  Hemolytic crisis  AKI (acute kidney injury) [facility])    Rx / DC Orders ED Discharge Orders     None         Arthor Captain, PA-C 09/10/22 1539    Linwood Dibbles, MD 09/11/22 1624

## 2022-09-10 NOTE — ED Provider Triage Note (Signed)
Emergency Medicine Provider Triage Evaluation Note  Scott Norton , a 56 y.o. male  was evaluated in triage.  Pt complains of diffuse body aches.+ ss pain crisis. Has [place] disease. Opiate naive. Diffuse pain x 3 days .  Review of Systems  Positive: Pain  Negative: Sob/cp  Physical Exam  BP (!) 103/51   Pulse 98   Temp 97.7 F (36.5 C) (Oral)   Resp 16   Ht 6\' 3"  (1.905 m)   Wt 77.1 kg   SpO2 98%   BMI 21.25 kg/m  Gen:   Awake, no distress   Resp:  Normal effort  MSK:   Moves extremities without difficulty  Other:  Appears uncomfortable  Medical Decision Making  Medically screening exam initiated at 9:55 AM.  Appropriate orders placed.  Scott Norton was informed that the remainder of the evaluation will be completed by another provider, this initial triage assessment does not replace that evaluation, and the importance of remaining in the ED until their evaluation is complete.     Scott Cruel, PA-C 09/10/22 [phone]

## 2022-09-10 NOTE — H&P (Signed)
H&P  Patient Demographics:  Scott Norton, is a 56 y.o. male  MRN: [id number]   DOB - 04-24-1966  Admit Date - 09/10/2022  Outpatient Primary MD for the patient is Massie Maroon, FNP  Chief Complaint  Patient presents with   Sickle Cell Pain Crisis      HPI:   Scott Norton  is a 56 y.o. male with a medical history significant for sickle cell disease, chronic pain syndrome, opiate dependence and tolerance, anemia of chronic disease, homelessness and history of cocaine abuse presented to the emergency department with complaints of ongoing fatigue and allover body pain over the past 3 days.  He states that pain is primarily to his joints.  Patient attributes increased pain to changes in weather and not having his opiate medications.  Patient has been without medications over the past several weeks.  Patient endorses fatigue and shortness of breath with exertion.  He rates his pain as 8/10, constant, and sharp.  He denies any fever, chills, chest pain, or dizziness.  No blurred vision or paresthesias.  No urinary symptoms, nausea, vomiting, or diarrhea.  No sick contacts, recent travel, or known exposure to COVID-19.  ER course: While in ER, patient's vital signs showed: BP 101/63   Pulse (!) 57   Temp 97.6 F (36.4 C) (Oral)   Resp 19   Ht 6\' 3"  (1.905 m)   Wt 76.2 kg   SpO2 95%   BMI 21.00 kg/m  Blood pressure typically runs low.  CBC notable for WBCs 13.6, hemoglobin 5.5 g/dL, and platelets .  Complete metabolic panel shows creatinine markedly elevated at 3.58, BUN 68, AST 350, ALT 193, ALP 153, and total bilirubin 2.4.  Urine drug screen and urinalysis pending.  Reticulocytes pending.   Review of systems:  Review of Systems  Constitutional:  Positive for malaise/fatigue.  HENT: Negative.    Respiratory:  Positive for shortness of breath.   Cardiovascular: Negative.   Gastrointestinal: Negative.   Genitourinary: Negative.   Musculoskeletal:  Positive for back pain and  joint pain.  Skin: Negative.   Neurological:  Positive for weakness. Negative for dizziness.     Past Medical History:  Diagnosis Date   AKI (acute kidney injury) (HCC) 02/08/2021   Anemia    Depression    Heart murmur    Seasonal allergies    wool & grass   Sickle cell anemia (HCC)       Past Surgical History:  Procedure Laterality Date   NO PAST SURGERIES     ROOT CANAL       Social History:   Social History   Tobacco Use   Smoking status: Some Days    Packs/day: 0.50    Types: Cigars, Cigarettes   Smokeless tobacco: Never   Tobacco comments:    10 cigars per week.  Substance Use Topics   Alcohol use: Not Currently     Lives - At home   Family History :   Family History  Problem Relation Age of Onset   Alcohol abuse Mother    Sickle cell trait Mother    Hypertension Mother    Alcohol abuse Father    Sickle cell trait Father    Early death Father        due to alcoholism   Hypertension Father    Hypertension Sister    Hypertension Brother    Hypertension Maternal Grandmother    Hypertension Maternal Grandfather    Hypertension Paternal Grandmother  Hypertension Paternal Grandfather      Home Medications:   Prior to Admission medications   Medication Sig Start Date End Date Taking? Authorizing Provider  ibuprofen (ADVIL) 800 MG tablet Take 1 tablet by mouth every 8 hours as needed. 07/17/22   Massie Maroon, FNP  oxyCODONE-acetaminophen (PERCOCET) 10-325 MG tablet Take 1 tablet by mouth every 6 hours as needed for pain. 08/09/22 08/09/23  Massie Maroon, FNP     Allergies:   Allergies  Allergen Reactions   Aspirin Other (See Comments)    Increased heart rate   Tape Other (See Comments)    rash   Tramadol Hives and Itching     Physical Exam:   Vitals:   Vitals:   09/10/22 0932 09/10/22 1129  BP: (!) 103/51 (!) 82/28  Pulse: 98 72  Resp: 16 18  Temp: 97.7 F (36.5 C)   SpO2: 98% 100%    Physical Exam: Constitutional:  Patient appears well-developed and well-nourished. Not in obvious distress. HENT: Normocephalic, atraumatic, External right and left ear normal. Oropharynx is clear and moist.  Eyes: Conjunctivae and EOM are normal. PERRLA, no scleral icterus. Neck: Normal ROM. Neck supple. No JVD. No tracheal deviation. No thyromegaly. CVS: RRR, S1/S2 +, Murmur present no gallops, no carotid bruit.  Pulmonary: Effort and breath sounds normal, no stridor, rhonchi, wheezes, rales.  Abdominal: Soft. BS +, no distension, tenderness, rebound or guarding.  Musculoskeletal: Normal range of motion. No edema and no tenderness.  Lymphadenopathy: No lymphadenopathy noted, cervical, inguinal or axillary Neuro: Alert. Normal reflexes, muscle tone coordination. No cranial nerve deficit. Skin: Skin is warm and dry. No rash noted. Not diaphoretic. No erythema. No pallor. Psychiatric: Normal mood and affect. Behavior, judgment, thought content normal.   Data Review:   CBC Recent Labs  Lab 09/10/22 1022  WBC 13.6*  HGB 5.5*  HCT 15.1*  PLT 139*  MCV 79.9*  MCH 29.1  MCHC 36.4*  RDW 26.8*  LYMPHSABS 3.5  MONOABS 1.9*  EOSABS 0.1  BASOSABS 0.0   ------------------------------------------------------------------------------------------------------------------  Chemistries  Recent Labs  Lab 09/10/22 1022  NA 138  K 4.8  CL 113*  CO2 14*  GLUCOSE 97  BUN 68*  CREATININE 3.58*  CALCIUM 8.6*  AST 350*  ALT 193*  ALKPHOS 153*  BILITOT 2.4*   ------------------------------------------------------------------------------------------------------------------ estimated creatinine clearance is 25.4 mL/min (A) (by C-G formula based on SCr of 3.58 mg/dL (H)). ------------------------------------------------------------------------------------------------------------------ No results for input(s): "TSH", "T4TOTAL", "T3FREE", "THYROIDAB" in the last 72 hours.  Invalid input(s): "FREET3"  Coagulation  profile Recent Labs  Lab 09/10/22 1022  INR 1.1   ------------------------------------------------------------------------------------------------------------------- No results for input(s): "DDIMER" in the last 72 hours. -------------------------------------------------------------------------------------------------------------------  Cardiac Enzymes No results for input(s): "CKMB", "TROPONINI", "MYOGLOBIN" in the last 168 hours.  Invalid input(s): "CK" ------------------------------------------------------------------------------------------------------------------    Component Value Date/Time   BNP 325.9 (H) 03/13/2021 0628    ---------------------------------------------------------------------------------------------------------------  Urinalysis    Component Value Date/Time   COLORURINE AMBER (A) 02/08/2021 0955   APPEARANCEUR CLEAR 02/08/2021 0955   LABSPEC 1.011 02/08/2021 0955   PHURINE 5.0 02/08/2021 0955   GLUCOSEU NEGATIVE 02/08/2021 0955   HGBUR SMALL (A) 02/08/2021 0955   BILIRUBINUR negative 09/07/2021 1002   BILIRUBINUR neg 05/02/2021 1120   KETONESUR negative 09/07/2021 1002   KETONESUR NEGATIVE 02/08/2021 0955   PROTEINUR Positive (A) 05/02/2021 1120   PROTEINUR 30 (A) 02/08/2021 0955   UROBILINOGEN 1.0 09/07/2021 1002   NITRITE Negative 09/07/2021 1002   NITRITE neg 05/02/2021  1120   NITRITE NEGATIVE 02/08/2021 0955   LEUKOCYTESUR Negative 09/07/2021 1002   LEUKOCYTESUR NEGATIVE 02/08/2021 0955    ----------------------------------------------------------------------------------------------------------------   Imaging Results:    No results found.   Assessment & Plan:  Principal Problem:   Sickle cell pain crisis (HCC) Active Problems:   Chronic pain   Leukocytosis   AKI (acute kidney injury) (HCC)  Symptomatic anemia: Patient endorses fatigue and shortness of breath with exertion.  Admit.  Transfused 2 units PRBCs.  Supplemental oxygen  as needed.  Follow labs in a.m.  Sickle cell disease with pain crisis: Hold IV Dilaudid PCA due to hypotension Oxycodone 10 mg every 4 hours as needed for severe pain IV fluids, 0.45% saline at 75 mL/h Hold IV Toradol due to acute kidney injury Monitor vital signs very closely, reevaluate pain scale regularly, and supplemental oxygen as needed  Hypotension: Hold IV Dilaudid PCA.  Discontinue fentanyl.  Continue IV fluids.  Follow closely.  History of cocaine use: Urine drug screen pending.  Patient denies any current illicit drug  Transaminitis: AST and ALT markedly elevated.  Reviewed hepatic function and hepatitis panel as results become available. Follow labs closely  Leukocytosis: WBCs 13.6.  Patient afebrile.  No signs of acute infection.  Continue to monitor closely.  Labs in AM.  Homelessness: Transition of care consult  DVT Prophylaxis: SCDs  AM Labs Ordered, also please review Full Orders  Family Communication: Admission, patient's condition and plan of care including tests being ordered have been discussed with the patient who indicate understanding and agree with the plan and Code Status.  Code Status: Full Code  Consults called: None    Admission status: Inpatient    Time spent in minutes : 50 minutes   [name]  APRN, MSN, FNP-C Patient Care [facility] [address] [place], [place] [address] [phone]  09/10/2022 at 11:48 AM

## 2022-09-10 NOTE — Telephone Encounter (Signed)
Called Scott Norton to reschedule his appointments. No answer on cell phone. Tried calling home phone number but it goes to a busy signal. Colonoscopy and Previsit have been cancelled. We will await for patient to call back to reschedule.

## 2022-09-10 NOTE — ED Triage Notes (Signed)
BIB EMS due to 3 days of SCC general body pain, not relieved with home meds. 128/67-68-98%

## 2022-09-11 ENCOUNTER — Inpatient Hospital Stay (HOSPITAL_COMMUNITY): Payer: Medicare HMO

## 2022-09-11 LAB — BASIC METABOLIC PANEL
Anion gap: 9 (ref 5–15)
Anion gap: 9 (ref 5–15)
BUN: 76 mg/dL — ABNORMAL HIGH (ref 6–20)
BUN: 77 mg/dL — ABNORMAL HIGH (ref 6–20)
CO2: 15 mmol/L — ABNORMAL LOW (ref 22–32)
CO2: 14 mmol/L — ABNORMAL LOW (ref 22–32)
Calcium: 8.4 mg/dL — ABNORMAL LOW (ref 8.9–10.3)
Calcium: 8.4 mg/dL — ABNORMAL LOW (ref 8.9–10.3)
Chloride: 111 mmol/L (ref 98–111)
Chloride: 110 mmol/L (ref 98–111)
Creatinine, Ser: 5.43 mg/dL — ABNORMAL HIGH (ref 0.61–1.24)
Creatinine, Ser: 5.6 mg/dL — ABNORMAL HIGH (ref 0.61–1.24)
GFR, Estimated: 12 mL/min — ABNORMAL LOW (ref >60)
GFR, Estimated: 11 mL/min — ABNORMAL LOW (ref >60)
Glucose, Bld: 97 mg/dL (ref 70–99)
Glucose, Bld: 102 mg/dL — ABNORMAL HIGH (ref 70–99)
Potassium: 5.7 mmol/L — ABNORMAL HIGH (ref 3.5–5.1)
Potassium: 5.1 mmol/L (ref 3.5–5.1)
Sodium: 135 mmol/L (ref 135–145)
Sodium: 133 mmol/L — ABNORMAL LOW (ref 135–145)

## 2022-09-11 LAB — LACTIC ACID, PLASMA
Lactic Acid, Venous: 1 mmol/L (ref 0.5–1.9)
Lactic Acid, Venous: 1.2 mmol/L (ref 0.5–1.9)

## 2022-09-11 LAB — HEPATIC FUNCTION PANEL
ALT: 137 U/L — ABNORMAL HIGH (ref 0–44)
AST: 194 U/L — ABNORMAL HIGH (ref 15–41)
Albumin: 3.4 g/dL — ABNORMAL LOW (ref 3.5–5.0)
Alkaline Phosphatase: 144 U/L — ABNORMAL HIGH (ref 38–126)
Bilirubin, Direct: 1.5 mg/dL — ABNORMAL HIGH (ref 0.0–0.2)
Indirect Bilirubin: 1.2 mg/dL — ABNORMAL HIGH (ref 0.3–0.9)
Total Bilirubin: 2.7 mg/dL — ABNORMAL HIGH (ref 0.3–1.2)
Total Protein: 6.8 g/dL (ref 6.5–8.1)

## 2022-09-11 LAB — CBC
HCT: 19.4 % — ABNORMAL LOW (ref 39.0–52.0)
Hemoglobin: 7 g/dL — ABNORMAL LOW (ref 13.0–17.0)
MCH: 29 pg (ref 26.0–34.0)
MCHC: 36.1 g/dL — ABNORMAL HIGH (ref 30.0–36.0)
MCV: 80.5 fL (ref 80.0–100.0)
Platelets: 139 10*3/uL — ABNORMAL LOW (ref 150–400)
RBC: 2.41 MIL/uL — ABNORMAL LOW (ref 4.22–5.81)
RDW: 24.4 % — ABNORMAL HIGH (ref 11.5–15.5)
WBC: 11 10*3/uL — ABNORMAL HIGH (ref 4.0–10.5)
nRBC: 28 % — ABNORMAL HIGH (ref 0.0–0.2)

## 2022-09-11 LAB — PROCALCITONIN: Procalcitonin: 2.08 ng/mL

## 2022-09-11 LAB — SARS CORONAVIRUS 2 BY RT PCR: SARS Coronavirus 2 by RT PCR: NEGATIVE

## 2022-09-11 LAB — LACTATE DEHYDROGENASE: LDH: 1252 U/L — ABNORMAL HIGH (ref 98–192)

## 2022-09-11 LAB — HIV ANTIBODY (ROUTINE TESTING W REFLEX): HIV Screen 4th Generation wRfx: NONREACTIVE

## 2022-09-11 LAB — PREPARE RBC (CROSSMATCH)

## 2022-09-11 MED ORDER — ACETAMINOPHEN 325 MG PO TABS
650.0000 mg | ORAL_TABLET | ORAL | Status: DC | PRN
  Administered 2022-09-11 – 2022-09-12 (×2): 650 mg via ORAL
  Filled 2022-09-11 (×2): qty 2

## 2022-09-11 MED ORDER — CHLORHEXIDINE GLUCONATE CLOTH 2 % EX PADS
6.0000 | MEDICATED_PAD | Freq: Every day | CUTANEOUS | Status: DC
  Administered 2022-09-11: 6 via TOPICAL

## 2022-09-11 MED ORDER — ACETAMINOPHEN 325 MG PO TABS
650.0000 mg | ORAL_TABLET | Freq: Once | ORAL | Status: AC
  Administered 2022-09-11: 650 mg via ORAL
  Filled 2022-09-11: qty 2

## 2022-09-11 MED ORDER — SODIUM CHLORIDE 0.9% IV SOLUTION: Freq: Once | INTRAVENOUS | Status: AC

## 2022-09-11 MED ORDER — SODIUM CHLORIDE 0.9 % IV SOLN
2.0000 g | INTRAVENOUS | Status: DC
  Administered 2022-09-11 – 2022-09-13 (×3): 2 g via INTRAVENOUS
  Filled 2022-09-11 (×3): qty 12.5

## 2022-09-11 MED ORDER — DIPHENHYDRAMINE HCL 50 MG/ML IJ SOLN
25.0000 mg | Freq: Once | INTRAMUSCULAR | Status: AC
  Administered 2022-09-11: 25 mg via INTRAVENOUS
  Filled 2022-09-11: qty 1

## 2022-09-11 NOTE — Plan of Care (Signed)
  Problem: Education: Goal: Knowledge of vaso-occlusive preventative measures will improve Outcome: Progressing Goal: Awareness of infection prevention will improve Outcome: Progressing Goal: Awareness of signs and symptoms of anemia will improve Outcome: Progressing Goal: Long-term complications will improve Outcome: Progressing   Problem: Self-Care: Goal: Ability to incorporate actions that prevent/reduce pain crisis will improve Outcome: Progressing   Problem: Bowel/Gastric: Goal: Gut motility will be maintained Outcome: Progressing   Problem: Tissue Perfusion: Goal: Complications related to inadequate tissue perfusion will be avoided or minimized Outcome: Progressing   Problem: Respiratory: Goal: Pulmonary complications will be avoided or minimized Outcome: Not Progressing Note: R/t patient on 5L [place]  Goal: Acute Chest Syndrome will be identified early to prevent complications Outcome: Progressing   Problem: Fluid Volume: Goal: Ability to maintain a balanced intake and output will improve Outcome: Progressing   Problem: Sensory: Goal: Pain level will decrease with appropriate interventions Outcome: Progressing   Problem: Health Behavior: Goal: Postive changes in compliance with treatment and prescription regimens will improve Outcome: Progressing   Problem: Education: Goal: Knowledge of General Education information will improve Description: Including pain rating scale, medication(s)/side effects and non-pharmacologic comfort measures Outcome: Progressing   Problem: Health Behavior/Discharge Planning: Goal: Ability to manage health-related needs will improve Outcome: Progressing   Problem: Clinical Measurements: Goal: Ability to maintain clinical measurements within normal limits will improve Outcome: Progressing Goal: Will remain free from infection Outcome: Progressing Goal: Diagnostic test results will improve Outcome: Progressing Goal: Respiratory  complications will improve Outcome: Progressing Goal: Cardiovascular complication will be avoided Outcome: Progressing   Problem: Activity: Goal: Risk for activity intolerance will decrease Outcome: Progressing   Problem: Nutrition: Goal: Adequate nutrition will be maintained Outcome: Progressing   Problem: Coping: Goal: Level of anxiety will decrease Outcome: Progressing   Problem: Elimination: Goal: Will not experience complications related to bowel motility Outcome: Progressing Goal: Will not experience complications related to urinary retention Outcome: Progressing   Problem: Pain Managment: Goal: General experience of comfort will improve Outcome: Progressing   Problem: Safety: Goal: Ability to remain free from injury will improve Outcome: Progressing   Problem: Skin Integrity: Goal: Risk for impaired skin integrity will decrease Outcome: Progressing   Problem: Activity: Goal: Ability to return to normal activity level will improve to the fullest extent possible by discharge Outcome: Progressing   Problem: Education: Goal: Knowledge of medication regimen will be met for pain relief regimen by discharge Outcome: Progressing Goal: Understanding of ways to prevent infection will improve by discharge Outcome: Progressing   Problem: Coping: Goal: Ability to verbalize feelings will improve by discharge Outcome: Progressing Goal: Family members realistic understanding of the patients condition will improve by discharge Outcome: Progressing   Problem: Fluid Volume: Goal: Maintenance of adequate hydration will improve by discharge Outcome: Progressing   Problem: Medication: Goal: Compliance with prescribed medication regimen will improve by discharge Outcome: Progressing   Problem: Physical Regulation: Goal: Hemodynamic stability will return to baseline for the patient by discharge Outcome: Progressing Goal: Diagnostic test results will improve Outcome:  Progressing Goal: Will remain free from infection Outcome: Progressing   Problem: Respiratory: Goal: Ability to maintain adequate oxygenation and ventilation will improve by discharge Outcome: Not Progressing Note: R/t patient needs for [facility] on room air at home   Problem: Role Relationship: Goal: Ability to identify and utilize available support systems will improve by discharge Outcome: Progressing   Problem: Pain Management: Goal: Satisfaction with pain management regimen will be met by discharge Outcome: Progressing

## 2022-09-11 NOTE — Progress Notes (Signed)
Subjective: Scott Norton is a 56 year old male with a medical history significant for sickle cell disease, chronic pain syndrome, history of anemia of chronic disease, and history of cocaine abuse was admitted for symptomatic anemia in the setting of sickle cell pain crisis. Today, patient appears to have possible decompensation.  Creatinine has trended up overnight to 5.43.  Patient's LDH is markedly elevated at 1252.  Patient is status post 2 units PRBCs on yesterday, today hemoglobin has improved some.  Patient currently febrile with a max temperature of 101.8.  He endorses productive cough and shortness of breath.  He also states that he is "hurting all over", primarily to his joints.  No abdominal pain, urinary symptoms, nausea, vomiting, or diarrhea.  Patient currently has an oxygen requirement of 3 L/min.  Objective:  Vital signs in last 24 hours:  Vitals:   09/11/22 1338 09/11/22 1400 09/11/22 1500 09/11/22 1545  BP: (!) 107/47 (!) 112/55 (!) 102/47   Pulse: 84 77 75   Resp: 18 16 13    Temp:  99 F (37.2 C)  98.2 F (36.8 C)  TempSrc:  Oral  Oral  SpO2: (!) 88% 95% 94%   Weight:      Height:        Intake/Output from previous day:   Intake/Output Summary (Last 24 hours) at 09/11/2022 1554 Last data filed at 09/11/2022 0700 Gross per 24 hour  Intake 2046.09 ml  Output 300 ml  Net 1746.09 ml    Physical Exam: General: Alert, awake, oriented x3, in no acute distress.  HEENT: [place]/AT PEERL, EOMI. Scleral icterus.  Neck: Trachea midline,  no masses, no thyromegal,y no JVD, no carotid bruit OROPHARYNX:  Moist, No exudate/ erythema/lesions. Poor dentition  Heart: Regular rate and rhythm, Murmur present, rubs, gallops, PMI non-displaced, no heaves or thrills on palpation.  Lungs: Rhohchi. No wheezing.   Abdomen: Soft, nontender, nondistended, positive bowel sounds, no masses no hepatosplenomegaly noted..  Neuro: No focal neurological deficits noted cranial nerves II through  XII grossly intact. DTRs 2+ bilaterally upper and lower extremities. Strength 5 out of 5 in bilateral upper and lower extremities. Musculoskeletal: No warm swelling or erythema around joints, no spinal tenderness noted. Psychiatric: Patient alert and oriented x3, good insight and cognition, good recent to remote recall.  Lab Results:  Basic Metabolic Panel:    Component Value Date/Time   NA 135 09/11/2022 0607   NA 137 08/14/2022 1151   K 5.7 (H) 09/11/2022 0607   CL 111 09/11/2022 0607   CO2 15 (L) 09/11/2022 0607   BUN 76 (H) 09/11/2022 0607   BUN 22 08/14/2022 1151   CREATININE 5.43 (H) 09/11/2022 0607   CREATININE 0.90 07/19/2014 1128   GLUCOSE 97 09/11/2022 0607   CALCIUM 8.4 (L) 09/11/2022 0607   CBC:    Component Value Date/Time   WBC 11.0 (H) 09/11/2022 0607   HGB 7.0 (L) 09/11/2022 0607   HGB 6.8 (LL) 08/07/2022 1149   HCT 19.4 (L) 09/11/2022 0607   HCT 21.0 (L) 08/07/2022 1149   PLT 139 (L) 09/11/2022 0607   PLT 217 08/07/2022 1149   MCV 80.5 09/11/2022 0607   MCV 91 08/07/2022 1149   NEUTROABS 8.0 (H) 09/10/2022 1022   NEUTROABS 4.9 08/07/2022 1149   LYMPHSABS 3.5 09/10/2022 1022   LYMPHSABS 3.3 (H) 08/07/2022 1149   MONOABS 1.9 (H) 09/10/2022 1022   EOSABS 0.1 09/10/2022 1022   EOSABS 0.2 08/07/2022 1149   BASOSABS 0.0 09/10/2022 1022  BASOSABS 0.0 08/07/2022 1149    Recent Results (from the past 240 hour(s))  SARS Coronavirus 2 by RT PCR (hospital order, performed in [facility] hospital lab) *cepheid single result test* Anterior Nasal Swab     Status: None   Collection Time: 09/11/22 12:39 PM   Specimen: Anterior Nasal Swab  Result Value Ref Range Status   SARS Coronavirus 2 by RT PCR NEGATIVE NEGATIVE Final    Comment: (NOTE) SARS-CoV-2 target nucleic acids are NOT DETECTED.  The SARS-CoV-2 RNA is generally detectable in upper and lower respiratory specimens during the acute phase of infection. The lowest concentration of SARS-CoV-2 viral copies  this assay can detect is 250 copies / mL. A negative result does not preclude SARS-CoV-2 infection and should not be used as the sole basis for treatment or other patient management decisions.  A negative result may occur with improper specimen collection / handling, submission of specimen other than nasopharyngeal swab, presence of viral mutation(s) within the areas targeted by this assay, and inadequate number of viral copies (<250 copies / mL). A negative result must be combined with clinical observations, patient history, and epidemiological information.  Fact Sheet for Patients:   RoadLapTop.co.za  Fact Sheet for Healthcare Providers: http://kim-miller.com/  This test is not yet approved or  cleared by the [place] FDA and has been authorized for detection and/or diagnosis of SARS-CoV-2 by FDA under an Emergency Use Authorization (EUA).  This EUA will remain in effect (meaning this test can be used) for the duration of the COVID-19 declaration under Section 564(b)(1) of the Act, 21 U.S.C. section 360bbb-3(b)(1), unless the authorization is terminated or revoked sooner.  Performed at [facility], [address], [place], [place] [address]     Studies/Results: US RENAL  Result Date: 09/11/2022 CLINICAL DATA:  Acute kidney injury. EXAM: RENAL / URINARY TRACT ULTRASOUND COMPLETE COMPARISON:  None Available. FINDINGS: Right Kidney: Renal measurements: 10.3 x 5.9 x 6.0 cm = volume: 192 mL. Diffusely increased parenchymal echogenicity evident. No hydronephrosis. Left Kidney: Renal measurements: 10.6 x 6.1 x 6.3 cm = volume: To 114 mL. Diffusely increased parenchymal echogenicity without hydronephrosis. Bladder: Appears normal for degree of bladder distention. Other: None. IMPRESSION: Diffusely increased parenchymal echogenicity of both kidneys, suggesting chronic medical renal disease. No hydronephrosis.  Electronically Signed   By: Kennith Center M.D.   On: 09/11/2022 11:48    Medications: Scheduled Meds:  Chlorhexidine Gluconate Cloth  6 each Topical Daily   folic acid  1 mg Oral Daily   senna-docusate  1 tablet Oral BID   Continuous Infusions:  sodium chloride 150 mL/hr at 09/11/22 1358   ceFEPime (MAXIPIME) IV 2 g (09/11/22 1214)   PRN Meds:.acetaminophen, mouth rinse, oxyCODONE, polyethylene glycol  Consultants: Nephrology  Procedures: None  Antibiotics: IV Cefepime, dosing per pharmacy  Assessment/Plan: Principal Problem:   Sickle cell pain crisis (HCC) Active Problems:   Chronic pain   Thrombocytopenia (HCC)   Leukocytosis   AKI (acute kidney injury) (HCC)  Probable sepsis: Overnight, patient has been febrile with max temperature of 101.8.  He has an oxygen requirement of 3 L/min.  WBCs slightly elevated.  Hemoglobin 7.0 g/dL, which is improved from 5.5 on admission.  Initiate empiric antibiotics.  Continue supplemental oxygen at 3 L.  Blood cultures x2.  Review procalcitonin as results become available. Transfer to stepdown for higher level of care.  Acute kidney injury: Worsening renal functioning.  Creatinine has trended up overnight to 5.43<4.68.  Fluid resuscitation.  Renal ultrasound.  Patient may warrant consult to nephrology.  Refrain from any nephrotoxic medications.  Hyperkalemia: Potassium elevated at 5.7.  Fluid resuscitation for right now.  Will repeat potassium level, if it remains elevated will consider Lokelma.  Sickle cell disease with pain crisis: Continue pain control with oxycodone 10 mg every 4 hours as needed Patient has not warranted IV pain medications overnight Continue IV fluids Hold Toradol due to worsening renal function Monitor vital signs very closely.  Patient will be transferred to stepdown.  Transaminitis: Transaminases trending down.  Secondary to vaso-occlusive crisis.  We will continue to monitor closely.  No hepatotoxic  medications.  Symptomatic anemia: Today, patient's hemoglobin is 7.0 g/dL.  Patient has a productive cough and some shortness of breath.  Chest x-ray pending.  We will consider exchange transfusion    Code Status: Full Code Family Communication: N/A Disposition Plan: Not yet ready for discharge   [place], MSN, FNP-C Patient [place],  [id number] [phone]  If 7PM-7AM, please contact night-coverage.  09/11/2022, 3:54 PM  LOS: 1 day

## 2022-09-11 NOTE — Progress Notes (Signed)
1910 patient alert x4 able to make all needs known patient on 5L [place] reviewed the plan of care waiting on blood to transfuse at this time 2000 patient has on personal pajama pants and hospital gown patient refusing to allow entire skin assessment patient report he got a bath this morning and his skin is dry sween ointment from stock room given  to patient to apply to skin for moisture. Patient reports having a scratch to groin area using neosporin at home he applied sween to that area but would not let me exam the area. 2055 first unit of blood started pre meds given  09/12/2022 0031 BP changed to wrist patient will not lay flat or lower arm for BP cuff is on left arm and he's lying on his right side BP is not accurate 0053 first into PRBCs completed no concerns noted  0113 Second unit PRBCs started  0225 lab asked to come back later patient getting blood at this time 0332 2nd unit PRBCs completed  0345 patient got urine on pajama pants refusing to take them off and to wipe down, he was offered paper pants from ED but he didn't want them. Patient asked for dry towel to tuck into pajama pants so nothing wet would touch his skin. 2751 lab here for AM labs patient does not want to use same area they are able to get blood due to it being sore so lab will send another technician to collect AM labs

## 2022-09-11 NOTE — Significant Event (Signed)
Rapid Response Event Note   Reason for Call :  Patient status changed to stepdown, assessing for appropriate level of care.   Initial Focused Assessment:  Patient Alert and Oriented, not complaining of pain.  Patient Spo2 95% on 3L nasal canula, patient respirations not labored. Heart rate regular, and in a normal sinus rhythm.   Patients labs are concerning for possible decompensation.  Creatinine trending up 5.43, BUN trending up 76, LDH 1,252. Potassium at 5.7 as of 0607  Interventions:  Notified Julianne Handler of unstable labs  Plan of Care:  Transfer to stepdown unit, nephology consult   No other orders at this time. Event Summary:   MD Notified: Michael Boston, RN

## 2022-09-12 ENCOUNTER — Encounter (HOSPITAL_COMMUNITY): Payer: Self-pay | Admitting: Family Medicine

## 2022-09-12 DIAGNOSIS — D649 Anemia, unspecified: Secondary | ICD-10-CM | POA: Diagnosis not present

## 2022-09-12 DIAGNOSIS — D5700 Hb-SS disease with crisis, unspecified: Secondary | ICD-10-CM | POA: Diagnosis not present

## 2022-09-12 DIAGNOSIS — N179 Acute kidney failure, unspecified: Secondary | ICD-10-CM | POA: Diagnosis not present

## 2022-09-12 DIAGNOSIS — E440 Moderate protein-calorie malnutrition: Secondary | ICD-10-CM | POA: Insufficient documentation

## 2022-09-12 LAB — BASIC METABOLIC PANEL
Anion gap: 6 (ref 5–15)
Anion gap: 7 (ref 5–15)
BUN: 69 mg/dL — ABNORMAL HIGH (ref 6–20)
BUN: 58 mg/dL — ABNORMAL HIGH (ref 6–20)
CO2: 16 mmol/L — ABNORMAL LOW (ref 22–32)
CO2: 16 mmol/L — ABNORMAL LOW (ref 22–32)
Calcium: 8.7 mg/dL — ABNORMAL LOW (ref 8.9–10.3)
Calcium: 9 mg/dL (ref 8.9–10.3)
Chloride: 113 mmol/L — ABNORMAL HIGH (ref 98–111)
Chloride: 115 mmol/L — ABNORMAL HIGH (ref 98–111)
Creatinine, Ser: 4.06 mg/dL — ABNORMAL HIGH (ref 0.61–1.24)
Creatinine, Ser: 3.41 mg/dL — ABNORMAL HIGH (ref 0.61–1.24)
GFR, Estimated: 17 mL/min — ABNORMAL LOW (ref >60)
GFR, Estimated: 20 mL/min — ABNORMAL LOW (ref >60)
Glucose, Bld: 85 mg/dL (ref 70–99)
Glucose, Bld: 95 mg/dL (ref 70–99)
Potassium: 5.2 mmol/L — ABNORMAL HIGH (ref 3.5–5.1)
Potassium: 5.2 mmol/L — ABNORMAL HIGH (ref 3.5–5.1)
Sodium: 135 mmol/L (ref 135–145)
Sodium: 138 mmol/L (ref 135–145)

## 2022-09-12 LAB — URINE CULTURE: Culture: <10000 — AB

## 2022-09-12 LAB — CBC
HCT: 24.7 % — ABNORMAL LOW (ref 39.0–52.0)
Hemoglobin: 8.4 g/dL — ABNORMAL LOW (ref 13.0–17.0)
MCH: 27.6 pg (ref 26.0–34.0)
MCHC: 34 g/dL (ref 30.0–36.0)
MCV: 81.3 fL (ref 80.0–100.0)
Platelets: 112 10*3/uL — ABNORMAL LOW (ref 150–400)
RBC: 3.04 MIL/uL — ABNORMAL LOW (ref 4.22–5.81)
RDW: 23.4 % — ABNORMAL HIGH (ref 11.5–15.5)
WBC: 11.9 10*3/uL — ABNORMAL HIGH (ref 4.0–10.5)
nRBC: 21.2 % — ABNORMAL HIGH (ref 0.0–0.2)

## 2022-09-12 LAB — PROCALCITONIN: Procalcitonin: 1.13 ng/mL

## 2022-09-12 MED ORDER — ENSURE ENLIVE PO LIQD
237.0000 mL | Freq: Two times a day (BID) | ORAL | Status: DC
  Administered 2022-09-13: 237 mL via ORAL

## 2022-09-12 NOTE — TOC Initial Note (Signed)
Transition of Care [facility]) - Initial/Assessment Note    Patient Details  Name: Scott Norton MRN: [id number] Date of Birth: January 14, 1966  Transition of Care [facility]) CM/SW Contact:    Golda Acre, RN Phone Number: 09/12/2022, 7:15 AM  Clinical Narrative:                 Jory Sims Dementia Note   Patient Details  Name: Scott Norton Date of Birth: 04-Apr-1966 09/12/2022, 7:15 AM   To Whom It May Concern:  Transition of Care [facility]) Screening Note   Patient Details  Name: [name] Date of Birth: May 24, 1966   Transition of Care [facility]) CM/SW Contact:    Golda Acre, RN Phone Number: 09/12/2022, 7:15 AM    Transition of Care Department [facility]) has reviewed patient and no TOC needs have been identified at this time. We will continue to monitor patient advancement through interdisciplinary progression rounds. If new patient transition needs arise, please place a TOC consult.     Transition of Care [facility]) CM/SW Contact: Golda Acre, RN Phone Number: 09/12/2022, 7:15 AM   Expected Discharge Plan: Home/Self Care Barriers to Discharge: Continued Medical Work up   Patient Goals and CMS Choice Patient states their goals for this hospitalization and ongoing recovery are:: to return home CMS Medicare.gov Compare Post Acute Care list provided to:: Patient    Expected Discharge Plan and Services Expected Discharge Plan: Home/Self Care   Discharge Planning Services: CM Consult   Living arrangements for the past 2 months: Single Family Home                                      Prior Living Arrangements/Services Living arrangements for the past 2 months: Single Family Home Lives with:: Self Patient language and need for interpreter reviewed:: Yes Do you feel safe going back to the place where you live?: Yes            Criminal Activity/Legal Involvement Pertinent to Current Situation/Hospitalization: No - Comment as needed  Activities of Daily  Living Home Assistive Devices/Equipment: Eyeglasses (reading glasses) ADL Screening (condition at time of admission) Patient's cognitive ability adequate to safely complete daily activities?: Yes Is the patient deaf or have difficulty hearing?: No Does the patient have difficulty seeing, even when wearing glasses/contacts?: No Does the patient have difficulty concentrating, remembering, or making decisions?: Yes Patient able to express need for assistance with ADLs?: Yes Does the patient have difficulty dressing or bathing?: No Independently performs ADLs?: Yes (appropriate for developmental age) Does the patient have difficulty walking or climbing stairs?: No Weakness of Legs: Both Weakness of Arms/Hands: Both  Permission Sought/Granted                  Emotional Assessment Appearance:: Appears stated age Attitude/Demeanor/Rapport: Engaged Affect (typically observed): Calm Orientation: : Oriented to Self, Oriented to Place, Oriented to  Time, Oriented to Situation Alcohol / Substance Use: Tobacco Use, Alcohol Use (occasonialy smokes and drinks etoh occasionally) Psych Involvement: No (comment)  Admission diagnosis:  Hemolytic crisis [D64.9] AKI (acute kidney injury) (HCC) [N17.9] Sickle cell pain crisis (HCC) [D57.00] Patient Active Problem List   Diagnosis Date Noted   Acute hypoxemic respiratory failure (HCC) 06/10/2021   Sickle cell anemia with crisis (HCC) 06/10/2021   Suicidal ideation    Acute exacerbation of CHF (congestive heart failure) (HCC) 03/13/2021   Depression 03/01/2021   Cocaine abuse with  cocaine-induced mood disorder ([place]) 03/01/2021   Sickle-cell crisis ([place]) 02/08/2021   Hyperkalemia 02/08/2021   Thrombocytopenia ([place]) 02/08/2021   Leukocytosis 02/08/2021   AKI (acute kidney injury) ([place]) 02/08/2021   Cocaine abuse ([place]) 02/08/2021   Homelessness 09/14/2020   Suicidal ideations 09/04/2020   Tobacco use 09/04/2020   Sickle cell pain crisis ([place])  04/11/2015   Vitamin D deficiency 01/31/2015   Chronic pain 06/30/2014   Respiratory infection 08/13/2012   Healthcare maintenance 06/04/2012   Cervical strain, acute 12/09/2011   Neck sprain and strain 12/09/2011   Sickle cell anemia ([place]) 11/29/2011   Seasonal allergies 11/29/2011   Seasonal allergic rhinitis 11/29/2011   PCP:  Dorena Dew, FNP Pharmacy:   [facility], [place] [address] Phone: [phone] Fax: [place] [address] Phone: [phone] Fax: [phone]  [facility] [phone] - [place], [place] [address] [place] [address] Phone: [phone] Fax: [phone]     Social Determinants of Health (SDOH) Interventions    Readmission Risk Interventions   Row Labels 03/14/2021    9:31 AM  Readmission Risk Prevention Plan   Section Header. No data exists in this row.   Transportation Screening   Complete  Medication Review Press photographer)   Complete  PCP or Specialist appointment within 3-5 days of discharge   Complete  HRI or [place]   Complete  SW Recovery Care/Counseling Consult   Complete  [place]   Not Applicable

## 2022-09-12 NOTE — Progress Notes (Signed)
Initial Nutrition Assessment  DOCUMENTATION CODES:   Non-severe (moderate) malnutrition in context of chronic illness  INTERVENTION:  - Continue Regular diet.  - Ensure Enlive po BID, each supplement provides 350 kcal and 20 grams of protein.   NUTRITION DIAGNOSIS:   Moderate Malnutrition related to chronic illness (sickle cell disease, chronic pain syndrome, drug use) as evidenced by mild fat depletion, mild muscle depletion.  GOAL:   Patient will meet greater than or equal to 90% of their needs  MONITOR:   PO intake, Supplement acceptance, Weight trends  REASON FOR ASSESSMENT:   Malnutrition Screening Tool    ASSESSMENT:   56 y.o. male with a PMH sickle cell disease, chronic pain syndrome, opiate dependence and tolerance, anemia of chronic disease, history of cocaine abuse who presented with complaints of ongoing fatigue and allover body pain over the past 3 days.   Patient reports UBW of 168# and denied changes in weight until the past 3 days when he feels he may have lost weight due to not eating. Per EMR pt's weight without significant changes in the past year. Patient reports he typically eats well and eats "whatever  is in the fridge that needs to be eaten". Notes good appetite at baseline. Has not had anything to eat the past 3 days PTA due to feeling poorly. However, pt notes he ate well at dinner last night and had some sausage and some of waffle for breakfast this morning. He knows he needs to eat well and is hoping he will be able to eat well once feeling better. Enjoys Ensure and agreeable to receive during admission to support intake.    Medications reviewed and include: Folic acid, senokot  Labs reviewed:  K+ 5.2 Creatinine 4.06   NUTRITION - FOCUSED PHYSICAL EXAM:  Flowsheet Row Most Recent Value  Orbital Region Mild depletion  Upper Arm Region Mild depletion  Thoracic and Lumbar Region Mild depletion  Buccal Region Mild depletion  Temple Region  Moderate depletion  Clavicle Bone Region Mild depletion  Clavicle and Acromion Bone Region Mild depletion  Scapular Bone Region Unable to assess  Dorsal Hand No depletion  Patellar Region Mild depletion  Anterior Thigh Region Mild depletion  Posterior Calf Region Mild depletion  Edema (RD Assessment) None  Hair Reviewed  Eyes Reviewed  Mouth Reviewed  Skin Reviewed  Nails Reviewed       Diet Order:   Diet Order             Diet regular Room service appropriate? Yes  Diet effective now                   EDUCATION NEEDS:   No education needs have been identified at this time  Skin:  Skin Assessment: Reviewed RN Assessment  Last BM:  11/20 per pt  Height:  Ht Readings from Last 1 Encounters:  09/10/22 6\' 3"  (1.905 m)   Weight:  Wt Readings from Last 1 Encounters:  09/10/22 76.2 kg    BMI:  Body mass index is 21 kg/m.  Estimated Nutritional Needs:  Kcal:  2300-2500 kcal Protein:  105-120 grams Fluid:  >/= 2.3L    09/12/22 RD, LDN For contact information, refer to [facility].

## 2022-09-12 NOTE — Plan of Care (Signed)
  Problem: Education: Goal: Knowledge of vaso-occlusive preventative measures will improve Outcome: Progressing Goal: Awareness of infection prevention will improve Outcome: Progressing Goal: Awareness of signs and symptoms of anemia will improve Outcome: Progressing Goal: Long-term complications will improve Outcome: Progressing   Problem: Self-Care: Goal: Ability to incorporate actions that prevent/reduce pain crisis will improve Outcome: Progressing   Problem: Bowel/Gastric: Goal: Gut motility will be maintained Outcome: Progressing   Problem: Tissue Perfusion: Goal: Complications related to inadequate tissue perfusion will be avoided or minimized Outcome: Progressing   Problem: Respiratory: Goal: Pulmonary complications will be avoided or minimized Outcome: Progressing Goal: Acute Chest Syndrome will be identified early to prevent complications Outcome: Progressing   Problem: Fluid Volume: Goal: Ability to maintain a balanced intake and output will improve Outcome: Progressing   Problem: Sensory: Goal: Pain level will decrease with appropriate interventions Outcome: Progressing   Problem: Health Behavior: Goal: Postive changes in compliance with treatment and prescription regimens will improve Outcome: Progressing   Problem: Education: Goal: Knowledge of General Education information will improve Description: Including pain rating scale, medication(s)/side effects and non-pharmacologic comfort measures Outcome: Progressing   Problem: Health Behavior/Discharge Planning: Goal: Ability to manage health-related needs will improve Outcome: Progressing   Problem: Clinical Measurements: Goal: Ability to maintain clinical measurements within normal limits will improve Outcome: Progressing Goal: Will remain free from infection Outcome: Progressing Goal: Diagnostic test results will improve Outcome: Progressing Goal: Respiratory complications will improve Outcome:  Progressing Goal: Cardiovascular complication will be avoided Outcome: Progressing   Problem: Activity: Goal: Risk for activity intolerance will decrease Outcome: Progressing   Problem: Nutrition: Goal: Adequate nutrition will be maintained Outcome: Progressing   Problem: Coping: Goal: Level of anxiety will decrease Outcome: Progressing   Problem: Elimination: Goal: Will not experience complications related to bowel motility Outcome: Progressing Goal: Will not experience complications related to urinary retention Outcome: Progressing   Problem: Pain Managment: Goal: General experience of comfort will improve Outcome: Progressing   Problem: Safety: Goal: Ability to remain free from injury will improve Outcome: Progressing   Problem: Skin Integrity: Goal: Risk for impaired skin integrity will decrease Outcome: Progressing   Problem: Activity: Goal: Ability to return to normal activity level will improve to the fullest extent possible by discharge Outcome: Progressing   Problem: Education: Goal: Knowledge of medication regimen will be met for pain relief regimen by discharge Outcome: Progressing Goal: Understanding of ways to prevent infection will improve by discharge Outcome: Progressing   Problem: Coping: Goal: Ability to verbalize feelings will improve by discharge Outcome: Progressing Goal: Family members realistic understanding of the patients condition will improve by discharge Outcome: Progressing   Problem: Fluid Volume: Goal: Maintenance of adequate hydration will improve by discharge Outcome: Progressing   Problem: Medication: Goal: Compliance with prescribed medication regimen will improve by discharge Outcome: Progressing   Problem: Physical Regulation: Goal: Hemodynamic stability will return to baseline for the patient by discharge Outcome: Progressing Goal: Diagnostic test results will improve Outcome: Progressing Goal: Will remain free from  infection Outcome: Progressing   Problem: Respiratory: Goal: Ability to maintain adequate oxygenation and ventilation will improve by discharge Outcome: Progressing   Problem: Role Relationship: Goal: Ability to identify and utilize available support systems will improve by discharge Outcome: Progressing   Problem: Pain Management: Goal: Satisfaction with pain management regimen will be met by discharge Outcome: Progressing

## 2022-09-12 NOTE — Progress Notes (Signed)
Patient ID: Zisha Montjoy, male   DOB: [birth year], 56 y.o.   MRN: [id number] Subjective: Crhistopher Waltner is a 56 year old male with a medical history significant for sickle cell disease, chronic pain syndrome, history of anemia of chronic disease, and history of cocaine abuse was admitted for symptomatic anemia in the setting of sickle cell pain crisis.    Today patient has no new complaint but he says he still feeling weak and tired.  Patient is status post blood transfusion of 2 units of packed red blood cell.  He has had a recorded fever of 101.8 F, with productive cough and shortness of breath but seems all of them improving on antibiotics.  Patient has been afebrile for about 24 hours.  Pulse ox he has been consistently above 94%.  Objective:  Vital signs in last 24 hours:  Vitals:   09/12/22 1100 09/12/22 1129 09/12/22 1400 09/12/22 1500  BP: (!) 140/70  (!) 118/44 116/64  Pulse:      Resp: 18  19 16   Temp:  97.7 F (36.5 C)    TempSrc:  Oral    SpO2:      Weight:      Height:        Intake/Output from previous day:   Intake/Output Summary (Last 24 hours) at 09/12/2022 1659 Last data filed at 09/12/2022 1500 Gross per 24 hour  Intake 5040.89 ml  Output 3125 ml  Net 1915.89 ml    Physical Exam: General: Alert, awake, oriented x3, in no acute distress but chronically ill looking.  Disheveled. HEENT: [place]/AT PEERL, EOMI Neck: Trachea midline,  no masses, no thyromegal,y no JVD, no carotid bruit OROPHARYNX:  Moist, No exudate/ erythema/lesions.  Heart: Regular rate and rhythm, with systolic murmurs, no rubs, gallops, PMI non-displaced, no heaves or thrills on palpation.  Lungs: Reduced air entry plus rhonchi but no rales noted. No increased vocal fremitus resonant to percussion  Abdomen: Soft, nontender, nondistended, positive bowel sounds, no masses no hepatosplenomegaly noted..  Neuro: No focal neurological deficits noted cranial nerves II through XII grossly intact. DTRs 2+  bilaterally upper and lower extremities. Strength 5 out of 5 in bilateral upper and lower extremities. Musculoskeletal: No warm swelling or erythema around joints, no spinal tenderness noted. Psychiatric: Patient alert and oriented x3, good insight and cognition, good recent to remote recall. Lymph node survey: No cervical axillary or inguinal lymphadenopathy noted.  Lab Results:  Basic Metabolic Panel:    Component Value Date/Time   NA 138 09/12/2022 1528   NA 137 08/14/2022 1151   K 5.2 (H) 09/12/2022 1528   CL 115 (H) 09/12/2022 1528   CO2 16 (L) 09/12/2022 1528   BUN 58 (H) 09/12/2022 1528   BUN 22 08/14/2022 1151   CREATININE 3.41 (H) 09/12/2022 1528   CREATININE 0.90 07/19/2014 1128   GLUCOSE 95 09/12/2022 1528   CALCIUM 9.0 09/12/2022 1528   CBC:    Component Value Date/Time   WBC 11.9 (H) 09/12/2022 0720   HGB 8.4 (L) 09/12/2022 0720   HGB 6.8 (LL) 08/07/2022 1149   HCT 24.7 (L) 09/12/2022 0720   HCT 21.0 (L) 08/07/2022 1149   PLT 112 (L) 09/12/2022 0720   PLT 217 08/07/2022 1149   MCV 81.3 09/12/2022 0720   MCV 91 08/07/2022 1149   NEUTROABS 8.0 (H) 09/10/2022 1022   NEUTROABS 4.9 08/07/2022 1149   LYMPHSABS 3.5 09/10/2022 1022   LYMPHSABS 3.3 (H) 08/07/2022 1149   MONOABS 1.9 (H) 09/10/2022 1022  EOSABS 0.1 09/10/2022 1022   EOSABS 0.2 08/07/2022 1149   BASOSABS 0.0 09/10/2022 1022   BASOSABS 0.0 08/07/2022 1149    Recent Results (from the past 240 hour(s))  Urine Culture     Status: Abnormal   Collection Time: 09/11/22 11:32 AM   Specimen: Urine, Clean Catch  Result Value Ref Range Status   Specimen Description   Final    URINE, CLEAN CATCH Performed at [facility], [place] [address], [place], [place] [address]    Special Requests   Final    NONE Performed at [facility], [place] [address] [place], [place] [address]    Culture (A)  Final    <10,000 COLONIES/mL INSIGNIFICANT GROWTH Performed at [place] [address], [place], [place] [address]    Report Status 09/12/2022 FINAL  Final  Culture, blood (Routine X 2) w Reflex to ID Panel     Status: None (Preliminary result)   Collection Time: 09/11/22 11:37 AM   Specimen: BLOOD LEFT ARM  Result Value Ref Range Status   Specimen Description   Final    BLOOD LEFT ARM Performed at [place] [address], [place], [place] [address]    Special Requests   Final    BOTTLES DRAWN AEROBIC AND ANAEROBIC Blood Culture adequate volume Performed at [place] [address] [place], [place] [address]    Culture   Final    NO GROWTH < 24 HOURS Performed at [place] [address] [place], [place] [address]    Report Status PENDING  Incomplete  Culture, blood (Routine X 2) w Reflex to ID Panel     Status: None (Preliminary result)   Collection Time: 09/11/22 11:54 AM   Specimen: BLOOD RIGHT ARM  Result Value Ref Range Status   Specimen Description   Final    BLOOD RIGHT ARM Performed at [place] [address] [place], [place] [address]    Special Requests   Final    BOTTLES DRAWN AEROBIC AND ANAEROBIC Blood Culture adequate volume Performed at [place] [facility] [address], [place], [place] [address]    Culture   Final    NO GROWTH < 24 HOURS Performed at [place] [address] [place], [place] [address]    Report Status PENDING  Incomplete  SARS Coronavirus 2 by RT PCR (hospital order, performed in [facility] hospital lab) *cepheid single result test* Anterior Nasal Swab     Status: None   Collection Time: 09/11/22 12:39 PM   Specimen: Anterior Nasal Swab  Result Value Ref Range Status   SARS Coronavirus 2 by RT PCR NEGATIVE NEGATIVE Final    Comment: (NOTE) SARS-CoV-2 target nucleic acids are NOT DETECTED.  The SARS-CoV-2 RNA is generally detectable in upper and lower respiratory specimens during the acute  phase of infection. The lowest concentration of SARS-CoV-2 viral copies this assay can detect is 250 copies / mL. A negative result does not preclude SARS-CoV-2 infection and should not be used as the sole basis for treatment or other patient management decisions.  A negative result may occur with improper specimen collection / handling, submission of specimen other than nasopharyngeal swab, presence of viral mutation(s) within the areas targeted by this assay, and inadequate number of viral copies (<250 copies / mL). A negative result must be combined with clinical observations, patient history, and epidemiological information.  Fact  Sheet for Patients:   RoadLapTop.co.za  Fact Sheet for Healthcare Providers: http://kim-miller.com/  This test is not yet approved or  cleared by the [place] FDA and has been authorized for detection and/or diagnosis of SARS-CoV-2 by FDA under an Emergency Use Authorization (EUA).  This EUA will remain in effect (meaning this test can be used) for the duration of the COVID-19 declaration under Section 564(b)(1) of the Act, 21 U.S.C. section 360bbb-3(b)(1), unless the authorization is terminated or revoked sooner.  Performed at [facility], [address], [place], [place] [address]     Studies/Results: DG Chest 2 View  Result Date: 09/11/2022 CLINICAL DATA:  Sickle cell pain crisis. EXAM: CHEST - 2 VIEW COMPARISON:  CT chest and chest x-ray dated June 18, 2021. FINDINGS: Cardiomegaly. Mild diffuse interstitial thickening. Right-greater-than-left lower lobe consolidation. No pleural effusion or pneumothorax. No acute osseous abnormality. IMPRESSION: 1. Right-greater-than-left lower lobe consolidation, concerning for acute chest syndrome and/or pneumonia. 2. Mild interstitial pulmonary edema. Electronically Signed   By: Obie Dredge M.D.   On: 09/11/2022 16:11   US  RENAL  Result Date: 09/11/2022 CLINICAL DATA:  Acute kidney injury. EXAM: RENAL / URINARY TRACT ULTRASOUND COMPLETE COMPARISON:  None Available. FINDINGS: Right Kidney: Renal measurements: 10.3 x 5.9 x 6.0 cm = volume: 192 mL. Diffusely increased parenchymal echogenicity evident. No hydronephrosis. Left Kidney: Renal measurements: 10.6 x 6.1 x 6.3 cm = volume: To 114 mL. Diffusely increased parenchymal echogenicity without hydronephrosis. Bladder: Appears normal for degree of bladder distention. Other: None. IMPRESSION: Diffusely increased parenchymal echogenicity of both kidneys, suggesting chronic medical renal disease. No hydronephrosis. Electronically Signed   By: Kennith Center M.D.   On: 09/11/2022 11:48    Medications: Scheduled Meds:  Chlorhexidine Gluconate Cloth  6 each Topical Daily   feeding supplement  237 mL Oral BID BM   folic acid  1 mg Oral Daily   senna-docusate  1 tablet Oral BID   Continuous Infusions:  sodium chloride 150 mL/hr at 09/12/22 1500   ceFEPime (MAXIPIME) IV Stopped (09/12/22 1237)   PRN Meds:.acetaminophen, mouth rinse, oxyCODONE, polyethylene glycol  Consultants: None. Discontinued nephrology consult  Procedures: None  Antibiotics: IV cefepime  Assessment/Plan: Principal Problem:   Sickle cell pain crisis (HCC) Active Problems:   Chronic pain   Thrombocytopenia (HCC)   Leukocytosis   AKI (acute kidney injury) (HCC)   Malnutrition of moderate degree  Sepsis: Patient with fever, chest x-ray showing possible pneumonia versus acute chest syndrome, hypoxia and hypoxemia, leukocytosis, acute kidney injury from overwhelming sepsis and dehydration.  Patient is on IV cefepime.  We will continue antibiotics, continue supplemental oxygen.  Await final result of blood culture.   Acute kidney injury: Renal function is responding to the exchange transfusion and hydration. Continue hydration.  Repeat labs in AM. Transaminitis: Most likely due to sepsis and  vaso-occlusive crisis.  Fortunately, transaminases are trending down.  We will continue to monitor closely.  Repeat labs in AM. Hb Sickle Cell Disease with Pain crisis: Continue IVF 0.45% Saline @150  mls/hour, continue oxycodone 10 mg every 4 hours as needed for pain.  IV Toradol is contraindicated due to acute kidney failure. Monitor vitals very closely, Re-evaluate pain scale regularly, 2 L of Oxygen by [place].  Transfer patient out of ICU to MedSurg floor. Leukocytosis:  Symptomatic anemia: Patient had exchange transfusion again yesterday, hemoglobin is improved to 8.4 this morning.  We will continue to monitor closely. Hyperkalemia: Potassium level now better than before, 5.2  this morning.  We will consider Lokelma if potassium level remains elevated in the morning.  Code Status: Full Code Family Communication: N/A Disposition Plan: Not yet ready for discharge  Vivan Agostino  If 7PM-7AM, please contact night-coverage.  09/12/2022, 4:59 PM  LOS: 2 days

## 2022-09-13 DIAGNOSIS — Z5900 Homelessness unspecified: Secondary | ICD-10-CM | POA: Diagnosis not present

## 2022-09-13 DIAGNOSIS — N179 Acute kidney failure, unspecified: Secondary | ICD-10-CM | POA: Diagnosis not present

## 2022-09-13 DIAGNOSIS — R531 Weakness: Secondary | ICD-10-CM

## 2022-09-13 DIAGNOSIS — D649 Anemia, unspecified: Secondary | ICD-10-CM | POA: Diagnosis not present

## 2022-09-13 DIAGNOSIS — D5700 Hb-SS disease with crisis, unspecified: Secondary | ICD-10-CM | POA: Diagnosis not present

## 2022-09-13 LAB — TYPE AND SCREEN
ABO/RH(D): O POS
Antibody Screen: NEGATIVE
Donor AG Type: NEGATIVE
Donor AG Type: NEGATIVE
Donor AG Type: NEGATIVE
Donor AG Type: NEGATIVE
Unit division: 0
Unit division: 0
Unit division: 0
Unit division: 0

## 2022-09-13 LAB — COMPREHENSIVE METABOLIC PANEL
ALT: 82 U/L — ABNORMAL HIGH (ref 0–44)
AST: 113 U/L — ABNORMAL HIGH (ref 15–41)
Albumin: 3.5 g/dL (ref 3.5–5.0)
Alkaline Phosphatase: 156 U/L — ABNORMAL HIGH (ref 38–126)
Anion gap: 7 (ref 5–15)
BUN: 49 mg/dL — ABNORMAL HIGH (ref 6–20)
CO2: 16 mmol/L — ABNORMAL LOW (ref 22–32)
Calcium: 8.9 mg/dL (ref 8.9–10.3)
Chloride: 114 mmol/L — ABNORMAL HIGH (ref 98–111)
Creatinine, Ser: 2.65 mg/dL — ABNORMAL HIGH (ref 0.61–1.24)
GFR, Estimated: 28 mL/min — ABNORMAL LOW (ref >60)
Glucose, Bld: 87 mg/dL (ref 70–99)
Potassium: 5.1 mmol/L (ref 3.5–5.1)
Sodium: 137 mmol/L (ref 135–145)
Total Bilirubin: 4.1 mg/dL — ABNORMAL HIGH (ref 0.3–1.2)
Total Protein: 6.6 g/dL (ref 6.5–8.1)

## 2022-09-13 LAB — BPAM RBC
Blood Product Expiration Date: 202312182359
Blood Product Expiration Date: 202312202359
Blood Product Expiration Date: 202312202359
Blood Product Expiration Date: 202312202359
ISSUE DATE / TIME: 202311201330
ISSUE DATE / TIME: 202311201648
ISSUE DATE / TIME: 202311212147
ISSUE DATE / TIME: 202311220058
Unit Type and Rh: 5100
Unit Type and Rh: 5100
Unit Type and Rh: 5100
Unit Type and Rh: 5100

## 2022-09-13 LAB — CBC WITH DIFFERENTIAL/PLATELET
Abs Immature Granulocytes: 0.09 10*3/uL — ABNORMAL HIGH (ref 0.00–0.07)
Basophils Absolute: 0 10*3/uL (ref 0.0–0.1)
Basophils Relative: 0 %
Eosinophils Absolute: 0.3 10*3/uL (ref 0.0–0.5)
Eosinophils Relative: 3 %
HCT: 24.8 % — ABNORMAL LOW (ref 39.0–52.0)
Hemoglobin: 8.3 g/dL — ABNORMAL LOW (ref 13.0–17.0)
Immature Granulocytes: 1 %
Lymphocytes Relative: 20 %
Lymphs Abs: 2 10*3/uL (ref 0.7–4.0)
MCH: 27.3 pg (ref 26.0–34.0)
MCHC: 33.5 g/dL (ref 30.0–36.0)
MCV: 81.6 fL (ref 80.0–100.0)
Monocytes Absolute: 1.6 10*3/uL — ABNORMAL HIGH (ref 0.1–1.0)
Monocytes Relative: 16 %
Neutro Abs: 5.8 10*3/uL (ref 1.7–7.7)
Neutrophils Relative %: 60 %
Platelets: 116 10*3/uL — ABNORMAL LOW (ref 150–400)
RBC: 3.04 MIL/uL — ABNORMAL LOW (ref 4.22–5.81)
RDW: 25.1 % — ABNORMAL HIGH (ref 11.5–15.5)
WBC: 9.8 10*3/uL (ref 4.0–10.5)
nRBC: 25.5 % — ABNORMAL HIGH (ref 0.0–0.2)

## 2022-09-13 LAB — PROCALCITONIN: Procalcitonin: 0.75 ng/mL

## 2022-09-13 LAB — LACTATE DEHYDROGENASE: LDH: 1163 U/L — ABNORMAL HIGH (ref 98–192)

## 2022-09-13 MED ORDER — PHENOL 1.4 % MT LIQD
1.0000 | OROMUCOSAL | Status: DC | PRN
  Filled 2022-09-13: qty 177

## 2022-09-13 NOTE — Progress Notes (Signed)
Pharmacy Antibiotic Note  Scott Norton is a 56 y.o. male admitted on 09/10/2022 with sepsis.  Pharmacy has been consulted for cefepime dosing.  Day 3 Cefepime WBC trending down SCr improving Afebrile Cultures no growth to date  Plan: Continue cefepime 2g IV q24 per current renal function  Height: 6\' 3"  (190.5 cm) Weight: 76.2 kg (168 lb) IBW/kg (Calculated) : 84.5  Temp (24hrs), Avg:97.7 F (36.5 C), Min:97.5 F (36.4 C), Max:98 F (36.7 C)  Recent Labs  Lab 09/10/22 1022 09/10/22 2153 09/11/22 0607 09/11/22 1137 09/11/22 1358 09/12/22 0720 09/12/22 1528  WBC 13.6*  --  11.0*  --   --  11.9*  --   CREATININE 3.58* 4.68* 5.43*  --  5.60* 4.06* 3.41*  LATICACIDVEN  --   --   --  1.0 1.2  --   --     Estimated Creatinine Clearance: 26.4 mL/min (A) (by C-G formula based on SCr of 3.41 mg/dL (H)).    Allergies  Allergen Reactions   Aspirin Other (See Comments)    Increased heart rate   Tape Other (See Comments)    rash   Tramadol Hives and Itching    Thank you for allowing pharmacy to be a part of this patient's care.  09/14/22 09/13/2022 7:20 AM

## 2022-09-13 NOTE — Progress Notes (Signed)
SDOH Interventions Today    Flowsheet Row Most Recent Value  SDOH Interventions   Food Insecurity Interventions Other (Comment)  [Resources added to AVS]  Housing Interventions Other (Comment)  [Resources added to AVS]

## 2022-09-13 NOTE — Progress Notes (Signed)
Patient ID: Scott Norton, male   DOB: [birth year], 56 y.o.   MRN: [id number] Subjective: Scott Norton is a 56 year old male with a medical history significant for sickle cell disease, chronic pain syndrome, history of anemia of chronic disease, and history of cocaine abuse was admitted for symptomatic anemia in the setting of sickle cell pain crisis.    Patient continues to complain of generalized weakness but no pain.  His hemoglobin is staying up at baseline posttransfusion 6.  No more fever recorded.  Saturating between 87 and 91% on room air but goes up to 94% on oxygen.  Patient has not ambulated.  Objective:  Vital signs in last 24 hours:  Vitals:   09/12/22 1813 09/12/22 2207 09/13/22 0214 09/13/22 1022  BP: 118/66 115/64 106/65 109/68  Pulse: (!) 55 66 (!) 58 (!) 50  Resp: 18 18 18 18   Temp: 97.9 F (36.6 C) (!) 97.5 F (36.4 C) (!) 97.5 F (36.4 C) 97.8 F (36.6 C)  TempSrc: Oral Oral Oral Oral  SpO2: (!) 87% 91% 91% 94%  Weight:      Height:        Intake/Output from previous day:   Intake/Output Summary (Last 24 hours) at 09/13/2022 1346 Last data filed at 09/13/2022 0905 Gross per 24 hour  Intake 989.27 ml  Output 1050 ml  Net -60.73 ml     Physical Exam: General: Alert, awake, oriented x3, in no acute distress but chronically ill looking.  Disheveled. HEENT: [place]/AT PEERL, EOMI Neck: Trachea midline,  no masses, no thyromegal,y no JVD, no carotid bruit OROPHARYNX:  Moist, No exudate/ erythema/lesions.  Heart: Regular rate and rhythm, with systolic murmurs, no rubs, gallops, PMI non-displaced, no heaves or thrills on palpation.  Lungs: Reduced air entry plus rhonchi but no rales noted. No increased vocal fremitus resonant to percussion  Abdomen: Soft, nontender, nondistended, positive bowel sounds, no masses no hepatosplenomegaly noted..  Neuro: No focal neurological deficits noted cranial nerves II through XII grossly intact. DTRs 2+ bilaterally upper and lower  extremities. Strength 5 out of 5 in bilateral upper and lower extremities. Musculoskeletal: No warm swelling or erythema around joints, no spinal tenderness noted. Psychiatric: Patient alert and oriented x3, good insight and cognition, good recent to remote recall. Lymph node survey: No cervical axillary or inguinal lymphadenopathy noted.  Lab Results:  Basic Metabolic Panel:    Component Value Date/Time   NA 137 09/13/2022 0708   NA 137 08/14/2022 1151   K 5.1 09/13/2022 0708   CL 114 (H) 09/13/2022 0708   CO2 16 (L) 09/13/2022 0708   BUN 49 (H) 09/13/2022 0708   BUN 22 08/14/2022 1151   CREATININE 2.65 (H) 09/13/2022 0708   CREATININE 0.90 07/19/2014 1128   GLUCOSE 87 09/13/2022 0708   CALCIUM 8.9 09/13/2022 0708   CBC:    Component Value Date/Time   WBC 9.8 09/13/2022 0708   HGB 8.3 (L) 09/13/2022 0708   HGB 6.8 (LL) 08/07/2022 1149   HCT 24.8 (L) 09/13/2022 0708   HCT 21.0 (L) 08/07/2022 1149   PLT 116 (L) 09/13/2022 0708   PLT 217 08/07/2022 1149   MCV 81.6 09/13/2022 0708   MCV 91 08/07/2022 1149   NEUTROABS 5.8 09/13/2022 0708   NEUTROABS 4.9 08/07/2022 1149   LYMPHSABS 2.0 09/13/2022 0708   LYMPHSABS 3.3 (H) 08/07/2022 1149   MONOABS 1.6 (H) 09/13/2022 0708   EOSABS 0.3 09/13/2022 0708   EOSABS 0.2 08/07/2022 1149   BASOSABS 0.0 09/13/2022  0708   BASOSABS 0.0 08/07/2022 1149    Recent Results (from the past 240 hour(s))  Urine Culture     Status: Abnormal   Collection Time: 09/11/22 11:32 AM   Specimen: Urine, Clean Catch  Result Value Ref Range Status   Specimen Description   Final    URINE, CLEAN CATCH Performed at [facility], [place] [address] [place], [place] [address]    Special Requests   Final    NONE Performed at [facility], [place] [address], [place], [place] [address]    Culture (A)  Final    <10,000 COLONIES/mL INSIGNIFICANT GROWTH Performed at [place] [address], [place], [place]  [address]    Report Status 09/12/2022 FINAL  Final  Culture, blood (Routine X 2) w Reflex to ID Panel     Status: None (Preliminary result)   Collection Time: 09/11/22 11:37 AM   Specimen: BLOOD LEFT ARM  Result Value Ref Range Status   Specimen Description   Final    BLOOD LEFT ARM Performed at [place] [address], [place], [place] [address]    Special Requests   Final    BOTTLES DRAWN AEROBIC AND ANAEROBIC Blood Culture adequate volume Performed at [facility] [address] [place], [place] [address]    Culture   Final    NO GROWTH < 24 HOURS Performed at [place] [address], [place], [place] [address]    Report Status PENDING  Incomplete  Culture, blood (Routine X 2) w Reflex to ID Panel     Status: None (Preliminary result)   Collection Time: 09/11/22 11:54 AM   Specimen: BLOOD RIGHT ARM  Result Value Ref Range Status   Specimen Description   Final    BLOOD RIGHT ARM Performed at [place] [address], [place], [place] [address]    Special Requests   Final    BOTTLES DRAWN AEROBIC AND ANAEROBIC Blood Culture adequate volume Performed at [place] [address], [place], [place] [address]    Culture   Final    NO GROWTH < 24 HOURS Performed at [facility] [address] [place], [place] [address]    Report Status PENDING  Incomplete  SARS Coronavirus 2 by RT PCR (hospital order, performed in [facility] hospital lab) *cepheid single result test* Anterior Nasal Swab     Status: None   Collection Time: 09/11/22 12:39 PM   Specimen: Anterior Nasal Swab  Result Value Ref Range Status   SARS Coronavirus 2 by RT PCR NEGATIVE NEGATIVE Final    Comment: (NOTE) SARS-CoV-2 target nucleic acids are NOT DETECTED.  The SARS-CoV-2 RNA is generally detectable in upper and lower respiratory specimens during the acute phase of infection. The lowest concentration of  SARS-CoV-2 viral copies this assay can detect is 250 copies / mL. A negative result does not preclude SARS-CoV-2 infection and should not be used as the sole basis for treatment or other patient management decisions.  A negative result may occur with improper specimen collection / handling, submission of specimen other than nasopharyngeal swab, presence of viral mutation(s) within the areas targeted by this assay, and inadequate number of viral copies (<250 copies / mL). A negative result must be combined with clinical observations, patient history, and epidemiological information.  Fact Sheet for Patients:   https://www.patel.info/  Fact Sheet for Healthcare Providers: https://hall.com/  This  test is not yet approved or  cleared by the [place] and has been authorized for detection and/or diagnosis of SARS-CoV-2 by FDA under an Emergency Use Authorization (EUA).  This EUA will remain in effect (meaning this test can be used) for the duration of the COVID-19 declaration under Section 564(b)(1) of the Act, 21 U.S.C. section 360bbb-3(b)(1), unless the authorization is terminated or revoked sooner.  Performed at [facility], [place] [address], [place], [place] [address]     Studies/Results: DG Chest 2 View  Result Date: 09/11/2022 CLINICAL DATA:  Sickle cell pain crisis. EXAM: CHEST - 2 VIEW COMPARISON:  CT chest and chest x-ray dated June 18, 2021. FINDINGS: Cardiomegaly. Mild diffuse interstitial thickening. Right-greater-than-left lower lobe consolidation. No pleural effusion or pneumothorax. No acute osseous abnormality. IMPRESSION: 1. Right-greater-than-left lower lobe consolidation, concerning for acute chest syndrome and/or pneumonia. 2. Mild interstitial pulmonary edema. Electronically Signed   By: Titus Dubin M.D.   On: 09/11/2022 16:11    Medications: Scheduled Meds:  Chlorhexidine Gluconate Cloth  6  each Topical Daily   feeding supplement  237 mL Oral BID BM   folic acid  1 mg Oral Daily   senna-docusate  1 tablet Oral BID   Continuous Infusions:  sodium chloride 150 mL/hr at 09/13/22 0904   ceFEPime (MAXIPIME) IV 2 g (09/13/22 1209)   PRN Meds:.acetaminophen, mouth rinse, oxyCODONE, polyethylene glycol  Consultants: None. Discontinued nephrology consult  Procedures: None  Antibiotics: IV cefepime  Assessment/Plan: Principal Problem:   Sickle cell pain crisis ([place]) Active Problems:   Chronic pain   Thrombocytopenia (HCC)   Leukocytosis   AKI (acute kidney injury) ([place])   Malnutrition of moderate degree   Hemolytic crisis  Sepsis: Patient with fever, chest x-ray showing possible pneumonia versus acute chest syndrome, hypoxia and hypoxemia, leukocytosis, acute kidney injury from overwhelming sepsis and dehydration.  Patient is on IV cefepime.  We will continue antibiotics, continue supplemental oxygen. Incentive spirometry q. hourly while awake. Blood cultures negative. Acute kidney injury: Renal function continues to improve, status post exchange transfusion. Continue hydration but will reduce rate from 1 50-75.  Repeat labs in AM.  Avoid all nephrotoxins. Transaminitis: Most likely due to sepsis and vaso-occlusive crisis.  Fortunately, transaminases continues to trend down. We will continue to monitor closely.  Avoid all hepatotoxins.  Repeat labs in AM. Hb Sickle Cell Disease with Pain crisis: Reduce IVF 0.45% Saline to 75 mls/hour, continue oxycodone 10 mg every 4 hours as needed for pain.  IV Toradol is contraindicated due to acute kidney failure. Monitor vitals very closely, Re-evaluate pain scale regularly, 2 L of Oxygen by [place].  Transfer patient out of ICU to [place] floor. Leukocytosis: Resolved.  Patient on antibiotics. Symptomatic anemia: Patient is status post blood transfusion and he had exchange transfusion, hemoglobin is improved is stable at above 8 this morning.   We will continue to monitor closely. Hyperkalemia: Potassium level now better than before, 5.1 this morning.  We will continue to monitor closely, consider Lokelma if potassium level is elevated again in the morning. Generalized weakness: Vitals signs are stable. Lab results are reassuring, kidney function is significantly improving.  We will order PT OT for evaluation. Homelessness: Patient is experiencing housing insecurity, currently living in a hotel with a girlfriend.  Clinical social worker consult placed.  Code Status: Full Code Family Communication: N/A Disposition Plan: Not yet ready for discharge  Mrytle Bento  If 7PM-7AM, please contact night-coverage.  09/13/2022, 1:46  PM  LOS: 3 days

## 2022-09-14 LAB — CBC WITH DIFFERENTIAL/PLATELET
Abs Immature Granulocytes: 0.12 10*3/uL — ABNORMAL HIGH (ref 0.00–0.07)
Basophils Absolute: 0 10*3/uL (ref 0.0–0.1)
Basophils Relative: 0 %
Eosinophils Absolute: 0.3 10*3/uL (ref 0.0–0.5)
Eosinophils Relative: 3 %
HCT: 25.5 % — ABNORMAL LOW (ref 39.0–52.0)
Hemoglobin: 8.8 g/dL — ABNORMAL LOW (ref 13.0–17.0)
Immature Granulocytes: 1 %
Lymphocytes Relative: 21 %
Lymphs Abs: 2.2 10*3/uL (ref 0.7–4.0)
MCH: 27.5 pg (ref 26.0–34.0)
MCHC: 34.5 g/dL (ref 30.0–36.0)
MCV: 79.7 fL — ABNORMAL LOW (ref 80.0–100.0)
Monocytes Absolute: 1.7 10*3/uL — ABNORMAL HIGH (ref 0.1–1.0)
Monocytes Relative: 16 %
Neutro Abs: 6.2 10*3/uL (ref 1.7–7.7)
Neutrophils Relative %: 59 %
Platelets: 143 10*3/uL — ABNORMAL LOW (ref 150–400)
RBC: 3.2 MIL/uL — ABNORMAL LOW (ref 4.22–5.81)
RDW: 25.5 % — ABNORMAL HIGH (ref 11.5–15.5)
WBC: 10.5 10*3/uL (ref 4.0–10.5)
nRBC: 24.7 % — ABNORMAL HIGH (ref 0.0–0.2)

## 2022-09-14 LAB — COMPREHENSIVE METABOLIC PANEL
ALT: 68 U/L — ABNORMAL HIGH (ref 0–44)
AST: 78 U/L — ABNORMAL HIGH (ref 15–41)
Albumin: 3.5 g/dL (ref 3.5–5.0)
Alkaline Phosphatase: 184 U/L — ABNORMAL HIGH (ref 38–126)
Anion gap: 7 (ref 5–15)
BUN: 35 mg/dL — ABNORMAL HIGH (ref 6–20)
CO2: 17 mmol/L — ABNORMAL LOW (ref 22–32)
Calcium: 9.5 mg/dL (ref 8.9–10.3)
Chloride: 116 mmol/L — ABNORMAL HIGH (ref 98–111)
Creatinine, Ser: 1.96 mg/dL — ABNORMAL HIGH (ref 0.61–1.24)
GFR, Estimated: 40 mL/min — ABNORMAL LOW (ref >60)
Glucose, Bld: 91 mg/dL (ref 70–99)
Potassium: 4.9 mmol/L (ref 3.5–5.1)
Sodium: 140 mmol/L (ref 135–145)
Total Bilirubin: 3.6 mg/dL — ABNORMAL HIGH (ref 0.3–1.2)
Total Protein: 7.1 g/dL (ref 6.5–8.1)

## 2022-09-14 MED ORDER — SODIUM CHLORIDE 0.9 % IV SOLN
8.0000 mg | Freq: Four times a day (QID) | INTRAVENOUS | Status: DC | PRN
  Filled 2022-09-14: qty 4

## 2022-09-14 MED ORDER — SODIUM CHLORIDE 0.9 % IV SOLN
2.0000 g | Freq: Two times a day (BID) | INTRAVENOUS | Status: DC
  Administered 2022-09-14 – 2022-09-15 (×3): 2 g via INTRAVENOUS
  Filled 2022-09-14 (×3): qty 12.5

## 2022-09-14 MED ORDER — MENTHOL 3 MG MT LOZG
1.0000 | LOZENGE | OROMUCOSAL | Status: DC | PRN
  Filled 2022-09-14: qty 9

## 2022-09-14 MED ORDER — LACTULOSE 10 GM/15ML PO SOLN
30.0000 g | Freq: Two times a day (BID) | ORAL | Status: DC | PRN
  Administered 2022-09-14 (×2): 30 g via ORAL
  Filled 2022-09-14 (×2): qty 45

## 2022-09-14 NOTE — Progress Notes (Signed)
Patient ID: Scott Norton, male   DOB: [birth year], 56 y.o.   MRN: [id number] Subjective: Scott Norton is a 56 year old male with a medical history significant for sickle cell disease, chronic pain syndrome, history of anemia of chronic disease, and history of cocaine abuse was admitted for symptomatic anemia in the setting of sickle cell pain crisis.    Patient is complaining of hoarse voice this morning and left-sided lower abdominal pain.  Patient has not had bowel movement in 3 days.  He has no abdominal distention or vomiting.  No fever.  Patient ambulated with no significant pain and no fall risk.  His oxygen saturation is maintained at above 92%.  Objective:  Vital signs in last 24 hours:  Vitals:   09/13/22 1022 09/13/22 1357 09/13/22 2018 09/14/22 0435  BP: 109/68 (!) 108/55 (!) 106/50 (!) 102/54  Pulse: (!) 50 (!) 59 64 (!) 56  Resp: 18 20 18 18   Temp: 97.8 F (36.6 C) 97.8 F (36.6 C) 98.3 F (36.8 C) 97.9 F (36.6 C)  TempSrc: Oral Oral Oral Oral  SpO2: 94% 94% 92% 93%  Weight:      Height:        Intake/Output from previous day:  No intake or output data in the 24 hours ending 09/14/22 1334   Physical Exam: General: Alert, awake, oriented x3, in no acute distress but chronically ill looking.  Disheveled. HEENT: [place]/AT PEERL, EOMI Neck: Trachea midline,  no masses, no thyromegal,y no JVD, no carotid bruit OROPHARYNX:  Moist, No exudate/ erythema/lesions.  Heart: Regular rate and rhythm, with systolic murmurs, no rubs, gallops, PMI non-displaced, no heaves or thrills on palpation.  Lungs: Reduced air entry plus rhonchi but no rales noted. No increased vocal fremitus resonant to percussion  Abdomen: Soft, nontender, nondistended, positive bowel sounds, no masses no hepatosplenomegaly noted..  Neuro: No focal neurological deficits noted cranial nerves II through XII grossly intact. DTRs 2+ bilaterally upper and lower extremities. Strength 5 out of 5 in bilateral upper and  lower extremities. Musculoskeletal: No warm swelling or erythema around joints, no spinal tenderness noted. Psychiatric: Patient alert and oriented x3, good insight and cognition, good recent to remote recall. Lymph node survey: No cervical axillary or inguinal lymphadenopathy noted.  Lab Results:  Basic Metabolic Panel:    Component Value Date/Time   NA 140 09/14/2022 0547   NA 137 08/14/2022 1151   K 4.9 09/14/2022 0547   CL 116 (H) 09/14/2022 0547   CO2 17 (L) 09/14/2022 0547   BUN 35 (H) 09/14/2022 0547   BUN 22 08/14/2022 1151   CREATININE 1.96 (H) 09/14/2022 0547   CREATININE 0.90 07/19/2014 1128   GLUCOSE 91 09/14/2022 0547   CALCIUM 9.5 09/14/2022 0547   CBC:    Component Value Date/Time   WBC 10.5 09/14/2022 0547   HGB 8.8 (L) 09/14/2022 0547   HGB 6.8 (LL) 08/07/2022 1149   HCT 25.5 (L) 09/14/2022 0547   HCT 21.0 (L) 08/07/2022 1149   PLT 143 (L) 09/14/2022 0547   PLT 217 08/07/2022 1149   MCV 79.7 (L) 09/14/2022 0547   MCV 91 08/07/2022 1149   NEUTROABS 6.2 09/14/2022 0547   NEUTROABS 4.9 08/07/2022 1149   LYMPHSABS 2.2 09/14/2022 0547   LYMPHSABS 3.3 (H) 08/07/2022 1149   MONOABS 1.7 (H) 09/14/2022 0547   EOSABS 0.3 09/14/2022 0547   EOSABS 0.2 08/07/2022 1149   BASOSABS 0.0 09/14/2022 0547   BASOSABS 0.0 08/07/2022 1149    Recent Results (  from the past 240 hour(s))  Urine Culture     Status: Abnormal   Collection Time: 09/11/22 11:32 AM   Specimen: Urine, Clean Catch  Result Value Ref Range Status   Specimen Description   Final    URINE, CLEAN CATCH Performed at [facility], [address], [place], [place] [address]    Special Requests   Final    NONE Performed at [facility], [address], [place], [place] [address]    Culture (A)  Final    <10,000 COLONIES/mL INSIGNIFICANT GROWTH Performed at [facility], [address], [place], [place] [address]    Report Status 09/12/2022 FINAL  Final   Culture, blood (Routine X 2) w Reflex to ID Panel     Status: None (Preliminary result)   Collection Time: 09/11/22 11:37 AM   Specimen: BLOOD LEFT ARM  Result Value Ref Range Status   Specimen Description   Final    BLOOD LEFT ARM Performed at [facility], [address] [place], [place] [address]    Special Requests   Final    BOTTLES DRAWN AEROBIC AND ANAEROBIC Blood Culture adequate volume Performed at [facility], [address] [place], [place] [address]    Culture   Final    NO GROWTH 3 DAYS Performed at [facility], [address], [place], [place] [address]    Report Status PENDING  Incomplete  Culture, blood (Routine X 2) w Reflex to ID Panel     Status: None (Preliminary result)   Collection Time: 09/11/22 11:54 AM   Specimen: BLOOD RIGHT ARM  Result Value Ref Range Status   Specimen Description   Final    BLOOD RIGHT ARM Performed at [facility], [address] [place], [place] [address]    Special Requests   Final    BOTTLES DRAWN AEROBIC AND ANAEROBIC Blood Culture adequate volume Performed at [facility], [address] [place], [place] [address]    Culture   Final    NO GROWTH 3 DAYS Performed at [facility], [address] [place], [place] [address]    Report Status PENDING  Incomplete  SARS Coronavirus 2 by RT PCR (hospital order, performed in [facility] hospital lab) *cepheid single result test* Anterior Nasal Swab     Status: None   Collection Time: 09/11/22 12:39 PM   Specimen: Anterior Nasal Swab  Result Value Ref Range Status   SARS Coronavirus 2 by RT PCR NEGATIVE NEGATIVE Final    Comment: (NOTE) SARS-CoV-2 target nucleic acids are NOT DETECTED.  The SARS-CoV-2 RNA is generally detectable in upper and lower respiratory specimens during the acute phase of infection. The lowest concentration of SARS-CoV-2 viral copies this assay can detect is  250 copies / mL. A negative result does not preclude SARS-CoV-2 infection and should not be used as the sole basis for treatment or other patient management decisions.  A negative result may occur with improper specimen collection / handling, submission of specimen other than nasopharyngeal swab, presence of viral mutation(s) within the areas targeted by this assay, and inadequate number of viral copies (<250 copies / mL). A negative result must be combined with clinical observations, patient history, and epidemiological information.  Fact Sheet for Patients:   RoadLapTop.co.za  Fact Sheet for Healthcare Providers: http://kim-miller.com/  This test is not yet approved or  cleared by the [place] and  has been authorized for detection and/or diagnosis of SARS-CoV-2 by FDA under an Emergency Use Authorization (EUA).  This EUA will remain in effect (meaning this test can be used) for the duration of the COVID-19 declaration under Section 564(b)(1) of the Act, 21 U.S.C. section 360bbb-3(b)(1), unless the authorization is terminated or revoked sooner.  Performed at [facility], [address], [place], [place] [address]     Studies/Results: No results found.  Medications: Scheduled Meds:  Chlorhexidine Gluconate Cloth  6 each Topical Daily   feeding supplement  237 mL Oral BID BM   folic acid  1 mg Oral Daily   senna-docusate  1 tablet Oral BID   Continuous Infusions:  sodium chloride 75 mL/hr at 09/13/22 1402   ceFEPime (MAXIPIME) IV 2 g (09/14/22 1015)   PRN Meds:.acetaminophen, lactulose, menthol-cetylpyridinium, mouth rinse, oxyCODONE, phenol, polyethylene glycol  Consultants: None. Discontinued nephrology consult  Procedures: None  Antibiotics: IV cefepime day 4.  Assessment/Plan: Principal Problem:   Sickle cell pain crisis (HCC) Active Problems:   Chronic pain   Thrombocytopenia  (HCC)   Leukocytosis   AKI (acute kidney injury) (HCC)   Malnutrition of moderate degree   Hemolytic crisis   Weakness generalized  Sepsis: Improving.  No more fever. Patient is on IV cefepime.  We will continue antibiotics, continue supplemental oxygen. Incentive spirometry q. hourly while awake. Blood cultures negative so far. Acute kidney injury: Renal function continues to improve, status post exchange transfusion.  Continue hydration at 75 cc an hour for another 12 hours.  Repeat labs in AM.  Avoid all nephrotoxins. Transaminitis: Most likely due to sepsis and vaso-occlusive crisis.  Fortunately, transaminases continues to trend down. We will continue to monitor closely.  Avoid all hepatotoxins.  Repeat labs in AM. Hb Sickle Cell Disease with Pain crisis: Pain is much improved.  Continue IVF 0.45% Saline at 75 mls/hour, continue oxycodone 10 mg every 4 hours as needed for pain.  IV Toradol is contraindicated due to acute kidney failure. Monitor vitals very closely, Re-evaluate pain scale regularly, 2 L of Oxygen by .   Leukocytosis: Resolved. Patient on antibiotics. Constipation: Lactulose now and as needed until bowel movement. Symptomatic anemia: Patient is status post blood transfusion and he had exchange transfusion, hemoglobin is improved is stable at above 8 this morning.  We will continue to monitor closely. Hyperkalemia: Resolved. Potassium level now returned to normal at 4.9. We will continue to monitor closely, consider Lokelma if potassium level is elevated again in the morning. Generalized weakness: Improved. Vitals signs are stable. Lab results are reassuring, kidney function is significantly improving.  Homelessness: Patient is experiencing housing insecurity, currently living in a hotel with a girlfriend.  Clinical social worker consult placed.  Code Status: Full Code Family Communication: N/A Disposition Plan: Not yet ready for discharge  Scott Norton  If 7PM-7AM,  please contact night-coverage.  09/14/2022, 1:34 PM  LOS: 4 days

## 2022-09-14 NOTE — Progress Notes (Signed)
Pharmacy Antibiotic Note  Scott Norton is a 56 y.o. male admitted on 09/10/2022 with sepsis, possible pneumonia versus acute chest syndrome.  Pharmacy has been consulted for cefepime dosing.  Day 4 Cefepime WBC trending down SCr improving Afebrile Cultures no growth to date  Plan: Change cefepime to 2g IV q12h for crcl 30-60 ml/min Monitor clinical progress, renal function F/U C&S, abx deescalation / LOT   Height: 6\' 3"  (190.5 cm) Weight: 76.2 kg (168 lb) IBW/kg (Calculated) : 84.5  Temp (24hrs), Avg:98 F (36.7 C), Min:97.8 F (36.6 C), Max:98.3 F (36.8 C)  Recent Labs  Lab 09/10/22 1022 09/10/22 2153 09/11/22 0607 09/11/22 1137 09/11/22 1358 09/12/22 0720 09/12/22 1528 09/13/22 0708 09/14/22 0547  WBC 13.6*  --  11.0*  --   --  11.9*  --  9.8 10.5  CREATININE 3.58*   < > 5.43*  --  5.60* 4.06* 3.41* 2.65* 1.96*  LATICACIDVEN  --   --   --  1.0 1.2  --   --   --   --    < > = values in this interval not displayed.     Estimated Creatinine Clearance: 45.9 mL/min (A) (by C-G formula based on SCr of 1.96 mg/dL (H)).    Allergies  Allergen Reactions   Aspirin Other (See Comments)    Increased heart rate   Tape Other (See Comments)    rash   Tramadol Hives and Itching    Thank you for allowing pharmacy to be a part of this patient's care.  09/16/22, PharmD, BCPS 09/14/2022 9:26 AM

## 2022-09-14 NOTE — Evaluation (Signed)
Physical Therapy Evaluation Patient Details Name: Scott Norton MRN: [id number] DOB: Mar 13, 1966 Today's Date: 09/14/2022  History of Present Illness  56 year old male with a medical history significant for sickle cell disease, chronic pain syndrome, history of anemia of chronic disease, and history of cocaine abuse was admitted for symptomatic anemia in the setting of sickle cell pain crisis.  Clinical Impression  Pt ambulated 280' without an assistive device, no loss of balance. He denies falls in the past 6 months. He is independent with mobility, no further PT indicated. Will sign off.         Recommendations for follow up therapy are one component of a multi-disciplinary discharge planning process, led by the attending physician.  Recommendations may be updated based on patient status, additional functional criteria and insurance authorization.  Follow Up Recommendations No PT follow up      Assistance Recommended at Discharge None  Patient can return home with the following       Equipment Recommendations None recommended by PT  Recommendations for Other Services       Functional Status Assessment Patient has not had a recent decline in their functional status     Precautions / Restrictions Precautions Precautions: None Restrictions Weight Bearing Restrictions: No      Mobility  Bed Mobility Overal bed mobility: Independent                  Transfers Overall transfer level: Independent Equipment used: None                    Ambulation/Gait Ambulation/Gait assistance: Independent Gait Distance (Feet): 280 Feet Assistive device: None Gait Pattern/deviations: WFL(Within Functional Limits)       General Gait Details: steady, no loss of balance  Stairs            Wheelchair Mobility    Modified Rankin (Stroke Patients Only)       Balance Overall balance assessment: Independent                                            Pertinent Vitals/Pain Pain Assessment Pain Assessment: 0-10 Pain Score: 10-Worst pain ever Pain Location: L chest Pain Descriptors / Indicators: Sharp Pain Intervention(s): Limited activity within patient's tolerance, Monitored during session, Premedicated before session    Home Living Family/patient expects to be discharged to:: Other (Comment) (hotel) Living Arrangements: Spouse/significant other                 Additional Comments: stays in a motel    Prior Function Prior Level of Function : Independent/Modified Independent             Mobility Comments: walks without AD, denies falls in past year ADLs Comments: independent     Hand Dominance        Extremity/Trunk Assessment   Upper Extremity Assessment Upper Extremity Assessment: Overall WFL for tasks assessed    Lower Extremity Assessment Lower Extremity Assessment: Overall WFL for tasks assessed    Cervical / Trunk Assessment Cervical / Trunk Assessment: Normal  Communication   Communication: No difficulties  Cognition Arousal/Alertness: Awake/alert Behavior During Therapy: WFL for tasks assessed/performed Overall Cognitive Status: Within Functional Limits for tasks assessed  General Comments      Exercises     Assessment/Plan    PT Assessment Patient does not need any further PT services  PT Problem List         PT Treatment Interventions      PT Goals (Current goals can be found in the Care Plan section)  Acute Rehab PT Goals PT Goal Formulation: All assessment and education complete, DC therapy    Frequency       Co-evaluation               AM-PAC PT "6 Clicks" Mobility  Outcome Measure Help needed turning from your back to your side while in a flat bed without using bedrails?: None Help needed moving from lying on your back to sitting on the side of a flat bed without using bedrails?: None Help  needed moving to and from a bed to a chair (including a wheelchair)?: None Help needed standing up from a chair using your arms (e.g., wheelchair or bedside chair)?: None Help needed to walk in hospital room?: None Help needed climbing 3-5 steps with a railing? : None 6 Click Score: 24    End of Session   Activity Tolerance: Patient tolerated treatment well Patient left: in bed;with call bell/phone within reach Nurse Communication: Mobility status      Time: 2035-5974 PT Time Calculation (min) (ACUTE ONLY): 25 min   Charges:   PT Evaluation $PT Eval Low Complexity: 1 Low PT Treatments $Gait Training: 8-22 mins        Ralene Bathe Kistler PT 09/14/2022  Acute Rehabilitation Services  Office [phone]

## 2022-09-14 NOTE — Progress Notes (Signed)
   09/14/22 1350  Provider Notification  Provider Name/Title Dr. Hyman Hopes  Date Provider Notified 09/14/22  Time Provider Notified 1350  Method of Notification Page  Notification Reason Other (Comment) (patient has small emesis and asking for something for nausea. When zofran arrived from pharmacy the patient denied still needing it.)  Provider response See new orders  Date of Provider Response 09/14/22  Time of Provider Response 1350

## 2022-09-15 ENCOUNTER — Other Ambulatory Visit (HOSPITAL_COMMUNITY): Payer: Self-pay

## 2022-09-15 DIAGNOSIS — D649 Anemia, unspecified: Secondary | ICD-10-CM | POA: Diagnosis not present

## 2022-09-15 DIAGNOSIS — N179 Acute kidney failure, unspecified: Secondary | ICD-10-CM | POA: Diagnosis not present

## 2022-09-15 DIAGNOSIS — D571 Sickle-cell disease without crisis: Secondary | ICD-10-CM

## 2022-09-15 DIAGNOSIS — G894 Chronic pain syndrome: Secondary | ICD-10-CM | POA: Diagnosis not present

## 2022-09-15 DIAGNOSIS — D5700 Hb-SS disease with crisis, unspecified: Secondary | ICD-10-CM | POA: Diagnosis not present

## 2022-09-15 MED ORDER — MENTHOL 3 MG MT LOZG
1.0000 | LOZENGE | OROMUCOSAL | 12 refills | Status: DC | PRN
  Filled 2022-09-15: qty 100, fill #0

## 2022-09-15 MED ORDER — GUAIFENESIN-DM 100-10 MG/5ML PO SYRP
10.0000 mL | ORAL_SOLUTION | ORAL | 0 refills | Status: DC | PRN
  Filled 2022-09-15 – 2022-09-29 (×2): qty 480, 8d supply, fill #0

## 2022-09-15 MED ORDER — GUAIFENESIN-DM 100-10 MG/5ML PO SYRP
10.0000 mL | ORAL_SOLUTION | ORAL | Status: DC | PRN
  Administered 2022-09-15: 10 mL via ORAL
  Filled 2022-09-15: qty 10

## 2022-09-15 MED ORDER — AMOXICILLIN-POT CLAVULANATE 875-125 MG PO TABS
1.0000 | ORAL_TABLET | Freq: Two times a day (BID) | ORAL | 0 refills | Status: AC
  Filled 2022-09-15: qty 14, 7d supply, fill #0

## 2022-09-15 MED ORDER — OXYCODONE-ACETAMINOPHEN 10-325 MG PO TABS
1.0000 | ORAL_TABLET | Freq: Four times a day (QID) | ORAL | 0 refills | Status: DC | PRN
  Filled 2022-09-15: qty 60, 15d supply, fill #0

## 2022-09-15 NOTE — Discharge Summary (Signed)
Physician Discharge Summary  Scott Norton ZOX:[id number] DOB: [birth year] DOA: 09/10/2022  PCP: Scott Norton, Scott M, FNP  Admit date: 09/10/2022  Discharge date: 09/15/2022  Discharge Diagnoses:  Principal Problem:   Sickle cell pain crisis (HCC) Active Problems:   Chronic pain   Thrombocytopenia (HCC)   Leukocytosis   AKI (acute kidney injury) (HCC)   Malnutrition of moderate degree   Hemolytic crisis   Weakness generalized   Discharge Condition: Stable  Disposition:   Follow-up Information     Scott Norton, Scott M, FNP. Schedule an appointment as soon as possible for a visit in 2 day(s).   Specialty: Family Medicine Contact information: [address] [place] [address] [phone]                Pt is discharged home in good condition and is to follow up with Scott Norton, Scott M, FNP this week to have labs evaluated. Scott Norton is instructed to increase activity slowly and balance with rest for the next few days, and use prescribed medication to complete treatment of pain  Diet: Regular Wt Readings from Last 3 Encounters:  09/10/22 76.2 kg  08/07/22 78.3 kg  05/03/22 78.1 kg    History of present illness:  Scott Norton  is a 56 y.o. male with a medical history significant for sickle cell disease, chronic pain syndrome, opiate dependence and tolerance, anemia of chronic disease, homelessness and history of cocaine abuse presented to the emergency department with complaints of ongoing fatigue and allover body pain over the past 3 days.  He states that pain is primarily to his joints.  Patient attributes increased pain to changes in weather and not having his opiate medications.  Patient has been without medications over the past several weeks.  Patient endorses fatigue and shortness of breath with exertion.  He rates his pain as 8/10, constant, and sharp.  He denies any fever, chills, chest pain, or dizziness.  No blurred vision or paresthesias.  No urinary  symptoms, nausea, vomiting, or diarrhea. No sick contacts, recent travel, or known exposure to COVID-19.   ER course: While in ER, patient's vital signs showed: BP 101/63   Pulse (!) 57   Temp 97.6 F (36.4 C) (Oral)   Resp 19   Ht 6\' 3"  (1.905 m)   Wt 76.2 kg   SpO2 95%   BMI 21.00 kg/m  Blood pressure typically runs low.  CBC notable for WBCs 13.6, hemoglobin 5.5 g/dL, and platelets [id number],000.  Complete metabolic panel shows creatinine markedly elevated at 3.58, BUN 68, AST 350, ALT 193, ALP 153, and total bilirubin 2.4.  Urine drug screen and urinalysis pending.  Reticulocytes pending.  Hospital Course:  Patient was admitted for symptomatic anemia in the setting of sickle cell pain crisis and managed appropriately with IVF, IV Dilaudid via PCA as well as other adjunct therapies per sickle cell pain management protocols.  Patient subsequently developed fever and hypotension, and hypoxia with SPO2 less than 90%, only improved to 95% on 3 L of oxygen by nasal cannula, with elevated transaminases, LDH of 1252 and acute kidney injury with start of breath me at 5.43, patient was transferred to ICU stepdown for close monitoring and management.  IV Toradol was held because of AKI.  Patient received exchange blood transfusion, IV antibiotics and continued on IV fluid.  His general condition began to improve, transaminases trended down as well as significant improvement in kidney function.  On the third day patient was transferred out  of ICU to MedSurg bed while he continued to improve and the wound began to ambulate with no significant pain.  Patient is experiencing homelessness, currently lives in a hotel with his girlfriend, Education officer, museum was consulted and they evaluated patient's and provided some support.  Patient's general condition slowly improved, fever broke, oxygenation improved even at room air, kidney function showed significant improvement as well as the liver functions.  Patient started  tolerating p.o. intake with no restrictions.  PT OT evaluated patient and was considered low complexity as he was able to complete his ADLs and IADLs.  Leukocytosis resolved.  Hyperkalemia resolved.  Generalized weakness improved significantly.  Blood cultures negative.  As at today, patient has significantly improved and has returned almost to his baseline. Patient was therefore discharged home today in a hemodynamically stable condition.  Patient was discharged on 5 more days of oral antibiotics, cough expectorant -Robitussin and oral pain medication -15 days worth of Percocet.  [name] will follow-up with PCP within 1 week of this discharge. [name] was counseled extensively about nonpharmacologic means of pain management, patient verbalized understanding and was appreciative of  the care received during this admission.   We discussed the need for good hydration, monitoring of hydration status, avoidance of heat, cold, stress, and infection triggers. We discussed the need to be adherent with taking Hydrea and other home medications. Patient was reminded of the need to seek medical attention immediately if any symptom of bleeding, anemia, or infection occurs.  Discharge Exam: Vitals:   09/15/22 0425 09/15/22 0909  BP: (!) 111/55 108/64  Pulse: (!) 48 (!) 53  Resp: 18   Temp:  98 F (36.7 C)  SpO2: 100% 97%   Vitals:   09/14/22 1419 09/14/22 2010 09/15/22 0425 09/15/22 0909  BP: 118/62 (!) 103/49 (!) 111/55 108/64  Pulse: 91 (!) 52 (!) 48 (!) 53  Resp: 18 18 18    Temp: 97.6 F (36.4 C) 97.9 F (36.6 C)  98 F (36.7 C)  TempSrc: Oral Oral  Oral  SpO2: (!) 73% 94% 100% 97%  Weight:      Height:        General appearance : Awake, alert, not in any distress. Speech Clear. Not toxic looking HEENT: Atraumatic and Normocephalic, pupils equally reactive to light and accomodation Neck: Supple, no JVD. No cervical lymphadenopathy.  Chest: Good air entry bilaterally, no added sounds  CVS: S1 S2  regular, no murmurs.  Abdomen: Bowel sounds present, Non tender and not distended with no gaurding, rigidity or rebound. Extremities: B/L Lower Ext shows no edema, both legs are warm to touch Neurology: Awake alert, and oriented X 3, CN II-XII intact, Non focal Skin: No Rash  Discharge Instructions  Discharge Instructions     Diet - low sodium heart healthy   Complete by: As directed    Increase activity slowly   Complete by: As directed       Allergies as of 09/15/2022       Reactions   Aspirin Other (See Comments)   Increased heart rate   Tape Other (See Comments)   rash   Tramadol Hives, Itching        Medication List     TAKE these medications    amoxicillin-clavulanate 875-125 MG tablet Commonly known as: AUGMENTIN Take 1 tablet by mouth 2 (two) times daily for 7 days.   guaiFENesin-dextromethorphan 100-10 MG/5ML syrup Commonly known as: ROBITUSSIN DM Take 10 mLs by mouth every 4 (four) hours as needed  for cough.   ibuprofen 800 MG tablet Commonly known as: ADVIL Take 1 tablet by mouth every 8 hours as needed. What changed: reasons to take this   menthol-cetylpyridinium 3 MG lozenge Commonly known as: CEPACOL Take 1 lozenge (3 mg total) by mouth as needed for sore throat.   oxyCODONE-acetaminophen 10-325 MG tablet Commonly known as: Percocet Take 1 tablet by mouth every 6 hours as needed for pain.        The results of significant diagnostics from this hospitalization (including imaging, microbiology, ancillary and laboratory) are listed below for reference.    Significant Diagnostic Studies: DG Chest 2 View  Result Date: 09/11/2022 CLINICAL DATA:  Sickle cell pain crisis. EXAM: CHEST - 2 VIEW COMPARISON:  CT chest and chest x-ray dated June 18, 2021. FINDINGS: Cardiomegaly. Mild diffuse interstitial thickening. Right-greater-than-left lower lobe consolidation. No pleural effusion or pneumothorax. No acute osseous abnormality. IMPRESSION: 1.  Right-greater-than-left lower lobe consolidation, concerning for acute chest syndrome and/or pneumonia. 2. Mild interstitial pulmonary edema. Electronically Signed   By: Obie Dredge M.D.   On: 09/11/2022 16:11   US RENAL  Result Date: 09/11/2022 CLINICAL DATA:  Acute kidney injury. EXAM: RENAL / URINARY TRACT ULTRASOUND COMPLETE COMPARISON:  None Available. FINDINGS: Right Kidney: Renal measurements: 10.3 x 5.9 x 6.0 cm = volume: 192 mL. Diffusely increased parenchymal echogenicity evident. No hydronephrosis. Left Kidney: Renal measurements: 10.6 x 6.1 x 6.3 cm = volume: To 114 mL. Diffusely increased parenchymal echogenicity without hydronephrosis. Bladder: Appears normal for degree of bladder distention. Other: None. IMPRESSION: Diffusely increased parenchymal echogenicity of both kidneys, suggesting chronic medical renal disease. No hydronephrosis. Electronically Signed   By: Kennith Center M.D.   On: 09/11/2022 11:48    Microbiology: Recent Results (from the past 240 hour(s))  Urine Culture     Status: Abnormal   Collection Time: 09/11/22 11:32 AM   Specimen: Urine, Clean Catch  Result Value Ref Range Status   Specimen Description   Final    URINE, CLEAN CATCH Performed at [facility], [address], [place], [place] [address]    Special Requests   Final    NONE Performed at [facility], [address], [place], [place] [address]    Culture (A)  Final    <10,000 COLONIES/mL INSIGNIFICANT GROWTH Performed at [facility], [address], [place], [place] [address]    Report Status 09/12/2022 FINAL  Final  Culture, blood (Routine X 2) w Reflex to ID Panel     Status: None (Preliminary result)   Collection Time: 09/11/22 11:37 AM   Specimen: BLOOD LEFT ARM  Result Value Ref Range Status   Specimen Description   Final    BLOOD LEFT ARM Performed at [facility], [address] [place], [place] [address]    Special  Requests   Final    BOTTLES DRAWN AEROBIC AND ANAEROBIC Blood Culture adequate volume Performed at [facility], [address], [place], [place] [address]    Culture   Final    NO GROWTH 4 DAYS Performed at [facility], [address] [place], [place] [address]    Report Status PENDING  Incomplete  Culture, blood (Routine X 2) w Reflex to ID Panel     Status: None (Preliminary result)   Collection Time: 09/11/22 11:54 AM   Specimen: BLOOD RIGHT ARM  Result Value Ref Range Status   Specimen Description   Final  BLOOD RIGHT ARM Performed at [facility], [address] [place], [place] [address]    Special Requests   Final    BOTTLES DRAWN AEROBIC AND ANAEROBIC Blood Culture adequate volume Performed at [facility], [address] [place], [place] [address]    Culture   Final    NO GROWTH 4 DAYS Performed at [facility], [address] [place], [place] [address]    Report Status PENDING  Incomplete  SARS Coronavirus 2 by RT PCR (hospital order, performed in [facility] hospital lab) *cepheid single result test* Anterior Nasal Swab     Status: None   Collection Time: 09/11/22 12:39 PM   Specimen: Anterior Nasal Swab  Result Value Ref Range Status   SARS Coronavirus 2 by RT PCR NEGATIVE NEGATIVE Final    Comment: (NOTE) SARS-CoV-2 target nucleic acids are NOT DETECTED.  The SARS-CoV-2 RNA is generally detectable in upper and lower respiratory specimens during the acute phase of infection. The lowest concentration of SARS-CoV-2 viral copies this assay can detect is 250 copies / mL. A negative result does not preclude SARS-CoV-2 infection and should not be used as the sole basis for treatment or other patient management decisions.  A negative result may occur with improper specimen collection / handling, submission of specimen other than nasopharyngeal swab, presence of viral mutation(s) within  the areas targeted by this assay, and inadequate number of viral copies (<250 copies / mL). A negative result must be combined with clinical observations, patient history, and epidemiological information.  Fact Sheet for Patients:   RoadLapTop.co.za  Fact Sheet for Healthcare Providers: http://kim-miller.com/  This test is not yet approved or  cleared by the [place] FDA and has been authorized for detection and/or diagnosis of SARS-CoV-2 by FDA under an Emergency Use Authorization (EUA).  This EUA will remain in effect (meaning this test can be used) for the duration of the COVID-19 declaration under Section 564(b)(1) of the Act, 21 U.S.C. section 360bbb-3(b)(1), unless the authorization is terminated or revoked sooner.  Performed at [facility], [address], [place], [place] [address]      Labs: Basic Metabolic Panel: Recent Labs  Lab 09/11/22 1358 09/12/22 0720 09/12/22 1528 09/13/22 0708 09/14/22 0547  NA 133* 135 138 137 140  K 5.1 5.2* 5.2* 5.1 4.9  CL 110 113* 115* 114* 116*  CO2 14* 16* 16* 16* 17*  GLUCOSE 102* 85 95 87 91  BUN 77* 69* 58* 49* 35*  CREATININE 5.60* 4.06* 3.41* 2.65* 1.96*  CALCIUM 8.4* 8.7* 9.0 8.9 9.5   Liver Function Tests: Recent Labs  Lab 09/10/22 1022 09/10/22 2153 09/11/22 1137 09/13/22 0708 09/14/22 0547  AST 350* 283* 194* 113* 78*  ALT 193* 173* 137* 82* 68*  ALKPHOS 153* 147* 144* 156* 184*  BILITOT 2.4* 2.3* 2.7* 4.1* 3.6*  PROT 6.8 6.6 6.8 6.6 7.1  ALBUMIN 3.8 3.6 3.4* 3.5 3.5   No results for input(s): "LIPASE", "AMYLASE" in the last 168 hours. No results for input(s): "AMMONIA" in the last 168 hours. CBC: Recent Labs  Lab 09/10/22 1022 09/11/22 0607 09/12/22 0720 09/13/22 0708 09/14/22 0547  WBC 13.6* 11.0* 11.9* 9.8 10.5  NEUTROABS 8.0*  --   --  5.8 6.2  HGB 5.5* 7.0* 8.4* 8.3* 8.8*  HCT 15.1* 19.4* 24.7* 24.8* 25.5*  MCV 79.9* 80.5  81.3 81.6 79.7*  PLT 139* 139* 112* 116* 143*   Cardiac Enzymes: No results for  input(s): "CKTOTAL", "CKMB", "CKMBINDEX", "TROPONINI" in the last 168 hours. BNP: Invalid input(s): "POCBNP" CBG: No results for input(s): "GLUCAP" in the last 168 hours.  Time coordinating discharge: 50 minutes  Signed:  [facility] 09/15/2022, 11:53 AM

## 2022-09-15 NOTE — Progress Notes (Signed)
Patient discharged to home with family, discharge instructions reviewed with patient who verbalized understanding. 

## 2022-09-16 LAB — CULTURE, BLOOD (ROUTINE X 2)
Culture: NO GROWTH
Culture: NO GROWTH
Special Requests: ADEQUATE
Special Requests: ADEQUATE

## 2022-09-17 ENCOUNTER — Encounter: Payer: Self-pay | Admitting: *Deleted

## 2022-09-17 ENCOUNTER — Telehealth: Payer: Self-pay | Admitting: *Deleted

## 2022-09-17 NOTE — Patient Outreach (Signed)
  Care Coordination [facility] Note Transition Care Management Unsuccessful Follow-up Telephone Call  Date of discharge and from where:  Saturday, 09/15/22 [facility]; sickle cell pain/ crisis  Attempts:  1st Attempt  Reason for unsuccessful TCM follow-up call:  Unable to reach patient  attempted calls x 3 to patient using both numbers listed; first 2 attempts rang three times then went to immediate fast-busy signal; received automated outgoing voice message on number listed as mobile/ alternate number stating, "your call cannot be completed at this time; please hang up and try your call later;" unable to leave voice messages on either numbers requesting call back  Caryl Pina, RN, BSN, CCRN Alumnus RN CM Care Coordination/ Transition of Care- [facility] Care Management [phone]: direct office

## 2022-09-18 ENCOUNTER — Ambulatory Visit (INDEPENDENT_AMBULATORY_CARE_PROVIDER_SITE_OTHER): Payer: Medicare HMO | Admitting: Family Medicine

## 2022-09-18 ENCOUNTER — Encounter: Payer: Self-pay | Admitting: *Deleted

## 2022-09-18 ENCOUNTER — Telehealth: Payer: Self-pay | Admitting: *Deleted

## 2022-09-18 ENCOUNTER — Encounter: Payer: Self-pay | Admitting: Family Medicine

## 2022-09-18 ENCOUNTER — Other Ambulatory Visit (HOSPITAL_COMMUNITY): Payer: Self-pay

## 2022-09-18 ENCOUNTER — Other Ambulatory Visit: Payer: Self-pay | Admitting: Family Medicine

## 2022-09-18 VITALS — BP 121/65 | HR 54 | Temp 97.4°F | Wt 161.0 lb

## 2022-09-18 DIAGNOSIS — J189 Pneumonia, unspecified organism: Secondary | ICD-10-CM

## 2022-09-18 DIAGNOSIS — D571 Sickle-cell disease without crisis: Secondary | ICD-10-CM | POA: Diagnosis not present

## 2022-09-18 NOTE — Patient Outreach (Signed)
  Care Coordination [facility] Note Transition Care Management Unsuccessful Follow-up Telephone Call  Date of discharge and from where:  Saturday 09/15/22 [facility]; sickle cell pain/ crisis  Attempts:  2nd Attempt  Reason for unsuccessful TCM follow-up call:  Unable to reach patient  Unable to reach patient  attempted calls x 2 to patient using both numbers listed; first attempt rang three times then went to immediate fast-busy signal; received automated outgoing voice message on number listed as mobile/ alternate number stating, "your call cannot be completed at this time; please hang up and try your call later;" unable to leave voice messages on either numbers requesting call back   Caryl Pina, RN, BSN, CCRN Alumnus RN CM Care Coordination/ Transition of Care- [facility] Care Management [phone]: direct office

## 2022-09-18 NOTE — Progress Notes (Signed)
Established Patient Office Visit  Subjective   Patient ID: Scott Norton, male    DOB: 22-Dec-1965  Age: 56 y.o. MRN: [id number]  Chief Complaint  Patient presents with   Follow-up    Improved a little still fatigue    Scott Norton is a 56 year old male with a medical history significant for sickle cell disease, chronic pain syndrome, opiate dependence and tolerance presents for a post hospital follow-up. Patient was admitted to [facility] from 11/20-11/25/2023 for community acquired pneumonia in the setting of sickle cell disease. Patient was treated with pain medications and IV antibiotics throughout admission. He also underwent therapeutic phlebotomy without complication. Patient was discharged home with oral antibiotics.   Today, [name] is having pain primarily to back and lower extremities.  He states that he has had chronic back pain over the past several years.  Patient states that he was previously told that he had arthritis in his back.  He currently rates his pain as 6/10.  Patient last had Percocet this a.m. without sustained relief.  He denies any chest pain, shortness of breath, urinary symptoms, nausea, vomiting, or diarrhea.    Patient Active Problem List   Diagnosis Date Noted   Weakness generalized 09/13/2022   Malnutrition of moderate degree 09/12/2022   Hemolytic crisis 09/12/2022   Acute hypoxemic respiratory failure (HCC) 06/10/2021   Sickle cell anemia with crisis (HCC) 06/10/2021   Suicidal ideation    Acute exacerbation of CHF (congestive heart failure) (HCC) 03/13/2021   Depression 03/01/2021   Cocaine abuse with cocaine-induced mood disorder (HCC) 03/01/2021   Sickle-cell crisis (HCC) 02/08/2021   Hyperkalemia 02/08/2021   Thrombocytopenia (HCC) 02/08/2021   Leukocytosis 02/08/2021   AKI (acute kidney injury) (HCC) 02/08/2021   Cocaine abuse (HCC) 02/08/2021   Homelessness 09/14/2020   Suicidal ideations 09/04/2020   Tobacco use 09/04/2020    Sickle cell pain crisis (HCC) 04/11/2015   Vitamin D deficiency 01/31/2015   Chronic pain 06/30/2014   Respiratory infection 08/13/2012   Healthcare maintenance 06/04/2012   Cervical strain, acute 12/09/2011   Neck sprain and strain 12/09/2011   Sickle cell anemia (HCC) 11/29/2011   Seasonal allergies 11/29/2011   Seasonal allergic rhinitis 11/29/2011   Past Medical History:  Diagnosis Date   AKI (acute kidney injury) (HCC) 02/08/2021   Anemia    Depression    Heart murmur    Seasonal allergies    wool & grass   Sickle cell anemia (HCC)    Past Surgical History:  Procedure Laterality Date   NO PAST SURGERIES     ROOT CANAL     Social History   Tobacco Use   Smoking status: Some Days    Packs/day: 0.50    Types: Cigars, Cigarettes   Smokeless tobacco: Never   Tobacco comments:    10 cigars per week.  Vaping Use   Vaping Use: Never used  Substance Use Topics   Alcohol use: Not Currently   Drug use: Not Currently    Types: Marijuana, Cocaine   Social History   Socioeconomic History   Marital status: Single    Spouse name: Not on file   Number of children: Not on file   Years of education: Not on file   Highest education level: Not on file  Occupational History   Not on file  Tobacco Use   Smoking status: Some Days    Packs/day: 0.50    Types: Cigars, Cigarettes   Smokeless tobacco: Never  Tobacco comments:    10 cigars per week.  Vaping Use   Vaping Use: Never used  Substance and Sexual Activity   Alcohol use: Not Currently   Drug use: Not Currently    Types: Marijuana, Cocaine   Sexual activity: Yes  Other Topics Concern   Not on file  Social History Narrative   Lives with Friends in [place]. Working on housing for himself   On disability for his SS disease      No children, no stable relationship   Social Determinants of Corporate investment banker Strain: Not on file  Food Insecurity: Food Insecurity Present (09/10/2022)   Hunger Vital Sign     Worried About Running Out of Food in the Last Year: Often true    Ran Out of Food in the Last Year: Often true  Transportation Needs: No Transportation Needs (09/10/2022)   PRAPARE - Administrator, Civil Service (Medical): No    Lack of Transportation (Non-Medical): No  Physical Activity: Not on file  Stress: Not on file  Social Connections: Not on file  Intimate Partner Violence: Not At Risk (09/10/2022)   Humiliation, Afraid, Rape, and Kick questionnaire    Fear of Current or Ex-Partner: No    Emotionally Abused: No    Physically Abused: No    Sexually Abused: No   Family Status  Relation Name Status   Mother  Alive   Father  Deceased   Sister  (Not Specified)   Brother  (Not Specified)   MGM  (Not Specified)   MGF  (Not Specified)   PGM  (Not Specified)   PGF  (Not Specified)   Family History  Problem Relation Age of Onset   Alcohol abuse Mother    Sickle cell trait Mother    Hypertension Mother    Alcohol abuse Father    Sickle cell trait Father    Early death Father        due to alcoholism   Hypertension Father    Hypertension Sister    Hypertension Brother    Hypertension Maternal Grandmother    Hypertension Maternal Grandfather    Hypertension Paternal Grandmother    Hypertension Paternal Grandfather    Allergies  Allergen Reactions   Aspirin Other (See Comments)    Increased heart rate   Tape Other (See Comments)    rash   Tramadol Hives and Itching      Review of Systems  Constitutional: Negative.  Negative for fever and malaise/fatigue.  HENT: Negative.    Respiratory: Negative.    Gastrointestinal: Negative.   Genitourinary: Negative.   Musculoskeletal:  Positive for back pain and joint pain.  Neurological: Negative.   Endo/Heme/Allergies: Negative.   Psychiatric/Behavioral: Negative.        Objective:     BP 121/65   Pulse (!) 54   Temp (!) 97.4 F (36.3 C)   Wt 161 lb (73 kg)   SpO2 100%   BMI 20.12 kg/m  BP  Readings from Last 3 Encounters:  09/18/22 121/65  09/15/22 108/64  08/07/22 133/60   Wt Readings from Last 3 Encounters:  09/18/22 161 lb (73 kg)  09/10/22 168 lb (76.2 kg)  08/07/22 172 lb 9.6 oz (78.3 kg)      Physical Exam Constitutional:      Appearance: Normal appearance.  Cardiovascular:     Rate and Rhythm: Normal rate and regular rhythm.  Neurological:     Mental Status: He is alert.  No results found for any visits on 09/18/22.  Last CBC Lab Results  Component Value Date   WBC 10.5 09/14/2022   HGB 8.8 (L) 09/14/2022   HCT 25.5 (L) 09/14/2022   MCV 79.7 (L) 09/14/2022   MCH 27.5 09/14/2022   RDW 25.5 (H) 09/14/2022   PLT 143 (L) 09/14/2022   Last metabolic panel Lab Results  Component Value Date   GLUCOSE 91 09/14/2022   NA 140 09/14/2022   K 4.9 09/14/2022   CL 116 (H) 09/14/2022   CO2 17 (L) 09/14/2022   BUN 35 (H) 09/14/2022   CREATININE 1.96 (H) 09/14/2022   GFRNONAA 40 (L) 09/14/2022   CALCIUM 9.5 09/14/2022   PROT 7.1 09/14/2022   ALBUMIN 3.5 09/14/2022   LABGLOB 2.6 08/07/2022   AGRATIO 1.7 08/07/2022   BILITOT 3.6 (H) 09/14/2022   ALKPHOS 184 (H) 09/14/2022   AST 78 (H) 09/14/2022   ALT 68 (H) 09/14/2022   ANIONGAP 7 09/14/2022   Last lipids Lab Results  Component Value Date   CHOL 127 03/01/2021   HDL 43 03/01/2021   LDLCALC 66 03/01/2021   TRIG 89 03/01/2021   CHOLHDL 3.0 03/01/2021   Last hemoglobin A1c Lab Results  Component Value Date   HGBA1C <4.2 (L) 03/01/2021   Last thyroid functions Lab Results  Component Value Date   TSH 1.730 03/01/2021   Last vitamin D Lab Results  Component Value Date   VD25OH 24.3 (L) 08/07/2022   Last vitamin B12 and Folate No results found for: "VITAMINB12", "FOLATE"    The ASCVD Risk score (Arnett DK, et al., 2019) failed to calculate for the following reasons:   The valid total cholesterol range is 130 to 320 mg/dL    Assessment & Plan:   Problem List Items Addressed This  Visit       Other   Sickle cell anemia (HCC)   Relevant Orders   CBC   Other Visit Diagnoses     Pneumonia of both lungs due to infectious organism, unspecified part of lung    -  Primary   Relevant Orders   DG Chest 2 View     1. Pneumonia of both lungs due to infectious organism, unspecified part of lung We will repeat patient's chest x-ray in 4 weeks.  Patient advised to complete Augmentin as prescribed. - DG Chest 2 View; Future  2. Sickle cell disease without crisis (HCC) Recommend folic acid 1 mg daily Increase fluid intake to 64 ounces per day for  - CBC   Return in about 3 months (around 12/19/2022).    [name]  APRN, MSN, FNP-C Patient [facility] [address] [place], [place] [address] [phone]

## 2022-09-19 ENCOUNTER — Encounter: Payer: Self-pay | Admitting: *Deleted

## 2022-09-19 ENCOUNTER — Telehealth: Payer: Self-pay | Admitting: *Deleted

## 2022-09-19 LAB — CBC
Hematocrit: 30.6 % — ABNORMAL LOW (ref 37.5–51.0)
Hemoglobin: 10 g/dL — ABNORMAL LOW (ref 13.0–17.7)
MCH: 27.6 pg (ref 26.6–33.0)
MCHC: 32.7 g/dL (ref 31.5–35.7)
MCV: 85 fL (ref 79–97)
NRBC: 16 % — ABNORMAL HIGH (ref 0–0)
Platelets: 184 10*3/uL (ref 150–450)
RBC: 3.62 x10E6/uL — ABNORMAL LOW (ref 4.14–5.80)
RDW: 23.3 % — ABNORMAL HIGH (ref 11.6–15.4)
WBC: 7.6 10*3/uL (ref 3.4–10.8)

## 2022-09-19 NOTE — Patient Outreach (Signed)
  Care Coordination [facility] Note Transition Care Management Unsuccessful Follow-up Telephone Call  Date of discharge and from where:  Saturday, 09/15/22 [facility]; sickle cell crisis with CAP  Attempts:  3rd Attempt  Reason for unsuccessful TCM follow-up call:  Unable to reach patient -- Phone again rang 3 times and then immediately/ automatically went to fast-busy; again unable to leave patient voice message requesting call-back   Caryl Pina, RN, BSN, CCRN Alumnus RN CM Care Coordination/ Transition of Care- [facility] Care Management [phone]: direct office

## 2022-09-21 ENCOUNTER — Encounter: Payer: Medicare HMO | Admitting: Gastroenterology

## 2022-09-22 ENCOUNTER — Other Ambulatory Visit: Payer: Self-pay | Admitting: Family Medicine

## 2022-09-22 ENCOUNTER — Other Ambulatory Visit (HOSPITAL_COMMUNITY): Payer: Self-pay

## 2022-09-22 DIAGNOSIS — G894 Chronic pain syndrome: Secondary | ICD-10-CM

## 2022-09-24 ENCOUNTER — Other Ambulatory Visit: Payer: Self-pay | Admitting: Internal Medicine

## 2022-09-24 ENCOUNTER — Other Ambulatory Visit (HOSPITAL_COMMUNITY): Payer: Self-pay

## 2022-09-24 DIAGNOSIS — G894 Chronic pain syndrome: Secondary | ICD-10-CM

## 2022-09-24 DIAGNOSIS — D571 Sickle-cell disease without crisis: Secondary | ICD-10-CM

## 2022-09-24 MED ORDER — IBUPROFEN 800 MG PO TABS
800.0000 mg | ORAL_TABLET | Freq: Three times a day (TID) | ORAL | 1 refills | Status: DC | PRN
  Filled 2022-09-24: qty 60, 20d supply, fill #0
  Filled 2022-10-12: qty 60, 20d supply, fill #1

## 2022-09-24 NOTE — Telephone Encounter (Signed)
Caller & Relationship to patient:  MRN #  [id number]   Call Back Number:   Date of Last Office Visit: 09/18/2022     Date of Next Office Visit: 12/25/2022    Medication(s) to be Refilled: ibuprofen  Preferred Pharmacy:   ** Please notify patient to allow 48-72 hours to process** **Let patient know to contact pharmacy at the end of the day to make sure medication is ready. ** **If patient has not been seen in a year or longer, book an appointment **Advise to use MyChart for refill requests OR to contact their pharmacy

## 2022-09-27 ENCOUNTER — Other Ambulatory Visit (HOSPITAL_COMMUNITY): Payer: Self-pay

## 2022-09-29 ENCOUNTER — Other Ambulatory Visit (HOSPITAL_COMMUNITY): Payer: Self-pay

## 2022-10-01 ENCOUNTER — Other Ambulatory Visit (HOSPITAL_COMMUNITY): Payer: Self-pay

## 2022-10-01 ENCOUNTER — Other Ambulatory Visit: Payer: Self-pay | Admitting: Family Medicine

## 2022-10-01 ENCOUNTER — Other Ambulatory Visit: Payer: Self-pay | Admitting: Internal Medicine

## 2022-10-01 DIAGNOSIS — G894 Chronic pain syndrome: Secondary | ICD-10-CM

## 2022-10-01 DIAGNOSIS — D571 Sickle-cell disease without crisis: Secondary | ICD-10-CM

## 2022-10-01 MED ORDER — OXYCODONE-ACETAMINOPHEN 10-325 MG PO TABS
1.0000 | ORAL_TABLET | Freq: Four times a day (QID) | ORAL | 0 refills | Status: DC | PRN
  Filled 2022-10-01: qty 60, 15d supply, fill #0

## 2022-10-01 NOTE — Telephone Encounter (Signed)
Requested medication (s) are due for refill today: yes  Requested medication (s) are on the active medication list: yes  Last refill:  09/15/22 #60  Future visit scheduled: yes  Notes to clinic:  med not delegated to NT to RF- Prescriber is a another practice   Requested Prescriptions  Pending Prescriptions Disp Refills   oxyCODONE-acetaminophen (PERCOCET) 10-325 MG tablet 60 tablet 0    Sig: Take 1 tablet by mouth every 6 hours as needed for pain.     There is no refill protocol information for this order

## 2022-10-01 NOTE — Progress Notes (Signed)
Reviewed PDMP substance reporting system prior to prescribing opiate medications. No inconsistencies noted.  Meds ordered this encounter  Medications   oxyCODONE-acetaminophen (PERCOCET) 10-325 MG tablet    Sig: Take 1 tablet by mouth every 6 hours as needed for pain.    Dispense:  60 tablet    Refill:  0    Order Specific Question:   Supervising Provider    Answer:   JEGEDE, OLUGBEMIGA E [[id number]]   Aviona Martenson Moore Joann Jorge  APRN, MSN, FNP-C Patient [facility] [address]  [place], [place] [address] [phone]  

## 2022-10-02 ENCOUNTER — Other Ambulatory Visit: Payer: Self-pay | Admitting: Family Medicine

## 2022-10-12 ENCOUNTER — Other Ambulatory Visit (HOSPITAL_COMMUNITY): Payer: Self-pay

## 2022-10-12 ENCOUNTER — Other Ambulatory Visit: Payer: Self-pay | Admitting: Family Medicine

## 2022-10-12 DIAGNOSIS — G894 Chronic pain syndrome: Secondary | ICD-10-CM

## 2022-10-12 DIAGNOSIS — D571 Sickle-cell disease without crisis: Secondary | ICD-10-CM

## 2022-10-12 MED ORDER — OXYCODONE-ACETAMINOPHEN 10-325 MG PO TABS
1.0000 | ORAL_TABLET | Freq: Four times a day (QID) | ORAL | 0 refills | Status: DC | PRN
  Filled 2022-10-16: qty 60, 15d supply, fill #0

## 2022-10-12 NOTE — Telephone Encounter (Signed)
Please advise KH 

## 2022-10-12 NOTE — Progress Notes (Signed)
Reviewed PDMP substance reporting system prior to prescribing opiate medications. No inconsistencies noted.  Meds ordered this encounter  Medications   oxyCODONE-acetaminophen (PERCOCET) 10-325 MG tablet    Sig: Take 1 tablet by mouth every 6 hours as needed for pain.    Dispense:  60 tablet    Refill:  0    Order Specific Question:   Supervising Provider    Answer:   JEGEDE, OLUGBEMIGA E [[id number]]   Ahyana Skillin Moore Theseus Birnie  APRN, MSN, FNP-C Patient Care [facility] [address]  [place], [place] [address] [phone]  

## 2022-10-16 ENCOUNTER — Other Ambulatory Visit (HOSPITAL_COMMUNITY): Payer: Self-pay

## 2022-10-26 ENCOUNTER — Other Ambulatory Visit: Payer: Self-pay | Admitting: Family Medicine

## 2022-10-26 DIAGNOSIS — D571 Sickle-cell disease without crisis: Secondary | ICD-10-CM

## 2022-10-26 DIAGNOSIS — G894 Chronic pain syndrome: Secondary | ICD-10-CM

## 2022-10-29 ENCOUNTER — Other Ambulatory Visit (HOSPITAL_COMMUNITY): Payer: Self-pay

## 2022-10-29 ENCOUNTER — Other Ambulatory Visit: Payer: Self-pay | Admitting: Family Medicine

## 2022-10-29 DIAGNOSIS — D571 Sickle-cell disease without crisis: Secondary | ICD-10-CM

## 2022-10-29 DIAGNOSIS — G894 Chronic pain syndrome: Secondary | ICD-10-CM

## 2022-10-29 MED ORDER — OXYCODONE-ACETAMINOPHEN 10-325 MG PO TABS
1.0000 | ORAL_TABLET | Freq: Four times a day (QID) | ORAL | 0 refills | Status: DC | PRN
  Filled 2022-10-31: qty 60, 15d supply, fill #0

## 2022-10-29 NOTE — Progress Notes (Signed)
Reviewed PDMP substance reporting system prior to prescribing opiate medications. No inconsistencies noted.  Meds ordered this encounter  Medications   oxyCODONE-acetaminophen (PERCOCET) 10-325 MG tablet    Sig: Take 1 tablet by mouth every 6 hours as needed for pain.    Dispense:  60 tablet    Refill:  0    Order Specific Question:   Supervising Provider    Answer:   JEGEDE, OLUGBEMIGA E [[id number]]   Rogerio Boutelle Moore Griffen Frayne  APRN, MSN, FNP-C Patient Care Center [facility] [address]  [place], [place] [address] [phone]  

## 2022-10-29 NOTE — Telephone Encounter (Signed)
Caller & Relationship to patient:  MRN #  [id number]   Call Back Number:   Date of Last Office Visit: 10/12/2022     Date of Next Office Visit: 12/25/2022    Medication(s) to be Refilled: oxycodone  Preferred Pharmacy: [facility]  ** Please notify patient to allow 48-72 hours to process** **Let patient know to contact pharmacy at the end of the day to make sure medication is ready. ** **If patient has not been seen in a year or longer, book an appointment **Advise to use MyChart for refill requests OR to contact their pharmacy

## 2022-10-31 ENCOUNTER — Other Ambulatory Visit (HOSPITAL_COMMUNITY): Payer: Self-pay

## 2022-10-31 ENCOUNTER — Other Ambulatory Visit: Payer: Self-pay

## 2022-11-06 ENCOUNTER — Ambulatory Visit: Payer: Self-pay | Admitting: Family Medicine

## 2022-11-09 ENCOUNTER — Other Ambulatory Visit: Payer: Self-pay | Admitting: Family Medicine

## 2022-11-09 DIAGNOSIS — D571 Sickle-cell disease without crisis: Secondary | ICD-10-CM

## 2022-11-09 DIAGNOSIS — G894 Chronic pain syndrome: Secondary | ICD-10-CM

## 2022-11-12 ENCOUNTER — Other Ambulatory Visit (HOSPITAL_COMMUNITY): Payer: Self-pay

## 2022-11-12 ENCOUNTER — Other Ambulatory Visit: Payer: Self-pay | Admitting: Family Medicine

## 2022-11-12 DIAGNOSIS — G894 Chronic pain syndrome: Secondary | ICD-10-CM

## 2022-11-12 DIAGNOSIS — D571 Sickle-cell disease without crisis: Secondary | ICD-10-CM

## 2022-11-12 MED ORDER — OXYCODONE-ACETAMINOPHEN 10-325 MG PO TABS
1.0000 | ORAL_TABLET | Freq: Four times a day (QID) | ORAL | 0 refills | Status: DC | PRN
  Filled 2022-11-15 (×2): qty 60, 15d supply, fill #0

## 2022-11-12 MED ORDER — IBUPROFEN 800 MG PO TABS
800.0000 mg | ORAL_TABLET | Freq: Three times a day (TID) | ORAL | 1 refills | Status: DC | PRN
  Filled 2022-11-12: qty 60, 20d supply, fill #0
  Filled 2022-12-25: qty 60, 20d supply, fill #1

## 2022-11-12 NOTE — Progress Notes (Signed)
Reviewed PDMP substance reporting system prior to prescribing opiate medications. No inconsistencies noted.  Meds ordered this encounter  Medications   oxyCODONE-acetaminophen (PERCOCET) 10-325 MG tablet    Sig: Take 1 tablet by mouth every 6 hours as needed for pain.    Dispense:  60 tablet    Refill:  0    Order Specific Question:   Supervising Provider    Answer:   Tresa Garter [id number]   ibuprofen (ADVIL) 800 MG tablet    Sig: Take 1 tablet by mouth every 8 hours as needed.    Dispense:  60 tablet    Refill:  1    Order Specific Question:   Supervising Provider    Answer:   Tresa Garter [[id number]]   Scott Pounds  APRN, MSN, FNP-C Patient [facility] [address] [place], [place] [address] [phone]

## 2022-11-15 ENCOUNTER — Other Ambulatory Visit (HOSPITAL_COMMUNITY): Payer: Self-pay

## 2022-11-25 ENCOUNTER — Other Ambulatory Visit: Payer: Self-pay | Admitting: Family Medicine

## 2022-11-25 DIAGNOSIS — D571 Sickle-cell disease without crisis: Secondary | ICD-10-CM

## 2022-11-25 DIAGNOSIS — G894 Chronic pain syndrome: Secondary | ICD-10-CM

## 2022-11-27 ENCOUNTER — Other Ambulatory Visit (HOSPITAL_COMMUNITY): Payer: Self-pay

## 2022-11-27 ENCOUNTER — Other Ambulatory Visit: Payer: Self-pay | Admitting: Family Medicine

## 2022-11-27 DIAGNOSIS — D571 Sickle-cell disease without crisis: Secondary | ICD-10-CM

## 2022-11-27 DIAGNOSIS — G894 Chronic pain syndrome: Secondary | ICD-10-CM

## 2022-11-27 MED ORDER — OXYCODONE-ACETAMINOPHEN 10-325 MG PO TABS
1.0000 | ORAL_TABLET | Freq: Four times a day (QID) | ORAL | 0 refills | Status: DC | PRN
  Filled 2022-11-30 (×2): qty 60, 15d supply, fill #0

## 2022-11-27 NOTE — Telephone Encounter (Signed)
Caller & Relationship to patient:  MRN #  [id number]   Call Back Number:   Date of Last Office Visit: 11/12/2022     Date of Next Office Visit: 12/25/2022    Medication(s) to be Refilled:  see below  Preferred Pharmacy: [facility]  ** Please notify patient to allow 48-72 hours to process** **Let patient know to contact pharmacy at the end of the day to make sure medication is ready. ** **If patient has not been seen in a year or longer, book an appointment **Advise to use MyChart for refill requests OR to contact their pharmacy

## 2022-11-27 NOTE — Progress Notes (Signed)
Reviewed PDMP substance reporting system prior to prescribing opiate medications. No inconsistencies noted.  Meds ordered this encounter  Medications   oxyCODONE-acetaminophen (PERCOCET) 10-325 MG tablet    Sig: Take 1 tablet by mouth every 6 hours as needed for pain.    Dispense:  60 tablet    Refill:  0    Order Specific Question:   Supervising Provider    Answer:   JEGEDE, OLUGBEMIGA E [[id number]]   Talib Headley Moore Kahli Mayon  APRN, MSN, FNP-C Patient Care Center [facility] [address]  [place], [place] [address] [phone]  

## 2022-11-30 ENCOUNTER — Other Ambulatory Visit (HOSPITAL_COMMUNITY): Payer: Self-pay

## 2022-11-30 ENCOUNTER — Other Ambulatory Visit (HOSPITAL_BASED_OUTPATIENT_CLINIC_OR_DEPARTMENT_OTHER): Payer: Self-pay

## 2022-12-11 ENCOUNTER — Other Ambulatory Visit: Payer: Self-pay | Admitting: Family Medicine

## 2022-12-11 DIAGNOSIS — D571 Sickle-cell disease without crisis: Secondary | ICD-10-CM

## 2022-12-11 DIAGNOSIS — G894 Chronic pain syndrome: Secondary | ICD-10-CM

## 2022-12-14 ENCOUNTER — Other Ambulatory Visit (HOSPITAL_COMMUNITY): Payer: Self-pay

## 2022-12-14 ENCOUNTER — Other Ambulatory Visit: Payer: Self-pay | Admitting: Family Medicine

## 2022-12-14 DIAGNOSIS — G894 Chronic pain syndrome: Secondary | ICD-10-CM

## 2022-12-14 DIAGNOSIS — D571 Sickle-cell disease without crisis: Secondary | ICD-10-CM

## 2022-12-14 MED ORDER — OXYCODONE-ACETAMINOPHEN 10-325 MG PO TABS
1.0000 | ORAL_TABLET | Freq: Four times a day (QID) | ORAL | 0 refills | Status: DC | PRN
  Filled 2022-12-15: qty 60, 15d supply, fill #0

## 2022-12-14 NOTE — Progress Notes (Signed)
Reviewed PDMP substance reporting system prior to prescribing opiate medications. No inconsistencies noted.  Meds ordered this encounter  Medications   oxyCODONE-acetaminophen (PERCOCET) 10-325 MG tablet    Sig: Take 1 tablet by mouth every 6 hours as needed for pain.    Dispense:  60 tablet    Refill:  0    Order Specific Question:   Supervising Provider    Answer:   Tresa Garter [id number]   Donia Pounds  APRN, MSN, FNP-C Patient [facility] [address] [place], [place] [address] [phone]

## 2022-12-15 ENCOUNTER — Other Ambulatory Visit (HOSPITAL_COMMUNITY): Payer: Self-pay

## 2022-12-25 ENCOUNTER — Ambulatory Visit (INDEPENDENT_AMBULATORY_CARE_PROVIDER_SITE_OTHER): Payer: 59 | Admitting: Family Medicine

## 2022-12-25 ENCOUNTER — Other Ambulatory Visit: Payer: Self-pay | Admitting: Family Medicine

## 2022-12-25 VITALS — BP 108/49 | HR 78 | Temp 97.3°F | Wt 177.0 lb

## 2022-12-25 DIAGNOSIS — E559 Vitamin D deficiency, unspecified: Secondary | ICD-10-CM

## 2022-12-25 DIAGNOSIS — G894 Chronic pain syndrome: Secondary | ICD-10-CM

## 2022-12-25 DIAGNOSIS — Z23 Encounter for immunization: Secondary | ICD-10-CM

## 2022-12-25 DIAGNOSIS — D571 Sickle-cell disease without crisis: Secondary | ICD-10-CM

## 2022-12-25 NOTE — Progress Notes (Signed)
Established Patient Office Visit  Subjective   Patient ID: Scott Norton, male    DOB: 1966-04-27  Age: 57 y.o. MRN: [id number]  Chief Complaint  Patient presents with   Sickle Cell Anemia    Waris Rodger is  57 year old male with a medical history significant for sickle cell disease, chronic pain syndrome, opiate dependence and tolerance, and history of anemia of chronic disease presents for a follow up of chronic conditions. Harrel says that he is doing well without complaint. Patient has chronic pain that typically occurs in lower back. He says that pain is well controlled with percocet 10-325 mg every 6 hours as needed. Currently, pain intensity is 5/10, which is his baseline. He denies headache, chest pain, shortness of breath, nausea, vomiting, or diarrhea.     Patient Active Problem List   Diagnosis Date Noted   Weakness generalized 09/13/2022   Malnutrition of moderate degree 09/12/2022   Hemolytic crisis 09/12/2022   Acute hypoxemic respiratory failure 06/10/2021   Sickle cell anemia with crisis 06/10/2021   Suicidal ideation    Acute exacerbation of CHF (congestive heart failure) 03/13/2021   Depression 03/01/2021   Cocaine abuse with cocaine-induced mood disorder 03/01/2021   Sickle-cell crisis 02/08/2021   Hyperkalemia 02/08/2021   Thrombocytopenia 02/08/2021   Leukocytosis 02/08/2021   AKI (acute kidney injury) 02/08/2021   Cocaine abuse 02/08/2021   Homelessness 09/14/2020   Suicidal ideations 09/04/2020   Tobacco use 09/04/2020   Sickle cell pain crisis 04/11/2015   Vitamin D deficiency 01/31/2015   Chronic pain 06/30/2014   Respiratory infection 08/13/2012   Healthcare maintenance 06/04/2012   Cervical strain, acute 12/09/2011   Neck sprain and strain 12/09/2011   Sickle cell anemia 11/29/2011   Seasonal allergies 11/29/2011   Seasonal allergic rhinitis 11/29/2011   Past Medical History:  Diagnosis Date   AKI (acute kidney injury) (HCC) 02/08/2021    Anemia    Depression    Heart murmur    Seasonal allergies    wool & grass   Sickle cell anemia (HCC)    Past Surgical History:  Procedure Laterality Date   NO PAST SURGERIES     ROOT CANAL     Social History   Tobacco Use   Smoking status: Some Days    Packs/day: .5    Types: Cigars, Cigarettes   Smokeless tobacco: Never   Tobacco comments:    10 cigars per week.  Vaping Use   Vaping Use: Never used  Substance Use Topics   Alcohol use: Not Currently   Drug use: Not Currently    Types: Marijuana, Cocaine   Social History   Socioeconomic History   Marital status: Single    Spouse name: Not on file   Number of children: Not on file   Years of education: Not on file   Highest education level: Not on file  Occupational History   Not on file  Tobacco Use   Smoking status: Some Days    Packs/day: .5    Types: Cigars, Cigarettes   Smokeless tobacco: Never   Tobacco comments:    10 cigars per week.  Vaping Use   Vaping Use: Never used  Substance and Sexual Activity   Alcohol use: Not Currently   Drug use: Not Currently    Types: Marijuana, Cocaine   Sexual activity: Yes  Other Topics Concern   Not on file  Social History Narrative   Lives with Friends in [place]. Working on housing for  himself   On disability for his SS disease      No children, no stable relationship   Social Determinants of Health   Financial Resource Strain: Not on file  Food Insecurity: Food Insecurity Present (09/10/2022)   Hunger Vital Sign    Worried About Running Out of Food in the Last Year: Often true    Ran Out of Food in the Last Year: Often true  Transportation Needs: No Transportation Needs (09/10/2022)   PRAPARE - Administrator, Civil Service (Medical): No    Lack of Transportation (Non-Medical): No  Physical Activity: Not on file  Stress: Not on file  Social Connections: Not on file  Intimate Partner Violence: Not At Risk (09/10/2022)   Humiliation, Afraid,  Rape, and Kick questionnaire    Fear of Current or Ex-Partner: No    Emotionally Abused: No    Physically Abused: No    Sexually Abused: No   Family Status  Relation Name Status   Mother  Alive   Father  Deceased   Sister  (Not Specified)   Brother  (Not Specified)   MGM  (Not Specified)   MGF  (Not Specified)   PGM  (Not Specified)   PGF  (Not Specified)   Family History  Problem Relation Age of Onset   Alcohol abuse Mother    Sickle cell trait Mother    Hypertension Mother    Alcohol abuse Father    Sickle cell trait Father    Early death Father        due to alcoholism   Hypertension Father    Hypertension Sister    Hypertension Brother    Hypertension Maternal Grandmother    Hypertension Maternal Grandfather    Hypertension Paternal Grandmother    Hypertension Paternal Grandfather    Allergies  Allergen Reactions   Aspirin Other (See Comments)    Increased heart rate   Tape Other (See Comments)    rash   Tramadol Hives and Itching      Review of Systems  Constitutional: Negative.   HENT: Negative.    Eyes: Negative.   Respiratory: Negative.    Cardiovascular: Negative.   Gastrointestinal: Negative.   Genitourinary: Negative.   Musculoskeletal:  Positive for back pain and joint pain.  Skin: Negative.   Endo/Heme/Allergies: Negative.   Psychiatric/Behavioral: Negative.        Objective:     BP (!) 108/49   Pulse 78   Temp (!) 97.3 F (36.3 C)   Wt 177 lb (80.3 kg)   SpO2 95%   BMI 22.12 kg/m  BP Readings from Last 3 Encounters:  12/25/22 (!) 108/49  09/18/22 121/65  09/15/22 108/64   Wt Readings from Last 3 Encounters:  12/25/22 177 lb (80.3 kg)  09/18/22 161 lb (73 kg)  09/10/22 168 lb (76.2 kg)      Physical Exam Constitutional:      Appearance: Normal appearance.  Eyes:     Pupils: Pupils are equal, round, and reactive to light.  Cardiovascular:     Rate and Rhythm: Normal rate and regular rhythm.  Pulmonary:     Effort:  Pulmonary effort is normal.  Abdominal:     General: Bowel sounds are normal.  Musculoskeletal:        General: Normal range of motion.  Skin:    General: Skin is warm.  Neurological:     General: No focal deficit present.     Mental Status: He is  alert. Mental status is at baseline.  Psychiatric:        Mood and Affect: Mood normal.        Behavior: Behavior normal.        Thought Content: Thought content normal.        Judgment: Judgment normal.      Results for orders placed or performed in visit on 12/25/22  [id number] 11+Oxyco+Alc+Crt-Bund  Result Value Ref Range   Ethanol Negative Cutoff=0.020 %   Amphetamines, Urine Negative Cutoff=1000 ng/mL   Barbiturate Negative Cutoff=200 ng/mL   BENZODIAZ UR QL Negative Cutoff=200 ng/mL   Cannabinoid Quant, Ur Negative Cutoff=50 ng/mL   Cocaine (Metabolite) Negative Cutoff=300 ng/mL   OPIATE SCREEN URINE Negative Cutoff=300 ng/mL   Oxycodone/Oxymorphone, Urine See Final Results Cutoff=300 ng/mL   Phencyclidine Negative Cutoff=25 ng/mL   Methadone Screen, Urine Negative Cutoff=300 ng/mL   Propoxyphene Negative Cutoff=300 ng/mL   Meperidine Negative Cutoff=200 ng/mL   Tramadol Negative Cutoff=200 ng/mL   Creatinine 106.8 20.0 - 300.0 mg/dL   pH, Urine 5.5 4.5 - 8.9  Sickle Cell Panel  Result Value Ref Range   Glucose 86 70 - 99 mg/dL   BUN 20 6 - 24 mg/dL   Creatinine, Ser 9.14 (H) 0.76 - 1.27 mg/dL   eGFR 46 (L) >78 GN/FAO/1.30   BUN/Creatinine Ratio 12 9 - 20   Sodium 140 134 - 144 mmol/L   Potassium 5.5 (H) 3.5 - 5.2 mmol/L   Chloride 110 (H) 96 - 106 mmol/L   CO2 17 (L) 20 - 29 mmol/L   Calcium 9.2 8.7 - 10.2 mg/dL   Total Protein 6.4 6.0 - 8.5 g/dL   Albumin 4.3 3.8 - 4.9 g/dL   Globulin, Total 2.1 1.5 - 4.5 g/dL   Albumin/Globulin Ratio 2.0 1.2 - 2.2   Bilirubin Total 2.0 (H) 0.0 - 1.2 mg/dL   Alkaline Phosphatase 234 (H) 44 - 121 IU/L   AST 36 0 - 40 IU/L   ALT 13 0 - 44 IU/L   Ferritin 304 30 - 400 ng/mL   Vit  D, 25-Hydroxy 31.0 30.0 - 100.0 ng/mL   WBC 7.6 3.4 - 10.8 x10E3/uL   RBC 2.44 (LL) 4.14 - 5.80 x10E6/uL   Hemoglobin 7.3 (L) 13.0 - 17.7 g/dL   Hematocrit 86.5 (L) 78.4 - 51.0 %   MCV 90 79 - 97 fL   MCH 29.9 26.6 - 33.0 pg   MCHC 33.3 31.5 - 35.7 g/dL   RDW 69.6 (H) 29.5 - 28.4 %   Platelets 214 150 - 450 x10E3/uL   Neutrophils 52 Not Estab. %   Lymphs 33 Not Estab. %   Monocytes 12 Not Estab. %   Eos 3 Not Estab. %   Basos 0 Not Estab. %   Neutrophils Absolute 3.9 1.4 - 7.0 x10E3/uL   Lymphocytes Absolute 2.5 0.7 - 3.1 x10E3/uL   Monocytes Absolute 0.9 0.1 - 0.9 x10E3/uL   EOS (ABSOLUTE) 0.2 0.0 - 0.4 x10E3/uL   Basophils Absolute 0.0 0.0 - 0.2 x10E3/uL   Immature Granulocytes 0 Not Estab. %   Immature Grans (Abs) 0.0 0.0 - 0.1 x10E3/uL   NRBC 11 (H) 0 - 0 %   Hematology Comments: Note:    Retic Ct Pct 10.0 (H) 0.6 - 2.6 %  Oxycodone/Oxymorphone, Confirm  Result Value Ref Range   OXYCODONE/OXYMORPH Positive (A) Cutoff=300   OXYCODONE Negative Cutoff=300   OXYMORPHONE Positive (A)    OXYMORPHONE (GC/MS) 377 Cutoff=300 ng/mL    Last  CBC Lab Results  Component Value Date   WBC 7.6 12/25/2022   HGB 7.3 (L) 12/25/2022   HCT 21.9 (L) 12/25/2022   MCV 90 12/25/2022   MCH 29.9 12/25/2022   RDW 23.4 (H) 12/25/2022   PLT 214 12/25/2022   Last metabolic panel Lab Results  Component Value Date   GLUCOSE 86 12/25/2022   NA 140 12/25/2022   K 5.5 (H) 12/25/2022   CL 110 (H) 12/25/2022   CO2 17 (L) 12/25/2022   BUN 20 12/25/2022   CREATININE 1.73 (H) 12/25/2022   GFRNONAA 40 (L) 09/14/2022   CALCIUM 9.2 12/25/2022   PROT 6.4 12/25/2022   ALBUMIN 4.3 12/25/2022   LABGLOB 2.1 12/25/2022   AGRATIO 2.0 12/25/2022   BILITOT 2.0 (H) 12/25/2022   ALKPHOS 234 (H) 12/25/2022   AST 36 12/25/2022   ALT 13 12/25/2022   ANIONGAP 7 09/14/2022   Last lipids Lab Results  Component Value Date   CHOL 127 03/01/2021   HDL 43 03/01/2021   LDLCALC 66 03/01/2021   TRIG 89  03/01/2021   CHOLHDL 3.0 03/01/2021   Last hemoglobin A1c Lab Results  Component Value Date   HGBA1C <4.2 (L) 03/01/2021   Last thyroid functions Lab Results  Component Value Date   TSH 1.730 03/01/2021   Last vitamin D Lab Results  Component Value Date   VD25OH 31.0 12/25/2022   Last vitamin B12 and Folate No results found for: "VITAMINB12", "FOLATE"    The ASCVD Risk score (Arnett DK, et al., 2019) failed to calculate for the following reasons:   The valid total cholesterol range is 130 to 320 mg/dL    Assessment & Plan:   Problem List Items Addressed This Visit       Other   Vitamin D deficiency   Relevant Orders   [id number] 11+Oxyco+Alc+Crt-Bund (Completed)   Sickle Cell Panel (Completed)   Sickle cell anemia - Primary   Relevant Orders   [id number] 11+Oxyco+Alc+Crt-Bund (Completed)   Sickle Cell Panel (Completed)   Chronic pain   Other Visit Diagnoses     Need for Tdap vaccination       Relevant Orders   Tdap vaccine greater than or equal to 7yo IM (Completed)      1. Sickle cell disease without crisis (HCC)  - [id number] 11+Oxyco+Alc+Crt-Bund - Sickle Cell Panel - Oxycodone/Oxymorphone, Confirm  2. Vitamin D deficiency  - Sickle Cell Panel  3. Chronic pain syndrome Reviewed PDMP substance reporting system prior to prescribing opiate medications. No inconsistencies noted.    4. Need for Tdap vaccination - Tdap vaccine greater than or equal to 7yo IM  Return in about 3 months (around 03/27/2023) for sickle cell anemia.      [name]  APRN, MSN, FNP-C Patient Care [facility] [address] [place], [place] [address] [phone]

## 2022-12-25 NOTE — Progress Notes (Signed)
8

## 2022-12-26 ENCOUNTER — Other Ambulatory Visit: Payer: Self-pay | Admitting: Family Medicine

## 2022-12-26 DIAGNOSIS — D571 Sickle-cell disease without crisis: Secondary | ICD-10-CM

## 2022-12-26 DIAGNOSIS — G894 Chronic pain syndrome: Secondary | ICD-10-CM

## 2022-12-26 LAB — CMP14+CBC/D/PLT+FER+RETIC+V...
ALT: 13 IU/L (ref 0–44)
AST: 36 IU/L (ref 0–40)
Albumin/Globulin Ratio: 2 (ref 1.2–2.2)
Albumin: 4.3 g/dL (ref 3.8–4.9)
Alkaline Phosphatase: 234 IU/L — ABNORMAL HIGH (ref 44–121)
BUN/Creatinine Ratio: 12 (ref 9–20)
BUN: 20 mg/dL (ref 6–24)
Basophils Absolute: 0 10*3/uL (ref 0.0–0.2)
Basos: 0 %
Bilirubin Total: 2 mg/dL — ABNORMAL HIGH (ref 0.0–1.2)
CO2: 17 mmol/L — ABNORMAL LOW (ref 20–29)
Calcium: 9.2 mg/dL (ref 8.7–10.2)
Chloride: 110 mmol/L — ABNORMAL HIGH (ref 96–106)
Creatinine, Ser: 1.73 mg/dL — ABNORMAL HIGH (ref 0.76–1.27)
EOS (ABSOLUTE): 0.2 10*3/uL (ref 0.0–0.4)
Eos: 3 %
Ferritin: 304 ng/mL (ref 30–400)
Globulin, Total: 2.1 g/dL (ref 1.5–4.5)
Glucose: 86 mg/dL (ref 70–99)
Hematocrit: 21.9 % — ABNORMAL LOW (ref 37.5–51.0)
Hemoglobin: 7.3 g/dL — ABNORMAL LOW (ref 13.0–17.7)
Immature Grans (Abs): 0 10*3/uL (ref 0.0–0.1)
Immature Granulocytes: 0 %
Lymphocytes Absolute: 2.5 10*3/uL (ref 0.7–3.1)
Lymphs: 33 %
MCH: 29.9 pg (ref 26.6–33.0)
MCHC: 33.3 g/dL (ref 31.5–35.7)
MCV: 90 fL (ref 79–97)
Monocytes Absolute: 0.9 10*3/uL (ref 0.1–0.9)
Monocytes: 12 %
NRBC: 11 % — ABNORMAL HIGH (ref 0–0)
Neutrophils Absolute: 3.9 10*3/uL (ref 1.4–7.0)
Neutrophils: 52 %
Platelets: 214 10*3/uL (ref 150–450)
Potassium: 5.5 mmol/L — ABNORMAL HIGH (ref 3.5–5.2)
RBC: 2.44 x10E6/uL — CL (ref 4.14–5.80)
RDW: 23.4 % — ABNORMAL HIGH (ref 11.6–15.4)
Retic Ct Pct: 10 % — ABNORMAL HIGH (ref 0.6–2.6)
Sodium: 140 mmol/L (ref 134–144)
Total Protein: 6.4 g/dL (ref 6.0–8.5)
Vit D, 25-Hydroxy: 31 ng/mL (ref 30.0–100.0)
WBC: 7.6 10*3/uL (ref 3.4–10.8)
eGFR: 46 mL/min/{1.73_m2} — ABNORMAL LOW (ref >59)

## 2022-12-26 LAB — DRUG SCREEN 764883 11+OXYCO+ALC+CRT-BUND

## 2022-12-26 MED ORDER — OXYCODONE-ACETAMINOPHEN 10-325 MG PO TABS
1.0000 | ORAL_TABLET | Freq: Four times a day (QID) | ORAL | 0 refills | Status: DC | PRN
  Filled 2022-12-29 (×2): qty 60, 15d supply, fill #0

## 2022-12-26 NOTE — Telephone Encounter (Signed)
Please advise KH 

## 2022-12-26 NOTE — Progress Notes (Signed)
Reviewed PDMP substance reporting system prior to prescribing opiate medications. No inconsistencies noted.  Meds ordered this encounter  Medications   oxyCODONE-acetaminophen (PERCOCET) 10-325 MG tablet    Sig: Take 1 tablet by mouth every 6 (six) hours as needed for pain.    Dispense:  60 tablet    Refill:  0    Order Specific Question:   Supervising Provider    Answer:   Tresa Garter [id number]   Donia Pounds  APRN, MSN, FNP-C Patient [place] [address] [place], [place] [address] [phone]

## 2022-12-27 ENCOUNTER — Other Ambulatory Visit (HOSPITAL_COMMUNITY): Payer: Self-pay

## 2022-12-29 ENCOUNTER — Other Ambulatory Visit (HOSPITAL_COMMUNITY): Payer: Self-pay

## 2023-01-03 ENCOUNTER — Telehealth: Payer: Self-pay | Admitting: Family Medicine

## 2023-01-03 NOTE — Telephone Encounter (Signed)
Called patient to schedule Medicare Annual Wellness Visit (AWV). No voicemail available to leave a message.  Last date of AWV: AWVI eligible as of 10/22/2009  Please schedule an AWVI appointment at any time with Annual Wellness Visit.  If any questions, please contact me at [phone].    Thank you,  [place] Direct dial  [phone]

## 2023-01-06 LAB — DRUG SCREEN 764883 11+OXYCO+ALC+CRT-BUND
Amphetamines, Urine: NEGATIVE ng/mL
BENZODIAZ UR QL: NEGATIVE ng/mL
Barbiturate: NEGATIVE ng/mL
Cannabinoid Quant, Ur: NEGATIVE ng/mL
Cocaine (Metabolite): NEGATIVE ng/mL
Creatinine: 106.8 mg/dL (ref 20.0–300.0)
Ethanol: NEGATIVE %
Meperidine: NEGATIVE ng/mL
Methadone Screen, Urine: NEGATIVE ng/mL
OPIATE SCREEN URINE: NEGATIVE ng/mL
Phencyclidine: NEGATIVE ng/mL
Propoxyphene: NEGATIVE ng/mL
Tramadol: NEGATIVE ng/mL
pH, Urine: 5.5 (ref 4.5–8.9)

## 2023-01-06 LAB — OXYCODONE/OXYMORPHONE, CONFIRM
OXYCODONE/OXYMORPH: POSITIVE — AB
OXYCODONE: NEGATIVE
OXYMORPHONE (GC/MS): 377 ng/mL
OXYMORPHONE: POSITIVE — AB

## 2023-01-08 ENCOUNTER — Other Ambulatory Visit: Payer: Self-pay | Admitting: Family Medicine

## 2023-01-08 DIAGNOSIS — D571 Sickle-cell disease without crisis: Secondary | ICD-10-CM

## 2023-01-08 DIAGNOSIS — G894 Chronic pain syndrome: Secondary | ICD-10-CM

## 2023-01-10 ENCOUNTER — Other Ambulatory Visit: Payer: Self-pay | Admitting: Family Medicine

## 2023-01-10 DIAGNOSIS — D571 Sickle-cell disease without crisis: Secondary | ICD-10-CM

## 2023-01-10 DIAGNOSIS — G894 Chronic pain syndrome: Secondary | ICD-10-CM

## 2023-01-10 NOTE — Telephone Encounter (Signed)
Please advise KH 

## 2023-01-12 ENCOUNTER — Other Ambulatory Visit (HOSPITAL_COMMUNITY): Payer: Self-pay

## 2023-01-12 ENCOUNTER — Other Ambulatory Visit: Payer: Self-pay | Admitting: Family Medicine

## 2023-01-12 DIAGNOSIS — D571 Sickle-cell disease without crisis: Secondary | ICD-10-CM

## 2023-01-12 DIAGNOSIS — G894 Chronic pain syndrome: Secondary | ICD-10-CM

## 2023-01-14 ENCOUNTER — Other Ambulatory Visit (HOSPITAL_COMMUNITY): Payer: Self-pay

## 2023-01-14 ENCOUNTER — Other Ambulatory Visit: Payer: Self-pay | Admitting: Family Medicine

## 2023-01-14 DIAGNOSIS — G894 Chronic pain syndrome: Secondary | ICD-10-CM

## 2023-01-14 DIAGNOSIS — D571 Sickle-cell disease without crisis: Secondary | ICD-10-CM

## 2023-01-14 MED ORDER — OXYCODONE-ACETAMINOPHEN 10-325 MG PO TABS
1.0000 | ORAL_TABLET | Freq: Four times a day (QID) | ORAL | 0 refills | Status: DC | PRN
  Filled 2023-01-14: qty 60, 15d supply, fill #0

## 2023-01-14 NOTE — Progress Notes (Signed)
Reviewed PDMP substance reporting system prior to prescribing opiate medications. No inconsistencies noted.   Meds ordered this encounter  Medications  . oxyCODONE-acetaminophen (PERCOCET) 10-325 MG tablet    Sig: Take 1 tablet by mouth every 6 (six) hours as needed for pain.    Dispense:  60 tablet    Refill:  0    Order Specific Question:   Supervising Provider    Answer:   JEGEDE, OLUGBEMIGA E [[id number]]     Scott Marbach Moore Lanier Millon  APRN, MSN, FNP-C Patient Care [facility] [address]  , [place] [address] [phone]  

## 2023-01-17 ENCOUNTER — Telehealth: Payer: Self-pay | Admitting: Family Medicine

## 2023-01-17 NOTE — Telephone Encounter (Signed)
Contacted Branten Felber to schedule their annual wellness visit. Appointment made for 01/24/2023.  Thank you,  [place] Direct dial  [phone]

## 2023-01-24 ENCOUNTER — Ambulatory Visit: Payer: 59

## 2023-01-24 ENCOUNTER — Telehealth: Payer: Self-pay

## 2023-01-24 NOTE — Telephone Encounter (Signed)
Contacted patient on preferred number listed in notes for scheduled AWV. Patient stated [employer] completed visit on 01/21/23.

## 2023-01-26 ENCOUNTER — Other Ambulatory Visit: Payer: Self-pay | Admitting: Family Medicine

## 2023-01-26 DIAGNOSIS — D571 Sickle-cell disease without crisis: Secondary | ICD-10-CM

## 2023-01-26 DIAGNOSIS — G894 Chronic pain syndrome: Secondary | ICD-10-CM

## 2023-01-28 ENCOUNTER — Other Ambulatory Visit: Payer: Self-pay | Admitting: Family Medicine

## 2023-01-28 DIAGNOSIS — G894 Chronic pain syndrome: Secondary | ICD-10-CM

## 2023-01-28 DIAGNOSIS — D571 Sickle-cell disease without crisis: Secondary | ICD-10-CM

## 2023-01-28 MED ORDER — OXYCODONE-ACETAMINOPHEN 10-325 MG PO TABS
1.0000 | ORAL_TABLET | Freq: Four times a day (QID) | ORAL | 0 refills | Status: DC | PRN
  Filled 2023-01-28: qty 60, 15d supply, fill #0

## 2023-01-28 NOTE — Telephone Encounter (Signed)
Please advise Kh 

## 2023-01-28 NOTE — Progress Notes (Signed)
Meds ordered this encounter  Medications  . oxyCODONE-acetaminophen (PERCOCET) 10-325 MG tablet    Sig: Take 1 tablet by mouth every 6 (six) hours as needed for pain.    Dispense:  60 tablet    Refill:  0    Order Specific Question:   Supervising Provider    Answer:   JEGEDE, OLUGBEMIGA E [[id number]]     Onofre Gains Moore Eowyn Tabone  APRN, MSN, FNP-C Patient Care [facility] [address]  [place], [place] [address] [phone]  

## 2023-01-29 ENCOUNTER — Other Ambulatory Visit (HOSPITAL_COMMUNITY): Payer: Self-pay

## 2023-02-05 ENCOUNTER — Encounter: Payer: Self-pay | Admitting: Family Medicine

## 2023-02-11 ENCOUNTER — Other Ambulatory Visit (HOSPITAL_COMMUNITY): Payer: Self-pay

## 2023-02-11 ENCOUNTER — Other Ambulatory Visit: Payer: Self-pay | Admitting: Family Medicine

## 2023-02-11 DIAGNOSIS — G894 Chronic pain syndrome: Secondary | ICD-10-CM

## 2023-02-11 DIAGNOSIS — D571 Sickle-cell disease without crisis: Secondary | ICD-10-CM

## 2023-02-11 MED ORDER — OXYCODONE-ACETAMINOPHEN 10-325 MG PO TABS
1.0000 | ORAL_TABLET | Freq: Four times a day (QID) | ORAL | 0 refills | Status: DC | PRN
  Filled 2023-02-12 (×2): qty 60, 15d supply, fill #0

## 2023-02-11 MED ORDER — IBUPROFEN 800 MG PO TABS
800.0000 mg | ORAL_TABLET | Freq: Three times a day (TID) | ORAL | 1 refills | Status: DC | PRN
  Filled 2023-02-11: qty 60, 20d supply, fill #0
  Filled 2023-04-16: qty 60, 20d supply, fill #1

## 2023-02-11 NOTE — Telephone Encounter (Signed)
Please advise KH 

## 2023-02-11 NOTE — Progress Notes (Signed)
Reviewed PDMP substance reporting system prior to prescribing opiate medications. No inconsistencies noted.  Meds ordered this encounter  Medications   oxyCODONE-acetaminophen (PERCOCET) 10-325 MG tablet    Sig: Take 1 tablet by mouth every 6 hours as needed for pain.    Dispense:  60 tablet    Refill:  0    Order Specific Question:   Supervising Provider    Answer:   JEGEDE, OLUGBEMIGA E [[id number]]   Cherish Runde Moore Erricka Falkner  APRN, MSN, FNP-C Patient Care Center [facility] [address]  [place], [place] [address] [phone]  

## 2023-02-12 ENCOUNTER — Other Ambulatory Visit (HOSPITAL_COMMUNITY): Payer: Self-pay

## 2023-02-12 ENCOUNTER — Other Ambulatory Visit: Payer: Self-pay

## 2023-02-20 ENCOUNTER — Other Ambulatory Visit: Payer: Self-pay

## 2023-02-25 ENCOUNTER — Other Ambulatory Visit (HOSPITAL_COMMUNITY): Payer: Self-pay

## 2023-02-25 ENCOUNTER — Other Ambulatory Visit: Payer: Self-pay | Admitting: Family Medicine

## 2023-02-25 DIAGNOSIS — G894 Chronic pain syndrome: Secondary | ICD-10-CM

## 2023-02-25 DIAGNOSIS — D571 Sickle-cell disease without crisis: Secondary | ICD-10-CM

## 2023-02-25 MED ORDER — OXYCODONE-ACETAMINOPHEN 10-325 MG PO TABS
1.0000 | ORAL_TABLET | Freq: Four times a day (QID) | ORAL | 0 refills | Status: DC | PRN
  Filled 2023-02-25: qty 60, 15d supply, fill #0

## 2023-02-25 NOTE — Telephone Encounter (Signed)
Please advise KH 

## 2023-02-25 NOTE — Progress Notes (Signed)
Reviewed PDMP substance reporting system prior to prescribing opiate medications. No inconsistencies noted.  Meds ordered this encounter  Medications   oxyCODONE-acetaminophen (PERCOCET) 10-325 MG tablet    Sig: Take 1 tablet by mouth every 6 hours as needed for pain.    Dispense:  60 tablet    Refill:  0    Order Specific Question:   Supervising Provider    Answer:   JEGEDE, OLUGBEMIGA E [[id number]]   Lindsay Soulliere Moore Hikeem Andersson  APRN, MSN, FNP-C Patient Care [facility] [address]  [place], [place] [address] [phone]  

## 2023-03-10 ENCOUNTER — Other Ambulatory Visit: Payer: Self-pay | Admitting: Family Medicine

## 2023-03-10 DIAGNOSIS — G894 Chronic pain syndrome: Secondary | ICD-10-CM

## 2023-03-10 DIAGNOSIS — D571 Sickle-cell disease without crisis: Secondary | ICD-10-CM

## 2023-03-12 ENCOUNTER — Other Ambulatory Visit: Payer: Self-pay | Admitting: Nurse Practitioner

## 2023-03-12 ENCOUNTER — Other Ambulatory Visit (HOSPITAL_COMMUNITY): Payer: Self-pay

## 2023-03-12 DIAGNOSIS — D571 Sickle-cell disease without crisis: Secondary | ICD-10-CM

## 2023-03-12 DIAGNOSIS — G894 Chronic pain syndrome: Secondary | ICD-10-CM

## 2023-03-12 MED ORDER — OXYCODONE-ACETAMINOPHEN 10-325 MG PO TABS
1.0000 | ORAL_TABLET | Freq: Four times a day (QID) | ORAL | 0 refills | Status: DC | PRN
  Filled 2023-03-12: qty 60, 15d supply, fill #0

## 2023-03-26 ENCOUNTER — Encounter: Payer: Self-pay | Admitting: Family Medicine

## 2023-03-26 ENCOUNTER — Other Ambulatory Visit: Payer: Self-pay | Admitting: Nurse Practitioner

## 2023-03-26 ENCOUNTER — Ambulatory Visit (INDEPENDENT_AMBULATORY_CARE_PROVIDER_SITE_OTHER): Payer: 59 | Admitting: Family Medicine

## 2023-03-26 ENCOUNTER — Other Ambulatory Visit (HOSPITAL_COMMUNITY): Payer: Self-pay

## 2023-03-26 VITALS — BP 126/66 | HR 72 | Temp 97.9°F | Wt 165.6 lb

## 2023-03-26 DIAGNOSIS — D571 Sickle-cell disease without crisis: Secondary | ICD-10-CM

## 2023-03-26 DIAGNOSIS — G894 Chronic pain syndrome: Secondary | ICD-10-CM

## 2023-03-26 DIAGNOSIS — Z1159 Encounter for screening for other viral diseases: Secondary | ICD-10-CM | POA: Diagnosis not present

## 2023-03-26 DIAGNOSIS — E559 Vitamin D deficiency, unspecified: Secondary | ICD-10-CM | POA: Diagnosis not present

## 2023-03-26 MED ORDER — OXYCODONE-ACETAMINOPHEN 10-325 MG PO TABS
1.0000 | ORAL_TABLET | Freq: Four times a day (QID) | ORAL | 0 refills | Status: DC | PRN
  Filled 2023-03-27 (×2): qty 60, 15d supply, fill #0

## 2023-03-26 MED ORDER — FOLIC ACID 1 MG PO TABS
1.0000 mg | ORAL_TABLET | Freq: Every day | ORAL | 11 refills | Status: DC
Start: 2023-03-26 — End: 2023-09-24
  Filled 2023-03-26 – 2023-04-08 (×2): qty 30, 30d supply, fill #0

## 2023-03-26 NOTE — Telephone Encounter (Signed)
Please advise KH 

## 2023-03-26 NOTE — Progress Notes (Signed)
Established Patient Office Visit  Subjective   Patient ID: Scott Norton, male    DOB: 05-15-1966  Age: 57 y.o. MRN: [id number]  Chief Complaint  Patient presents with   Sickle Cell Anemia    Scott Norton is a 57 year old male with a medical history significant for sickle cell disease, chronic pain syndrome, opiate dependence and tolerance, and history of anemia of chronic disease that presents for follow-up of sickle cell disease and medication management.  Patient states that he has been feeling well and is without complaint.  He has chronic pain there is primarily to low back and lower extremities.  Patient's pain is well-controlled on Percocet 10-3 25.  Patient has been taking his medication consistently for pain control.  Today, patient's pain intensity is 7/10 to his low back.  He currently denies any fatigue, dizziness, chest pain, or shortness of breath.  No urinary symptoms, nausea, vomiting, or diarrhea.    Patient Active Problem List   Diagnosis Date Noted   Weakness generalized 09/13/2022   Malnutrition of moderate degree 09/12/2022   Hemolytic crisis 09/12/2022   Acute hypoxemic respiratory failure (HCC) 06/10/2021   Sickle cell anemia with crisis (HCC) 06/10/2021   Suicidal ideation    Acute exacerbation of CHF (congestive heart failure) (HCC) 03/13/2021   Depression 03/01/2021   Cocaine abuse with cocaine-induced mood disorder (HCC) 03/01/2021   Sickle-cell crisis (HCC) 02/08/2021   Hyperkalemia 02/08/2021   Thrombocytopenia (HCC) 02/08/2021   Leukocytosis 02/08/2021   AKI (acute kidney injury) (HCC) 02/08/2021   Cocaine abuse (HCC) 02/08/2021   Homelessness 09/14/2020   Suicidal ideations 09/04/2020   Tobacco use 09/04/2020   Sickle cell pain crisis (HCC) 04/11/2015   Vitamin D deficiency 01/31/2015   Chronic pain 06/30/2014   Respiratory infection 08/13/2012   Healthcare maintenance 06/04/2012   Cervical strain, acute 12/09/2011   Neck sprain and strain  12/09/2011   Sickle cell anemia (HCC) 11/29/2011   Seasonal allergies 11/29/2011   Seasonal allergic rhinitis 11/29/2011   Past Medical History:  Diagnosis Date   AKI (acute kidney injury) (HCC) 02/08/2021   Anemia    Depression    Heart murmur    Seasonal allergies    wool & grass   Sickle cell anemia (HCC)    Past Surgical History:  Procedure Laterality Date   NO PAST SURGERIES     ROOT CANAL     Social History   Tobacco Use   Smoking status: Some Days    Packs/day: .5    Types: Cigars, Cigarettes   Smokeless tobacco: Never   Tobacco comments:    10 cigars per week.  Vaping Use   Vaping Use: Never used  Substance Use Topics   Alcohol use: Not Currently   Drug use: Not Currently    Types: Marijuana, Cocaine   Social History   Socioeconomic History   Marital status: Single    Spouse name: Not on file   Number of children: Not on file   Years of education: Not on file   Highest education level: Not on file  Occupational History   Not on file  Tobacco Use   Smoking status: Some Days    Packs/day: .5    Types: Cigars, Cigarettes   Smokeless tobacco: Never   Tobacco comments:    10 cigars per week.  Vaping Use   Vaping Use: Never used  Substance and Sexual Activity   Alcohol use: Not Currently   Drug use: Not Currently  Types: Marijuana, Cocaine   Sexual activity: Yes  Other Topics Concern   Not on file  Social History Narrative   Lives with Friends in [place]. Working on housing for himself   On disability for his SS disease      No children, no stable relationship   Social Determinants of Corporate investment banker Strain: Not on file  Food Insecurity: Food Insecurity Present (09/10/2022)   Hunger Vital Sign    Worried About Running Out of Food in the Last Year: Often true    Ran Out of Food in the Last Year: Often true  Transportation Needs: No Transportation Needs (09/10/2022)   PRAPARE - Administrator, Civil Service (Medical): No     Lack of Transportation (Non-Medical): No  Physical Activity: Not on file  Stress: Not on file  Social Connections: Not on file  Intimate Partner Violence: Not At Risk (09/10/2022)   Humiliation, Afraid, Rape, and Kick questionnaire    Fear of Current or Ex-Partner: No    Emotionally Abused: No    Physically Abused: No    Sexually Abused: No   Family Status  Relation Name Status   Mother  Alive   Father  Deceased   Sister  (Not Specified)   Brother  (Not Specified)   MGM  (Not Specified)   MGF  (Not Specified)   PGM  (Not Specified)   PGF  (Not Specified)   Family History  Problem Relation Age of Onset   Alcohol abuse Mother    Sickle cell trait Mother    Hypertension Mother    Alcohol abuse Father    Sickle cell trait Father    Early death Father        due to alcoholism   Hypertension Father    Hypertension Sister    Hypertension Brother    Hypertension Maternal Grandmother    Hypertension Maternal Grandfather    Hypertension Paternal Grandmother    Hypertension Paternal Grandfather    Allergies  Allergen Reactions   Aspirin Other (See Comments)    Increased heart rate   Tape Other (See Comments)    rash   Tramadol Hives and Itching      Review of Systems  Constitutional: Negative.   HENT: Negative.    Gastrointestinal: Negative.   Genitourinary: Negative.   Musculoskeletal:  Positive for back pain and joint pain.  Skin: Negative.   Endo/Heme/Allergies: Negative.   Psychiatric/Behavioral: Negative.        Objective:     BP 126/66   Pulse 72   Temp 97.9 F (36.6 C)   Wt 165 lb 9.6 oz (75.1 kg)   BMI 20.70 kg/m  BP Readings from Last 3 Encounters:  03/26/23 126/66  12/25/22 (!) 108/49  09/18/22 121/65   Wt Readings from Last 3 Encounters:  03/26/23 165 lb 9.6 oz (75.1 kg)  12/25/22 177 lb (80.3 kg)  09/18/22 161 lb (73 kg)      Physical Exam Constitutional:      Appearance: Normal appearance.  Eyes:     Pupils: Pupils are  equal, round, and reactive to light.  Cardiovascular:     Rate and Rhythm: Normal rate and regular rhythm.     Pulses: Normal pulses.  Pulmonary:     Effort: Pulmonary effort is normal.  Abdominal:     General: Bowel sounds are normal.  Musculoskeletal:        General: Normal range of motion.  Skin:  General: Skin is warm.  Neurological:     General: No focal deficit present.     Mental Status: He is alert. Mental status is at baseline.  Psychiatric:        Mood and Affect: Mood normal.        Behavior: Behavior normal.        Thought Content: Thought content normal.        Judgment: Judgment normal.      No results found for any visits on 03/26/23.  Last CBC Lab Results  Component Value Date   WBC 7.6 12/25/2022   HGB 7.3 (L) 12/25/2022   HCT 21.9 (L) 12/25/2022   MCV 90 12/25/2022   MCH 29.9 12/25/2022   RDW 23.4 (H) 12/25/2022   PLT 214 12/25/2022   Last metabolic panel Lab Results  Component Value Date   GLUCOSE 86 12/25/2022   NA 140 12/25/2022   K 5.5 (H) 12/25/2022   CL 110 (H) 12/25/2022   CO2 17 (L) 12/25/2022   BUN 20 12/25/2022   CREATININE 1.73 (H) 12/25/2022   EGFR 46 (L) 12/25/2022   CALCIUM 9.2 12/25/2022   PROT 6.4 12/25/2022   ALBUMIN 4.3 12/25/2022   LABGLOB 2.1 12/25/2022   AGRATIO 2.0 12/25/2022   BILITOT 2.0 (H) 12/25/2022   ALKPHOS 234 (H) 12/25/2022   AST 36 12/25/2022   ALT 13 12/25/2022   ANIONGAP 7 09/14/2022   Last lipids Lab Results  Component Value Date   CHOL 127 03/01/2021   HDL 43 03/01/2021   LDLCALC 66 03/01/2021   TRIG 89 03/01/2021   CHOLHDL 3.0 03/01/2021   Last hemoglobin A1c Lab Results  Component Value Date   HGBA1C <4.2 (L) 03/01/2021   Last thyroid functions Lab Results  Component Value Date   TSH 1.730 03/01/2021   Last vitamin D Lab Results  Component Value Date   VD25OH 31.0 12/25/2022   Last vitamin B12 and Folate No results found for: "VITAMINB12", "FOLATE"    The ASCVD Risk score  (Arnett DK, et al., 2019) failed to calculate for the following reasons:   The valid total cholesterol range is 130 to 320 mg/dL    Assessment & Plan:   Problem List Items Addressed This Visit       Other   Vitamin D deficiency - Primary   Relevant Orders   [id number] 11+Oxyco+Alc+Crt-Bund   Sickle Cell Panel   Sickle cell anemia (HCC)   Relevant Medications   oxyCODONE-acetaminophen (PERCOCET) 10-325 MG tablet (Start on 03/27/2023)   folic acid (FOLVITE) 1 MG tablet   Chronic pain   Relevant Medications   oxyCODONE-acetaminophen (PERCOCET) 10-325 MG tablet (Start on 03/27/2023)   Other Relevant Orders   [id number] 11+Oxyco+Alc+Crt-Bund   Sickle Cell Panel   Other Visit Diagnoses     Need for hepatitis C screening test       Relevant Orders   Hepatitis C Antibody       No follow-ups on file.    Julianne Handler, FNP

## 2023-03-27 ENCOUNTER — Other Ambulatory Visit: Payer: Self-pay | Admitting: Family Medicine

## 2023-03-27 ENCOUNTER — Other Ambulatory Visit (HOSPITAL_COMMUNITY): Payer: Self-pay

## 2023-03-27 ENCOUNTER — Other Ambulatory Visit: Payer: Self-pay

## 2023-03-27 DIAGNOSIS — D571 Sickle-cell disease without crisis: Secondary | ICD-10-CM

## 2023-03-27 LAB — CMP14+CBC/D/PLT+FER+RETIC+V...
ALT: 23 IU/L (ref 0–44)
AST: 31 IU/L (ref 0–40)
Albumin/Globulin Ratio: 1.8 (ref 1.2–2.2)
Albumin: 4.2 g/dL (ref 3.8–4.9)
Alkaline Phosphatase: 103 IU/L (ref 44–121)
BUN/Creatinine Ratio: 16 (ref 9–20)
BUN: 12 mg/dL (ref 6–24)
Basophils Absolute: 0.1 10*3/uL (ref 0.0–0.2)
Basos: 1 %
Bilirubin Total: 0.6 mg/dL (ref 0.0–1.2)
CO2: 28 mmol/L (ref 20–29)
Calcium: 9.8 mg/dL (ref 8.7–10.2)
Chloride: 97 mmol/L (ref 96–106)
Creatinine, Ser: 0.77 mg/dL (ref 0.76–1.27)
EOS (ABSOLUTE): 0.4 10*3/uL (ref 0.0–0.4)
Eos: 4 %
Ferritin: 29 ng/mL — ABNORMAL LOW (ref 30–400)
Globulin, Total: 2.3 g/dL (ref 1.5–4.5)
Glucose: 91 mg/dL (ref 70–99)
Hematocrit: 20.1 % — ABNORMAL LOW (ref 37.5–51.0)
Hemoglobin: 6.3 g/dL — CL (ref 13.0–17.7)
Immature Grans (Abs): 0.1 10*3/uL (ref 0.0–0.1)
Immature Granulocytes: 1 %
Lymphocytes Absolute: 3 10*3/uL (ref 0.7–3.1)
Lymphs: 34 %
MCH: 29.9 pg (ref 26.6–33.0)
MCHC: 31.3 g/dL — ABNORMAL LOW (ref 31.5–35.7)
MCV: 95 fL (ref 79–97)
Monocytes Absolute: 1.1 10*3/uL — ABNORMAL HIGH (ref 0.1–0.9)
Monocytes: 12 %
NRBC: 14 % — ABNORMAL HIGH (ref 0–0)
Neutrophils Absolute: 4.2 10*3/uL (ref 1.4–7.0)
Neutrophils: 48 %
Platelets: 204 10*3/uL (ref 150–450)
Potassium: 3 mmol/L — ABNORMAL LOW (ref 3.5–5.2)
RBC: 2.11 x10E6/uL — CL (ref 4.14–5.80)
RDW: 24.5 % — ABNORMAL HIGH (ref 11.6–15.4)
Retic Ct Pct: 10.5 % — ABNORMAL HIGH (ref 0.6–2.6)
Sodium: 140 mmol/L (ref 134–144)
Total Protein: 6.5 g/dL (ref 6.0–8.5)
Vit D, 25-Hydroxy: 27.7 ng/mL — ABNORMAL LOW (ref 30.0–100.0)
WBC: 8.7 10*3/uL (ref 3.4–10.8)
eGFR: 105 mL/min/{1.73_m2} (ref >59)

## 2023-03-27 LAB — HEPATITIS C ANTIBODY: Hep C Virus Ab: NONREACTIVE

## 2023-03-27 NOTE — Progress Notes (Signed)
Scott Norton is a 57 year old male with a medical history significant for sickle cell disease, chronic pain syndrome, opiate dependence and tolerance, and anemia of chronic disease that was evaluated in clinic on 03/26/2023.  Laboratory values reviewed, hemoglobin decreased below baseline at 6.3 g/dL.  In addition, potassium decreased at 3.0.  Please schedule appointment in day hospital on 03/28/2023 for type and screen and possible blood transfusion.  Orders have been placed and nurses will release. [name]  APRN, MSN, FNP-C Patient Care [facility] [address] [place], [place] [address] [phone]

## 2023-03-28 LAB — DRUG SCREEN 764883 11+OXYCO+ALC+CRT-BUND
Amphetamines, Urine: NEGATIVE ng/mL
BENZODIAZ UR QL: NEGATIVE ng/mL
Barbiturate: NEGATIVE ng/mL
Cannabinoid Quant, Ur: NEGATIVE ng/mL
Cocaine (Metabolite): NEGATIVE ng/mL
Creatinine: 106.3 mg/dL (ref 20.0–300.0)
Ethanol: NEGATIVE %
Meperidine: NEGATIVE ng/mL
Methadone Screen, Urine: NEGATIVE ng/mL
OPIATE SCREEN URINE: NEGATIVE ng/mL
Phencyclidine: NEGATIVE ng/mL
Propoxyphene: NEGATIVE ng/mL
Tramadol: NEGATIVE ng/mL
pH, Urine: 5.8 (ref 4.5–8.9)

## 2023-03-28 LAB — OXYCODONE/OXYMORPHONE, CONFIRM
OXYCODONE/OXYMORPH: POSITIVE — AB
OXYCODONE: NEGATIVE
OXYMORPHONE (GC/MS): 421 ng/mL
OXYMORPHONE: POSITIVE — AB

## 2023-04-03 ENCOUNTER — Other Ambulatory Visit (HOSPITAL_COMMUNITY): Payer: Self-pay

## 2023-04-08 ENCOUNTER — Other Ambulatory Visit (HOSPITAL_COMMUNITY): Payer: Self-pay

## 2023-04-08 ENCOUNTER — Other Ambulatory Visit: Payer: Self-pay | Admitting: Family Medicine

## 2023-04-08 DIAGNOSIS — D571 Sickle-cell disease without crisis: Secondary | ICD-10-CM

## 2023-04-08 DIAGNOSIS — G894 Chronic pain syndrome: Secondary | ICD-10-CM

## 2023-04-08 MED ORDER — OXYCODONE-ACETAMINOPHEN 10-325 MG PO TABS
1.0000 | ORAL_TABLET | Freq: Four times a day (QID) | ORAL | 0 refills | Status: DC | PRN
Start: 2023-04-11 — End: 2023-04-22
  Filled 2023-04-11: qty 60, 15d supply, fill #0

## 2023-04-08 NOTE — Progress Notes (Signed)
Reviewed PDMP substance reporting system prior to prescribing opiate medications. No inconsistencies noted.  Meds ordered this encounter  Medications   oxyCODONE-acetaminophen (PERCOCET) 10-325 MG tablet    Sig: Take 1 tablet by mouth every 6 hours as needed for pain.    Dispense:  60 tablet    Refill:  0    Order Specific Question:   Supervising Provider    Answer:   JEGEDE, OLUGBEMIGA E [[id number]]   Drue Harr Moore Alaze Garverick  APRN, MSN, FNP-C [facility] [address]  , [place] [address] [phone]  

## 2023-04-11 ENCOUNTER — Other Ambulatory Visit (HOSPITAL_COMMUNITY): Payer: Self-pay

## 2023-04-16 ENCOUNTER — Other Ambulatory Visit (HOSPITAL_COMMUNITY): Payer: Self-pay

## 2023-04-21 ENCOUNTER — Other Ambulatory Visit: Payer: Self-pay | Admitting: Family Medicine

## 2023-04-21 DIAGNOSIS — G894 Chronic pain syndrome: Secondary | ICD-10-CM

## 2023-04-21 DIAGNOSIS — D571 Sickle-cell disease without crisis: Secondary | ICD-10-CM

## 2023-04-22 ENCOUNTER — Other Ambulatory Visit: Payer: Self-pay | Admitting: Family Medicine

## 2023-04-22 ENCOUNTER — Other Ambulatory Visit (HOSPITAL_COMMUNITY): Payer: Self-pay

## 2023-04-22 DIAGNOSIS — G894 Chronic pain syndrome: Secondary | ICD-10-CM

## 2023-04-22 DIAGNOSIS — D571 Sickle-cell disease without crisis: Secondary | ICD-10-CM

## 2023-04-22 MED ORDER — OXYCODONE-ACETAMINOPHEN 10-325 MG PO TABS
1.0000 | ORAL_TABLET | Freq: Four times a day (QID) | ORAL | 0 refills | Status: DC | PRN
Start: 2023-04-26 — End: 2023-05-06
  Filled 2023-04-26: qty 60, 15d supply, fill #0

## 2023-04-22 NOTE — Progress Notes (Signed)
Reviewed PDMP substance reporting system prior to prescribing opiate medications. No inconsistencies noted.  Meds ordered this encounter  Medications   oxyCODONE-acetaminophen (PERCOCET) 10-325 MG tablet    Sig: Take 1 tablet by mouth every 6 hours as needed for pain.    Dispense:  60 tablet    Refill:  0    Order Specific Question:   Supervising Provider    Answer:   JEGEDE, OLUGBEMIGA E [[id number]]   Scott Stein Moore Brailee Riede  APRN, MSN, FNP-C [facility] [address]  [place], [place] [address] [phone]  

## 2023-04-26 ENCOUNTER — Other Ambulatory Visit (HOSPITAL_COMMUNITY): Payer: Self-pay

## 2023-05-06 ENCOUNTER — Other Ambulatory Visit: Payer: Self-pay | Admitting: Family Medicine

## 2023-05-06 ENCOUNTER — Other Ambulatory Visit (HOSPITAL_COMMUNITY): Payer: Self-pay

## 2023-05-06 DIAGNOSIS — D571 Sickle-cell disease without crisis: Secondary | ICD-10-CM

## 2023-05-06 DIAGNOSIS — G894 Chronic pain syndrome: Secondary | ICD-10-CM

## 2023-05-06 MED ORDER — IBUPROFEN 800 MG PO TABS
800.0000 mg | ORAL_TABLET | Freq: Three times a day (TID) | ORAL | 1 refills | Status: DC | PRN
Start: 2023-05-06 — End: 2023-07-16
  Filled 2023-05-06: qty 60, 20d supply, fill #0
  Filled 2023-06-25: qty 60, 20d supply, fill #1

## 2023-05-06 MED ORDER — OXYCODONE-ACETAMINOPHEN 10-325 MG PO TABS
1.0000 | ORAL_TABLET | Freq: Four times a day (QID) | ORAL | 0 refills | Status: DC | PRN
Start: 2023-05-11 — End: 2023-05-22
  Filled 2023-05-11: qty 60, 15d supply, fill #0

## 2023-05-06 NOTE — Progress Notes (Signed)
Reviewed PDMP substance reporting system prior to prescribing opiate medications. No inconsistencies noted.  Meds ordered this encounter  Medications   oxyCODONE-acetaminophen (PERCOCET) 10-325 MG tablet    Sig: Take 1 tablet by mouth every 6 hours as needed for pain.    Dispense:  60 tablet    Refill:  0    Order Specific Question:   Supervising Provider    Answer:   JEGEDE, OLUGBEMIGA E [[id number]]   Scott Moore Hollis  APRN, MSN, FNP-C [facility] [address]  [place], [place] [address] [phone]  

## 2023-05-06 NOTE — Telephone Encounter (Signed)
Please advise Kh 

## 2023-05-11 ENCOUNTER — Other Ambulatory Visit (HOSPITAL_COMMUNITY): Payer: Self-pay

## 2023-05-21 ENCOUNTER — Other Ambulatory Visit: Payer: Self-pay | Admitting: Family Medicine

## 2023-05-21 DIAGNOSIS — D571 Sickle-cell disease without crisis: Secondary | ICD-10-CM

## 2023-05-21 DIAGNOSIS — G894 Chronic pain syndrome: Secondary | ICD-10-CM

## 2023-05-22 ENCOUNTER — Other Ambulatory Visit (HOSPITAL_COMMUNITY): Payer: Self-pay

## 2023-05-22 ENCOUNTER — Other Ambulatory Visit: Payer: Self-pay | Admitting: Nurse Practitioner

## 2023-05-22 DIAGNOSIS — D571 Sickle-cell disease without crisis: Secondary | ICD-10-CM

## 2023-05-22 DIAGNOSIS — G894 Chronic pain syndrome: Secondary | ICD-10-CM

## 2023-05-22 MED ORDER — OXYCODONE-ACETAMINOPHEN 10-325 MG PO TABS
1.0000 | ORAL_TABLET | Freq: Four times a day (QID) | ORAL | 0 refills | Status: DC | PRN
Start: 2023-05-26 — End: 2023-06-14
  Filled 2023-05-27: qty 60, 15d supply, fill #0

## 2023-05-22 NOTE — Telephone Encounter (Signed)
Please advise Kh 

## 2023-05-23 ENCOUNTER — Other Ambulatory Visit (HOSPITAL_COMMUNITY): Payer: Self-pay

## 2023-05-27 ENCOUNTER — Other Ambulatory Visit (HOSPITAL_COMMUNITY): Payer: Self-pay

## 2023-06-07 ENCOUNTER — Other Ambulatory Visit: Payer: Self-pay | Admitting: Nurse Practitioner

## 2023-06-07 DIAGNOSIS — G894 Chronic pain syndrome: Secondary | ICD-10-CM

## 2023-06-07 DIAGNOSIS — D571 Sickle-cell disease without crisis: Secondary | ICD-10-CM

## 2023-06-11 ENCOUNTER — Other Ambulatory Visit: Payer: Self-pay | Admitting: Nurse Practitioner

## 2023-06-11 ENCOUNTER — Other Ambulatory Visit (HOSPITAL_COMMUNITY): Payer: Self-pay

## 2023-06-11 DIAGNOSIS — D571 Sickle-cell disease without crisis: Secondary | ICD-10-CM

## 2023-06-11 DIAGNOSIS — G894 Chronic pain syndrome: Secondary | ICD-10-CM

## 2023-06-12 ENCOUNTER — Other Ambulatory Visit: Payer: Self-pay

## 2023-06-12 ENCOUNTER — Other Ambulatory Visit (HOSPITAL_COMMUNITY): Payer: Self-pay

## 2023-06-12 ENCOUNTER — Other Ambulatory Visit: Payer: Self-pay | Admitting: Family Medicine

## 2023-06-12 DIAGNOSIS — G894 Chronic pain syndrome: Secondary | ICD-10-CM

## 2023-06-12 DIAGNOSIS — D571 Sickle-cell disease without crisis: Secondary | ICD-10-CM

## 2023-06-13 ENCOUNTER — Other Ambulatory Visit (HOSPITAL_COMMUNITY): Payer: Self-pay

## 2023-06-13 NOTE — Telephone Encounter (Signed)
Caller & Relationship to patient:  MRN #  [id number]   Call Back Number:   Date of Last Office Visit: 06/11/2023     Date of Next Office Visit: 06/25/2023    Medication(s) to be Refilled: Oxycodone   Preferred Pharmacy:   ** Please notify patient to allow 48-72 hours to process** **Let patient know to contact pharmacy at the end of the day to make sure medication is ready. ** **If patient has not been seen in a year or longer, book an appointment **Advise to use MyChart for refill requests OR to contact their pharmacy

## 2023-06-14 ENCOUNTER — Other Ambulatory Visit: Payer: Self-pay | Admitting: Internal Medicine

## 2023-06-14 ENCOUNTER — Other Ambulatory Visit: Payer: Self-pay | Admitting: Family Medicine

## 2023-06-14 ENCOUNTER — Telehealth: Payer: Self-pay | Admitting: Family Medicine

## 2023-06-14 ENCOUNTER — Other Ambulatory Visit (HOSPITAL_COMMUNITY): Payer: Self-pay

## 2023-06-14 DIAGNOSIS — D571 Sickle-cell disease without crisis: Secondary | ICD-10-CM

## 2023-06-14 DIAGNOSIS — G894 Chronic pain syndrome: Secondary | ICD-10-CM

## 2023-06-14 MED ORDER — OXYCODONE-ACETAMINOPHEN 10-325 MG PO TABS
1.0000 | ORAL_TABLET | Freq: Four times a day (QID) | ORAL | 0 refills | Status: DC | PRN
Start: 2023-06-14 — End: 2023-06-25
  Filled 2023-06-14: qty 60, 15d supply, fill #0

## 2023-06-14 NOTE — Telephone Encounter (Signed)
Oxycodone refill request.

## 2023-06-25 ENCOUNTER — Ambulatory Visit (INDEPENDENT_AMBULATORY_CARE_PROVIDER_SITE_OTHER): Payer: 59 | Admitting: Family Medicine

## 2023-06-25 ENCOUNTER — Other Ambulatory Visit: Payer: Self-pay | Admitting: Internal Medicine

## 2023-06-25 ENCOUNTER — Encounter: Payer: Self-pay | Admitting: Family Medicine

## 2023-06-25 VITALS — BP 120/41 | HR 87 | Temp 97.1°F | Wt 162.0 lb

## 2023-06-25 DIAGNOSIS — G894 Chronic pain syndrome: Secondary | ICD-10-CM

## 2023-06-25 DIAGNOSIS — Z1211 Encounter for screening for malignant neoplasm of colon: Secondary | ICD-10-CM | POA: Diagnosis not present

## 2023-06-25 DIAGNOSIS — E559 Vitamin D deficiency, unspecified: Secondary | ICD-10-CM | POA: Diagnosis not present

## 2023-06-25 DIAGNOSIS — D571 Sickle-cell disease without crisis: Secondary | ICD-10-CM

## 2023-06-25 MED ORDER — OXYCODONE-ACETAMINOPHEN 10-325 MG PO TABS
1.0000 | ORAL_TABLET | Freq: Four times a day (QID) | ORAL | 0 refills | Status: DC | PRN
Start: 2023-06-29 — End: 2023-07-11
  Filled 2023-06-29: qty 60, 15d supply, fill #0

## 2023-06-25 NOTE — Progress Notes (Signed)
Established Patient Office Visit  Subjective   Patient ID: Scott Norton, male    DOB: 1965/12/21  Age: 57 y.o. MRN: [id number]  Chief Complaint  Patient presents with   Sickle Cell Anemia    Scott Norton is a 57 year old male with a medical history significant for sickle cell disease, chronic pain syndrome, opiate dependence and tolerance, and anemia of chronic disease that presents for a 66-month follow-up of chronic conditions and medication management.  Patient says that he has been doing very well and is without complaint on today.  Patient has a long history of chronic pain primarily to his low back.  Pain has been very well-controlled on Percocet around every 6 hours.  Patient states that he last had this medication on yesterday.  He rates his current pain as 5/10.  He denies any headache, chest pain, fatigue, dizziness, urinary symptoms, nausea, vomiting, or diarrhea.    Patient Active Problem List   Diagnosis Date Noted   Weakness generalized 09/13/2022   Malnutrition of moderate degree 09/12/2022   Hemolytic crisis 09/12/2022   Acute hypoxemic respiratory failure (HCC) 06/10/2021   Sickle cell anemia with crisis (HCC) 06/10/2021   Suicidal ideation    Acute exacerbation of CHF (congestive heart failure) (HCC) 03/13/2021   Depression 03/01/2021   Cocaine abuse with cocaine-induced mood disorder (HCC) 03/01/2021   Sickle-cell crisis (HCC) 02/08/2021   Hyperkalemia 02/08/2021   Thrombocytopenia (HCC) 02/08/2021   Leukocytosis 02/08/2021   AKI (acute kidney injury) (HCC) 02/08/2021   Cocaine abuse (HCC) 02/08/2021   Homelessness 09/14/2020   Suicidal ideations 09/04/2020   Tobacco use 09/04/2020   Sickle cell pain crisis (HCC) 04/11/2015   Vitamin D deficiency 01/31/2015   Chronic pain 06/30/2014   Respiratory infection 08/13/2012   Healthcare maintenance 06/04/2012   Cervical strain, acute 12/09/2011   Neck sprain and strain 12/09/2011   Sickle cell anemia (HCC)  11/29/2011   Seasonal allergies 11/29/2011   Seasonal allergic rhinitis 11/29/2011   Past Medical History:  Diagnosis Date   AKI (acute kidney injury) (HCC) 02/08/2021   Anemia    Depression    Heart murmur    Seasonal allergies    wool & grass   Sickle cell anemia (HCC)    Past Surgical History:  Procedure Laterality Date   NO PAST SURGERIES     ROOT CANAL     Social History   Tobacco Use   Smoking status: Some Days    Current packs/day: 0.50    Types: Cigars, Cigarettes   Smokeless tobacco: Never   Tobacco comments:    10 cigars per week.  Vaping Use   Vaping status: Never Used  Substance Use Topics   Alcohol use: Not Currently   Drug use: Not Currently    Types: Marijuana, Cocaine   Social History   Socioeconomic History   Marital status: Single    Spouse name: Not on file   Number of children: Not on file   Years of education: Not on file   Highest education level: Not on file  Occupational History   Not on file  Tobacco Use   Smoking status: Some Days    Current packs/day: 0.50    Types: Cigars, Cigarettes   Smokeless tobacco: Never   Tobacco comments:    10 cigars per week.  Vaping Use   Vaping status: Never Used  Substance and Sexual Activity   Alcohol use: Not Currently   Drug use: Not Currently  Types: Marijuana, Cocaine   Sexual activity: Yes  Other Topics Concern   Not on file  Social History Narrative   Lives with Friends in [place]. Working on housing for himself   On disability for his SS disease      No children, no stable relationship   Social Determinants of Corporate investment banker Strain: Not on file  Food Insecurity: Food Insecurity Present (09/10/2022)   Hunger Vital Sign    Worried About Running Out of Food in the Last Year: Often true    Ran Out of Food in the Last Year: Often true  Transportation Needs: No Transportation Needs (09/10/2022)   PRAPARE - Administrator, Civil Service (Medical): No    Lack of  Transportation (Non-Medical): No  Physical Activity: Not on file  Stress: Not on file  Social Connections: Not on file  Intimate Partner Violence: Not At Risk (09/10/2022)   Humiliation, Afraid, Rape, and Kick questionnaire    Fear of Current or Ex-Partner: No    Emotionally Abused: No    Physically Abused: No    Sexually Abused: No   Family Status  Relation Name Status   Mother  Alive   Father  Deceased   Sister  (Not Specified)   Brother  (Not Specified)   MGM  (Not Specified)   MGF  (Not Specified)   PGM  (Not Specified)   PGF  (Not Specified)  No partnership data on file   Family History  Problem Relation Age of Onset   Alcohol abuse Mother    Sickle cell trait Mother    Hypertension Mother    Alcohol abuse Father    Sickle cell trait Father    Early death Father        due to alcoholism   Hypertension Father    Hypertension Sister    Hypertension Brother    Hypertension Maternal Grandmother    Hypertension Maternal Grandfather    Hypertension Paternal Grandmother    Hypertension Paternal Grandfather    Allergies  Allergen Reactions   Aspirin Other (See Comments)    Increased heart rate   Tape Other (See Comments)    rash   Tramadol Hives and Itching      Review of Systems  Constitutional: Negative.   HENT: Negative.    Cardiovascular: Negative.   Gastrointestinal: Negative.   Genitourinary: Negative.   Musculoskeletal:  Positive for back pain and joint pain.  Skin: Negative.   Neurological: Negative.   Psychiatric/Behavioral: Negative.        Objective:     BP (!) 120/41   Pulse 87   Temp (!) 97.1 F (36.2 C)   Wt 162 lb (73.5 kg)   SpO2 100%   BMI 20.25 kg/m  BP Readings from Last 3 Encounters:  06/25/23 (!) 120/41  03/26/23 126/66  12/25/22 (!) 108/49   Wt Readings from Last 3 Encounters:  06/25/23 162 lb (73.5 kg)  03/26/23 165 lb 9.6 oz (75.1 kg)  12/25/22 177 lb (80.3 kg)      Physical Exam Constitutional:       Appearance: Normal appearance.  Eyes:     Pupils: Pupils are equal, round, and reactive to light.  Cardiovascular:     Rate and Rhythm: Normal rate and regular rhythm.     Pulses: Normal pulses.  Pulmonary:     Effort: Pulmonary effort is normal.  Abdominal:     General: Bowel sounds are normal.  Musculoskeletal:  General: Normal range of motion.  Neurological:     General: No focal deficit present.     Mental Status: He is alert. Mental status is at baseline.  Psychiatric:        Mood and Affect: Mood normal.        Behavior: Behavior normal.        Thought Content: Thought content normal.        Judgment: Judgment normal.      No results found for any visits on 06/25/23.  Last CBC Lab Results  Component Value Date   WBC 8.7 03/26/2023   HGB 6.3 (LL) 03/26/2023   HCT 20.1 (L) 03/26/2023   MCV 95 03/26/2023   MCH 29.9 03/26/2023   RDW 24.5 (H) 03/26/2023   PLT 204 03/26/2023   Last metabolic panel Lab Results  Component Value Date   GLUCOSE 91 03/26/2023   NA 140 03/26/2023   K 3.0 (L) 03/26/2023   CL 97 03/26/2023   CO2 28 03/26/2023   BUN 12 03/26/2023   CREATININE 0.77 03/26/2023   EGFR 105 03/26/2023   CALCIUM 9.8 03/26/2023   PROT 6.5 03/26/2023   ALBUMIN 4.2 03/26/2023   LABGLOB 2.3 03/26/2023   AGRATIO 1.8 03/26/2023   BILITOT 0.6 03/26/2023   ALKPHOS 103 03/26/2023   AST 31 03/26/2023   ALT 23 03/26/2023   ANIONGAP 7 09/14/2022   Last lipids Lab Results  Component Value Date   CHOL 127 03/01/2021   HDL 43 03/01/2021   LDLCALC 66 03/01/2021   TRIG 89 03/01/2021   CHOLHDL 3.0 03/01/2021   Last hemoglobin A1c Lab Results  Component Value Date   HGBA1C <4.2 (L) 03/01/2021   Last thyroid functions Lab Results  Component Value Date   TSH 1.730 03/01/2021   Last vitamin D Lab Results  Component Value Date   VD25OH 27.7 (L) 03/26/2023   Last vitamin B12 and Folate No results found for: "VITAMINB12", "FOLATE"    The ASCVD  Risk score (Arnett DK, et al., 2019) failed to calculate for the following reasons:   The valid total cholesterol range is 130 to 320 mg/dL    Assessment & Plan:   Problem List Items Addressed This Visit       Other   Vitamin D deficiency   Sickle cell anemia (HCC)   Chronic pain   Relevant Orders   [id number] 11+Oxyco+Alc+Crt-Bund   Other Visit Diagnoses     Colon cancer screening    -  Primary   Relevant Orders   Cologuard     1. Sickle cell disease without crisis [facility]) Reviewed PDMP substance reporting system prior to prescribing opiate medications. No inconsistencies noted.   - Sickle Cell Panel - oxyCODONE-acetaminophen (PERCOCET) 10-325 MG tablet; Take 1 tablet by mouth every 6 (six) hours as needed for up to 15 days for pain.  Dispense: 60 tablet; Refill: 0  2. Colon cancer screening  - Cologuard  3. Vitamin D deficiency - Sickle Cell Panel  4. Chronic pain syndrome  - [id number] 11+Oxyco+Alc+Crt-Bund - Sickle Cell Panel - oxyCODONE-acetaminophen (PERCOCET) 10-325 MG tablet; Take 1 tablet by mouth every 6 (six) hours as needed for up to 15 days for pain.  Dispense: 60 tablet; Refill: 0   Return in about 3 months (around 09/24/2023) for sickle cell anemia.    [name]  APRN, MSN, FNP-C Patient Care [facility] [address] [place], [place] [address] [phone]

## 2023-06-26 ENCOUNTER — Other Ambulatory Visit (HOSPITAL_COMMUNITY): Payer: Self-pay

## 2023-06-26 NOTE — Progress Notes (Signed)
Scott Norton is a 57 year old male with a medical history significant for sickle cell disease, chronic pain syndrome, and anemia of chronic disease that was evaluated in clinic on 06/25/2023.  Reviewed patient's labs.  Serum creatinine (indicator of renal functioning) is elevated at 1.68.  Please advise patient to avoid all nephrotoxins such as naproxen and ibuprofen.  Recommend hydrating with 32 to 64 ounces of fluid per day.  Patient will need to return in 1 week to repeat BMP.  Please ensure that patient has lab appointment scheduled. [name]  APRN, MSN, FNP-C Patient Care [facility] [address] [place], [place] [address] [phone]

## 2023-06-27 LAB — CMP14+CBC/D/PLT+FER+RETIC+V...
ALT: 13 IU/L (ref 0–44)
AST: 53 IU/L — ABNORMAL HIGH (ref 0–40)
Albumin: 4.6 g/dL (ref 3.8–4.9)
Alkaline Phosphatase: 283 IU/L — ABNORMAL HIGH (ref 44–121)
BUN/Creatinine Ratio: 14 (ref 9–20)
BUN: 24 mg/dL (ref 6–24)
Basophils Absolute: 0 10*3/uL (ref 0.0–0.2)
Basos: 1 %
Bilirubin Total: 2.6 mg/dL — ABNORMAL HIGH (ref 0.0–1.2)
Chloride: 114 mmol/L — ABNORMAL HIGH (ref 96–106)
Creatinine, Ser: 1.68 mg/dL — ABNORMAL HIGH (ref 0.76–1.27)
EOS (ABSOLUTE): 0.2 10*3/uL (ref 0.0–0.4)
Eos: 3 %
Ferritin: 198 ng/mL (ref 30–400)
Globulin, Total: 2.9 g/dL (ref 1.5–4.5)
Glucose: 91 mg/dL (ref 70–99)
Hematocrit: 19.4 % — ABNORMAL LOW (ref 37.5–51.0)
Hemoglobin: 6.5 g/dL — CL (ref 13.0–17.7)
Immature Grans (Abs): 0 10*3/uL (ref 0.0–0.1)
Immature Granulocytes: 1 %
Lymphocytes Absolute: 2.4 10*3/uL (ref 0.7–3.1)
Lymphs: 35 %
MCH: 32.7 pg (ref 26.6–33.0)
MCHC: 33.5 g/dL (ref 31.5–35.7)
MCV: 98 fL — ABNORMAL HIGH (ref 79–97)
Monocytes Absolute: 0.7 10*3/uL (ref 0.1–0.9)
Monocytes: 10 %
NRBC: 9 % — ABNORMAL HIGH (ref 0–0)
Neutrophils Absolute: 3.6 10*3/uL (ref 1.4–7.0)
Neutrophils: 50 %
Platelets: 146 10*3/uL — ABNORMAL LOW (ref 150–450)
Potassium: 5.2 mmol/L (ref 3.5–5.2)
RBC: 1.99 x10E6/uL — CL (ref 4.14–5.80)
RDW: 22.7 % — ABNORMAL HIGH (ref 11.6–15.4)
Retic Ct Pct: 10.6 % — ABNORMAL HIGH (ref 0.6–2.6)
Sodium: 150 mmol/L — ABNORMAL HIGH (ref 134–144)
Total Protein: 7.5 g/dL (ref 6.0–8.5)
Vit D, 25-Hydroxy: 36.1 ng/mL (ref 30.0–100.0)
WBC: 7 10*3/uL (ref 3.4–10.8)
eGFR: 47 mL/min/{1.73_m2} — ABNORMAL LOW (ref >59)

## 2023-06-29 ENCOUNTER — Other Ambulatory Visit (HOSPITAL_COMMUNITY): Payer: Self-pay

## 2023-06-29 LAB — DRUG SCREEN 764883 11+OXYCO+ALC+CRT-BUND
Amphetamines, Urine: NEGATIVE ng/mL
BENZODIAZ UR QL: NEGATIVE ng/mL
Barbiturate: NEGATIVE ng/mL
Cannabinoid Quant, Ur: NEGATIVE ng/mL
Cocaine (Metabolite): NEGATIVE ng/mL
Creatinine: 95.4 mg/dL (ref 20.0–300.0)
Ethanol: NEGATIVE %
Meperidine: NEGATIVE ng/mL
Methadone Screen, Urine: NEGATIVE ng/mL
OPIATE SCREEN URINE: NEGATIVE ng/mL
Phencyclidine: NEGATIVE ng/mL
Propoxyphene: NEGATIVE ng/mL
Tramadol: NEGATIVE ng/mL
pH, Urine: 5.3 (ref 4.5–8.9)

## 2023-06-29 LAB — OXYCODONE/OXYMORPHONE, CONFIRM: OXYCODONE/OXYMORPH: NEGATIVE

## 2023-07-08 ENCOUNTER — Other Ambulatory Visit: Payer: Self-pay | Admitting: Family Medicine

## 2023-07-08 DIAGNOSIS — G894 Chronic pain syndrome: Secondary | ICD-10-CM

## 2023-07-08 DIAGNOSIS — D571 Sickle-cell disease without crisis: Secondary | ICD-10-CM

## 2023-07-08 NOTE — Telephone Encounter (Signed)
Please advise KH 

## 2023-07-09 ENCOUNTER — Other Ambulatory Visit: Payer: Self-pay | Admitting: Family Medicine

## 2023-07-10 ENCOUNTER — Other Ambulatory Visit: Payer: Self-pay | Admitting: Family Medicine

## 2023-07-10 DIAGNOSIS — G894 Chronic pain syndrome: Secondary | ICD-10-CM

## 2023-07-10 DIAGNOSIS — D571 Sickle-cell disease without crisis: Secondary | ICD-10-CM

## 2023-07-10 NOTE — Telephone Encounter (Signed)
Please advise Kh

## 2023-07-11 ENCOUNTER — Other Ambulatory Visit: Payer: Self-pay | Admitting: Family Medicine

## 2023-07-11 ENCOUNTER — Other Ambulatory Visit (HOSPITAL_COMMUNITY): Payer: Self-pay

## 2023-07-11 DIAGNOSIS — G894 Chronic pain syndrome: Secondary | ICD-10-CM

## 2023-07-11 DIAGNOSIS — D571 Sickle-cell disease without crisis: Secondary | ICD-10-CM

## 2023-07-11 MED ORDER — OXYCODONE-ACETAMINOPHEN 10-325 MG PO TABS
1.0000 | ORAL_TABLET | Freq: Four times a day (QID) | ORAL | 0 refills | Status: AC | PRN
Start: 2023-07-13 — End: 2023-07-28
  Filled 2023-07-13: qty 60, 15d supply, fill #0

## 2023-07-11 NOTE — Progress Notes (Signed)
Reviewed PDMP substance reporting system prior to prescribing opiate medications. No inconsistencies noted.   Meds ordered this encounter  Medications  . oxyCODONE-acetaminophen (PERCOCET) 10-325 MG tablet    Sig: Take 1 tablet by mouth every 6 (six) hours as needed for up to 15 days for pain.    Dispense:  60 tablet    Refill:  0    Order Specific Question:   Supervising Provider    Answer:   Quentin Angst [[id number]]    [name]  APRN, MSN, FNP-C Patient Care [facility] [address] [place], [place] [address] [phone]

## 2023-07-13 ENCOUNTER — Other Ambulatory Visit (HOSPITAL_COMMUNITY): Payer: Self-pay

## 2023-07-16 ENCOUNTER — Other Ambulatory Visit: Payer: Self-pay | Admitting: Family Medicine

## 2023-07-16 DIAGNOSIS — G894 Chronic pain syndrome: Secondary | ICD-10-CM

## 2023-07-16 DIAGNOSIS — D571 Sickle-cell disease without crisis: Secondary | ICD-10-CM

## 2023-07-17 ENCOUNTER — Other Ambulatory Visit (HOSPITAL_COMMUNITY): Payer: Self-pay

## 2023-07-17 MED ORDER — IBUPROFEN 800 MG PO TABS
800.0000 mg | ORAL_TABLET | Freq: Three times a day (TID) | ORAL | 1 refills | Status: DC | PRN
Start: 2023-07-17 — End: 2023-09-23
  Filled 2023-07-17: qty 60, 20d supply, fill #0
  Filled 2023-08-23: qty 60, 20d supply, fill #1

## 2023-07-17 NOTE — Telephone Encounter (Signed)
Please advise Kh 

## 2023-07-19 ENCOUNTER — Other Ambulatory Visit (HOSPITAL_COMMUNITY): Payer: Self-pay

## 2023-07-23 ENCOUNTER — Other Ambulatory Visit: Payer: Self-pay | Admitting: Family Medicine

## 2023-07-23 DIAGNOSIS — G894 Chronic pain syndrome: Secondary | ICD-10-CM

## 2023-07-23 DIAGNOSIS — D571 Sickle-cell disease without crisis: Secondary | ICD-10-CM

## 2023-07-23 NOTE — Telephone Encounter (Signed)
Please advise KH 

## 2023-07-24 ENCOUNTER — Other Ambulatory Visit: Payer: Self-pay | Admitting: Family Medicine

## 2023-07-24 DIAGNOSIS — D571 Sickle-cell disease without crisis: Secondary | ICD-10-CM

## 2023-07-24 DIAGNOSIS — G894 Chronic pain syndrome: Secondary | ICD-10-CM

## 2023-07-25 ENCOUNTER — Other Ambulatory Visit: Payer: Self-pay | Admitting: Family Medicine

## 2023-07-25 ENCOUNTER — Other Ambulatory Visit (HOSPITAL_COMMUNITY): Payer: Self-pay

## 2023-07-25 DIAGNOSIS — G894 Chronic pain syndrome: Secondary | ICD-10-CM

## 2023-07-25 DIAGNOSIS — D571 Sickle-cell disease without crisis: Secondary | ICD-10-CM

## 2023-07-25 MED ORDER — OXYCODONE-ACETAMINOPHEN 10-325 MG PO TABS
1.0000 | ORAL_TABLET | Freq: Four times a day (QID) | ORAL | 0 refills | Status: DC | PRN
  Filled 2023-07-29: qty 60, 15d supply, fill #0

## 2023-07-25 NOTE — Progress Notes (Signed)
Reviewed PDMP substance reporting system prior to prescribing opiate medications. No inconsistencies noted.  Meds ordered this encounter  Medications   oxyCODONE-acetaminophen (PERCOCET) 10-325 MG tablet    Sig: Take 1 tablet by mouth every 6 (six) hours as needed for up to 15 days for pain.    Dispense:  60 tablet    Refill:  0    Order Specific Question:   Supervising Provider    Answer:   JEGEDE, OLUGBEMIGA E [[id number]]   Scott Wyre Moore Linzie Boursiquot  APRN, MSN, FNP-C [facility] [address]  [place], [place] [address] [phone]  

## 2023-07-29 ENCOUNTER — Other Ambulatory Visit (HOSPITAL_COMMUNITY): Payer: Self-pay

## 2023-08-08 ENCOUNTER — Other Ambulatory Visit: Payer: Self-pay | Admitting: Family Medicine

## 2023-08-08 DIAGNOSIS — D571 Sickle-cell disease without crisis: Secondary | ICD-10-CM

## 2023-08-08 DIAGNOSIS — G894 Chronic pain syndrome: Secondary | ICD-10-CM

## 2023-08-12 NOTE — Telephone Encounter (Signed)
Please advise KH 

## 2023-08-13 ENCOUNTER — Other Ambulatory Visit (HOSPITAL_COMMUNITY): Payer: Self-pay

## 2023-08-13 ENCOUNTER — Other Ambulatory Visit: Payer: Self-pay | Admitting: Nurse Practitioner

## 2023-08-13 DIAGNOSIS — G894 Chronic pain syndrome: Secondary | ICD-10-CM

## 2023-08-13 DIAGNOSIS — D571 Sickle-cell disease without crisis: Secondary | ICD-10-CM

## 2023-08-13 MED ORDER — OXYCODONE-ACETAMINOPHEN 10-325 MG PO TABS
1.0000 | ORAL_TABLET | Freq: Four times a day (QID) | ORAL | 0 refills | Status: DC | PRN
Start: 2023-08-13 — End: 2023-08-23
  Filled 2023-08-13: qty 60, 15d supply, fill #0

## 2023-08-13 NOTE — Telephone Encounter (Signed)
Caller & Relationship to patient:  MRN #  [id number]   Call Back Number:   Date of Last Office Visit: 07/24/2023     Date of Next Office Visit: 09/24/2023    Medication(s) to be Refilled: Oxycodone  Preferred Pharmacy:   ** Please notify patient to allow 48-72 hours to process** **Let patient know to contact pharmacy at the end of the day to make sure medication is ready. ** **If patient has not been seen in a year or longer, book an appointment **Advise to use MyChart for refill requests OR to contact their pharmacy

## 2023-08-23 ENCOUNTER — Other Ambulatory Visit (HOSPITAL_COMMUNITY): Payer: Self-pay

## 2023-08-23 ENCOUNTER — Other Ambulatory Visit: Payer: Self-pay | Admitting: Family Medicine

## 2023-08-23 ENCOUNTER — Other Ambulatory Visit: Payer: Self-pay | Admitting: Nurse Practitioner

## 2023-08-23 DIAGNOSIS — D571 Sickle-cell disease without crisis: Secondary | ICD-10-CM

## 2023-08-23 DIAGNOSIS — G894 Chronic pain syndrome: Secondary | ICD-10-CM

## 2023-08-23 MED ORDER — OXYCODONE-ACETAMINOPHEN 10-325 MG PO TABS
1.0000 | ORAL_TABLET | Freq: Four times a day (QID) | ORAL | 0 refills | Status: DC | PRN
  Filled 2023-08-28: qty 60, 15d supply, fill #0

## 2023-08-23 NOTE — Progress Notes (Signed)
Reviewed PDMP substance reporting system prior to prescribing opiate medications. No inconsistencies noted.  Meds ordered this encounter  Medications   oxyCODONE-acetaminophen (PERCOCET) 10-325 MG tablet    Sig: Take 1 tablet by mouth every 6 (six) hours as needed for up to 15 days for pain.    Dispense:  60 tablet    Refill:  0    Order Specific Question:   Supervising Provider    Answer:   JEGEDE, OLUGBEMIGA E [[id number]]   Temprance Wyre Moore Linzie Boursiquot  APRN, MSN, FNP-C [facility] [address]  [place], [place] [address] [phone]  

## 2023-08-25 ENCOUNTER — Other Ambulatory Visit: Payer: Self-pay

## 2023-08-25 ENCOUNTER — Inpatient Hospital Stay (HOSPITAL_COMMUNITY)
Admission: EM | Admit: 2023-08-25 | Discharge: 2023-08-29 | DRG: 811 | Disposition: A | Discharge status: Home / Self Care | Payer: 59 | Attending: Internal Medicine | Admitting: Internal Medicine

## 2023-08-25 ENCOUNTER — Encounter (HOSPITAL_COMMUNITY): Payer: Self-pay

## 2023-08-25 DIAGNOSIS — Z885 Allergy status to narcotic agent status: Secondary | ICD-10-CM

## 2023-08-25 DIAGNOSIS — D571 Sickle-cell disease without crisis: Secondary | ICD-10-CM

## 2023-08-25 DIAGNOSIS — N189 Chronic kidney disease, unspecified: Secondary | ICD-10-CM | POA: Diagnosis present

## 2023-08-25 DIAGNOSIS — F1414 Cocaine abuse with cocaine-induced mood disorder: Secondary | ICD-10-CM | POA: Diagnosis present

## 2023-08-25 DIAGNOSIS — D638 Anemia in other chronic diseases classified elsewhere: Secondary | ICD-10-CM | POA: Diagnosis present

## 2023-08-25 DIAGNOSIS — G894 Chronic pain syndrome: Secondary | ICD-10-CM | POA: Diagnosis present

## 2023-08-25 DIAGNOSIS — J9601 Acute respiratory failure with hypoxia: Secondary | ICD-10-CM | POA: Diagnosis present

## 2023-08-25 DIAGNOSIS — D72829 Elevated white blood cell count, unspecified: Secondary | ICD-10-CM | POA: Diagnosis present

## 2023-08-25 DIAGNOSIS — I5032 Chronic diastolic (congestive) heart failure: Secondary | ICD-10-CM | POA: Diagnosis present

## 2023-08-25 DIAGNOSIS — Z5900 Homelessness unspecified: Secondary | ICD-10-CM | POA: Diagnosis not present

## 2023-08-25 DIAGNOSIS — T50916A Underdosing of multiple unspecified drugs, medicaments and biological substances, initial encounter: Secondary | ICD-10-CM | POA: Diagnosis present

## 2023-08-25 DIAGNOSIS — N179 Acute kidney failure, unspecified: Secondary | ICD-10-CM | POA: Diagnosis present

## 2023-08-25 DIAGNOSIS — D5701 Hb-SS disease with acute chest syndrome: Principal | ICD-10-CM | POA: Diagnosis not present

## 2023-08-25 DIAGNOSIS — Z8249 Family history of ischemic heart disease and other diseases of the circulatory system: Secondary | ICD-10-CM

## 2023-08-25 DIAGNOSIS — E440 Moderate protein-calorie malnutrition: Secondary | ICD-10-CM | POA: Diagnosis present

## 2023-08-25 DIAGNOSIS — D696 Thrombocytopenia, unspecified: Secondary | ICD-10-CM | POA: Diagnosis present

## 2023-08-25 DIAGNOSIS — F17290 Nicotine dependence, other tobacco product, uncomplicated: Secondary | ICD-10-CM | POA: Diagnosis present

## 2023-08-25 DIAGNOSIS — J189 Pneumonia, unspecified organism: Secondary | ICD-10-CM | POA: Diagnosis present

## 2023-08-25 DIAGNOSIS — Z91128 Patient's intentional underdosing of medication regimen for other reason: Secondary | ICD-10-CM | POA: Diagnosis not present

## 2023-08-25 DIAGNOSIS — R109 Unspecified abdominal pain: Secondary | ICD-10-CM | POA: Diagnosis present

## 2023-08-25 DIAGNOSIS — Z681 Body mass index (BMI) 19 or less, adult: Secondary | ICD-10-CM | POA: Diagnosis not present

## 2023-08-25 DIAGNOSIS — J449 Chronic obstructive pulmonary disease, unspecified: Secondary | ICD-10-CM | POA: Diagnosis present

## 2023-08-25 DIAGNOSIS — D5700 Hb-SS disease with crisis, unspecified: Secondary | ICD-10-CM | POA: Diagnosis not present

## 2023-08-25 DIAGNOSIS — Z832 Family history of diseases of the blood and blood-forming organs and certain disorders involving the immune mechanism: Secondary | ICD-10-CM

## 2023-08-25 DIAGNOSIS — Z720 Tobacco use: Secondary | ICD-10-CM | POA: Diagnosis present

## 2023-08-25 DIAGNOSIS — E860 Dehydration: Secondary | ICD-10-CM | POA: Diagnosis present

## 2023-08-25 DIAGNOSIS — Z886 Allergy status to analgesic agent status: Secondary | ICD-10-CM

## 2023-08-25 DIAGNOSIS — D649 Anemia, unspecified: Principal | ICD-10-CM

## 2023-08-25 LAB — RETICULOCYTES
Immature Retic Fract: 28.3 % — ABNORMAL HIGH (ref 2.3–15.9)
RBC.: 1.71 MIL/uL — ABNORMAL LOW (ref 4.22–5.81)
Retic Count, Absolute: 48.2 10*3/uL (ref 19.0–186.0)
Retic Ct Pct: 2.8 % (ref 0.4–3.1)

## 2023-08-25 LAB — COMPREHENSIVE METABOLIC PANEL
ALT: 39 U/L (ref 0–44)
AST: 109 U/L — ABNORMAL HIGH (ref 15–41)
Albumin: 4 g/dL (ref 3.5–5.0)
Alkaline Phosphatase: 171 U/L — ABNORMAL HIGH (ref 38–126)
Anion gap: 10 (ref 5–15)
BUN: 49 mg/dL — ABNORMAL HIGH (ref 6–20)
CO2: 18 mmol/L — ABNORMAL LOW (ref 22–32)
Calcium: 9.1 mg/dL (ref 8.9–10.3)
Chloride: 108 mmol/L (ref 98–111)
Creatinine, Ser: 2.74 mg/dL — ABNORMAL HIGH (ref 0.61–1.24)
GFR, Estimated: 26 mL/min — ABNORMAL LOW (ref >60)
Glucose, Bld: 97 mg/dL (ref 70–99)
Potassium: 5 mmol/L (ref 3.5–5.1)
Sodium: 136 mmol/L (ref 135–145)
Total Bilirubin: 2.4 mg/dL — ABNORMAL HIGH (ref 0.3–1.2)
Total Protein: 7.2 g/dL (ref 6.5–8.1)

## 2023-08-25 LAB — PROTIME-INR
INR: 1.1 (ref 0.8–1.2)
Prothrombin Time: 14.8 s (ref 11.4–15.2)

## 2023-08-25 LAB — CBC WITH DIFFERENTIAL/PLATELET
Abs Immature Granulocytes: 0.03 10*3/uL (ref 0.00–0.07)
Basophils Absolute: 0 10*3/uL (ref 0.0–0.1)
Basophils Relative: 0 %
Eosinophils Absolute: 0 10*3/uL (ref 0.0–0.5)
Eosinophils Relative: 0 %
HCT: 14 % — ABNORMAL LOW (ref 39.0–52.0)
Hemoglobin: 5.4 g/dL — CL (ref 13.0–17.0)
Immature Granulocytes: 1 %
Lymphocytes Relative: 36 %
Lymphs Abs: 2.4 10*3/uL (ref 0.7–4.0)
MCH: 32 pg (ref 26.0–34.0)
MCHC: 38.6 g/dL — ABNORMAL HIGH (ref 30.0–36.0)
MCV: 82.8 fL (ref 80.0–100.0)
Monocytes Absolute: 1.5 10*3/uL — ABNORMAL HIGH (ref 0.1–1.0)
Monocytes Relative: 23 %
Neutro Abs: 2.7 10*3/uL (ref 1.7–7.7)
Neutrophils Relative %: 40 %
Platelets: 110 10*3/uL — ABNORMAL LOW (ref 150–400)
RBC: 1.69 MIL/uL — ABNORMAL LOW (ref 4.22–5.81)
RDW: 23.4 % — ABNORMAL HIGH (ref 11.5–15.5)
WBC: 6.6 10*3/uL (ref 4.0–10.5)
nRBC: 14.4 % — ABNORMAL HIGH (ref 0.0–0.2)

## 2023-08-25 LAB — PREPARE RBC (CROSSMATCH)

## 2023-08-25 LAB — RAPID URINE DRUG SCREEN, HOSP PERFORMED
Amphetamines: NOT DETECTED
Barbiturates: NOT DETECTED
Benzodiazepines: NOT DETECTED
Cocaine: NOT DETECTED
Opiates: NOT DETECTED
Tetrahydrocannabinol: NOT DETECTED

## 2023-08-25 LAB — APTT: aPTT: 35 s (ref 24–36)

## 2023-08-25 MED ORDER — CEFTRIAXONE SODIUM 1 G IJ SOLR
1.0000 g | INTRAMUSCULAR | Status: DC
  Administered 2023-08-25 – 2023-08-28 (×4): 1 g via INTRAVENOUS
  Filled 2023-08-25 (×4): qty 10

## 2023-08-25 MED ORDER — SODIUM CHLORIDE 0.9% FLUSH: 9.0000 mL | INTRAVENOUS | Status: DC | PRN

## 2023-08-25 MED ORDER — NICOTINE 21 MG/24HR TD PT24
21.0000 mg | MEDICATED_PATCH | Freq: Every day | TRANSDERMAL | Status: DC
  Filled 2023-08-25 (×4): qty 1

## 2023-08-25 MED ORDER — SODIUM CHLORIDE 0.9% IV SOLUTION: Freq: Once | INTRAVENOUS | Status: AC

## 2023-08-25 MED ORDER — DEXTROSE-SODIUM CHLORIDE 5-0.45 % IV SOLN: INTRAVENOUS | Status: AC

## 2023-08-25 MED ORDER — SENNOSIDES-DOCUSATE SODIUM 8.6-50 MG PO TABS
1.0000 | ORAL_TABLET | Freq: Two times a day (BID) | ORAL | Status: DC
  Administered 2023-08-26 – 2023-08-29 (×5): 1 via ORAL
  Filled 2023-08-25 (×8): qty 1

## 2023-08-25 MED ORDER — POLYETHYLENE GLYCOL 3350 17 G PO PACK: 17.0000 g | PACK | Freq: Every day | ORAL | Status: DC | PRN

## 2023-08-25 MED ORDER — ONDANSETRON HCL 4 MG/2ML IJ SOLN
4.0000 mg | INTRAMUSCULAR | Status: DC | PRN
  Administered 2023-08-25: 4 mg via INTRAVENOUS
  Filled 2023-08-25: qty 2

## 2023-08-25 MED ORDER — DEXTROSE 5 % IV SOLN
500.0000 mg | INTRAVENOUS | Status: DC
  Administered 2023-08-25 – 2023-08-27 (×3): 500 mg via INTRAVENOUS
  Filled 2023-08-25 (×3): qty 5

## 2023-08-25 MED ORDER — NALOXONE HCL 0.4 MG/ML IJ SOLN: 0.4000 mg | INTRAMUSCULAR | Status: DC | PRN

## 2023-08-25 MED ORDER — LOPERAMIDE HCL 2 MG PO CAPS
2.0000 mg | ORAL_CAPSULE | Freq: Once | ORAL | Status: AC
  Administered 2023-08-26: 2 mg via ORAL
  Filled 2023-08-25: qty 1

## 2023-08-25 MED ORDER — ENOXAPARIN SODIUM 40 MG/0.4ML IJ SOSY: 40.0000 mg | PREFILLED_SYRINGE | INTRAMUSCULAR | Status: DC

## 2023-08-25 MED ORDER — HYDROMORPHONE HCL 1 MG/ML IJ SOLN
0.5000 mg | INTRAMUSCULAR | Status: AC
  Administered 2023-08-25: 0.5 mg via INTRAVENOUS
  Filled 2023-08-25: qty 1

## 2023-08-25 MED ORDER — ACETAMINOPHEN 325 MG PO TABS
650.0000 mg | ORAL_TABLET | Freq: Once | ORAL | Status: AC
  Administered 2023-08-25: 650 mg via ORAL
  Filled 2023-08-25: qty 2

## 2023-08-25 MED ORDER — DIPHENHYDRAMINE HCL 25 MG PO CAPS
25.0000 mg | ORAL_CAPSULE | ORAL | Status: DC | PRN
  Administered 2023-08-25 – 2023-08-26 (×2): 25 mg via ORAL
  Filled 2023-08-25 (×2): qty 1

## 2023-08-25 MED ORDER — SODIUM CHLORIDE 0.9 % IV BOLUS
1000.0000 mL | Freq: Once | INTRAVENOUS | Status: AC
  Administered 2023-08-25: 1000 mL via INTRAVENOUS

## 2023-08-25 MED ORDER — HYDROMORPHONE 1 MG/ML IV SOLN
INTRAVENOUS | Status: DC
Start: 2023-08-25 — End: 2023-08-29
  Administered 2023-08-25: 30 mg via INTRAVENOUS
  Administered 2023-08-26: 1 mg via INTRAVENOUS
  Administered 2023-08-26: 0.1 mg via INTRAVENOUS
  Administered 2023-08-26: 0.4 mg via INTRAVENOUS
  Administered 2023-08-26: 0.5 mg via INTRAVENOUS
  Administered 2023-08-26: 0.1 mg via INTRAVENOUS
  Administered 2023-08-26: 1 mL via INTRAVENOUS
  Administered 2023-08-26 – 2023-08-27 (×3): 0.5 mg via INTRAVENOUS
  Administered 2023-08-28: 0.1 mg via INTRAVENOUS
  Administered 2023-08-28: 1 mg via INTRAVENOUS
  Administered 2023-08-28: 0.1 mg via INTRAVENOUS
  Administered 2023-08-28: 0.0001 mg via INTRAVENOUS
  Administered 2023-08-29: 0.5 mg via INTRAVENOUS
  Filled 2023-08-25: qty 30

## 2023-08-25 MED ORDER — SODIUM CHLORIDE 0.9 % IV SOLN
12.5000 mg | Freq: Once | INTRAVENOUS | Status: AC
  Administered 2023-08-25: 12.5 mg via INTRAVENOUS
  Filled 2023-08-25: qty 0.25
  Filled 2023-08-25: qty 12.5

## 2023-08-25 MED ORDER — KETOROLAC TROMETHAMINE 15 MG/ML IJ SOLN
15.0000 mg | Freq: Four times a day (QID) | INTRAMUSCULAR | Status: DC
Start: 2023-08-25 — End: 2023-08-30
  Administered 2023-08-25 – 2023-08-27 (×8): 15 mg via INTRAVENOUS
  Filled 2023-08-25 (×8): qty 1

## 2023-08-25 NOTE — ED Provider Notes (Signed)
[place] EMERGENCY DEPARTMENT AT [facility] Provider Note   CSN: [id number] Arrival date & time: 08/25/23  1047     History  Chief Complaint  Patient presents with   Weakness   Abdominal Pain   Sickle Cell Pain Crisis    Scott Norton is a 57 y.o. male.  Patient with history of sickle cell disease presents today with complaints of sickle cell pain crisis. He states that he and his significant other had some sort of stomach bug after eating [employer]. They both had several bouts of watery diarrhea.  Patient states that he is no longer having diarrhea.  Soon after this resolved, he began to feel pain consistent with his sickle cell crises.  He notes pain in his shoulders and back consistent with previous sickle cell crises.  He is on oral oxycodone, states he is not taking any of this medication.  He does have plenty of the medication available, however he states that it causes stomach upset and given his recent bouts of diarrhea he did not want to take anything that would be more upsetting for his stomach.  He does also note that he has felt generally weak over the last few days and has decreased oral intake.  Denies fevers, chills, chest pain, shortness of breath, nausea, vomiting, or abdominal pain.  The history is provided by the patient. No language interpreter was used.  Weakness Abdominal Pain Sickle Cell Pain Crisis      Home Medications Prior to Admission medications   Medication Sig Start Date End Date Taking? Authorizing Provider  ibuprofen (ADVIL) 800 MG tablet Take 1 tablet by mouth every 8 hours as needed. Patient taking differently: Take 800 mg by mouth every 4 (four) hours as needed for mild pain (pain score 1-3) or moderate pain (pain score 4-6). 07/17/23  Yes Massie Maroon, FNP  oxyCODONE-acetaminophen (PERCOCET) 10-325 MG tablet Take 1 tablet by mouth every 6 (six) hours as needed for up to 15 days for pain. 08/28/23 09/12/23 Yes Massie Maroon, FNP  folic acid (FOLVITE) 1 MG tablet Take 1 tablet (1 mg) by mouth daily. Patient not taking: Reported on 06/25/2023 03/26/23 03/25/24  Massie Maroon, FNP      Allergies    Aspirin, Tape, and Tramadol    Review of Systems   Review of Systems  Neurological:  Positive for weakness.  All other systems reviewed and are negative.   Physical Exam Updated Vital Signs BP (!) 104/59   Pulse (!) 55   Temp 97.9 F (36.6 C) (Oral)   Resp 13   Ht 6\' 3"  (1.905 m)   Wt 74.8 kg   SpO2 95%   BMI 20.62 kg/m  Physical Exam Vitals and nursing note reviewed.  Constitutional:      General: He is not in acute distress.    Appearance: Normal appearance. He is normal weight. He is not ill-appearing, toxic-appearing or diaphoretic.  HENT:     Head: Normocephalic and atraumatic.  Cardiovascular:     Rate and Rhythm: Normal rate and regular rhythm.     Heart sounds: Normal heart sounds.  Pulmonary:     Effort: Pulmonary effort is normal. No respiratory distress.     Breath sounds: Normal breath sounds.  Abdominal:     General: Abdomen is flat.     Palpations: Abdomen is soft.     Tenderness: There is no abdominal tenderness.  Musculoskeletal:  General: Normal range of motion.     Cervical back: Normal range of motion.  Skin:    General: Skin is warm and dry.  Neurological:     General: No focal deficit present.     Mental Status: He is alert.  Psychiatric:        Mood and Affect: Mood normal.        Behavior: Behavior normal.     ED Results / Procedures / Treatments   Labs (all labs ordered are listed, but only abnormal results are displayed) Labs Reviewed  COMPREHENSIVE METABOLIC PANEL - Abnormal; Notable for the following components:      Result Value   CO2 18 (*)    BUN 49 (*)    Creatinine, Ser 2.74 (*)    AST 109 (*)    Alkaline Phosphatase 171 (*)    Total Bilirubin 2.4 (*)    GFR, Estimated 26 (*)    All other components within normal limits  RETICULOCYTES -  Abnormal; Notable for the following components:   RBC. 1.71 (*)    Immature Retic Fract 28.3 (*)    All other components within normal limits  CBC WITH DIFFERENTIAL/PLATELET - Abnormal; Notable for the following components:   RBC 1.69 (*)    Hemoglobin 5.4 (*)    HCT 14.0 (*)    MCHC 38.6 (*)    RDW 23.4 (*)    Platelets 110 (*)    nRBC 14.4 (*)    Monocytes Absolute 1.5 (*)    All other components within normal limits  APTT  PROTIME-INR  TYPE AND SCREEN    EKG None  Radiology No results found.  Procedures .Critical Care  Performed by: Silva Bandy, PA-C Authorized by: Silva Bandy, PA-C   Critical care provider statement:    Critical care time (minutes):  35   Critical care was necessary to treat or prevent imminent or life-threatening deterioration of the following conditions:  Circulatory failure   Critical care was time spent personally by me on the following activities:  Development of treatment plan with patient or surrogate, discussions with consultants, discussions with primary provider, evaluation of patient's response to treatment, examination of patient, obtaining history from patient or surrogate, ordering and review of laboratory studies, pulse oximetry, re-evaluation of patient's condition and review of old charts     Medications Ordered in ED Medications  dextrose 5 % and 0.45 % NaCl infusion ( Intravenous New Bag/Given 08/25/23 1404)  ondansetron (ZOFRAN) injection 4 mg (4 mg Intravenous Given 08/25/23 1400)  HYDROmorphone (DILAUDID) injection 0.5 mg (0.5 mg Intravenous Given 08/25/23 1358)  diphenhydrAMINE (BENADRYL) 12.5 mg in sodium chloride 0.9 % 50 mL IVPB (12.5 mg Intravenous New Bag/Given 08/25/23 1411)  HYDROmorphone (DILAUDID) injection 0.5 mg (0.5 mg Intravenous Given 08/25/23 1447)    ED Course/ Medical Decision Making/ A&P                                 Medical Decision Making Amount and/or Complexity of Data Reviewed Labs:  ordered.  Risk Prescription drug management.   This patient is a 57 y.o. male who presents to the ED for concern of sickle cell pain crisis, this involves an extensive number of treatment options, and is a complaint that carries with it a high risk of complications and morbidity. The emergent differential diagnosis prior to evaluation includes, but is not limited to,  pain crisis, dehydration,  hemolytic crisis . This is not an exhaustive differential.   Past Medical History / Co-morbidities / Social History:  has a past medical history of AKI (acute kidney injury) (HCC) (02/08/2021), Anemia, Depression, Heart murmur, Seasonal allergies, and Sickle cell anemia (HCC).  Additional history: Chart reviewed. Pertinent results include: patient rarely in the hospital for sickle cell pain, usually managed adequately outpatient  Physical Exam: Physical exam performed. The pertinent findings include: ill appearing  Lab Tests: I ordered, and personally interpreted labs.  The pertinent results include:  hgb 5.4, lowest seen previously on chart review. Reticulocyte count 2.8. BUN 49, Creatinine 2.74 up from 24 1.68 2 months ago. Bicarb 18   Medications: I ordered medication including fluids, dilaudid, zofran  for sickle cell pain. Reevaluation of the patient after these medicines showed that the patient stayed the same. I have reviewed the patients home medicines and have made adjustments as needed.   Disposition: After consideration of the diagnostic results and the patients response to treatment, I feel that patient will require admission for sickle cell crisis.  His pain is not improved after IV pain medication.  He has an AKI.  His hemoglobin is also 5.4 which is the lowest I can see in his chart previously.  Discussed same with patient who is understanding and agreement with this plan.  Discussed patient with sickle cell attending Dr. Mikeal Hawthorne who accepts patient for admission.  I discussed this case  with my attending physician Dr. Rush Landmark who cosigned this note including patient's presenting symptoms, physical exam, and planned diagnostics and interventions. Attending physician stated agreement with plan or made changes to plan which were implemented.    Final Clinical Impression(s) / ED Diagnoses Final diagnoses:  Hemolytic crisis  Sickle cell pain crisis (HCC)  AKI (acute kidney injury) [facility])    Rx / DC Orders ED Discharge Orders     None         Vear Clock 08/25/23 1613    Tegeler, Canary Brim, MD 08/27/23 1504

## 2023-08-25 NOTE — ED Triage Notes (Signed)
Pt reports sickle cell pain x1 week. Reports pain is all over. Has not taken his prescription medication PTA

## 2023-08-25 NOTE — H&P (Signed)
History and Physical    Patient: Scott Norton ZOX:[id number] DOB: 11/09/1965 DOA: 08/25/2023 DOS: the patient was seen and examined on 08/25/2023 PCP: Massie Maroon, FNP  Patient coming from: Home  Chief Complaint:  Chief Complaint  Patient presents with   Weakness   Abdominal Pain   Sickle Cell Pain Crisis   HPI: Scott Norton is a 57 y.o. male with medical history significant of sickle cell disease, sickle cell anemia, diastolic CHF, cocaine abuse, depression with anxiety who presented to the ER with complaint of pain all over consistent with his typical sickle cell crisis.  Patient reports having pain all over for about a week.  He initially took his prescription medicine did not work.  Today he did not take any.  Came to the ER where he was seen and evaluated.  His hemoglobin is noted to be down to 5.6.  His typical range between 6.5 and 8 g.  Also thrombocytopenia.  Patient has oxygen sats 79% on room air.  He has had previous hypoxic respiratory failure requiring home oxygen.  Patient was unable to get in before due to to being homeless.  His chest x-ray showed right greater than left lower lobe consolidation concerning for acute chest syndrome on oh pneumonia.  At this point patient appears to have sickle cell crisis with acute chest syndrome.  He will be admitted to the hospital for further evaluation and treatment.  Review of Systems: As mentioned in the history of present illness. All other systems reviewed and are negative. Past Medical History:  Diagnosis Date   AKI (acute kidney injury) (HCC) 02/08/2021   Anemia    Depression    Heart murmur    Seasonal allergies    wool & grass   Sickle cell anemia (HCC)    Past Surgical History:  Procedure Laterality Date   NO PAST SURGERIES     ROOT CANAL     Social History:  reports that he has been smoking cigars and cigarettes. He has never used smokeless tobacco. He reports that he does not currently use alcohol. He reports  that he does not currently use drugs after having used the following drugs: Marijuana and Cocaine.  Allergies  Allergen Reactions   Aspirin Other (See Comments)    Increased heart rate   Tape Rash   Tramadol Hives and Itching    Family History  Problem Relation Age of Onset   Alcohol abuse Mother    Sickle cell trait Mother    Hypertension Mother    Alcohol abuse Father    Sickle cell trait Father    Early death Father        due to alcoholism   Hypertension Father    Hypertension Sister    Hypertension Brother    Hypertension Maternal Grandmother    Hypertension Maternal Grandfather    Hypertension Paternal Grandmother    Hypertension Paternal Grandfather     Prior to Admission medications   Medication Sig Start Date End Date Taking? Authorizing Provider  ibuprofen (ADVIL) 800 MG tablet Take 1 tablet by mouth every 8 hours as needed. Patient taking differently: Take 800 mg by mouth every 4 (four) hours as needed for mild pain (pain score 1-3) or moderate pain (pain score 4-6). 07/17/23  Yes Massie Maroon, FNP  oxyCODONE-acetaminophen (PERCOCET) 10-325 MG tablet Take 1 tablet by mouth every 6 (six) hours as needed for up to 15 days for pain. 08/28/23 09/12/23 Yes Massie Maroon, FNP  folic acid (FOLVITE) 1 MG tablet Take 1 tablet (1 mg) by mouth daily. Patient not taking: Reported on 06/25/2023 03/26/23 03/25/24  Massie Maroon, FNP    Physical Exam: Vitals:   08/25/23 1104 08/25/23 1138 08/25/23 1451 08/25/23 1500  BP: 100/60  (!) 95/49 (!) 104/59  Pulse: 73  (!) 53 (!) 55  Resp: 14  13   Temp: 97.9 F (36.6 C)     TempSrc: Oral     SpO2: 94%  (!) 79% 95%  Weight: 74.8 kg     Height: 6\' 3"  (1.905 m) 6\' 3"  (1.905 m)     Constitutional: NAD, calm, comfortable Eyes: PERRL, lids and conjunctivae normal ENMT: Mucous membranes are moist. Posterior pharynx clear of any exudate or lesions.Normal dentition.  Neck: normal, supple, no masses, no thyromegaly Respiratory:  Coarse breath sounds with decreased air entry bilaterally, no wheezing, no crackles. Normal respiratory effort. No accessory muscle use.  Cardiovascular: Regular rate and rhythm, no murmurs / rubs / gallops. No extremity edema. 2+ pedal pulses. No carotid bruits.  Abdomen: no tenderness, no masses palpated. No hepatosplenomegaly. Bowel sounds positive.  Musculoskeletal: Good range of motion, no joint swelling or tenderness, Skin: no rashes, lesions, ulcers. No induration Neurologic: CN 2-12 grossly intact. Sensation intact, DTR normal. Strength 5/5 in all 4.  Psychiatric: Normal judgment and insight. Alert and oriented x 3. Normal mood  Data Reviewed:  Blood pressure 95/49, pulse 53, oxygen sat 79% on room air.  94% on 4 L.  BUN is 49 creatinine 2.74.  Alkaline phosphatase 171 AST 109.  Total bilirubin 2.4.  Hemoglobin is 5.4 with platelets 110.  White count 6.6.  Reticulocyte count is 2.8%.  Chest x-ray showed Right greater than left lower lobe consolidation concerning for acute chest syndrome and or pneumonia.  Also mild interstitial pulmonary edema  Assessment and Plan:  #1 sickle cell crisis with acute chest syndrome: Patient will be admitted to a monitored bed.  Will keep him on oxygen at 4 L/min and titrate down.  Initiate IV Rocephin and Zithromax for possible pneumonia.  Initiate Dilaudid PCA, Toradol and IV fluids.  Continue other supportive care.  #2 acute on chronic anemia: Patient's reticulocyte count is low.  Has also thrombocytopenia.  Does not appear to be hemolysis.  Could be bone marrow related.  Could also be drug related.  Will transfuse 1 unit of packed red blood cells.  Monitor H&H.  Will maintain need to consider parvovirus and aplastic anemia due to sickle cell disease if this persist.  #3 cocaine abuse: Check urine drug screen.  #4 acute kidney injury: Baseline creatinine is 1.2-1.3.  Currently up to 2.76.  Hydrate and monitor renal function.  #5 chronic pain syndrome:  On chronic oxycodone.  Will resume as soon as practicable.  #6 tobacco abuse: Counseling provided.  Will give nicotine patch  #7 acute hypoxic respiratory failure: Possibly due to acute chest syndrome.  Could also be related to his COPD and his diastolic CHF.  Will continue to monitor.    Advance Care Planning:   Code Status: Prior full code  Consults: None  Family Communication: No family at bedside  Severity of Illness: The appropriate patient status for this patient is INPATIENT. Inpatient status is judged to be reasonable and necessary in order to provide the required intensity of service to ensure the patient's safety. The patient's presenting symptoms, physical exam findings, and initial radiographic and laboratory data in the context of their chronic comorbidities is  felt to place them at high risk for further clinical deterioration. Furthermore, it is not anticipated that the patient will be medically stable for discharge from the hospital within 2 midnights of admission.   * I certify that at the point of admission it is my clinical judgment that the patient will require inpatient hospital care spanning beyond 2 midnights from the point of admission due to high intensity of service, high risk for further deterioration and high frequency of surveillance required.*  AuthorLonia Blood, MD 08/25/2023 3:47 PM  For on call review [email or web address].

## 2023-08-26 LAB — CBC WITH DIFFERENTIAL/PLATELET
Abs Immature Granulocytes: 0.08 10*3/uL — ABNORMAL HIGH (ref 0.00–0.07)
Basophils Absolute: 0 10*3/uL (ref 0.0–0.1)
Basophils Relative: 0 %
Eosinophils Absolute: 0 10*3/uL (ref 0.0–0.5)
Eosinophils Relative: 0 %
HCT: 14.5 % — ABNORMAL LOW (ref 39.0–52.0)
Hemoglobin: 5.5 g/dL — CL (ref 13.0–17.0)
Immature Granulocytes: 1 %
Lymphocytes Relative: 42 %
Lymphs Abs: 3.5 10*3/uL (ref 0.7–4.0)
MCH: 31.1 pg (ref 26.0–34.0)
MCHC: 37.9 g/dL — ABNORMAL HIGH (ref 30.0–36.0)
MCV: 81.9 fL (ref 80.0–100.0)
Monocytes Absolute: 1.1 10*3/uL — ABNORMAL HIGH (ref 0.1–1.0)
Monocytes Relative: 13 %
Neutro Abs: 3.6 10*3/uL (ref 1.7–7.7)
Neutrophils Relative %: 44 %
Platelets: 100 10*3/uL — ABNORMAL LOW (ref 150–400)
RBC: 1.77 MIL/uL — ABNORMAL LOW (ref 4.22–5.81)
RDW: 20.4 % — ABNORMAL HIGH (ref 11.5–15.5)
WBC: 8.4 10*3/uL (ref 4.0–10.5)
nRBC: 16.3 % — ABNORMAL HIGH (ref 0.0–0.2)

## 2023-08-26 LAB — PREPARE RBC (CROSSMATCH)

## 2023-08-26 LAB — COMPREHENSIVE METABOLIC PANEL
ALT: 36 U/L (ref 0–44)
AST: 93 U/L — ABNORMAL HIGH (ref 15–41)
Albumin: 3.7 g/dL (ref 3.5–5.0)
Alkaline Phosphatase: 150 U/L — ABNORMAL HIGH (ref 38–126)
Anion gap: 10 (ref 5–15)
BUN: 52 mg/dL — ABNORMAL HIGH (ref 6–20)
CO2: 18 mmol/L — ABNORMAL LOW (ref 22–32)
Calcium: 8.3 mg/dL — ABNORMAL LOW (ref 8.9–10.3)
Chloride: 109 mmol/L (ref 98–111)
Creatinine, Ser: 3.32 mg/dL — ABNORMAL HIGH (ref 0.61–1.24)
GFR, Estimated: 21 mL/min — ABNORMAL LOW (ref >60)
Glucose, Bld: 99 mg/dL (ref 70–99)
Potassium: 5 mmol/L (ref 3.5–5.1)
Sodium: 137 mmol/L (ref 135–145)
Total Bilirubin: 1.9 mg/dL — ABNORMAL HIGH (ref <1.2)
Total Protein: 6.5 g/dL (ref 6.5–8.1)

## 2023-08-26 MED ORDER — SODIUM CHLORIDE 0.9% IV SOLUTION
Freq: Once | INTRAVENOUS | Status: AC
Start: 2023-08-26 — End: 2023-08-26

## 2023-08-26 NOTE — Plan of Care (Signed)

## 2023-08-26 NOTE — Progress Notes (Signed)
Date and time results received: 08/26/23 0634   Test: Hgb Critical Value: 5.5  Name of Provider Notified: Chinita Greenland NP

## 2023-08-26 NOTE — Plan of Care (Signed)

## 2023-08-27 ENCOUNTER — Inpatient Hospital Stay (HOSPITAL_COMMUNITY): Payer: 59

## 2023-08-27 DIAGNOSIS — D5700 Hb-SS disease with crisis, unspecified: Secondary | ICD-10-CM | POA: Diagnosis not present

## 2023-08-27 LAB — CBC WITH DIFFERENTIAL/PLATELET
Abs Immature Granulocytes: 0.07 10*3/uL (ref 0.00–0.07)
Basophils Absolute: 0 10*3/uL (ref 0.0–0.1)
Basophils Relative: 0 %
Eosinophils Absolute: 0 10*3/uL (ref 0.0–0.5)
Eosinophils Relative: 0 %
HCT: 16.5 % — ABNORMAL LOW (ref 39.0–52.0)
Hemoglobin: 6.2 g/dL — CL (ref 13.0–17.0)
Immature Granulocytes: 1 %
Lymphocytes Relative: 18 %
Lymphs Abs: 1.4 10*3/uL (ref 0.7–4.0)
MCH: 30.8 pg (ref 26.0–34.0)
MCHC: 37.6 g/dL — ABNORMAL HIGH (ref 30.0–36.0)
MCV: 82.1 fL (ref 80.0–100.0)
Monocytes Absolute: 1.2 10*3/uL — ABNORMAL HIGH (ref 0.1–1.0)
Monocytes Relative: 15 %
Neutro Abs: 5.1 10*3/uL (ref 1.7–7.7)
Neutrophils Relative %: 66 %
Platelets: 96 10*3/uL — ABNORMAL LOW (ref 150–400)
RBC: 2.01 MIL/uL — ABNORMAL LOW (ref 4.22–5.81)
RDW: 21.6 % — ABNORMAL HIGH (ref 11.5–15.5)
WBC: 7.8 10*3/uL (ref 4.0–10.5)
nRBC: 16.6 % — ABNORMAL HIGH (ref 0.0–0.2)

## 2023-08-27 LAB — COMPREHENSIVE METABOLIC PANEL
ALT: 28 U/L (ref 0–44)
AST: 75 U/L — ABNORMAL HIGH (ref 15–41)
Albumin: 3.2 g/dL — ABNORMAL LOW (ref 3.5–5.0)
Alkaline Phosphatase: 172 U/L — ABNORMAL HIGH (ref 38–126)
Anion gap: 5 (ref 5–15)
BUN: 51 mg/dL — ABNORMAL HIGH (ref 6–20)
CO2: 18 mmol/L — ABNORMAL LOW (ref 22–32)
Calcium: 8.7 mg/dL — ABNORMAL LOW (ref 8.9–10.3)
Chloride: 118 mmol/L — ABNORMAL HIGH (ref 98–111)
Creatinine, Ser: 2.54 mg/dL — ABNORMAL HIGH (ref 0.61–1.24)
GFR, Estimated: 29 mL/min — ABNORMAL LOW (ref >60)
Glucose, Bld: 108 mg/dL — ABNORMAL HIGH (ref 70–99)
Potassium: 5 mmol/L (ref 3.5–5.1)
Sodium: 141 mmol/L (ref 135–145)
Total Bilirubin: 2.6 mg/dL — ABNORMAL HIGH (ref <1.2)
Total Protein: 6.1 g/dL — ABNORMAL LOW (ref 6.5–8.1)

## 2023-08-27 LAB — PREPARE RBC (CROSSMATCH)

## 2023-08-27 MED ORDER — SODIUM CHLORIDE 0.45 % IV SOLN: INTRAVENOUS | Status: AC

## 2023-08-27 MED ORDER — SODIUM CHLORIDE 0.9% IV SOLUTION: Freq: Once | INTRAVENOUS | Status: AC

## 2023-08-27 MED ORDER — ACETAMINOPHEN 325 MG PO TABS
650.0000 mg | ORAL_TABLET | ORAL | Status: DC | PRN
  Administered 2023-08-27 – 2023-08-28 (×2): 650 mg via ORAL
  Filled 2023-08-27 (×2): qty 2

## 2023-08-27 MED ORDER — DIPHENHYDRAMINE HCL 50 MG/ML IJ SOLN
25.0000 mg | Freq: Once | INTRAMUSCULAR | Status: AC
  Administered 2023-08-27: 25 mg via INTRAVENOUS
  Filled 2023-08-27: qty 1

## 2023-08-27 MED ORDER — ACETAMINOPHEN 325 MG PO TABS
650.0000 mg | ORAL_TABLET | Freq: Once | ORAL | Status: AC
  Administered 2023-08-27: 650 mg via ORAL
  Filled 2023-08-27: qty 2

## 2023-08-27 NOTE — Progress Notes (Signed)
Subjective: Scott Norton is a 57 year old male with a medical history significant for sickle cell disease, diastolic CHF, history of cocaine abuse, depression with anxiety, and anemia of chronic disease that was admitted for community-acquired pneumonia in the setting of sickle cell pain crisis. Patient states that pain intensity is 5/10 primarily to chest and lower extremities.  Patient states that pain intensity improved overnight. On admission, patient's hemoglobin was 5.8 g/dL.  He is status post 2 units PRBCs. He denies any dizziness, headache, shortness of breath, urinary symptoms, nausea, vomiting, or diarrhea.  Objective:  Vital signs in last 24 hours:  Vitals:   08/27/23 1000 08/27/23 1305 08/27/23 1345 08/27/23 1642  BP: (!) 119/59  (!) 117/49   Pulse: 83  92   Resp: 20 18 20 15   Temp: 100 F (37.8 C)  (!) 100.4 F (38 C)   TempSrc: Oral  Oral   SpO2: (!) 78% 92% 90% 90%  Weight:      Height:        Intake/Output from previous day:   Intake/Output Summary (Last 24 hours) at 08/27/2023 1716 Last data filed at 08/27/2023 1506 Gross per 24 hour  Intake 780.58 ml  Output 1700 ml  Net -919.42 ml    Physical Exam: General: Alert, awake, oriented x3, in no acute distress.  HEENT: [place]/AT [place]ER[place], EOMI Neck: Trachea midline,  no masses, no thyromegal,y no JVD, no carotid bruit OROPHARYNX:  Moist, No exudate/ erythema/lesions.  Heart: Regular rate and rhythm, without murmurs, rubs, gallops, PMI non-displaced, no heaves or thrills on palpation.  Lungs: Clear to auscultation, no wheezing or rhonchi noted. No increased vocal fremitus resonant to percussion  Abdomen: Soft, nontender, nondistended, positive bowel sounds, no masses no hepatosplenomegaly noted..  Neuro: No focal neurological deficits noted cranial nerves II through XII grossly intact. DTRs 2+ bilaterally upper and lower extremities. Strength 5 out of 5 in bilateral upper and lower extremities. Musculoskeletal: No warm  swelling or erythema around joints, no spinal tenderness noted. Psychiatric: Patient alert and oriented x3, good insight and cognition, good recent to remote recall. Lymph node survey: No cervical axillary or inguinal lymphadenopathy noted.  Lab Results:  Basic Metabolic Panel:    Component Value Date/Time   NA 141 08/27/2023 1408   NA 150 (H) 06/25/2023 1119   K 5.0 08/27/2023 1408   CL 118 (H) 08/27/2023 1408   CO2 18 (L) 08/27/2023 1408   BUN 51 (H) 08/27/2023 1408   BUN 24 06/25/2023 1119   CREATININE 2.54 (H) 08/27/2023 1408   CREATININE 0.90 07/19/2014 1128   GLUCOSE 108 (H) 08/27/2023 1408   CALCIUM 8.7 (L) 08/27/2023 1408   CBC:    Component Value Date/Time   WBC 7.8 08/27/2023 1408   HGB 6.2 (LL) 08/27/2023 1408   HGB 6.5 (LL) 06/25/2023 1119   HCT 16.5 (L) 08/27/2023 1408   HCT 19.4 (L) 06/25/2023 1119   PLT 96 (L) 08/27/2023 1408   PLT 146 (L) 06/25/2023 1119   MCV 82.1 08/27/2023 1408   MCV 98 (H) 06/25/2023 1119   NEUTROABS 5.1 08/27/2023 1408   NEUTROABS 3.6 06/25/2023 1119   LYMPHSABS 1.4 08/27/2023 1408   LYMPHSABS 2.4 06/25/2023 1119   MONOABS 1.2 (H) 08/27/2023 1408   EOSABS 0.0 08/27/2023 1408   EOSABS 0.2 06/25/2023 1119   BASOSABS 0.0 08/27/2023 1408   BASOSABS 0.0 06/25/2023 1119    No results found for this or any previous visit (from the past 240 hour(s)).  Studies/Results:  No results found.  Medications: Scheduled Meds:  sodium chloride   Intravenous Once   acetaminophen  650 mg Oral Once   diphenhydrAMINE  25 mg Intravenous Once   HYDROmorphone   Intravenous Q4H   nicotine  21 mg Transdermal Daily   senna-docusate  1 tablet Oral BID   Continuous Infusions:  sodium chloride 100 mL/hr at 08/27/23 1445   azithromycin 250 mL/hr at 08/27/23 1506   cefTRIAXone (ROCEPHIN)  IV 1 g (08/27/23 1638)   PRN Meds:.acetaminophen, diphenhydrAMINE, naloxone **AND** sodium chloride flush, ondansetron, polyethylene  glycol  Consultants: none  Procedures: none  Antibiotics: Ceftriaxone azithromycin  Assessment/Plan: Principal Problem:   Sickle cell anemia with crisis (HCC) Active Problems:   Tobacco use   Thrombocytopenia (HCC)   AKI (acute kidney injury) (HCC)   Cocaine abuse with cocaine-induced mood disorder (HCC)   Acute hypoxemic respiratory failure (HCC)   Malnutrition of moderate degree   Acute chest syndrome (HCC)  Sickle cell disease with probable acute chest syndrome: Continue IV Dilaudid PCA Continue IV antibiotics Toradol 15 mg IV every 6 hours Reduce IV fluids to [facility] Monitor vital signs very closely, reevaluate pain scale regularly, and supplemental oxygen as needed.  Acute on chronic anemia: Patient's hemoglobin is below baseline.  He was transfused 2 units PRBCs.  Awaiting lab results.  History of cocaine abuse: UDS negative for cocaine  Acute kidney injury: Baseline creatinine is 1.2-1.3.  Improving.  Continue gentle hydration.  Chronic pain syndrome: Will restart home medications as pain intensity improves  Tobacco abuse: Counseling provided.  Continue nicotine patches.  Acute hypoxic respiratory failure: Secondary to CAP.  Titrate oxygen as tolerated.  Incentive spirometer.  Monitor closely.  Hb Sickle Cell Disease with Pain crisis: Continue IVF D5 .45% Saline @ 125 mls/hour, continue weight based Dilaudid PCA, IV Toradol 30 mg Q 6 H, Monitor vitals very closely, Re-evaluate pain scale regularly, 2 L of Oxygen by [place]. Leukocytosis:  Anemia of Chronic Disease:  Chronic pain Syndrome:  Medication non-compliance  Code Status: Full Code Family Communication: N/A Disposition Plan: Not yet ready for discharge  Rever Pichette Rennis Petty  APRN, MSN, FNP-C [facility] [address] [place], [place] [address] [phone]  If 7PM-7AM, please contact night-coverage.  08/27/2023, 5:16 PM  LOS: 2 days

## 2023-08-28 ENCOUNTER — Other Ambulatory Visit (HOSPITAL_COMMUNITY): Payer: Self-pay

## 2023-08-28 DIAGNOSIS — D5700 Hb-SS disease with crisis, unspecified: Secondary | ICD-10-CM | POA: Diagnosis not present

## 2023-08-28 LAB — TYPE AND SCREEN
ABO/RH(D): O POS
Antibody Screen: NEGATIVE
Donor AG Type: NEGATIVE
Donor AG Type: NEGATIVE
Donor AG Type: NEGATIVE
Unit division: 0
Unit division: 0
Unit division: 0

## 2023-08-28 LAB — BASIC METABOLIC PANEL
Anion gap: 8 (ref 5–15)
BUN: 45 mg/dL — ABNORMAL HIGH (ref 6–20)
CO2: 17 mmol/L — ABNORMAL LOW (ref 22–32)
Calcium: 8.8 mg/dL — ABNORMAL LOW (ref 8.9–10.3)
Chloride: 114 mmol/L — ABNORMAL HIGH (ref 98–111)
Creatinine, Ser: 2.14 mg/dL — ABNORMAL HIGH (ref 0.61–1.24)
GFR, Estimated: 35 mL/min — ABNORMAL LOW (ref >60)
Glucose, Bld: 94 mg/dL (ref 70–99)
Potassium: 5.3 mmol/L — ABNORMAL HIGH (ref 3.5–5.1)
Sodium: 139 mmol/L (ref 135–145)

## 2023-08-28 LAB — CBC
HCT: 22.4 % — ABNORMAL LOW (ref 39.0–52.0)
Hemoglobin: 8.2 g/dL — ABNORMAL LOW (ref 13.0–17.0)
MCH: 31.2 pg (ref 26.0–34.0)
MCHC: 36.6 g/dL — ABNORMAL HIGH (ref 30.0–36.0)
MCV: 85.2 fL (ref 80.0–100.0)
Platelets: 101 10*3/uL — ABNORMAL LOW (ref 150–400)
RBC: 2.63 MIL/uL — ABNORMAL LOW (ref 4.22–5.81)
RDW: 21.1 % — ABNORMAL HIGH (ref 11.5–15.5)
WBC: 7.4 10*3/uL (ref 4.0–10.5)
nRBC: 17.7 % — ABNORMAL HIGH (ref 0.0–0.2)

## 2023-08-28 LAB — BPAM RBC
Blood Product Expiration Date: 202411142359
Blood Product Expiration Date: 202411262359
Blood Product Expiration Date: 202412052359
ISSUE DATE / TIME: 202411032059
ISSUE DATE / TIME: 202411040803
ISSUE DATE / TIME: 202411051831
Unit Type and Rh: 5100
Unit Type and Rh: 5100
Unit Type and Rh: 5100

## 2023-08-28 MED ORDER — AZITHROMYCIN 250 MG PO TABS
500.0000 mg | ORAL_TABLET | Freq: Every day | ORAL | Status: DC
  Administered 2023-08-28: 500 mg via ORAL
  Filled 2023-08-28: qty 2

## 2023-08-28 NOTE — Plan of Care (Signed)
  Problem: Education: Goal: Knowledge of General Education information will improve Description Including pain rating scale, medication(s)/side effects and non-pharmacologic comfort measures Outcome: Progressing   Problem: Health Behavior/Discharge Planning: Goal: Ability to manage health-related needs will improve Outcome: Progressing   

## 2023-08-28 NOTE — Plan of Care (Signed)

## 2023-08-29 ENCOUNTER — Other Ambulatory Visit (HOSPITAL_COMMUNITY): Payer: Self-pay

## 2023-08-29 DIAGNOSIS — D5700 Hb-SS disease with crisis, unspecified: Secondary | ICD-10-CM | POA: Diagnosis not present

## 2023-08-29 MED ORDER — AMOXICILLIN-POT CLAVULANATE 875-125 MG PO TABS
1.0000 | ORAL_TABLET | Freq: Two times a day (BID) | ORAL | 0 refills | Status: AC
  Filled 2023-08-29: qty 14, 7d supply, fill #0

## 2023-08-29 NOTE — Progress Notes (Signed)
The patient is stable. No changes from am assessment. He is alert, oriented x4, ambulatory without assistance. Discharge instructions reviewed. Questions, concerns were denied at this time. Medications were delivered from the pharmacy to the bedside.  Dilaudid 18 mg/mls wasted in pyxis

## 2023-08-29 NOTE — Progress Notes (Signed)
Subjective: Scott Norton is a 57 year old male with a medical history significant for sickle cell disease, diastolic CHF, history of cocaine abuse, depression with anxiety, and anemia of chronic disease that was admitted for community-acquired pneumonia in the setting of sickle cell pain crisis. Patient states that pain intensity is 5/10 primarily to chest and lower extremities.  Patient states that pain intensity improved overnight. He denies any dizziness, headache, shortness of breath, urinary symptoms, nausea, vomiting, or diarrhea.  Objective:  Vital signs in last 24 hours:  Vitals:   08/29/23 0505 08/29/23 0649 08/29/23 0819 08/29/23 1238  BP:  (!) 93/55  105/61  Pulse:  (!) 52  (!) 58  Resp: 14  18 16   Temp:  97.7 F (36.5 C)    TempSrc:  Oral    SpO2: 100% 98% 94% 95%  Weight:      Height:        Intake/Output from previous day:   Intake/Output Summary (Last 24 hours) at 08/29/2023 1705 Last data filed at 08/29/2023 1200 Gross per 24 hour  Intake 600 ml  Output 975 ml  Net -375 ml    Physical Exam: General: Alert, awake, oriented x3, in no acute distress.  HEENT: [place]/AT [place]ER[place], EOMI Neck: Trachea midline,  no masses, no thyromegal,y no JVD, no carotid bruit OROPHARYNX:  Moist, No exudate/ erythema/lesions.  Heart: Regular rate and rhythm, without murmurs, rubs, gallops, PMI non-displaced, no heaves or thrills on palpation.  Lungs: Clear to auscultation, no wheezing or rhonchi noted. No increased vocal fremitus resonant to percussion  Abdomen: Soft, nontender, nondistended, positive bowel sounds, no masses no hepatosplenomegaly noted..  Neuro: No focal neurological deficits noted cranial nerves II through XII grossly intact. DTRs 2+ bilaterally upper and lower extremities. Strength 5 out of 5 in bilateral upper and lower extremities. Musculoskeletal: No warm swelling or erythema around joints, no spinal tenderness noted. Psychiatric: Patient alert and oriented x3, good  insight and cognition, good recent to remote recall. Lymph node survey: No cervical axillary or inguinal lymphadenopathy noted.  Lab Results:  Basic Metabolic Panel:    Component Value Date/Time   NA 139 08/28/2023 0538   NA 150 (H) 06/25/2023 1119   K 5.3 (H) 08/28/2023 0538   CL 114 (H) 08/28/2023 0538   CO2 17 (L) 08/28/2023 0538   BUN 45 (H) 08/28/2023 0538   BUN 24 06/25/2023 1119   CREATININE 2.14 (H) 08/28/2023 0538   CREATININE 0.90 07/19/2014 1128   GLUCOSE 94 08/28/2023 0538   CALCIUM 8.8 (L) 08/28/2023 0538   CBC:    Component Value Date/Time   WBC 7.4 08/28/2023 0538   HGB 8.2 (L) 08/28/2023 0538   HGB 6.5 (LL) 06/25/2023 1119   HCT 22.4 (L) 08/28/2023 0538   HCT 19.4 (L) 06/25/2023 1119   PLT 101 (L) 08/28/2023 0538   PLT 146 (L) 06/25/2023 1119   MCV 85.2 08/28/2023 0538   MCV 98 (H) 06/25/2023 1119   NEUTROABS 5.1 08/27/2023 1408   NEUTROABS 3.6 06/25/2023 1119   LYMPHSABS 1.4 08/27/2023 1408   LYMPHSABS 2.4 06/25/2023 1119   MONOABS 1.2 (H) 08/27/2023 1408   EOSABS 0.0 08/27/2023 1408   EOSABS 0.2 06/25/2023 1119   BASOSABS 0.0 08/27/2023 1408   BASOSABS 0.0 06/25/2023 1119    No results found for this or any previous visit (from the past 240 hour(s)).  Studies/Results: No results found.  Medications: Scheduled Meds:  azithromycin  500 mg Oral q1800   HYDROmorphone  Intravenous Q4H   nicotine  21 mg Transdermal Daily   senna-docusate  1 tablet Oral BID   Continuous Infusions:  cefTRIAXone (ROCEPHIN)  IV 1 g (08/28/23 1643)   PRN Meds:.acetaminophen, diphenhydrAMINE, naloxone **AND** sodium chloride flush, ondansetron, polyethylene glycol  Consultants: none  Procedures: none  Antibiotics: Ceftriaxone azithromycin  Assessment/Plan: Principal Problem:   Sickle cell anemia with crisis (HCC) Active Problems:   Tobacco use   Thrombocytopenia (HCC)   AKI (acute kidney injury) (HCC)   Cocaine abuse with cocaine-induced mood  disorder (HCC)   Acute hypoxemic respiratory failure (HCC)   Malnutrition of moderate degree   Acute chest syndrome (HCC)  Sickle cell disease with probable acute chest syndrome: Continue IV Dilaudid PCA Continue IV antibiotics Toradol 15 mg IV every 6 hours Reduce IV fluids to [facility] Monitor vital signs very closely, reevaluate pain scale regularly, and supplemental oxygen as needed.  Acute on chronic anemia: Patient's hemoglobin stable and consistent with baseline. No clinical indication for additional blood transfusion.  CBC in am  History of cocaine abuse: UDS negative for cocaine  Acute kidney injury: Baseline creatinine is 1.2-1.3.  Improving.  Continue gentle hydration.  Chronic pain syndrome: Will restart home medications as pain intensity improves  Tobacco abuse: Counseling provided.  Continue nicotine patches.  Acute hypoxic respiratory failure: Secondary to CAP.  Oxygen saturation much improved, continue supplemental oxygen as needed.  Monitor closely. Code Status: Full Code Family Communication: N/A Disposition Plan: Not yet ready for discharge  Schyler Counsell Rennis Petty  APRN, MSN, FNP-C [facility] [address] [place], [place] [address] [phone]  If 7PM-7AM, please contact night-coverage.  08/29/2023, 5:05 PM  LOS: 4 days

## 2023-08-29 NOTE — Discharge Summary (Signed)
Physician Discharge Summary  Scott Norton:[id number] DOB: 1966/09/18 DOA: 08/25/2023  PCP: Massie Maroon, FNP  Admit date: 08/25/2023  Discharge date: 08/29/2023  Discharge Diagnoses:  Principal Problem:   Sickle cell anemia with crisis [facility]) Active Problems:   Tobacco use   Thrombocytopenia (HCC)   AKI (acute kidney injury) (HCC)   Cocaine abuse with cocaine-induced mood disorder (HCC)   Acute hypoxemic respiratory failure (HCC)   Malnutrition of moderate degree   Acute chest syndrome [facility])   Discharge Condition: Stable  Disposition:   Follow-up Information     Massie Maroon, FNP Follow up in 1 week(s).   Specialty: Family Medicine Contact information: [address] [place] [address] [phone]                Pt is discharged home in good condition and is to follow up with Massie Maroon, FNP this week to have labs evaluated. Scott Norton is instructed to increase activity slowly and balance with rest for the next few days, and use prescribed medication to complete treatment of pain  Diet: Regular Wt Readings from Last 3 Encounters:  08/25/23 70 kg  06/25/23 73.5 kg  03/26/23 75.1 kg    History of present illness:  Scott Norton is a 57 year old male with a medical history significant for sickle cell disease, diastolic CHF, history of cocaine abuse, depression with anxiety, and anemia of chronic disease who presented to the ER with a complaint of pain all over that is consistent with his typical sickle cell crisis.  Patient reports having pain all over for about a week.  He initially took his prescribed medications, which were ineffective.  Today he did not take any.  He came to the ER, where he was seen and evaluated.  His hemoglobin is noted to be down to 5.6.  His typical range, between 6.5-8.  Also, thrombocytopenia.  Patient has had oxygen saturation 79% on RA.  He has had previous hypoxic respiratory failure requiring home  oxygen.  Patient was unable to get him before due to being homeless.  His chest x-ray showed right greater than left lower lobe consolidation concerning for acute chest syndrome on pneumonia.  At this point patient appears to have acute sickle cell crisis with acute chest syndrome.  He will be admitted to the hospital for further evaluation and treatment  Hospital Course:  Sickle cell disease with pain crisis: Patient was admitted for sickle cell pain crisis and managed appropriately with IVF, IV Dilaudid via PCA and IV Toradol, as well as other adjunct therapies per sickle cell pain management protocols.  IV Dilaudid PCA titrated appropriately.  Patient transition to his home medications.  He will follow-up with his PCP for medication management.  Symptomatic anemia: Patient's hemoglobin was decreased below baseline at 5.5 on admission.  He is status post 3 units PRBCs.  Patient's hemoglobin is 8.2 prior to discharge  Acute on chronic kidney disease: Creatinine increased below baseline.  Considered to be secondary to dehydration.  Improved throughout admission.  2.14 today.  Patient will follow-up with PCP within 1 week to repeat CBC with differential and CMP.  CAP/acute hypoxia with respiratory failure: Patient will complete treatment with Augmentin 875-125 mg every 12 hours for a total of 7 days. Oxygen saturation above 90% on room air.  No need for home oxygen at this time  Patient was therefore discharged home today in a hemodynamically stable condition.   Scott Norton  will follow-up with PCP within 1 week of this discharge. Scott Norton was counseled extensively about nonpharmacologic means of pain management, patient verbalized understanding and was appreciative of  the care received during this admission.   We discussed the need for good hydration, monitoring of hydration status, avoidance of heat, cold, stress, and infection triggers.  Patient was reminded of the need to seek medical attention  immediately if any symptom of bleeding, anemia, or infection occurs.  Discharge Exam: Vitals:   08/29/23 0649 08/29/23 0819  BP: (!) 93/55   Pulse: (!) 52   Resp:  18  Temp: 97.7 F (36.5 C)   SpO2: 98% 94%   Vitals:   08/28/23 2334 08/29/23 0505 08/29/23 0649 08/29/23 0819  BP:   (!) 93/55   Pulse:   (!) 52   Resp: 14 14  18   Temp:   97.7 F (36.5 C)   TempSrc:   Oral   SpO2: 96% 100% 98% 94%  Weight:      Height:        General appearance : Awake, alert, not in any distress. Speech Clear. Not toxic looking HEENT: Atraumatic and Normocephalic, pupils equally reactive to light and accomodation Neck: Supple, no JVD. No cervical lymphadenopathy.  Chest: Good air entry bilaterally, no added sounds  CVS: S1 S2 regular, no murmurs.  Abdomen: Bowel sounds present, Non tender and not distended with no gaurding, rigidity or rebound. Extremities: B/L Lower Ext shows no edema, both legs are warm to touch Neurology: Awake alert, and oriented X 3, CN II-XII intact, Non focal Skin: No Rash  Discharge Instructions  Discharge Instructions     Discharge patient   Complete by: As directed    Discharge disposition: 01-Home or Self Care   Discharge patient date: 08/29/2023      Allergies as of 08/29/2023       Reactions   Aspirin Other (See Comments)   Increased heart rate   Tape Rash   Tramadol Hives, Itching        Medication List     TAKE these medications    amoxicillin-clavulanate 875-125 MG tablet Commonly known as: AUGMENTIN Take 1 tablet by mouth 2 (two) times daily for 7 days.   folic acid 1 MG tablet Commonly known as: FOLVITE Take 1 tablet (1 mg) by mouth daily.   ibuprofen 800 MG tablet Commonly known as: ADVIL Take 1 tablet by mouth every 8 hours as needed. What changed:  when to take this reasons to take this   oxyCODONE-acetaminophen 10-325 MG tablet Commonly known as: Percocet Take 1 tablet by mouth every 6 (six) hours as needed for up to 15  days for pain.        The results of significant diagnostics from this hospitalization (including imaging, microbiology, ancillary and laboratory) are listed below for reference.    Significant Diagnostic Studies: DG Chest 2 View  Result Date: 08/27/2023 CLINICAL DATA:  Sickle cell crisis, pain EXAM: CHEST - 2 VIEW COMPARISON:  09/11/2022 FINDINGS: Frontal and lateral views of the chest demonstrates stable enlargement the cardiac silhouette. Chronic background scarring throughout the lungs consistent with history of sickle cell disease. Superimposed areas of consolidation are seen at the lung bases, left greater than right, compatible with acute chest syndrome. Trace right pleural effusion. No pneumothorax. No acute bony abnormalities. IMPRESSION: 1. Chronic background scarring, with superimposed bibasilar consolidation and right pleural effusion consistent with acute chest syndrome or pneumonia. Electronically Signed   By: Maxwell Caul.D.  On: 08/27/2023 18:59    Microbiology: No results found for this or any previous visit (from the past 240 hour(s)).   Labs: Basic Metabolic Panel: Recent Labs  Lab 08/25/23 1403 08/26/23 0526 08/27/23 1408 08/28/23 0538  NA 136 137 141 139  K 5.0 5.0 5.0 5.3*  CL 108 109 118* 114*  CO2 18* 18* 18* 17*  GLUCOSE 97 99 108* 94  BUN 49* 52* 51* 45*  CREATININE 2.74* 3.32* 2.54* 2.14*  CALCIUM 9.1 8.3* 8.7* 8.8*   Liver Function Tests: Recent Labs  Lab 08/25/23 1403 08/26/23 0526 08/27/23 1408  AST 109* 93* 75*  ALT 39 36 28  ALKPHOS 171* 150* 172*  BILITOT 2.4* 1.9* 2.6*  PROT 7.2 6.5 6.1*  ALBUMIN 4.0 3.7 3.2*   No results for input(s): "LIPASE", "AMYLASE" in the last 168 hours. No results for input(s): "AMMONIA" in the last 168 hours. CBC: Recent Labs  Lab 08/25/23 1403 08/26/23 0526 08/27/23 1408 08/28/23 0538  WBC 6.6 8.4 7.8 7.4  NEUTROABS 2.7 3.6 5.1  --   HGB 5.4* 5.5* 6.2* 8.2*  HCT 14.0* 14.5* 16.5* 22.4*  MCV  82.8 81.9 82.1 85.2  PLT 110* 100* 96* 101*   Cardiac Enzymes: No results for input(s): "CKTOTAL", "CKMB", "CKMBINDEX", "TROPONINI" in the last 168 hours. BNP: Invalid input(s): "POCBNP" CBG: No results for input(s): "GLUCAP" in the last 168 hours.  Time coordinating discharge: 50 minutes  Signed: [name]  APRN, MSN, FNP-C Patient Care [facility] [address] [place], [place] [address] [phone]  [facility] 08/29/2023, 12:35 PM

## 2023-09-08 ENCOUNTER — Other Ambulatory Visit: Payer: Self-pay | Admitting: Family Medicine

## 2023-09-08 DIAGNOSIS — G894 Chronic pain syndrome: Secondary | ICD-10-CM

## 2023-09-08 DIAGNOSIS — D571 Sickle-cell disease without crisis: Secondary | ICD-10-CM

## 2023-09-09 ENCOUNTER — Other Ambulatory Visit (HOSPITAL_COMMUNITY): Payer: Self-pay

## 2023-09-09 MED ORDER — OXYCODONE-ACETAMINOPHEN 10-325 MG PO TABS
1.0000 | ORAL_TABLET | Freq: Four times a day (QID) | ORAL | 0 refills | Status: DC | PRN
  Filled 2023-09-12: qty 60, 15d supply, fill #0

## 2023-09-12 ENCOUNTER — Other Ambulatory Visit (HOSPITAL_COMMUNITY): Payer: Self-pay

## 2023-09-23 ENCOUNTER — Other Ambulatory Visit: Payer: Self-pay | Admitting: Family Medicine

## 2023-09-23 ENCOUNTER — Other Ambulatory Visit (HOSPITAL_COMMUNITY): Payer: Self-pay

## 2023-09-23 DIAGNOSIS — G894 Chronic pain syndrome: Secondary | ICD-10-CM

## 2023-09-23 DIAGNOSIS — D571 Sickle-cell disease without crisis: Secondary | ICD-10-CM

## 2023-09-23 MED ORDER — OXYCODONE-ACETAMINOPHEN 10-325 MG PO TABS
1.0000 | ORAL_TABLET | Freq: Four times a day (QID) | ORAL | 0 refills | Status: DC | PRN
  Filled 2023-09-23: qty 60, 15d supply, fill #0
  Filled ????-??-??: fill #0

## 2023-09-23 MED ORDER — IBUPROFEN 800 MG PO TABS
800.0000 mg | ORAL_TABLET | Freq: Three times a day (TID) | ORAL | 1 refills | Status: DC | PRN
  Filled 2023-09-23: qty 60, 20d supply, fill #0
  Filled 2023-11-17: qty 60, 20d supply, fill #1

## 2023-09-24 ENCOUNTER — Encounter: Payer: Self-pay | Admitting: Family Medicine

## 2023-09-24 ENCOUNTER — Ambulatory Visit: Payer: 59 | Admitting: Family Medicine

## 2023-09-24 ENCOUNTER — Other Ambulatory Visit (HOSPITAL_COMMUNITY): Payer: Self-pay

## 2023-09-24 VITALS — BP 133/68 | HR 59 | Temp 97.3°F | Wt 164.0 lb

## 2023-09-24 DIAGNOSIS — Z720 Tobacco use: Secondary | ICD-10-CM

## 2023-09-24 DIAGNOSIS — D571 Sickle-cell disease without crisis: Secondary | ICD-10-CM

## 2023-09-24 DIAGNOSIS — G894 Chronic pain syndrome: Secondary | ICD-10-CM

## 2023-09-24 DIAGNOSIS — E559 Vitamin D deficiency, unspecified: Secondary | ICD-10-CM

## 2023-09-24 MED ORDER — OXYCODONE-ACETAMINOPHEN 10-325 MG PO TABS
1.0000 | ORAL_TABLET | Freq: Four times a day (QID) | ORAL | 0 refills | Status: DC | PRN
  Filled 2023-09-26: qty 60, 15d supply, fill #0

## 2023-09-24 NOTE — Progress Notes (Signed)
Established Patient Office Visit  Subjective   Patient ID: Scott Norton, male    DOB: 1965/10/30  Age: 57 y.o. MRN: [id number]  Chief Complaint  Patient presents with   Sickle Cell Anemia    Scott Norton is a 57 year old male with a medical history significant for sickle cell disease, chronic pain syndrome, opiate dependence, tolerance, and anemia of chronic disease presents for medication management.  Patient says that he is doing well and is without complaint on today.  [name] has well-controlled sickle cell disease with infrequent pain crisis. He is not having any pain at this time.  He has a long history of chronic pain secondary to his sickle cell disease.  Pain has been well-controlled with Percocet 10-325 around every 6 hours as needed.  Patient last had this medication on last night with maximum relief. Patient denies any fever, chills, chest pain, fatigue, or dizziness.  No urinary symptoms, nausea, vomiting, or diarrhea.    Patient Active Problem List   Diagnosis Date Noted   Acute chest syndrome (HCC) 08/25/2023   Weakness generalized 09/13/2022   Malnutrition of moderate degree 09/12/2022   Hemolytic crisis 09/12/2022   Acute hypoxemic respiratory failure (HCC) 06/10/2021   Sickle cell anemia with crisis (HCC) 06/10/2021   Suicidal ideation    Acute exacerbation of CHF (congestive heart failure) (HCC) 03/13/2021   Depression 03/01/2021   Cocaine abuse with cocaine-induced mood disorder (HCC) 03/01/2021   Sickle-cell crisis (HCC) 02/08/2021   Hyperkalemia 02/08/2021   Thrombocytopenia (HCC) 02/08/2021   Leukocytosis 02/08/2021   AKI (acute kidney injury) (HCC) 02/08/2021   Cocaine abuse (HCC) 02/08/2021   Homelessness 09/14/2020   Suicidal ideations 09/04/2020   Tobacco use 09/04/2020   Sickle cell pain crisis (HCC) 04/11/2015   Vitamin D deficiency 01/31/2015   Chronic pain 06/30/2014   Respiratory infection 08/13/2012   Healthcare maintenance 06/04/2012    Cervical strain, acute 12/09/2011   Neck sprain and strain 12/09/2011   Sickle cell anemia (HCC) 11/29/2011   Seasonal allergies 11/29/2011   Seasonal allergic rhinitis 11/29/2011   Past Medical History:  Diagnosis Date   AKI (acute kidney injury) (HCC) 02/08/2021   Anemia    Depression    Heart murmur    Seasonal allergies    wool & grass   Sickle cell anemia (HCC)    Past Surgical History:  Procedure Laterality Date   NO PAST SURGERIES     ROOT CANAL     Social History   Tobacco Use   Smoking status: Some Days    Current packs/day: 0.50    Types: Cigars, Cigarettes   Smokeless tobacco: Never   Tobacco comments:    10 cigars per week.  Vaping Use   Vaping status: Never Used  Substance Use Topics   Alcohol use: Not Currently   Drug use: Not Currently    Types: Marijuana, Cocaine   Social History   Socioeconomic History   Marital status: Single    Spouse name: Not on file   Number of children: Not on file   Years of education: Not on file   Highest education level: Not on file  Occupational History   Not on file  Tobacco Use   Smoking status: Some Days    Current packs/day: 0.50    Types: Cigars, Cigarettes   Smokeless tobacco: Never   Tobacco comments:    10 cigars per week.  Vaping Use   Vaping status: Never Used  Substance  and Sexual Activity   Alcohol use: Not Currently   Drug use: Not Currently    Types: Marijuana, Cocaine   Sexual activity: Yes  Other Topics Concern   Not on file  Social History Narrative   Lives with Friends in [place]. Working on housing for himself   On disability for his SS disease      No children, no stable relationship   Social Drivers of Corporate investment banker Strain: Not on file  Food Insecurity: No Food Insecurity (08/27/2023)   Hunger Vital Sign    Worried About Running Out of Food in the Last Year: Never true    Ran Out of Food in the Last Year: Never true  Transportation Needs: No Transportation Needs  (08/27/2023)   PRAPARE - Administrator, Civil Service (Medical): No    Lack of Transportation (Non-Medical): No  Physical Activity: Not on file  Stress: Not on file  Social Connections: Not on file  Intimate Partner Violence: Not At Risk (08/27/2023)   Humiliation, Afraid, Rape, and Kick questionnaire    Fear of Current or Ex-Partner: No    Emotionally Abused: No    Physically Abused: No    Sexually Abused: No   Family Status  Relation Name Status   Mother  Alive   Father  Deceased   Sister  (Not Specified)   Brother  (Not Specified)   MGM  (Not Specified)   MGF  (Not Specified)   PGM  (Not Specified)   PGF  (Not Specified)  No partnership data on file   Family History  Problem Relation Age of Onset   Alcohol abuse Mother    Sickle cell trait Mother    Hypertension Mother    Alcohol abuse Father    Sickle cell trait Father    Early death Father        due to alcoholism   Hypertension Father    Hypertension Sister    Hypertension Brother    Hypertension Maternal Grandmother    Hypertension Maternal Grandfather    Hypertension Paternal Grandmother    Hypertension Paternal Grandfather    Allergies  Allergen Reactions   Aspirin Other (See Comments)    Increased heart rate   Tape Rash   Tramadol Hives and Itching    Review of Systems  Constitutional: Negative.   HENT: Negative.    Eyes: Negative.   Respiratory: Negative.    Cardiovascular: Negative.   Gastrointestinal: Negative.   Genitourinary: Negative.   Musculoskeletal:  Positive for back pain and joint pain.  Skin: Negative.   Neurological: Negative.   Psychiatric/Behavioral: Negative.        Objective:     BP 133/68   Pulse (!) 59   Temp (!) 97.3 F (36.3 C)   Wt 164 lb (74.4 kg)   SpO2 96%   BMI 20.50 kg/m  BP Readings from Last 3 Encounters:  09/24/23 133/68  08/29/23 105/61  06/25/23 (!) 120/41   Wt Readings from Last 3 Encounters:  09/24/23 164 lb (74.4 kg)  08/25/23  154 lb 5.2 oz (70 kg)  06/25/23 162 lb (73.5 kg)      Physical Exam Constitutional:      Appearance: Normal appearance.  Eyes:     Pupils: Pupils are equal, round, and reactive to light.  Cardiovascular:     Rate and Rhythm: Normal rate and regular rhythm.     Pulses: Normal pulses.  Abdominal:     General:  Bowel sounds are normal.  Musculoskeletal:        General: Normal range of motion.  Skin:    General: Skin is warm.  Psychiatric:        Mood and Affect: Mood normal.        Behavior: Behavior normal.        Thought Content: Thought content normal.      Results for orders placed or performed in visit on 09/24/23  Microscopic Examination  Result Value Ref Range   WBC, UA None seen 0 - 5 /hpf   RBC, Urine None seen 0 - 2 /hpf   Epithelial Cells (non renal) None seen 0 - 10 /hpf   Casts None seen None seen /lpf   Bacteria, UA None seen None seen/Few  CBC  Result Value Ref Range   WBC 5.7 3.4 - 10.8 x10E3/uL   RBC 2.80 (L) 4.14 - 5.80 x10E6/uL   Hemoglobin 8.7 (L) 13.0 - 17.7 g/dL   Hematocrit 14.7 (L) 82.9 - 51.0 %   MCV 94 79 - 97 fL   MCH 31.1 26.6 - 33.0 pg   MCHC 33.1 31.5 - 35.7 g/dL   RDW 56.2 (H) 13.0 - 86.5 %   Platelets 164 150 - 450 x10E3/uL   NRBC 9 (H) 0 - 0 %  Drug Screen 10 W/Conf, Serum  Result Value Ref Range   Amphetamines, IA CANCELED ng/mL   Barbiturates, IA CANCELED    Benzodiazepines, IA CANCELED    Cocaine & Metabolite, IA CANCELED    Phencyclidine, IA CANCELED    THC(Marijuana) Metabolite, IA CANCELED    Opiates, IA CANCELED    Oxycodones, IA CANCELED    Methadone, IA CANCELED    Propoxyphene, IA CANCELED   Urinalysis, Routine w reflex microscopic  Result Value Ref Range   Specific Gravity, UA 1.011 1.005 - 1.030   pH, UA 6.0 5.0 - 7.5   Color, UA Yellow Yellow   Appearance Ur Clear Clear   Leukocytes,UA Negative Negative   Protein,UA 1+ (A) Negative/Trace   Glucose, UA Negative Negative   Ketones, UA Negative Negative   RBC,  UA Negative Negative   Bilirubin, UA Negative Negative   Urobilinogen, Ur 0.2 0.2 - 1.0 mg/dL   Nitrite, UA Negative Negative   Microscopic Examination See below:     Last CBC Lab Results  Component Value Date   WBC 5.7 09/24/2023   HGB 8.7 (L) 09/24/2023   HCT 26.3 (L) 09/24/2023   MCV 94 09/24/2023   MCH 31.1 09/24/2023   RDW 19.6 (H) 09/24/2023   PLT 164 09/24/2023   Last metabolic panel Lab Results  Component Value Date   GLUCOSE 94 08/28/2023   NA 139 08/28/2023   K 5.3 (H) 08/28/2023   CL 114 (H) 08/28/2023   CO2 17 (L) 08/28/2023   BUN 45 (H) 08/28/2023   CREATININE 2.14 (H) 08/28/2023   GFRNONAA 35 (L) 08/28/2023   CALCIUM 8.8 (L) 08/28/2023   PROT 6.1 (L) 08/27/2023   ALBUMIN 3.2 (L) 08/27/2023   LABGLOB 2.9 06/25/2023   AGRATIO 1.8 03/26/2023   BILITOT 2.6 (H) 08/27/2023   ALKPHOS 172 (H) 08/27/2023   AST 75 (H) 08/27/2023   ALT 28 08/27/2023   ANIONGAP 8 08/28/2023   Last lipids Lab Results  Component Value Date   CHOL 127 03/01/2021   HDL 43 03/01/2021   LDLCALC 66 03/01/2021   TRIG 89 03/01/2021   CHOLHDL 3.0 03/01/2021   Last hemoglobin A1c  Lab Results  Component Value Date   HGBA1C <4.2 (L) 03/01/2021   Last thyroid functions Lab Results  Component Value Date   TSH 1.730 03/01/2021   Last vitamin D Lab Results  Component Value Date   VD25OH 36.1 06/25/2023   Last vitamin B12 and Folate No results found for: "VITAMINB12", "FOLATE"    The ASCVD Risk score (Arnett DK, et al., 2019) failed to calculate for the following reasons:   The valid total cholesterol range is 130 to 320 mg/dL    Assessment & Plan:   Problem List Items Addressed This Visit       Other   Vitamin D deficiency   Tobacco use   Sickle cell anemia (HCC) - Primary   Relevant Orders   CBC (Completed)   Urinalysis, Routine w reflex microscopic (Completed)   Chronic pain   Relevant Orders   Drug Screen 10 W/Conf, Serum (Completed)   1. Sickle cell disease  without crisis (HCC) (Primary) Patient encouraged to continue folic acid 1 mg daily - CBC - Urinalysis, Routine w reflex microscopic  2. Vitamin D deficiency Will review vitamin D level at 38-month follow-up  3. Chronic pain syndrome No medication changes warranted at this time - Drug Screen 10 W/Conf, Serum  4. Tobacco use Patient counseled at length on the dangers of smoking, especially in the setting of sickle cell disease.  Patient to follow-up in 3 months for medication management  [name]  APRN, MSN, FNP-C Patient Care [facility] [address] [place], [place] [address] [phone]

## 2023-09-25 LAB — CBC
Hematocrit: 26.3 % — ABNORMAL LOW (ref 37.5–51.0)
Hemoglobin: 8.7 g/dL — ABNORMAL LOW (ref 13.0–17.7)
MCH: 31.1 pg (ref 26.6–33.0)
MCHC: 33.1 g/dL (ref 31.5–35.7)
MCV: 94 fL (ref 79–97)
NRBC: 9 % — ABNORMAL HIGH (ref 0–0)
Platelets: 164 10*3/uL (ref 150–450)
RBC: 2.8 x10E6/uL — ABNORMAL LOW (ref 4.14–5.80)
RDW: 19.6 % — ABNORMAL HIGH (ref 11.6–15.4)
WBC: 5.7 10*3/uL (ref 3.4–10.8)

## 2023-09-26 ENCOUNTER — Other Ambulatory Visit (HOSPITAL_COMMUNITY): Payer: Self-pay

## 2023-09-27 ENCOUNTER — Other Ambulatory Visit (HOSPITAL_COMMUNITY): Payer: Self-pay

## 2023-10-07 ENCOUNTER — Other Ambulatory Visit: Payer: Self-pay | Admitting: Family Medicine

## 2023-10-07 DIAGNOSIS — G894 Chronic pain syndrome: Secondary | ICD-10-CM

## 2023-10-07 DIAGNOSIS — D571 Sickle-cell disease without crisis: Secondary | ICD-10-CM

## 2023-10-09 ENCOUNTER — Other Ambulatory Visit (HOSPITAL_COMMUNITY): Payer: Self-pay

## 2023-10-09 LAB — MICROSCOPIC EXAMINATION
Bacteria, UA: NONE SEEN
Casts: NONE SEEN /[LPF]
Epithelial Cells (non renal): NONE SEEN /[HPF] (ref 0–10)
RBC, Urine: NONE SEEN /[HPF] (ref 0–2)
WBC, UA: NONE SEEN /[HPF] (ref 0–5)

## 2023-10-09 LAB — URINALYSIS, ROUTINE W REFLEX MICROSCOPIC
Bilirubin, UA: NEGATIVE
Glucose, UA: NEGATIVE
Ketones, UA: NEGATIVE
Leukocytes,UA: NEGATIVE
Nitrite, UA: NEGATIVE
RBC, UA: NEGATIVE
Specific Gravity, UA: 1.011 (ref 1.005–1.030)
Urobilinogen, Ur: 0.2 mg/dL (ref 0.2–1.0)
pH, UA: 6 (ref 5.0–7.5)

## 2023-10-09 LAB — DRUG SCREEN 10 W/CONF, SERUM

## 2023-10-09 MED ORDER — OXYCODONE-ACETAMINOPHEN 10-325 MG PO TABS
1.0000 | ORAL_TABLET | Freq: Four times a day (QID) | ORAL | 0 refills | Status: DC | PRN
  Filled 2023-10-09: qty 60, 15d supply, fill #0

## 2023-10-19 ENCOUNTER — Other Ambulatory Visit: Payer: Self-pay | Admitting: Internal Medicine

## 2023-10-19 DIAGNOSIS — D571 Sickle-cell disease without crisis: Secondary | ICD-10-CM

## 2023-10-19 DIAGNOSIS — G894 Chronic pain syndrome: Secondary | ICD-10-CM

## 2023-10-21 ENCOUNTER — Other Ambulatory Visit (HOSPITAL_COMMUNITY): Payer: Self-pay

## 2023-10-23 ENCOUNTER — Other Ambulatory Visit: Payer: Self-pay | Admitting: Internal Medicine

## 2023-10-23 DIAGNOSIS — D571 Sickle-cell disease without crisis: Secondary | ICD-10-CM

## 2023-10-23 DIAGNOSIS — G894 Chronic pain syndrome: Secondary | ICD-10-CM

## 2023-10-24 ENCOUNTER — Other Ambulatory Visit: Payer: Self-pay | Admitting: Family Medicine

## 2023-10-24 NOTE — Telephone Encounter (Signed)
 Copied from CRM [id number]. Topic: Clinical - Medication Refill >> Oct 24, 2023  8:00 AM Carlatta H wrote: Most Recent Primary Care Visit:  Provider: TILFORD BERTRAM HERO  Department: [facility]  Visit Type: OFFICE VISIT  Date: 09/24/2023  Medication: oxyCODONE -acetaminophen  (PERCOCET) 10-325 MG tablet [[id number]]  Has the patient contacted their pharmacy? Yes (Agent: If no, request that the patient contact the pharmacy for the refill. If patient does not wish to contact the pharmacy document the reason why and proceed with request.) (Agent: If yes, when and what did the pharmacy advise?)  Is this the correct pharmacy for this prescription? Yes If no, delete pharmacy and type the correct one.  This is the patient's preferred pharmacy:  DARRYLE LONG - [facility] [address] [place] [address] Phone: [phone] Fax: [phone]   Has the prescription been filled recently? No  Is the patient out of the medication? No  Has the patient been seen for an appointment in the last year OR does the patient have an upcoming appointment? Yes  Can we respond through MyChart? Yes  Agent: Please be advised that Rx refills may take up to 3 business days. We ask that you follow-up with your pharmacy.

## 2023-10-25 ENCOUNTER — Other Ambulatory Visit: Payer: Self-pay | Admitting: Family Medicine

## 2023-10-25 ENCOUNTER — Ambulatory Visit: Payer: Self-pay | Admitting: Family Medicine

## 2023-10-25 ENCOUNTER — Other Ambulatory Visit (HOSPITAL_COMMUNITY): Payer: Self-pay

## 2023-10-25 NOTE — Telephone Encounter (Signed)
 Copied from CRM [id number]. Topic: Clinical - Medication Refill >> Oct 24, 2023  8:00 AM Carlatta H wrote: Most Recent Primary Care Visit:  Provider: TILFORD BERTRAM HERO  Department: [facility]  Visit Type: OFFICE VISIT  Date: 09/24/2023  Medication: oxyCODONE -acetaminophen  (PERCOCET) 10-325 MG tablet [[id number]]  Has the patient contacted their pharmacy? Yes (Agent: If no, request that the patient contact the pharmacy for the refill. If patient does not wish to contact the pharmacy document the reason why and proceed with request.) (Agent: If yes, when and what did the pharmacy advise?)  Is this the correct pharmacy for this prescription? Yes If no, delete pharmacy and type the correct one.  This is the patient's preferred pharmacy:  DARRYLE LONG - [facility] [address] [place] [address] Phone: [phone] Fax: [phone]   Has the prescription been filled recently? No  Is the patient out of the medication? No  Has the patient been seen for an appointment in the last year OR does the patient have an upcoming appointment? Yes  Can we respond through MyChart? Yes  Agent: Please be advised that Rx refills may take up to 3 business days. We ask that you follow-up with your pharmacy. Reason for Disposition  Caller requesting a CONTROLLED substance prescription refill (e.g., narcotics, ADHD medicines)  Answer Assessment - Initial Assessment Questions 1. DRUG NAME: What medicine do you need to have refilled?     Oxycodone  10/325  2. REFILLS REMAINING: How many refills are remaining? (Note: The label on the medicine or pill bottle will show how many refills are remaining. If there are no refills remaining, then a renewal may be needed.)     None  3. EXPIRATION DATE: What is the expiration date? (Note: The label states when the prescription will expire, and thus can no longer be refilled.)     N/a  4. PRESCRIBING HCP: Who prescribed it?  Reason: If prescribed by specialist, call should be referred to that group.     Bertram Tilford, FNP  5. SYMPTOMS: Do you have any symptoms?     Pain  Protocols used: Medication Refill and Renewal Call-A-AH

## 2023-10-25 NOTE — Telephone Encounter (Signed)
 Copied from CRM [id number]. Topic: Clinical - Medication Refill >> Oct 24, 2023  8:00 AM Carlatta H wrote: Most Recent Primary Care Visit:  Provider: TILFORD BERTRAM HERO  Department: [facility]  Visit Type: OFFICE VISIT  Date: 09/24/2023  Medication: oxyCODONE -acetaminophen  (PERCOCET) 10-325 MG tablet [[id number]]  Has the patient contacted their pharmacy? Yes (Agent: If no, request that the patient contact the pharmacy for the refill. If patient does not wish to contact the pharmacy document the reason why and proceed with request.) (Agent: If yes, when and what did the pharmacy advise?)  Is this the correct pharmacy for this prescription? Yes If no, delete pharmacy and type the correct one.  This is the patient's preferred pharmacy:  DARRYLE LONG - [facility] [address] [place] [address] Phone: [phone] Fax: [phone]   Has the prescription been filled recently? No  Is the patient out of the medication? No  Has the patient been seen for an appointment in the last year OR does the patient have an upcoming appointment? Yes  Can we respond through MyChart? Yes  Agent: Please be advised that Rx refills may take up to 3 business days. We ask that you follow-up with your pharmacy.

## 2023-10-28 ENCOUNTER — Other Ambulatory Visit (HOSPITAL_COMMUNITY): Payer: Self-pay

## 2023-10-28 ENCOUNTER — Telehealth: Payer: Self-pay

## 2023-10-28 NOTE — Telephone Encounter (Signed)
 Copied from CRM [id number]. Topic: Clinical - Medication Refill >> Oct 28, 2023 11:29 AM Renea ORN wrote: Most Recent Primary Care Visit:  Provider: TILFORD BERTRAM HERO  Department: [facility]  Visit Type: OFFICE VISIT  Date: 09/24/2023  Medication: oxyCODONE -acetaminophen  (PERCOCET) 10-325 MG tablet  Has the patient contacted their pharmacy? Yes (Agent: If no, request that the patient contact the pharmacy for the refill. If patient does not wish to contact the pharmacy document the reason why and proceed with request.) (Agent: If yes, when and what did the pharmacy advise?)  Is this the correct pharmacy for this prescription? Yes If no, delete pharmacy and type the correct one.  This is the patient's preferred pharmacy:   DARRYLE LONG - [facility] [address] [place] [address] Phone: [phone] Fax: [phone]   Has the prescription been filled recently? Yes  Is the patient out of the medication? Yes  Has the patient been seen for an appointment in the last year OR does the patient have an upcoming appointment? Yes  Can we respond through MyChart? No  Agent: Please be advised that Rx refills may take up to 3 business days. We ask that you follow-up with your pharmacy.

## 2023-10-29 ENCOUNTER — Telehealth: Payer: Self-pay | Admitting: Family Medicine

## 2023-10-29 ENCOUNTER — Telehealth: Payer: Self-pay

## 2023-10-29 NOTE — Telephone Encounter (Signed)
 Copied from CRM [id number]. Topic: Clinical - Medication Refill >> Oct 29, 2023 10:55 AM Wyona SQUIBB wrote: Most Recent Primary Care Visit:  Provider: TILFORD BERTRAM HERO  Department: [facility]  Visit Type: OFFICE VISIT  Date: 09/24/2023  Medication: oxyCODONE -acetaminophen  (PERCOCET) 10-325 MG tablet  Has the patient contacted their pharmacy? Yes (Agent: If no, request that the patient contact the pharmacy for the refill. If patient does not wish to contact the pharmacy document the reason why and proceed with request.) (Agent: If yes, when and what did the pharmacy advise?)  Is this the correct pharmacy for this prescription? Yes If no, delete pharmacy and type the correct one.  This is the patient's preferred pharmacy:  [facility] [address] [place] [address] Phone: [phone] Fax: [phone]   Has the prescription been filled recently? No  Is the patient out of the medication? Yes  Has the patient been seen for an appointment in the last year OR does the patient have an upcoming appointment? Yes  Can we respond through MyChart? Yes  Agent: Please be advised that Rx refills may take up to 3 business days. We ask that you follow-up with your pharmacy.

## 2023-10-30 ENCOUNTER — Other Ambulatory Visit (HOSPITAL_COMMUNITY): Payer: Self-pay

## 2023-11-05 ENCOUNTER — Other Ambulatory Visit (HOSPITAL_COMMUNITY): Payer: Self-pay

## 2023-11-05 MED ORDER — AMOXICILLIN 500 MG PO CAPS
500.0000 mg | ORAL_CAPSULE | Freq: Three times a day (TID) | ORAL | 0 refills | Status: DC
  Filled 2023-11-05: qty 21, 7d supply, fill #0

## 2023-11-06 ENCOUNTER — Other Ambulatory Visit (HOSPITAL_COMMUNITY): Payer: Self-pay

## 2023-11-06 ENCOUNTER — Encounter: Payer: Self-pay | Admitting: Nurse Practitioner

## 2023-11-06 ENCOUNTER — Ambulatory Visit (INDEPENDENT_AMBULATORY_CARE_PROVIDER_SITE_OTHER): Payer: 59 | Admitting: Nurse Practitioner

## 2023-11-06 VITALS — BP 126/50 | HR 55 | Wt 167.0 lb

## 2023-11-06 DIAGNOSIS — F17200 Nicotine dependence, unspecified, uncomplicated: Secondary | ICD-10-CM

## 2023-11-06 DIAGNOSIS — D571 Sickle-cell disease without crisis: Secondary | ICD-10-CM

## 2023-11-06 DIAGNOSIS — G894 Chronic pain syndrome: Secondary | ICD-10-CM | POA: Diagnosis not present

## 2023-11-06 HISTORY — DX: Nicotine dependence, unspecified, uncomplicated: F17.200

## 2023-11-06 MED ORDER — OXYCODONE-ACETAMINOPHEN 10-325 MG PO TABS
1.0000 | ORAL_TABLET | Freq: Four times a day (QID) | ORAL | 0 refills | Status: DC | PRN
  Filled 2023-11-06: qty 60, 15d supply, fill #0

## 2023-11-06 NOTE — Assessment & Plan Note (Signed)
 Smokes about 2 pack of cigar /day  Asked about quitting: confirms that he/she currently smokes cigarettes Advise to quit smoking: Educated about QUITTING to reduce the risk of cancer, cardio and cerebrovascular disease. Assess willingness: Unwilling to quit at this time, but is working on cutting back. Assist with counseling and pharmacotherapy: Counseled for 5 minutes and literature provided. Arrange for follow up: follow up in 3 months and continue to offer help.

## 2023-11-06 NOTE — Patient Instructions (Addendum)
 1. Sickle cell disease without crisis (HCC) (Primary)  - Sickle Cell Panel - 161096 11+Oxyco+Alc+Crt-Bund - oxyCODONE -acetaminophen  (PERCOCET) 10-325 MG tablet; Take 1 tablet by mouth every 6 (six) hours as needed for up to 15 days for pain.  Dispense: 60 tablet; Refill: 0  2. Chronic pain syndrome  - oxyCODONE -acetaminophen  (PERCOCET) 10-325 MG tablet; Take 1 tablet by mouth every 6 (six) hours as needed for up to 15 days for pain.  Dispense: 60 tablet; Refill: 0       It is important that you exercise regularly at least 30 minutes 5 times a week as tolerated  Think about what you will eat, plan ahead. Choose " clean, green, fresh or frozen" over canned, processed or packaged foods which are more sugary, salty and fatty. 70 to 75% of food eaten should be vegetables and fruit. Three meals at set times with snacks allowed between meals, but they must be fruit or vegetables. Aim to eat over a 12 hour period , example 7 am to 7 pm, and STOP after  your last meal of the day. Drink water,generally about 64 ounces per day, no other drink is as healthy. Fruit juice is best enjoyed in a healthy way, by EATING the fruit.  Thanks for choosing [facility] we consider it a privelige to serve you.

## 2023-11-06 NOTE — Progress Notes (Signed)
 Established Patient Office Visit  Subjective:  Patient ID: Scott Norton, male    DOB: Mar 31, 1966  Age: 58 y.o. MRN: [id number]  CC:  Chief Complaint  Patient presents with   Sickle Cell Anemia    HPI Scott Norton is a 58 y.o. male  has a past medical history of AKI (acute kidney injury) (HCC) (02/08/2021), Anemia, Cocaine abuse (HCC) (02/08/2021), Depression, Heart murmur, Seasonal allergies, Sickle cell anemia (HCC), Suicidal ideation, and Tobacco use disorder (11/06/2023).  Patient presents for follow-up for his chronic medical conditions and to establish care with new provider Stated that he has been out of Percocet 07/24/2024 since about a week ago.  His sickle cell pain is usually in his knees elbows and back currently has pain rated 10/10.  He does have well-controlled sickle cell disease with infrequent pain crisis.  Not seeing hematologist currently.  Has history of cocaine abuse but patient denies current use of cocaine.  He denies fever chills chest pain fatigue dizziness nausea vomiting diarrhea.  Tobacco use disorder.  Smokes 2 pack of cigarettes daily stated that he has cut down from 5 packs daily.  He denies cough, shortness of breath, wheezing  Taking amoxicillin  for a tooth infection  Need for pneumonia vaccine, shingles vaccine flu vaccine discussed patient declined the vaccines.  Stated that he was supposed to have colonoscopy done but the test was canceled.  He plans on rescheduling the test.      Past Medical History:  Diagnosis Date   AKI (acute kidney injury) (HCC) 02/08/2021   Anemia    Cocaine abuse (HCC) 02/08/2021   Depression    Heart murmur    Seasonal allergies    wool & grass   Sickle cell anemia (HCC)    Suicidal ideation    Tobacco use disorder 11/06/2023    Past Surgical History:  Procedure Laterality Date   NO PAST SURGERIES     ROOT CANAL      Family History  Problem Relation Age of Onset   Alcohol  abuse Mother    Sickle cell  trait Mother    Hypertension Mother    Alcohol  abuse Father    Sickle cell trait Father    Early death Father        due to alcoholism   Hypertension Father    Hypertension Sister    Hypertension Brother    Hypertension Maternal Grandmother    Hypertension Maternal Grandfather    Hypertension Paternal Grandmother    Hypertension Paternal Grandfather     Social History   Socioeconomic History   Marital status: Single    Spouse name: Not on file   Number of children: 3   Years of education: Not on file   Highest education level: Not on file  Occupational History   Not on file  Tobacco Use   Smoking status: Some Days    Current packs/day: 0.50    Types: Cigars, Cigarettes   Smokeless tobacco: Never   Tobacco comments:    10 cigars per week.  Vaping Use   Vaping status: Never Used  Substance and Sexual Activity   Alcohol  use: Not Currently   Drug use: Not Currently    Types: Marijuana, Cocaine   Sexual activity: Yes  Other Topics Concern   Not on file  Social History Narrative   Lives with Friends in [place]. Working on housing for himself   On disability for his SS disease      No children,  no stable relationship   Social Drivers of Corporate investment banker Strain: Not on file  Food Insecurity: No Food Insecurity (08/27/2023)   Hunger Vital Sign    Worried About Running Out of Food in the Last Year: Never true    Ran Out of Food in the Last Year: Never true  Transportation Needs: No Transportation Needs (08/27/2023)   PRAPARE - Administrator, Civil Service (Medical): No    Lack of Transportation (Non-Medical): No  Physical Activity: Not on file  Stress: Not on file  Social Connections: Not on file  Intimate Partner Violence: Not At Risk (08/27/2023)   Humiliation, Afraid, Rape, and Kick questionnaire    Fear of Current or Ex-Partner: No    Emotionally Abused: No    Physically Abused: No    Sexually Abused: No    Outpatient Medications Prior to  Visit  Medication Sig Dispense Refill   amoxicillin  (AMOXIL ) 500 MG capsule Take 1 capsule (500 mg total) by mouth every 8 (eight) hours until finished. 21 capsule 0   ibuprofen  (ADVIL ) 800 MG tablet Take 1 tablet by mouth every 8 hours as needed. 60 tablet 1   No facility-administered medications prior to visit.    Allergies  Allergen Reactions   Aspirin Other (See Comments)    Increased heart rate   Tape Rash   Tramadol  Hives and Itching    ROS Review of Systems  Constitutional:  Negative for appetite change, chills, fatigue and fever.  HENT:  Negative for congestion, postnasal drip, rhinorrhea and sneezing.   Respiratory:  Negative for cough, shortness of breath and wheezing.   Cardiovascular:  Negative for chest pain, palpitations and leg swelling.  Gastrointestinal:  Negative for abdominal pain, constipation, nausea and vomiting.  Genitourinary:  Negative for difficulty urinating, dysuria, flank pain and frequency.  Musculoskeletal:  Positive for arthralgias. Negative for back pain, joint swelling and myalgias.  Skin:  Negative for color change, pallor, rash and wound.  Neurological:  Negative for dizziness, facial asymmetry, weakness, numbness and headaches.  Psychiatric/Behavioral:  Negative for behavioral problems, confusion, self-injury and suicidal ideas.       Objective:    Physical Exam Vitals and nursing note reviewed.  Constitutional:      General: He is not in acute distress.    Appearance: Normal appearance. He is not ill-appearing, toxic-appearing or diaphoretic.  HENT:     Mouth/Throat:     Mouth: Mucous membranes are moist.     Pharynx: Oropharynx is clear. No oropharyngeal exudate or posterior oropharyngeal erythema.  Eyes:     General: Scleral icterus present.        Right eye: No discharge.        Left eye: No discharge.     Extraocular Movements: Extraocular movements intact.     Conjunctiva/sclera: Conjunctivae normal.  Cardiovascular:     Rate  and Rhythm: Normal rate and regular rhythm.     Pulses: Normal pulses.     Heart sounds: Normal heart sounds. No murmur heard.    No friction rub. No gallop.  Pulmonary:     Effort: Pulmonary effort is normal. No respiratory distress.     Breath sounds: Normal breath sounds. No stridor. No wheezing, rhonchi or rales.  Chest:     Chest wall: No tenderness.  Abdominal:     General: There is no distension.     Palpations: Abdomen is soft.     Tenderness: There is no abdominal tenderness. There  is no right CVA tenderness, left CVA tenderness or guarding.  Musculoskeletal:        General: No swelling, deformity or signs of injury.     Right lower leg: No edema.     Left lower leg: No edema.  Skin:    General: Skin is warm and dry.     Capillary Refill: Capillary refill takes less than 2 seconds.     Coloration: Skin is not jaundiced or pale.     Findings: No bruising, erythema or lesion.  Neurological:     Mental Status: He is alert and oriented to person, place, and time.     Motor: No weakness.     Coordination: Coordination normal.     Gait: Gait normal.  Psychiatric:        Mood and Affect: Mood normal.        Behavior: Behavior normal.        Thought Content: Thought content normal.        Judgment: Judgment normal.     BP (!) 126/50   Pulse (!) 55   Wt 167 lb (75.8 kg)   SpO2 100%   BMI 20.87 kg/m  Wt Readings from Last 3 Encounters:  11/06/23 167 lb (75.8 kg)  09/24/23 164 lb (74.4 kg)  08/25/23 154 lb 5.2 oz (70 kg)    Lab Results  Component Value Date   TSH 1.730 03/01/2021   Lab Results  Component Value Date   WBC 5.7 09/24/2023   HGB 8.7 (L) 09/24/2023   HCT 26.3 (L) 09/24/2023   MCV 94 09/24/2023   PLT 164 09/24/2023   Lab Results  Component Value Date   NA 139 08/28/2023   K 5.3 (H) 08/28/2023   CO2 17 (L) 08/28/2023   GLUCOSE 94 08/28/2023   BUN 45 (H) 08/28/2023   CREATININE 2.14 (H) 08/28/2023   BILITOT 2.6 (H) 08/27/2023   ALKPHOS 172  (H) 08/27/2023   AST 75 (H) 08/27/2023   ALT 28 08/27/2023   PROT 6.1 (L) 08/27/2023   ALBUMIN 3.2 (L) 08/27/2023   CALCIUM  8.8 (L) 08/28/2023   ANIONGAP 8 08/28/2023   EGFR 47 (L) 06/25/2023   Lab Results  Component Value Date   CHOL 127 03/01/2021   Lab Results  Component Value Date   HDL 43 03/01/2021   Lab Results  Component Value Date   LDLCALC 66 03/01/2021   Lab Results  Component Value Date   TRIG 89 03/01/2021   Lab Results  Component Value Date   CHOLHDL 3.0 03/01/2021   Lab Results  Component Value Date   HGBA1C <4.2 (L) 03/01/2021      Assessment & Plan:   Problem List Items Addressed This Visit       Other   Sickle cell anemia (HCC) - Primary    Pulmonary evaluation - Patient denies severe recurrent wheezes, shortness of breath with exercise, or persistent cough.   Cardiac-patient denies chest pain, dizziness, edema  Eye - High risk of proliferative retinopathy.  Will follow-up on this at next visit    Immunization status -patient declined all vaccines.  He was encouraged to consider getting the vaccines  Acute and chronic painful episodes -  Pt is also aware that the prescription history is available to us  online through the [facility] CSRS. We reminded the patient that all patients receiving Schedule II narcotics must be seen for follow within the 3 months in the office.  We reviewed the terms of our  pain agreement, including the need to keep medicines in a safe locked location away from children or pets, and the need to report excess sedation or constipation, measures to avoid constipation, and policies related to early refills and stolen prescriptions. According to the [place] Chronic Pain Initiative program, we have reviewed details related to analgesia, adverse effects, aberrant behaviors. Reviewed [place] Substance Reporting system prior to prescribing opiate medication, no inconsistencies noted.   Since his urine drug screen has been negative in the past few  months patient will follow-up in the office in 1 months for follow-up and repeat drug screen    - Sickle Cell Panel - [id number] 11+Oxyco+Alc+Crt-Bund - oxyCODONE -acetaminophen  (PERCOCET) 10-325 MG tablet; Take 1 tablet by mouth every 6 (six) hours as needed for up to 15 days for pain.  Dispense: 60 tablet; Refill: 0           Relevant Medications   oxyCODONE -acetaminophen  (PERCOCET) 10-325 MG tablet   Other Relevant Orders   Sickle Cell Panel   [id number] 11+Oxyco+Alc+Crt-Bund   Chronic pain   Relevant Medications   oxyCODONE -acetaminophen  (PERCOCET) 10-325 MG tablet   Tobacco use disorder   Smokes about 2 pack of cigar /day  Asked about quitting: confirms that he/she currently smokes cigarettes Advise to quit smoking: Educated about QUITTING to reduce the risk of cancer, cardio and cerebrovascular disease. Assess willingness: Unwilling to quit at this time, but is working on cutting back. Assist with counseling and pharmacotherapy: Counseled for 5 minutes and literature provided. Arrange for follow up: follow up in 3 months and continue to offer help.        Meds ordered this encounter  Medications   oxyCODONE -acetaminophen  (PERCOCET) 10-325 MG tablet    Sig: Take 1 tablet by mouth every 6 (six) hours as needed for up to 15 days for pain.    Dispense:  60 tablet    Refill:  0    Follow-up: Return in about 4 weeks (around 12/04/2023) for sickle cell disease.    Tala Eber R Kitai Purdom, FNP

## 2023-11-06 NOTE — Assessment & Plan Note (Signed)
  Pulmonary evaluation - Patient denies severe recurrent wheezes, shortness of breath with exercise, or persistent cough.   Cardiac-patient denies chest pain, dizziness, edema  Eye - High risk of proliferative retinopathy.  Will follow-up on this at next visit    Immunization status -patient declined all vaccines.  He was encouraged to consider getting the vaccines  Acute and chronic painful episodes -  Pt is also aware that the prescription history is available to us  online through the [place] CSRS. We reminded the patient that all patients receiving Schedule II narcotics must be seen for follow within the 3 months in the office.  We reviewed the terms of our pain agreement, including the need to keep medicines in a safe locked location away from children or pets, and the need to report excess sedation or constipation, measures to avoid constipation, and policies related to early refills and stolen prescriptions. According to the [place] Chronic Pain Initiative program, we have reviewed details related to analgesia, adverse effects, aberrant behaviors. Reviewed [place] Substance Reporting system prior to prescribing opiate medication, no inconsistencies noted.   Since his urine drug screen has been negative in the past few months patient will follow-up in the office in 1 months for follow-up and repeat drug screen    - Sickle Cell Panel - [id number] 11+Oxyco+Alc+Crt-Bund - oxyCODONE -acetaminophen  (PERCOCET) 10-325 MG tablet; Take 1 tablet by mouth every 6 (six) hours as needed for up to 15 days for pain.  Dispense: 60 tablet; Refill: 0

## 2023-11-07 LAB — DRUG SCREEN 764883 11+OXYCO+ALC+CRT-BUND
Amphetamines, Urine: NEGATIVE ng/mL
BENZODIAZ UR QL: NEGATIVE ng/mL
Barbiturate: NEGATIVE ng/mL
Cannabinoid Quant, Ur: NEGATIVE ng/mL
Cocaine (Metabolite): NEGATIVE ng/mL
Creatinine: 75.4 mg/dL (ref 20.0–300.0)
Ethanol: NEGATIVE %
Meperidine: NEGATIVE ng/mL
Methadone Screen, Urine: NEGATIVE ng/mL
OPIATE SCREEN URINE: NEGATIVE ng/mL
Oxycodone/Oxymorphone, Urine: NEGATIVE ng/mL
Phencyclidine: NEGATIVE ng/mL
Propoxyphene: NEGATIVE ng/mL
Tramadol: NEGATIVE ng/mL
pH, Urine: 5.3 (ref 4.5–8.9)

## 2023-11-08 ENCOUNTER — Other Ambulatory Visit (HOSPITAL_COMMUNITY): Payer: Self-pay

## 2023-11-08 ENCOUNTER — Other Ambulatory Visit: Payer: Self-pay | Admitting: Nurse Practitioner

## 2023-11-08 DIAGNOSIS — E875 Hyperkalemia: Secondary | ICD-10-CM

## 2023-11-08 MED ORDER — LOKELMA 10 G PO PACK
10.0000 g | PACK | Freq: Once | ORAL | 0 refills | Status: AC
  Filled 2023-11-08: qty 1, 1d supply, fill #0

## 2023-11-13 ENCOUNTER — Other Ambulatory Visit: Payer: 59

## 2023-11-13 DIAGNOSIS — E875 Hyperkalemia: Secondary | ICD-10-CM

## 2023-11-13 LAB — CMP14+CBC/D/PLT+FER+RETIC+V...
ALT: 14 IU/L (ref 0–44)
AST: 52 IU/L — ABNORMAL HIGH (ref 0–40)
Albumin: 4.8 g/dL (ref 3.8–4.9)
Alkaline Phosphatase: 292 IU/L — ABNORMAL HIGH (ref 44–121)
BUN/Creatinine Ratio: 8 — ABNORMAL LOW (ref 9–20)
BUN: 13 mg/dL (ref 6–24)
Basophils Absolute: 0 10*3/uL (ref 0.0–0.2)
Basos: 1 %
Bilirubin Total: 1.5 mg/dL — ABNORMAL HIGH (ref 0.0–1.2)
Calcium: 10 mg/dL (ref 8.7–10.2)
Chloride: 117 mmol/L (ref 96–106)
Creatinine, Ser: 1.57 mg/dL — ABNORMAL HIGH (ref 0.76–1.27)
EOS (ABSOLUTE): 0.3 10*3/uL (ref 0.0–0.4)
Eos: 3 %
Ferritin: 417 ng/mL — ABNORMAL HIGH (ref 30–400)
Globulin, Total: 2.5 g/dL (ref 1.5–4.5)
Glucose: 88 mg/dL (ref 70–99)
Hematocrit: 24 % — ABNORMAL LOW (ref 37.5–51.0)
Hemoglobin: 7.8 g/dL — ABNORMAL LOW (ref 13.0–17.7)
Immature Grans (Abs): 0 10*3/uL (ref 0.0–0.1)
Immature Granulocytes: 0 %
Lymphocytes Absolute: 2.7 10*3/uL (ref 0.7–3.1)
Lymphs: 35 %
MCH: 30.6 pg (ref 26.6–33.0)
MCHC: 32.5 g/dL (ref 31.5–35.7)
MCV: 94 fL (ref 79–97)
Monocytes Absolute: 1.2 10*3/uL — ABNORMAL HIGH (ref 0.1–0.9)
Monocytes: 15 %
NRBC: 9 % — ABNORMAL HIGH (ref 0–0)
Neutrophils Absolute: 3.7 10*3/uL (ref 1.4–7.0)
Neutrophils: 46 %
Platelets: 175 10*3/uL (ref 150–450)
Potassium: 5.8 mmol/L — ABNORMAL HIGH (ref 3.5–5.2)
RBC: 2.55 x10E6/uL — CL (ref 4.14–5.80)
RDW: 23.2 % — ABNORMAL HIGH (ref 11.6–15.4)
Retic Ct Pct: 10.5 % — ABNORMAL HIGH (ref 0.6–2.6)
Sodium: 149 mmol/L — ABNORMAL HIGH (ref 134–144)
Total Protein: 7.3 g/dL (ref 6.0–8.5)
WBC: 7.9 10*3/uL (ref 3.4–10.8)
eGFR: 51 mL/min/{1.73_m2} — ABNORMAL LOW (ref >59)

## 2023-11-14 LAB — BASIC METABOLIC PANEL
BUN/Creatinine Ratio: 8 — ABNORMAL LOW (ref 9–20)
BUN: 12 mg/dL (ref 6–24)
CO2: 22 mmol/L (ref 20–29)
Calcium: 9 mg/dL (ref 8.7–10.2)
Chloride: 108 mmol/L — ABNORMAL HIGH (ref 96–106)
Creatinine, Ser: 1.53 mg/dL — ABNORMAL HIGH (ref 0.76–1.27)
Glucose: 99 mg/dL (ref 70–99)
Potassium: 4.3 mmol/L (ref 3.5–5.2)
Sodium: 141 mmol/L (ref 134–144)
eGFR: 53 mL/min/{1.73_m2} — ABNORMAL LOW (ref >59)

## 2023-11-17 ENCOUNTER — Other Ambulatory Visit: Payer: Self-pay | Admitting: Nurse Practitioner

## 2023-11-17 DIAGNOSIS — G894 Chronic pain syndrome: Secondary | ICD-10-CM

## 2023-11-17 DIAGNOSIS — D571 Sickle-cell disease without crisis: Secondary | ICD-10-CM

## 2023-11-18 ENCOUNTER — Other Ambulatory Visit: Payer: Self-pay | Admitting: Nurse Practitioner

## 2023-11-18 DIAGNOSIS — D571 Sickle-cell disease without crisis: Secondary | ICD-10-CM

## 2023-11-18 DIAGNOSIS — G894 Chronic pain syndrome: Secondary | ICD-10-CM

## 2023-11-18 MED ORDER — OXYCODONE-ACETAMINOPHEN 10-325 MG PO TABS
1.0000 | ORAL_TABLET | Freq: Four times a day (QID) | ORAL | 0 refills | Status: DC | PRN
  Filled 2023-11-21: qty 60, 15d supply, fill #0

## 2023-11-18 NOTE — Telephone Encounter (Signed)
Please advise [facility]

## 2023-11-18 NOTE — Progress Notes (Signed)
Reviewed PDMP substance reporting system prior to prescribing opiate medications. No inconsistencies noted.      1. Sickle cell disease without crisis (HCC)  - oxyCODONE-acetaminophen (PERCOCET) 10-325 MG tablet; Take 1 tablet by mouth every 6 (six) hours as needed for up to 15 days for pain.  Dispense: 60 tablet; Refill: 0  2. Chronic pain syndrome  - oxyCODONE-acetaminophen (PERCOCET) 10-325 MG tablet; Take 1 tablet by mouth every 6 (six) hours as needed for up to 15 days for pain.  Dispense: 60 tablet; Refill: 0

## 2023-11-19 ENCOUNTER — Other Ambulatory Visit (HOSPITAL_COMMUNITY): Payer: Self-pay

## 2023-11-21 ENCOUNTER — Other Ambulatory Visit (HOSPITAL_COMMUNITY): Payer: Self-pay

## 2023-11-21 ENCOUNTER — Other Ambulatory Visit: Payer: Self-pay

## 2023-12-02 ENCOUNTER — Other Ambulatory Visit: Payer: Self-pay | Admitting: Nurse Practitioner

## 2023-12-02 DIAGNOSIS — G894 Chronic pain syndrome: Secondary | ICD-10-CM

## 2023-12-02 DIAGNOSIS — D571 Sickle-cell disease without crisis: Secondary | ICD-10-CM

## 2023-12-03 ENCOUNTER — Other Ambulatory Visit (HOSPITAL_COMMUNITY): Payer: Self-pay

## 2023-12-03 ENCOUNTER — Other Ambulatory Visit: Payer: Self-pay | Admitting: Nurse Practitioner

## 2023-12-03 DIAGNOSIS — G894 Chronic pain syndrome: Secondary | ICD-10-CM

## 2023-12-03 DIAGNOSIS — D571 Sickle-cell disease without crisis: Secondary | ICD-10-CM

## 2023-12-03 MED ORDER — OXYCODONE-ACETAMINOPHEN 10-325 MG PO TABS
1.0000 | ORAL_TABLET | Freq: Four times a day (QID) | ORAL | 0 refills | Status: DC | PRN
  Filled 2023-12-06: qty 60, 15d supply, fill #0

## 2023-12-03 NOTE — Progress Notes (Signed)
Reviewed PDMP substance reporting system prior to prescribing opiate medications. No inconsistencies noted.      1. Sickle cell disease without crisis (HCC)  - oxyCODONE-acetaminophen (PERCOCET) 10-325 MG tablet; Take 1 tablet by mouth every 6 (six) hours as needed for up to 15 days for pain.  Dispense: 60 tablet; Refill: 0  2. Chronic pain syndrome  - oxyCODONE-acetaminophen (PERCOCET) 10-325 MG tablet; Take 1 tablet by mouth every 6 (six) hours as needed for up to 15 days for pain.  Dispense: 60 tablet; Refill: 0

## 2023-12-06 ENCOUNTER — Ambulatory Visit: Payer: Self-pay | Admitting: Nurse Practitioner

## 2023-12-06 ENCOUNTER — Other Ambulatory Visit (HOSPITAL_COMMUNITY): Payer: Self-pay

## 2023-12-15 ENCOUNTER — Other Ambulatory Visit: Payer: Self-pay | Admitting: Nurse Practitioner

## 2023-12-15 DIAGNOSIS — G894 Chronic pain syndrome: Secondary | ICD-10-CM

## 2023-12-15 DIAGNOSIS — D571 Sickle-cell disease without crisis: Secondary | ICD-10-CM

## 2023-12-16 ENCOUNTER — Other Ambulatory Visit (HOSPITAL_COMMUNITY): Payer: Self-pay

## 2023-12-16 ENCOUNTER — Other Ambulatory Visit: Payer: Self-pay | Admitting: Nurse Practitioner

## 2023-12-16 DIAGNOSIS — D571 Sickle-cell disease without crisis: Secondary | ICD-10-CM

## 2023-12-16 DIAGNOSIS — G894 Chronic pain syndrome: Secondary | ICD-10-CM

## 2023-12-16 MED ORDER — OXYCODONE-ACETAMINOPHEN 10-325 MG PO TABS
1.0000 | ORAL_TABLET | Freq: Four times a day (QID) | ORAL | 0 refills | Status: DC | PRN
  Filled 2023-12-21: qty 60, 15d supply, fill #0

## 2023-12-16 NOTE — Telephone Encounter (Signed)
 Please advise [facility]

## 2023-12-16 NOTE — Progress Notes (Signed)
 Reviewed PDMP substance reporting system prior to prescribing opiate medications. No inconsistencies noted.      1. Sickle cell disease without crisis (HCC)  - oxyCODONE-acetaminophen (PERCOCET) 10-325 MG tablet; Take 1 tablet by mouth every 6 (six) hours as needed for up to 15 days for pain.  Dispense: 60 tablet; Refill: 0  2. Chronic pain syndrome  - oxyCODONE-acetaminophen (PERCOCET) 10-325 MG tablet; Take 1 tablet by mouth every 6 (six) hours as needed for up to 15 days for pain.  Dispense: 60 tablet; Refill: 0

## 2023-12-18 ENCOUNTER — Ambulatory Visit (INDEPENDENT_AMBULATORY_CARE_PROVIDER_SITE_OTHER): Payer: 59 | Admitting: Nurse Practitioner

## 2023-12-18 ENCOUNTER — Encounter: Payer: Self-pay | Admitting: Nurse Practitioner

## 2023-12-18 VITALS — BP 126/57 | HR 64 | Temp 97.1°F | Wt 161.0 lb

## 2023-12-18 DIAGNOSIS — G894 Chronic pain syndrome: Secondary | ICD-10-CM | POA: Diagnosis not present

## 2023-12-18 DIAGNOSIS — D571 Sickle-cell disease without crisis: Secondary | ICD-10-CM | POA: Diagnosis not present

## 2023-12-18 DIAGNOSIS — F17200 Nicotine dependence, unspecified, uncomplicated: Secondary | ICD-10-CM | POA: Diagnosis not present

## 2023-12-18 NOTE — Progress Notes (Addendum)
 Established Patient Office Visit  Subjective:  Patient ID: Scott Norton, male    DOB: [birth year]  Age: 58 y.o. MRN: [id number]  CC:  Chief Complaint  Patient presents with   Sickle Cell Anemia    HPI DOY [name] is a 58 y.o. male  has a past medical history of AKI (acute kidney injury) (HCC) (02/08/2021), Anemia, Cocaine abuse (HCC) (02/08/2021), Depression, Heart murmur, Seasonal allergies, Sickle cell anemia (HCC), Suicidal ideation, and Tobacco use disorder (11/06/2023).    Sickle cell disease.  For chronic pain patient takes ibuprofen 800 mg 3 times daily as needed, Percocet 07/24/2024 1 tablet every 6 hours as needed for moderate pain ,his sickle cell pain is currently 6/10 , pain is mainly in his shoulders and elbow , stated that Percocet was last taking last night . patient presents for follow-up for his chronic medical conditions. Patient states that he is doing well generally currently denies fever, chills, shortness of breath, chest pain, abdominal pain, nausea, vomiting.         Past Medical History:  Diagnosis Date   AKI (acute kidney injury) (HCC) 02/08/2021   Anemia    Cocaine abuse (HCC) 02/08/2021   Depression    Heart murmur    Seasonal allergies    wool & grass   Sickle cell anemia (HCC)    Suicidal ideation    Tobacco use disorder 11/06/2023    Past Surgical History:  Procedure Laterality Date   NO PAST SURGERIES     ROOT CANAL      Family History  Problem Relation Age of Onset   Alcohol abuse Mother    Sickle cell trait Mother    Hypertension Mother    Alcohol abuse Father    Sickle cell trait Father    Early death Father        due to alcoholism   Hypertension Father    Hypertension Sister    Hypertension Brother    Hypertension Maternal Grandmother    Hypertension Maternal Grandfather    Hypertension Paternal Grandmother    Hypertension Paternal Grandfather     Social History   Socioeconomic History   Marital status: Single     Spouse name: Not on file   Number of children: 3   Years of education: Not on file   Highest education level: Not on file  Occupational History   Not on file  Tobacco Use   Smoking status: Some Days    Current packs/day: 0.50    Types: Cigars, Cigarettes   Smokeless tobacco: Never   Tobacco comments:    10 cigars per week.  Vaping Use   Vaping status: Never Used  Substance and Sexual Activity   Alcohol use: Not Currently   Drug use: Not Currently    Types: Marijuana, Cocaine   Sexual activity: Yes  Other Topics Concern   Not on file  Social History Narrative   Lives with Friends in [place]. Working on housing for himself   On disability for his SS disease      No children, no stable relationship   Social Drivers of Corporate investment banker Strain: Not on file  Food Insecurity: No Food Insecurity (08/27/2023)   Hunger Vital Sign    Worried About Running Out of Food in the Last Year: Never true    Ran Out of Food in the Last Year: Never true  Transportation Needs: No Transportation Needs (08/27/2023)   PRAPARE - Transportation  Lack of Transportation (Medical): No    Lack of Transportation (Non-Medical): No  Physical Activity: Not on file  Stress: Not on file  Social Connections: Not on file  Intimate Partner Violence: Not At Risk (08/27/2023)   Humiliation, Afraid, Rape, and Kick questionnaire    Fear of Current or Ex-Partner: No    Emotionally Abused: No    Physically Abused: No    Sexually Abused: No    Outpatient Medications Prior to Visit  Medication Sig Dispense Refill   ibuprofen (ADVIL) 800 MG tablet Take 1 tablet by mouth every 8 hours as needed. 60 tablet 1   [START ON 12/21/2023] oxyCODONE-acetaminophen (PERCOCET) 10-325 MG tablet Take 1 tablet by mouth every 6 (six) hours as needed for up to 15 days for pain. 60 tablet 0   amoxicillin (AMOXIL) 500 MG capsule Take 1 capsule (500 mg total) by mouth every 8 (eight) hours until finished. 21 capsule 0   No  facility-administered medications prior to visit.    Allergies  Allergen Reactions   Aspirin Other (See Comments)    Increased heart rate   Tape Rash   Tramadol Hives and Itching    ROS Review of Systems  Constitutional:  Negative for appetite change, chills, fatigue and fever.  HENT:  Negative for congestion, postnasal drip, rhinorrhea and sneezing.   Respiratory:  Negative for cough, shortness of breath and wheezing.   Cardiovascular:  Negative for chest pain, palpitations and leg swelling.  Gastrointestinal:  Negative for abdominal pain, constipation, nausea and vomiting.  Genitourinary:  Negative for difficulty urinating, dysuria, flank pain and frequency.  Musculoskeletal:  Negative for arthralgias, back pain, joint swelling and myalgias.  Skin:  Negative for color change, pallor, rash and wound.  Neurological:  Negative for dizziness, facial asymmetry, weakness, numbness and headaches.  Psychiatric/Behavioral:  Negative for behavioral problems, confusion, self-injury and suicidal ideas.       Objective:    Physical Exam Vitals and nursing note reviewed.  Constitutional:      General: He is not in acute distress.    Appearance: Normal appearance. He is not ill-appearing, toxic-appearing or diaphoretic.  HENT:     Mouth/Throat:     Mouth: Mucous membranes are moist.     Pharynx: Oropharynx is clear. No oropharyngeal exudate or posterior oropharyngeal erythema.  Eyes:     General: Scleral icterus present.        Right eye: No discharge.        Left eye: No discharge.     Extraocular Movements: Extraocular movements intact.     Conjunctiva/sclera: Conjunctivae normal.  Cardiovascular:     Rate and Rhythm: Normal rate and regular rhythm.     Pulses: Normal pulses.     Heart sounds: Murmur heard.     No friction rub. No gallop.  Pulmonary:     Effort: Pulmonary effort is normal. No respiratory distress.     Breath sounds: Normal breath sounds. No stridor. No  wheezing, rhonchi or rales.  Chest:     Chest wall: No tenderness.  Abdominal:     General: There is no distension.     Palpations: Abdomen is soft.     Tenderness: There is no abdominal tenderness. There is no right CVA tenderness, left CVA tenderness or guarding.  Musculoskeletal:        General: No swelling, deformity or signs of injury.     Right lower leg: No edema.     Left lower leg: No edema.  Skin:    General: Skin is warm and dry.     Capillary Refill: Capillary refill takes less than 2 seconds.     Coloration: Skin is not jaundiced or pale.     Findings: No bruising, erythema or lesion.  Neurological:     Mental Status: He is alert and oriented to person, place, and time.     Motor: No weakness.     Coordination: Coordination normal.     Gait: Gait normal.  Psychiatric:        Mood and Affect: Mood normal.        Behavior: Behavior normal.        Thought Content: Thought content normal.        Judgment: Judgment normal.     BP (!) 126/57   Pulse 64   Temp (!) 97.1 F (36.2 C)   Wt 161 lb (73 kg)   SpO2 100%   BMI 20.12 kg/m  Wt Readings from Last 3 Encounters:  12/18/23 161 lb (73 kg)  11/06/23 167 lb (75.8 kg)  09/24/23 164 lb (74.4 kg)    Lab Results  Component Value Date   TSH 1.730 03/01/2021   Lab Results  Component Value Date   WBC 7.9 11/06/2023   HGB 7.8 (L) 11/06/2023   HCT 24.0 (L) 11/06/2023   MCV 94 11/06/2023   PLT 175 11/06/2023   Lab Results  Component Value Date   NA 141 11/13/2023   K 4.3 11/13/2023   CO2 22 11/13/2023   GLUCOSE 99 11/13/2023   BUN 12 11/13/2023   CREATININE 1.53 (H) 11/13/2023   BILITOT 1.5 (H) 11/06/2023   ALKPHOS 292 (H) 11/06/2023   AST 52 (H) 11/06/2023   ALT 14 11/06/2023   PROT 7.3 11/06/2023   ALBUMIN 4.8 11/06/2023   CALCIUM 9.0 11/13/2023   ANIONGAP 8 08/28/2023   EGFR 53 (L) 11/13/2023   Lab Results  Component Value Date   CHOL 127 03/01/2021   Lab Results  Component Value Date   HDL  43 03/01/2021   Lab Results  Component Value Date   LDLCALC 66 03/01/2021   Lab Results  Component Value Date   TRIG 89 03/01/2021   Lab Results  Component Value Date   CHOLHDL 3.0 03/01/2021   Lab Results  Component Value Date   HGBA1C <4.2 (L) 03/01/2021      Assessment & Plan:   Problem List Items Addressed This Visit       Other   Sickle cell anemia (HCC) - Primary   We discussed the need for good hydration, monitoring of hydration status, avoidance of heat, cold, stress, and infection triggers. Patient was reminded of the need to seek medical attention immediately if any symptom of bleeding, anemia, or infection occurs.    Patient declined flu, pneumonia and shingles vaccine, need for all the vaccines were discussed  Acute and chronic painful episodes -   utilizes Ibuprofen for mild pain related to sickle cell anemia.  Takes oxycodone-acetaminophen 10-325 mg 1 tablet every 6 hours as needed for moderate pain. we discussed that pt is to receive his Schedule II prescriptions only from [place]. Pt is also aware that the prescription history is available to [place] online through the [facility] CSRS. We reminded the patient that all patients receiving Schedule II narcotics must be seen for follow within the 3 months in the office.  We reviewed the terms of our pain agreement, including the need to keep medicines in a  safe locked location away from children or pets, and the need to report excess sedation or constipation, measures to avoid constipation, and policies related to early refills and stolen prescriptions. According to the [place] Chronic Pain Initiative program, we have reviewed details related to analgesia, adverse effects, aberrant behaviors.   New controlled substances prescription agreement form completed today   - ToxAssure Flex 15, Ur - Sickle Cell Panel         Relevant Orders   ToxAssure Flex 15, Ur   Sickle Cell Panel   Chronic pain   Continue ibuprofen 800 mg 3 times daily  as needed for mild pain, Percocet 07/24/2024 1 tablet every 6 hours as needed for moderate pain 1. Sickle cell disease without crisis (HCC) (Primary)  - ToxAssure Flex 15, Ur - Sickle Cell Panel       Tobacco use disorder   Smokes 2 pack of cigarettes daily Smoking cessation encouraged       No orders of the defined types were placed in this encounter.   Follow-up: Return in about 3 months (around 03/16/2024) for CPE.    Donell Beers, FNP

## 2023-12-18 NOTE — Assessment & Plan Note (Signed)
 Smokes 2 pack of cigarettes daily Smoking cessation encouraged

## 2023-12-18 NOTE — Patient Instructions (Addendum)
 Please come fasting to your next appointment so we can do a complete physical    It is important that you exercise regularly at least 30 minutes 5 times a week as tolerated  Think about what you will eat, plan ahead. Choose " clean, green, fresh or frozen" over canned, processed or packaged foods which are more sugary, salty and fatty. 70 to 75% of food eaten should be vegetables and fruit. Three meals at set times with snacks allowed between meals, but they must be fruit or vegetables. Aim to eat over a 12 hour period , example 7 am to 7 pm, and STOP after  your last meal of the day. Drink water,generally about 64 ounces per day, no other drink is as healthy. Fruit juice is best enjoyed in a healthy way, by EATING the fruit.  Thanks for choosing [facility] we consider it a privelige to serve you.

## 2023-12-18 NOTE — Assessment & Plan Note (Addendum)
 We discussed the need for good hydration, monitoring of hydration status, avoidance of heat, cold, stress, and infection triggers. Patient was reminded of the need to seek medical attention immediately if any symptom of bleeding, anemia, or infection occurs.    Patient declined flu, pneumonia and shingles vaccine, need for all the vaccines were discussed  Acute and chronic painful episodes -   utilizes Ibuprofen for mild pain related to sickle cell anemia.  Takes oxycodone-acetaminophen 10-325 mg 1 tablet every 6 hours as needed for moderate pain. we discussed that pt is to receive his Schedule II prescriptions only from [place]. Pt is also aware that the prescription history is available to [place] online through the [facility] CSRS. We reminded the patient that all patients receiving Schedule II narcotics must be seen for follow within the 3 months in the office.  We reviewed the terms of our pain agreement, including the need to keep medicines in a safe locked location away from children or pets, and the need to report excess sedation or constipation, measures to avoid constipation, and policies related to early refills and stolen prescriptions. According to the [place] Chronic Pain Initiative program, we have reviewed details related to analgesia, adverse effects, aberrant behaviors.   New controlled substances prescription agreement form completed today   - ToxAssure Flex 15, Ur - Sickle Cell Panel

## 2023-12-18 NOTE — Assessment & Plan Note (Signed)
 Continue ibuprofen 800 mg 3 times daily as needed for mild pain, Percocet 07/24/2024 1 tablet every 6 hours as needed for moderate pain 1. Sickle cell disease without crisis (HCC) (Primary)  - ToxAssure Flex 15, Ur - Sickle Cell Panel

## 2023-12-19 LAB — CMP14+CBC/D/PLT+FER+RETIC+V...
ALT: 22 [IU]/L (ref 0–44)
AST: 64 [IU]/L — ABNORMAL HIGH (ref 0–40)
Albumin: 3.9 g/dL (ref 3.8–4.9)
Alkaline Phosphatase: 309 [IU]/L — ABNORMAL HIGH (ref 44–121)
BUN/Creatinine Ratio: 9 (ref 9–20)
BUN: 13 mg/dL (ref 6–24)
Basophils Absolute: 0 10*3/uL (ref 0.0–0.2)
Basos: 0 %
Bilirubin Total: 2.1 mg/dL — ABNORMAL HIGH (ref 0.0–1.2)
CO2: 19 mmol/L — ABNORMAL LOW (ref 20–29)
Calcium: 9 mg/dL (ref 8.7–10.2)
Chloride: 107 mmol/L — ABNORMAL HIGH (ref 96–106)
Creatinine, Ser: 1.37 mg/dL — ABNORMAL HIGH (ref 0.76–1.27)
EOS (ABSOLUTE): 0.2 10*3/uL (ref 0.0–0.4)
Eos: 3 %
Ferritin: 306 ng/mL (ref 30–400)
Globulin, Total: 2.6 g/dL (ref 1.5–4.5)
Glucose: 72 mg/dL (ref 70–99)
Hematocrit: 22.2 % — ABNORMAL LOW (ref 37.5–51.0)
Hemoglobin: 7.3 g/dL — ABNORMAL LOW (ref 13.0–17.7)
Immature Grans (Abs): 0 10*3/uL (ref 0.0–0.1)
Immature Granulocytes: 0 %
Lymphocytes Absolute: 3.7 10*3/uL — ABNORMAL HIGH (ref 0.7–3.1)
Lymphs: 42 %
MCH: 31.5 pg (ref 26.6–33.0)
MCHC: 32.9 g/dL (ref 31.5–35.7)
MCV: 96 fL (ref 79–97)
Monocytes Absolute: 1.1 10*3/uL — ABNORMAL HIGH (ref 0.1–0.9)
Monocytes: 13 %
NRBC: 9 % — ABNORMAL HIGH (ref 0–0)
Neutrophils Absolute: 3.6 10*3/uL (ref 1.4–7.0)
Neutrophils: 42 %
Platelets: 177 10*3/uL (ref 150–450)
Potassium: 4.6 mmol/L (ref 3.5–5.2)
RBC: 2.32 x10E6/uL — CL (ref 4.14–5.80)
RDW: 23.7 % — ABNORMAL HIGH (ref 11.6–15.4)
Retic Ct Pct: 11.4 % — ABNORMAL HIGH (ref 0.6–2.6)
Sodium: 140 mmol/L (ref 134–144)
Total Protein: 6.5 g/dL (ref 6.0–8.5)
Vit D, 25-Hydroxy: 19 ng/mL — ABNORMAL LOW (ref 30.0–100.0)
WBC: 8.6 10*3/uL (ref 3.4–10.8)
eGFR: 60 mL/min/{1.73_m2} (ref >59)

## 2023-12-20 ENCOUNTER — Other Ambulatory Visit: Payer: Self-pay | Admitting: Nurse Practitioner

## 2023-12-20 ENCOUNTER — Other Ambulatory Visit (HOSPITAL_COMMUNITY): Payer: Self-pay

## 2023-12-20 DIAGNOSIS — E559 Vitamin D deficiency, unspecified: Secondary | ICD-10-CM

## 2023-12-20 MED ORDER — VITAMIN D (ERGOCALCIFEROL) 1.25 MG (50000 UNIT) PO CAPS
50000.0000 [IU] | ORAL_CAPSULE | ORAL | 1 refills | Status: DC
  Filled 2023-12-20: qty 12, 84d supply, fill #0
  Filled 2024-03-17: qty 12, 84d supply, fill #1

## 2023-12-21 ENCOUNTER — Other Ambulatory Visit (HOSPITAL_COMMUNITY): Payer: Self-pay

## 2023-12-22 LAB — TOXASSURE FLEX 15, UR
6-ACETYLMORPHINE IA: NEGATIVE ng/mL
7-aminoclonazepam: NOT DETECTED ng/mg{creat}
AMPHETAMINES IA: NEGATIVE ng/mL
Alpha-hydroxyalprazolam: NOT DETECTED ng/mg{creat}
Alpha-hydroxymidazolam: NOT DETECTED ng/mg{creat}
Alpha-hydroxytriazolam: NOT DETECTED ng/mg{creat}
Alprazolam: NOT DETECTED ng/mg{creat}
BARBITURATES IA: NEGATIVE ng/mL
BUPRENORPHINE: NEGATIVE
Benzodiazepines: NEGATIVE
Buprenorphine: NOT DETECTED ng/mg{creat}
CANNABINOIDS IA: NEGATIVE ng/mL
COCAINE METABOLITE IA: NEGATIVE ng/mL
Clonazepam: NOT DETECTED ng/mg{creat}
Creatinine: 82 mg/dL
Desalkylflurazepam: NOT DETECTED ng/mg{creat}
Desmethyldiazepam: NOT DETECTED ng/mg{creat}
Desmethylflunitrazepam: NOT DETECTED ng/mg{creat}
Diazepam: NOT DETECTED ng/mg{creat}
ETHYL ALCOHOL Enzymatic: NEGATIVE g/dL
FENTANYL: NEGATIVE
Fentanyl: NOT DETECTED ng/mg{creat}
Flunitrazepam: NOT DETECTED ng/mg{creat}
Lorazepam: NOT DETECTED ng/mg{creat}
METHADONE IA: NEGATIVE ng/mL
METHADONE MTB IA: NEGATIVE ng/mL
Midazolam: NOT DETECTED ng/mg{creat}
Norbuprenorphine: NOT DETECTED ng/mg{creat}
Norfentanyl: NOT DETECTED ng/mg{creat}
OPIATE CLASS IA: NEGATIVE ng/mL
Oxazepam: NOT DETECTED ng/mg{creat}
PHENCYCLIDINE IA: NEGATIVE ng/mL
TAPENTADOL, IA: NEGATIVE ng/mL
TRAMADOL IA: NEGATIVE ng/mL
Temazepam: NOT DETECTED ng/mg{creat}

## 2023-12-22 LAB — OXYCODONE CLASS, MS, UR RFX
Noroxycodone: 391 ng/mg{creat}
Noroxymorphone: 70 ng/mg{creat}
Oxycodone Class Confirmation: POSITIVE
Oxycodone: 94 ng/mg{creat}
Oxymorphone: 235 ng/mg{creat}

## 2023-12-23 ENCOUNTER — Ambulatory Visit: Payer: Self-pay | Admitting: Nurse Practitioner

## 2023-12-25 ENCOUNTER — Other Ambulatory Visit: Payer: Self-pay | Admitting: Internal Medicine

## 2023-12-25 DIAGNOSIS — G894 Chronic pain syndrome: Secondary | ICD-10-CM

## 2023-12-27 ENCOUNTER — Other Ambulatory Visit (HOSPITAL_COMMUNITY): Payer: Self-pay

## 2023-12-27 ENCOUNTER — Other Ambulatory Visit: Payer: Self-pay | Admitting: Nurse Practitioner

## 2023-12-27 DIAGNOSIS — G894 Chronic pain syndrome: Secondary | ICD-10-CM

## 2023-12-27 MED ORDER — IBUPROFEN 800 MG PO TABS
800.0000 mg | ORAL_TABLET | Freq: Three times a day (TID) | ORAL | 1 refills | Status: DC | PRN
  Filled 2023-12-27: qty 60, 20d supply, fill #0
  Filled 2024-01-14: qty 60, 20d supply, fill #1

## 2023-12-31 ENCOUNTER — Other Ambulatory Visit: Payer: Self-pay | Admitting: Nurse Practitioner

## 2023-12-31 ENCOUNTER — Other Ambulatory Visit (HOSPITAL_COMMUNITY): Payer: Self-pay

## 2023-12-31 DIAGNOSIS — D571 Sickle-cell disease without crisis: Secondary | ICD-10-CM

## 2023-12-31 DIAGNOSIS — G894 Chronic pain syndrome: Secondary | ICD-10-CM

## 2023-12-31 MED ORDER — OXYCODONE-ACETAMINOPHEN 10-325 MG PO TABS
1.0000 | ORAL_TABLET | Freq: Four times a day (QID) | ORAL | 0 refills | Status: DC | PRN
  Filled 2024-01-06 (×2): qty 60, 15d supply, fill #0

## 2023-12-31 NOTE — Telephone Encounter (Signed)
 Please advise [facility]

## 2023-12-31 NOTE — Progress Notes (Signed)
 Reviewed PDMP substance reporting system prior to prescribing opiate medications. No inconsistencies noted.      1. Sickle cell disease without crisis (HCC)  - oxyCODONE-acetaminophen (PERCOCET) 10-325 MG tablet; Take 1 tablet by mouth every 6 (six) hours as needed for up to 15 days for pain.  Dispense: 60 tablet; Refill: 0  2. Chronic pain syndrome  - oxyCODONE-acetaminophen (PERCOCET) 10-325 MG tablet; Take 1 tablet by mouth every 6 (six) hours as needed for up to 15 days for pain.  Dispense: 60 tablet; Refill: 0

## 2024-01-06 ENCOUNTER — Other Ambulatory Visit (HOSPITAL_COMMUNITY): Payer: Self-pay

## 2024-01-06 ENCOUNTER — Other Ambulatory Visit: Payer: Self-pay

## 2024-01-18 ENCOUNTER — Other Ambulatory Visit: Payer: Self-pay | Admitting: Nurse Practitioner

## 2024-01-18 DIAGNOSIS — D571 Sickle-cell disease without crisis: Secondary | ICD-10-CM

## 2024-01-18 DIAGNOSIS — G894 Chronic pain syndrome: Secondary | ICD-10-CM

## 2024-01-20 ENCOUNTER — Other Ambulatory Visit: Payer: Self-pay | Admitting: Nurse Practitioner

## 2024-01-20 ENCOUNTER — Other Ambulatory Visit (HOSPITAL_COMMUNITY): Payer: Self-pay

## 2024-01-20 DIAGNOSIS — D571 Sickle-cell disease without crisis: Secondary | ICD-10-CM

## 2024-01-20 DIAGNOSIS — G894 Chronic pain syndrome: Secondary | ICD-10-CM

## 2024-01-20 MED ORDER — OXYCODONE-ACETAMINOPHEN 10-325 MG PO TABS
1.0000 | ORAL_TABLET | Freq: Four times a day (QID) | ORAL | 0 refills | Status: DC | PRN
  Filled 2024-01-21: qty 60, 15d supply, fill #0

## 2024-01-20 NOTE — Progress Notes (Signed)
 Reviewed PDMP substance reporting system prior to prescribing opiate medications. No inconsistencies noted.      1. Sickle cell disease without crisis (HCC)  - oxyCODONE-acetaminophen (PERCOCET) 10-325 MG tablet; Take 1 tablet by mouth every 6 (six) hours as needed for up to 15 days for pain.  Dispense: 60 tablet; Refill: 0  2. Chronic pain syndrome  - oxyCODONE-acetaminophen (PERCOCET) 10-325 MG tablet; Take 1 tablet by mouth every 6 (six) hours as needed for up to 15 days for pain.  Dispense: 60 tablet; Refill: 0

## 2024-01-21 ENCOUNTER — Other Ambulatory Visit (HOSPITAL_COMMUNITY): Payer: Self-pay

## 2024-01-31 ENCOUNTER — Other Ambulatory Visit: Payer: Self-pay | Admitting: Nurse Practitioner

## 2024-01-31 DIAGNOSIS — G894 Chronic pain syndrome: Secondary | ICD-10-CM

## 2024-01-31 DIAGNOSIS — D571 Sickle-cell disease without crisis: Secondary | ICD-10-CM

## 2024-01-31 NOTE — Telephone Encounter (Signed)
 Please advise in Fola"s absence. Thanks [employer]

## 2024-02-02 ENCOUNTER — Other Ambulatory Visit: Payer: Self-pay | Admitting: Nurse Practitioner

## 2024-02-02 DIAGNOSIS — D571 Sickle-cell disease without crisis: Secondary | ICD-10-CM

## 2024-02-02 DIAGNOSIS — G894 Chronic pain syndrome: Secondary | ICD-10-CM

## 2024-02-03 ENCOUNTER — Other Ambulatory Visit (HOSPITAL_COMMUNITY): Payer: Self-pay

## 2024-02-03 MED ORDER — OXYCODONE-ACETAMINOPHEN 10-325 MG PO TABS
1.0000 | ORAL_TABLET | Freq: Four times a day (QID) | ORAL | 0 refills | Status: DC | PRN
  Filled 2024-02-04: qty 60, 15d supply, fill #0

## 2024-02-04 ENCOUNTER — Other Ambulatory Visit (HOSPITAL_COMMUNITY): Payer: Self-pay

## 2024-02-14 ENCOUNTER — Other Ambulatory Visit (HOSPITAL_COMMUNITY): Payer: Self-pay

## 2024-02-14 ENCOUNTER — Other Ambulatory Visit: Payer: Self-pay | Admitting: Nurse Practitioner

## 2024-02-14 DIAGNOSIS — D571 Sickle-cell disease without crisis: Secondary | ICD-10-CM

## 2024-02-14 DIAGNOSIS — G894 Chronic pain syndrome: Secondary | ICD-10-CM

## 2024-02-14 MED ORDER — OXYCODONE-ACETAMINOPHEN 10-325 MG PO TABS
1.0000 | ORAL_TABLET | Freq: Four times a day (QID) | ORAL | 0 refills | Status: DC | PRN
  Filled 2024-02-19: qty 60, 15d supply, fill #0
  Filled ????-??-??: fill #0

## 2024-02-14 NOTE — Progress Notes (Signed)
 Reviewed PDMP substance reporting system prior to prescribing opiate medications. No inconsistencies noted.      1. Sickle cell disease without crisis (HCC)  - oxyCODONE-acetaminophen (PERCOCET) 10-325 MG tablet; Take 1 tablet by mouth every 6 (six) hours as needed for up to 15 days for pain.  Dispense: 60 tablet; Refill: 0  2. Chronic pain syndrome  - oxyCODONE-acetaminophen (PERCOCET) 10-325 MG tablet; Take 1 tablet by mouth every 6 (six) hours as needed for up to 15 days for pain.  Dispense: 60 tablet; Refill: 0

## 2024-02-16 ENCOUNTER — Other Ambulatory Visit: Payer: Self-pay | Admitting: Nurse Practitioner

## 2024-02-16 DIAGNOSIS — G894 Chronic pain syndrome: Secondary | ICD-10-CM

## 2024-02-17 ENCOUNTER — Other Ambulatory Visit (HOSPITAL_COMMUNITY): Payer: Self-pay

## 2024-02-17 MED ORDER — IBUPROFEN 800 MG PO TABS
800.0000 mg | ORAL_TABLET | Freq: Three times a day (TID) | ORAL | 1 refills | Status: DC | PRN
  Filled 2024-02-17: qty 60, 20d supply, fill #0

## 2024-02-17 NOTE — Telephone Encounter (Signed)
 Please advise [facility]

## 2024-02-19 ENCOUNTER — Other Ambulatory Visit (HOSPITAL_COMMUNITY): Payer: Self-pay

## 2024-03-02 ENCOUNTER — Other Ambulatory Visit: Payer: Self-pay | Admitting: Nurse Practitioner

## 2024-03-02 ENCOUNTER — Other Ambulatory Visit (HOSPITAL_COMMUNITY): Payer: Self-pay

## 2024-03-02 DIAGNOSIS — G894 Chronic pain syndrome: Secondary | ICD-10-CM

## 2024-03-02 DIAGNOSIS — D571 Sickle-cell disease without crisis: Secondary | ICD-10-CM

## 2024-03-02 MED ORDER — OXYCODONE-ACETAMINOPHEN 10-325 MG PO TABS
1.0000 | ORAL_TABLET | Freq: Four times a day (QID) | ORAL | 0 refills | Status: DC | PRN
  Filled 2024-03-05: qty 60, 15d supply, fill #0

## 2024-03-02 NOTE — Progress Notes (Signed)
 Reviewed PDMP substance reporting system prior to prescribing opiate medications. No inconsistencies noted.      1. Sickle cell disease without crisis (HCC)  - oxyCODONE-acetaminophen (PERCOCET) 10-325 MG tablet; Take 1 tablet by mouth every 6 (six) hours as needed for up to 15 days for pain.  Dispense: 60 tablet; Refill: 0  2. Chronic pain syndrome  - oxyCODONE-acetaminophen (PERCOCET) 10-325 MG tablet; Take 1 tablet by mouth every 6 (six) hours as needed for up to 15 days for pain.  Dispense: 60 tablet; Refill: 0

## 2024-03-05 ENCOUNTER — Other Ambulatory Visit (HOSPITAL_COMMUNITY): Payer: Self-pay

## 2024-03-09 ENCOUNTER — Other Ambulatory Visit: Payer: Self-pay

## 2024-03-09 ENCOUNTER — Encounter (HOSPITAL_COMMUNITY): Payer: Self-pay | Admitting: Emergency Medicine

## 2024-03-09 ENCOUNTER — Emergency Department (HOSPITAL_COMMUNITY)
Admission: EM | Admit: 2024-03-09 | Discharge: 2024-03-10 | Disposition: A | Discharge status: Home / Self Care | Source: Home / Self Care | Attending: Emergency Medicine | Admitting: Emergency Medicine

## 2024-03-09 DIAGNOSIS — N390 Urinary tract infection, site not specified: Secondary | ICD-10-CM | POA: Insufficient documentation

## 2024-03-09 DIAGNOSIS — Z5900 Homelessness unspecified: Secondary | ICD-10-CM | POA: Insufficient documentation

## 2024-03-09 DIAGNOSIS — F17200 Nicotine dependence, unspecified, uncomplicated: Secondary | ICD-10-CM | POA: Insufficient documentation

## 2024-03-09 DIAGNOSIS — A419 Sepsis, unspecified organism: Secondary | ICD-10-CM | POA: Diagnosis not present

## 2024-03-09 LAB — CBC WITH DIFFERENTIAL/PLATELET
Abs Immature Granulocytes: 0.07 10*3/uL (ref 0.00–0.07)
Basophils Absolute: 0 10*3/uL (ref 0.0–0.1)
Basophils Relative: 0 %
Eosinophils Absolute: 0.2 10*3/uL (ref 0.0–0.5)
Eosinophils Relative: 2 %
HCT: 19.3 % — ABNORMAL LOW (ref 39.0–52.0)
Hemoglobin: 6.6 g/dL — CL (ref 13.0–17.0)
Immature Granulocytes: 1 %
Lymphocytes Relative: 14 %
Lymphs Abs: 1.8 10*3/uL (ref 0.7–4.0)
MCH: 32.7 pg (ref 26.0–34.0)
MCHC: 34.2 g/dL (ref 30.0–36.0)
MCV: 95.5 fL (ref 80.0–100.0)
Monocytes Absolute: 1 10*3/uL (ref 0.1–1.0)
Monocytes Relative: 8 %
Neutro Abs: 9.5 10*3/uL — ABNORMAL HIGH (ref 1.7–7.7)
Neutrophils Relative %: 75 %
Platelets: 160 10*3/uL (ref 150–400)
RBC: 2.02 MIL/uL — ABNORMAL LOW (ref 4.22–5.81)
RDW: 26.2 % — ABNORMAL HIGH (ref 11.5–15.5)
WBC: 12.6 10*3/uL — ABNORMAL HIGH (ref 4.0–10.5)
nRBC: 3.9 % — ABNORMAL HIGH (ref 0.0–0.2)

## 2024-03-09 LAB — COMPREHENSIVE METABOLIC PANEL WITH GFR
ALT: 32 U/L (ref 0–44)
AST: 44 U/L — ABNORMAL HIGH (ref 15–41)
Albumin: 4 g/dL (ref 3.5–5.0)
Alkaline Phosphatase: 221 U/L — ABNORMAL HIGH (ref 38–126)
Anion gap: 7 (ref 5–15)
BUN: 20 mg/dL (ref 6–20)
CO2: 19 mmol/L — ABNORMAL LOW (ref 22–32)
Calcium: 9.1 mg/dL (ref 8.9–10.3)
Chloride: 109 mmol/L (ref 98–111)
Creatinine, Ser: 1.59 mg/dL — ABNORMAL HIGH (ref 0.61–1.24)
GFR, Estimated: 50 mL/min — ABNORMAL LOW (ref >60)
Glucose, Bld: 118 mg/dL — ABNORMAL HIGH (ref 70–99)
Potassium: 4.7 mmol/L (ref 3.5–5.1)
Sodium: 135 mmol/L (ref 135–145)
Total Bilirubin: 2.8 mg/dL — ABNORMAL HIGH (ref 0.0–1.2)
Total Protein: 7.1 g/dL (ref 6.5–8.1)

## 2024-03-09 LAB — URINALYSIS, ROUTINE W REFLEX MICROSCOPIC
Bacteria, UA: NONE SEEN
Bilirubin Urine: NEGATIVE
Glucose, UA: NEGATIVE mg/dL
Ketones, ur: NEGATIVE mg/dL
Nitrite: POSITIVE — AB
Protein, ur: 100 mg/dL — AB
Specific Gravity, Urine: 1.013 (ref 1.005–1.030)
WBC, UA: >50 WBC/hpf (ref 0–5)
pH: 5 (ref 5.0–8.0)

## 2024-03-09 MED ORDER — CEPHALEXIN 500 MG PO CAPS
500.0000 mg | ORAL_CAPSULE | Freq: Four times a day (QID) | ORAL | 0 refills | Status: DC
  Filled 2024-03-09 – 2024-03-10 (×2): qty 28, 7d supply, fill #0

## 2024-03-09 MED ORDER — SODIUM CHLORIDE 0.9 % IV SOLN
1.0000 g | Freq: Once | INTRAVENOUS | Status: AC
  Administered 2024-03-09: 1 g via INTRAVENOUS
  Filled 2024-03-09: qty 10

## 2024-03-09 NOTE — ED Provider Triage Note (Signed)
 Emergency Medicine Provider Triage Evaluation Note  Scott Norton , a 58 y.o. male  was evaluated in triage.  Pt complains of dysuria.  Patient reports that he has been experiencing dysuria and left-sided CVA tenderness for last few days.  Denies any obvious hematuria but reports burning while urinating.  He reports being sexually active with 1 partner and denies any concerns for STIs at this time.  Denies any penile discharge.  No testicular pain.  Review of Systems  Positive: As above Negative: As above  Physical Exam  BP 106/60   Pulse 64   Temp 98 F (36.7 C)   Resp 16   Ht 6\' 3"  (1.905 m)   Wt 74.8 kg   SpO2 100%   BMI 20.62 kg/m  Gen:   Awake, no distress   Resp:  Normal effort  MSK:   Moves extremities without difficulty  Other:  No abdominal tenderness, no CVA tenderness  Medical Decision Making  Medically screening exam initiated at 8:32 PM.  Appropriate orders placed.  Scott Norton was informed that the remainder of the evaluation will be completed by another provider, this initial triage assessment does not replace that evaluation, and the importance of remaining in the ED until their evaluation is complete.     Scott Norton A, PA-C 03/09/24 2033

## 2024-03-09 NOTE — ED Provider Notes (Signed)
 [place] EMERGENCY DEPARTMENT AT [facility] Provider Note   CSN: [id number] Arrival date & time: 03/09/24  1948     History  Chief Complaint  Patient presents with   Dysuria   Abdominal Pain    Scott Norton is a 58 y.o. male.  Patient presents the emergency room complaining of 2 days of dysuria.  He denies fever, nausea, vomiting, chills, chest pain, shortness of breath.  Patient with past medical history significant for sickle cell anemia, homelessness, cocaine abuse   Dysuria Presenting symptoms: dysuria   Associated symptoms: abdominal pain   Abdominal Pain Associated symptoms: dysuria        Home Medications Prior to Admission medications   Medication Sig Start Date End Date Taking? Authorizing Provider  cephALEXin  (KEFLEX ) 500 MG capsule Take 1 capsule (500 mg total) by mouth 4 (four) times daily. 03/09/24  Yes Abelardo Hoehn B, PA-C  Vitamin D , Ergocalciferol , (DRISDOL ) 1.25 MG (50000 UNIT) CAPS capsule Take 1 capsule (50,000 Units total) by mouth every 7 (seven) days. 12/20/23   Paseda, Folashade R, FNP  ibuprofen  (ADVIL ) 800 MG tablet Take 1 tablet by mouth every 8 hours as needed. 02/17/24   Paseda, Folashade R, FNP  oxyCODONE -acetaminophen  (PERCOCET) 10-325 MG tablet Take 1 tablet by mouth every 6 (six) hours as needed for up to 15 days for pain. 03/05/24 03/20/24  Paseda, Folashade R, FNP      Allergies    Aspirin, Tape, and Tramadol     Review of Systems   Review of Systems  Gastrointestinal:  Positive for abdominal pain.  Genitourinary:  Positive for dysuria.    Physical Exam Updated Vital Signs BP (!) 118/54   Pulse 81   Temp 97.8 F (36.6 C) (Oral)   Resp 17   Ht 6\' 3"  (1.905 m)   Wt 74.8 kg   SpO2 100%   BMI 20.62 kg/m  Physical Exam Vitals and nursing note reviewed.  Constitutional:      General: He is not in acute distress.    Appearance: He is well-developed.  HENT:     Head: Normocephalic and atraumatic.  Eyes:      Conjunctiva/sclera: Conjunctivae normal.  Cardiovascular:     Rate and Rhythm: Normal rate and regular rhythm.  Pulmonary:     Effort: Pulmonary effort is normal. No respiratory distress.     Breath sounds: Normal breath sounds.  Abdominal:     Palpations: Abdomen is soft.     Tenderness: There is no abdominal tenderness.  Musculoskeletal:        General: No swelling.     Cervical back: Neck supple.  Skin:    General: Skin is warm and dry.     Capillary Refill: Capillary refill takes less than 2 seconds.  Neurological:     Mental Status: He is alert.  Psychiatric:        Mood and Affect: Mood normal.     ED Results / Procedures / Treatments   Labs (all labs ordered are listed, but only abnormal results are displayed) Labs Reviewed  CBC WITH DIFFERENTIAL/PLATELET - Abnormal; Notable for the following components:      Result Value   WBC 12.6 (*)    RBC 2.02 (*)    Hemoglobin 6.6 (*)    HCT 19.3 (*)    RDW 26.2 (*)    nRBC 3.9 (*)    Neutro Abs 9.5 (*)    All other components within normal limits  COMPREHENSIVE METABOLIC PANEL  WITH GFR - Abnormal; Notable for the following components:   CO2 19 (*)    Glucose, Bld 118 (*)    Creatinine, Ser 1.59 (*)    AST 44 (*)    Alkaline Phosphatase 221 (*)    Total Bilirubin 2.8 (*)    GFR, Estimated 50 (*)    All other components within normal limits  URINALYSIS, ROUTINE W REFLEX MICROSCOPIC - Abnormal; Notable for the following components:   Color, Urine AMBER (*)    APPearance CLOUDY (*)    Hgb urine dipstick SMALL (*)    Protein, ur 100 (*)    Nitrite POSITIVE (*)    Leukocytes,Ua LARGE (*)    All other components within normal limits  GC/CHLAMYDIA PROBE AMP ([place]) NOT AT [facility]    EKG None  Radiology No results found.  Procedures Procedures    Medications Ordered in ED Medications  cefTRIAXone  (ROCEPHIN ) 1 g in sodium chloride  0.9 % 100 mL IVPB (0 g Intravenous Stopped 03/09/24 2346)    ED Course/  Medical Decision Making/ A&P                                 Medical Decision Making  This patient presents to the ED for concern of dysuria, this involves an extensive number of treatment options, and is a complaint that carries with it a high risk of complications and morbidity.  The differential diagnosis includes urinary tract infection, urethritis, STI, pyelonephritis, others   Co morbidities that complicate the patient evaluation  Sickle cell anemia   Additional history obtained:  External records from outside source obtained and reviewed including discharge summary from November, notes showing baseline Hgb between 6.5 to 8   Lab Tests:  I Ordered, and personally interpreted labs.  The pertinent results include: Hemoglobin 6.6 grossly at baseline, elevated alk phos and T. bili, both within patient's normal ranges; UA with small hemoglobin, protein, nitrite positive, large leukocytes, greater than 50 WBC   Problem List / ED Course / Critical interventions / Medication management   I ordered medication including Rocephin  for antibiotic coverage Reevaluation of the patient after these medicines showed that the patient stayed the same I have reviewed the patients home medicines and have made adjustments as needed  Social Determinants of Health:  Patient is an occasional tobacco smoker, has issues with housing stability   Test / Admission - Considered:  Patient with evidence of a urinary tract infection.  Was treated with a dose of Rocephin  while in the emergency department.  Plan to discharge home a course of Keflex .  Patient with low hemoglobin but upon chart review it appears that this is within patient's baseline range.  He denies pain or complaints related to his sickle cell disease at this time.  No indication for further emergent workup.  Patient stable for discharge home at this time.         Final Clinical Impression(s) / ED Diagnoses Final diagnoses:  Lower  urinary tract infectious disease    Rx / DC Orders ED Discharge Orders          Ordered    cephALEXin  (KEFLEX ) 500 MG capsule  4 times daily        03/09/24 2359              Delories Fetter 03/10/24 0000    Onetha Bile, MD 03/10/24 [phone]

## 2024-03-09 NOTE — Discharge Instructions (Addendum)
 You were diagnosed today with a urinary tract infection.  Please take the prescribed antibiotics until they are complete.  Follow-up as new with your primary care team.  If you develop any life-threatening symptoms please return to the emergency department.

## 2024-03-09 NOTE — ED Triage Notes (Signed)
 Patient c/o abdominal pain x 2 days. Patient report dysuria today. Patient denies N/V. Patient denies fever.

## 2024-03-10 ENCOUNTER — Other Ambulatory Visit (HOSPITAL_COMMUNITY): Payer: Self-pay

## 2024-03-11 ENCOUNTER — Inpatient Hospital Stay (HOSPITAL_COMMUNITY)
Admission: EM | Admit: 2024-03-11 | Discharge: 2024-03-17 | DRG: 871 | Disposition: A | Discharge status: Home / Self Care | Attending: Internal Medicine | Admitting: Internal Medicine

## 2024-03-11 ENCOUNTER — Emergency Department (HOSPITAL_COMMUNITY)

## 2024-03-11 ENCOUNTER — Telehealth: Payer: Self-pay

## 2024-03-11 ENCOUNTER — Encounter (HOSPITAL_COMMUNITY): Payer: Self-pay

## 2024-03-11 ENCOUNTER — Other Ambulatory Visit: Payer: Self-pay

## 2024-03-11 DIAGNOSIS — N1831 Chronic kidney disease, stage 3a: Secondary | ICD-10-CM | POA: Diagnosis present

## 2024-03-11 DIAGNOSIS — D5700 Hb-SS disease with crisis, unspecified: Secondary | ICD-10-CM | POA: Diagnosis present

## 2024-03-11 DIAGNOSIS — Z5900 Homelessness unspecified: Secondary | ICD-10-CM

## 2024-03-11 DIAGNOSIS — Z886 Allergy status to analgesic agent status: Secondary | ICD-10-CM

## 2024-03-11 DIAGNOSIS — N390 Urinary tract infection, site not specified: Principal | ICD-10-CM

## 2024-03-11 DIAGNOSIS — Z832 Family history of diseases of the blood and blood-forming organs and certain disorders involving the immune mechanism: Secondary | ICD-10-CM

## 2024-03-11 DIAGNOSIS — N12 Tubulo-interstitial nephritis, not specified as acute or chronic: Secondary | ICD-10-CM | POA: Diagnosis present

## 2024-03-11 DIAGNOSIS — Z79899 Other long term (current) drug therapy: Secondary | ICD-10-CM

## 2024-03-11 DIAGNOSIS — R932 Abnormal findings on diagnostic imaging of liver and biliary tract: Secondary | ICD-10-CM | POA: Diagnosis present

## 2024-03-11 DIAGNOSIS — I5032 Chronic diastolic (congestive) heart failure: Secondary | ICD-10-CM | POA: Diagnosis present

## 2024-03-11 DIAGNOSIS — D571 Sickle-cell disease without crisis: Secondary | ICD-10-CM | POA: Diagnosis present

## 2024-03-11 DIAGNOSIS — Z8249 Family history of ischemic heart disease and other diseases of the circulatory system: Secondary | ICD-10-CM

## 2024-03-11 DIAGNOSIS — F17290 Nicotine dependence, other tobacco product, uncomplicated: Secondary | ICD-10-CM | POA: Diagnosis present

## 2024-03-11 DIAGNOSIS — Z888 Allergy status to other drugs, medicaments and biological substances status: Secondary | ICD-10-CM

## 2024-03-11 DIAGNOSIS — A419 Sepsis, unspecified organism: Principal | ICD-10-CM | POA: Diagnosis present

## 2024-03-11 DIAGNOSIS — Z1152 Encounter for screening for COVID-19: Secondary | ICD-10-CM

## 2024-03-11 DIAGNOSIS — D638 Anemia in other chronic diseases classified elsewhere: Secondary | ICD-10-CM | POA: Diagnosis present

## 2024-03-11 DIAGNOSIS — D631 Anemia in chronic kidney disease: Secondary | ICD-10-CM | POA: Diagnosis present

## 2024-03-11 DIAGNOSIS — G894 Chronic pain syndrome: Secondary | ICD-10-CM | POA: Diagnosis present

## 2024-03-11 LAB — BASIC METABOLIC PANEL WITH GFR
Anion gap: 6 (ref 5–15)
BUN: 26 mg/dL — ABNORMAL HIGH (ref 6–20)
CO2: 18 mmol/L — ABNORMAL LOW (ref 22–32)
Calcium: 8.9 mg/dL (ref 8.9–10.3)
Chloride: 108 mmol/L (ref 98–111)
Creatinine, Ser: 1.62 mg/dL — ABNORMAL HIGH (ref 0.61–1.24)
GFR, Estimated: 49 mL/min — ABNORMAL LOW (ref >60)
Glucose, Bld: 104 mg/dL — ABNORMAL HIGH (ref 70–99)
Potassium: 4.8 mmol/L (ref 3.5–5.1)
Sodium: 132 mmol/L — ABNORMAL LOW (ref 135–145)

## 2024-03-11 LAB — CBC WITH DIFFERENTIAL/PLATELET
Abs Immature Granulocytes: 0.12 10*3/uL — ABNORMAL HIGH (ref 0.00–0.07)
Basophils Absolute: 0 10*3/uL (ref 0.0–0.1)
Basophils Relative: 0 %
Eosinophils Absolute: 0 10*3/uL (ref 0.0–0.5)
Eosinophils Relative: 0 %
HCT: 15.4 % — ABNORMAL LOW (ref 39.0–52.0)
Hemoglobin: 5.6 g/dL — CL (ref 13.0–17.0)
Immature Granulocytes: 1 %
Lymphocytes Relative: 14 %
Lymphs Abs: 1.9 10*3/uL (ref 0.7–4.0)
MCH: 33.1 pg (ref 26.0–34.0)
MCHC: 36.4 g/dL — ABNORMAL HIGH (ref 30.0–36.0)
MCV: 91.1 fL (ref 80.0–100.0)
Monocytes Absolute: 1.3 10*3/uL — ABNORMAL HIGH (ref 0.1–1.0)
Monocytes Relative: 9 %
Neutro Abs: 10.6 10*3/uL — ABNORMAL HIGH (ref 1.7–7.7)
Neutrophils Relative %: 76 %
Platelets: 170 10*3/uL (ref 150–400)
RBC: 1.69 MIL/uL — ABNORMAL LOW (ref 4.22–5.81)
RDW: 25.5 % — ABNORMAL HIGH (ref 11.5–15.5)
WBC: 13.9 10*3/uL — ABNORMAL HIGH (ref 4.0–10.5)
nRBC: 5 % — ABNORMAL HIGH (ref 0.0–0.2)

## 2024-03-11 LAB — I-STAT CG4 LACTIC ACID, ED: Lactic Acid, Venous: 0.6 mmol/L (ref 0.5–1.9)

## 2024-03-11 LAB — RESP PANEL BY RT-PCR (RSV, FLU A&B, COVID)  RVPGX2
Influenza A by PCR: NEGATIVE
Influenza B by PCR: NEGATIVE
Resp Syncytial Virus by PCR: NEGATIVE
SARS Coronavirus 2 by RT PCR: NEGATIVE

## 2024-03-11 LAB — HEPATIC FUNCTION PANEL
ALT: 24 U/L (ref 0–44)
AST: 38 U/L (ref 15–41)
Albumin: 3.5 g/dL (ref 3.5–5.0)
Alkaline Phosphatase: 219 U/L — ABNORMAL HIGH (ref 38–126)
Bilirubin, Direct: 1.3 mg/dL — ABNORMAL HIGH (ref 0.0–0.2)
Indirect Bilirubin: 3.2 mg/dL — ABNORMAL HIGH (ref 0.3–0.9)
Total Bilirubin: 4.5 mg/dL — ABNORMAL HIGH (ref 0.0–1.2)
Total Protein: 6.4 g/dL — ABNORMAL LOW (ref 6.5–8.1)

## 2024-03-11 LAB — GC/CHLAMYDIA PROBE AMP (~~LOC~~) NOT AT ARMC
Chlamydia: NEGATIVE
Comment: NEGATIVE
Comment: NORMAL
Neisseria Gonorrhea: NEGATIVE

## 2024-03-11 LAB — PREPARE RBC (CROSSMATCH)

## 2024-03-11 MED ORDER — HYDROMORPHONE HCL 1 MG/ML IJ SOLN
1.0000 mg | INTRAMUSCULAR | Status: DC | PRN
  Administered 2024-03-12 (×2): 1 mg via INTRAVENOUS
  Filled 2024-03-11 (×2): qty 1

## 2024-03-11 MED ORDER — SODIUM CHLORIDE 0.9% IV SOLUTION: Freq: Once | INTRAVENOUS | Status: DC

## 2024-03-11 MED ORDER — ACETAMINOPHEN 325 MG PO TABS
650.0000 mg | ORAL_TABLET | Freq: Four times a day (QID) | ORAL | Status: DC | PRN
  Administered 2024-03-12 (×2): 650 mg via ORAL
  Filled 2024-03-11 (×2): qty 2

## 2024-03-11 MED ORDER — LACTATED RINGERS IV BOLUS (SEPSIS)
1000.0000 mL | Freq: Once | INTRAVENOUS | Status: AC
  Administered 2024-03-11: 1000 mL via INTRAVENOUS

## 2024-03-11 MED ORDER — ENOXAPARIN SODIUM 40 MG/0.4ML IJ SOSY
40.0000 mg | PREFILLED_SYRINGE | INTRAMUSCULAR | Status: DC
  Administered 2024-03-12 – 2024-03-17 (×6): 40 mg via SUBCUTANEOUS
  Filled 2024-03-11 (×6): qty 0.4

## 2024-03-11 MED ORDER — LACTATED RINGERS IV SOLN
150.0000 mL/h | INTRAVENOUS | Status: DC
  Administered 2024-03-11 – 2024-03-12 (×2): 150 mL/h via INTRAVENOUS

## 2024-03-11 MED ORDER — SODIUM CHLORIDE 0.9 % IV SOLN
2.0000 g | INTRAVENOUS | Status: DC
  Administered 2024-03-11: 2 g via INTRAVENOUS
  Filled 2024-03-11: qty 20

## 2024-03-11 MED ORDER — ONDANSETRON HCL 4 MG/2ML IJ SOLN: 4.0000 mg | Freq: Four times a day (QID) | INTRAMUSCULAR | Status: DC | PRN

## 2024-03-11 MED ORDER — OXYCODONE HCL 5 MG PO TABS
5.0000 mg | ORAL_TABLET | Freq: Four times a day (QID) | ORAL | Status: DC | PRN
  Administered 2024-03-12 (×3): 5 mg via ORAL
  Filled 2024-03-11 (×3): qty 1

## 2024-03-11 MED ORDER — SODIUM CHLORIDE 0.9% FLUSH
3.0000 mL | Freq: Two times a day (BID) | INTRAVENOUS | Status: DC
  Administered 2024-03-11 – 2024-03-17 (×6): 3 mL via INTRAVENOUS

## 2024-03-11 MED ORDER — ACETAMINOPHEN 325 MG PO TABS
650.0000 mg | ORAL_TABLET | Freq: Once | ORAL | Status: AC
  Administered 2024-03-11: 650 mg via ORAL
  Filled 2024-03-11: qty 2

## 2024-03-11 MED ORDER — OXYCODONE-ACETAMINOPHEN 10-325 MG PO TABS: 1.0000 | ORAL_TABLET | Freq: Four times a day (QID) | ORAL | Status: DC | PRN

## 2024-03-11 MED ORDER — SENNOSIDES-DOCUSATE SODIUM 8.6-50 MG PO TABS: 1.0000 | ORAL_TABLET | Freq: Every evening | ORAL | Status: DC | PRN

## 2024-03-11 MED ORDER — ONDANSETRON HCL 4 MG PO TABS: 4.0000 mg | ORAL_TABLET | Freq: Four times a day (QID) | ORAL | Status: DC | PRN

## 2024-03-11 MED ORDER — ACETAMINOPHEN 650 MG RE SUPP: 650.0000 mg | Freq: Four times a day (QID) | RECTAL | Status: DC | PRN

## 2024-03-11 MED ORDER — OXYCODONE-ACETAMINOPHEN 5-325 MG PO TABS
1.0000 | ORAL_TABLET | Freq: Four times a day (QID) | ORAL | Status: DC | PRN
  Administered 2024-03-12 – 2024-03-17 (×5): 1 via ORAL
  Filled 2024-03-11 (×5): qty 1

## 2024-03-11 NOTE — ED Provider Triage Note (Signed)
 Emergency Medicine Provider Triage Evaluation Note  Scott Norton , a 58 y.o. male  was evaluated in triage.  Pt complains of fatigue.  Patient reports fatigue, headache, and left side pain since yesterday.  Took a full days worth of antibiotics for his UTI with some improvement of urinary symptoms and side pain.  Review of Systems  Positive: Headache, left side pain Negative: N/V/D  Physical Exam  BP 101/64   Pulse 90   Temp (!) 102.5 F (39.2 C) (Oral)   Resp 18   SpO2 92%  Gen:   Awake, no distress   Resp:  Normal effort  MSK:   Moves extremities without difficulty  Other:    Medical Decision Making  Medically screening exam initiated at 7:25 PM.  Appropriate orders placed.  [name] was informed that the remainder of the evaluation will be completed by another provider, this initial triage assessment does not replace that evaluation, and the importance of remaining in the ED until their evaluation is complete.     Sonnie Dusky, PA-C 03/11/24 1925

## 2024-03-11 NOTE — ED Triage Notes (Signed)
 Pt arrives with reports of continued left side flank pain. Pt reports being seen recently and dx with UTI and has been taking abx. Pt reports feeling fatigued as well and has had low hgb in the past, states it was low on blood work yesterday but did not receive transfusion.

## 2024-03-11 NOTE — H&P (Signed)
 History and Physical    Scott Norton ZOX:[id number] DOB: 11-01-1965 DOA: 03/11/2024  PCP: Paseda, Folashade R, FNP  Patient coming from: Home  I have personally briefly reviewed patient's old medical records in [facility]  Chief Complaint: UTI, chills, left flank pain  HPI: Scott Norton is a 58 y.o. male with medical history significant for sickle cell disease, anemia of chronic disease, chronic pain syndrome, CKD stage IIIa, history of substance use who presented to the ED for evaluation of UTI, chills, and left flank pain.  Patient was recently seen in the ED night of 5/19 for dysuria.  UA showed positive nitrites, large leukocytes, >50 WBCs, 0-5 RBCs and no bacteria on microscopy.  Urine culture not obtained.  GC/chlamydia was negative.  He was given ceftriaxone  for treatment of UTI and discharged on a course of Keflex .  Labs were also notable for WBC 12.6 and hemoglobin 6.6.  Patient states that he has been taking the antibiotics as prescribed.  He has been feeling more fatigued than usual.  He has been having shaking chills and left-sided flank pain with ongoing urinary symptoms.  He reports a little bit of sore throat and cough but no dyspnea.  He denies chest pain, nausea, vomiting, abdominal pain.  He has not seen any obvious bleeding.  ED Course  Labs/Imaging on admission: I have personally reviewed following labs and imaging studies.  Initial vitals showed BP 101/64, pulse 90, RR 18, temp 102.5 F, SpO2 92% on room air.  Labs showed WBC 13.9, hemoglobin 5.6, platelets 170, sodium 132, potassium 4.8, bicarb 18, BUN 26, creatinine 1.62, serum glucose 104, lactic acid 0.6.  AST 38, ALT 24, alk phos 219, total bilirubin 4.5.  Blood cultures in process.  CT renal stone study negative for hydronephrosis or ureteral stone.  Lobulated contour of the right greater than left kidney noted potentially due to cortical scarring.  Enlarged appearing left hepatic lobe with question of  surface nodularity of liver.  Focal lobulated liver contour versus a mass at the posteroinferior right hepatic lobe also seen.  Cardiomegaly and trace pericardial effusion reported.  Patient was given IV ceftriaxone  and the hospitalist service was consulted to admit.  Review of Systems: All systems reviewed and are negative except as documented in history of present illness above.   Past Medical History:  Diagnosis Date   AKI (acute kidney injury) (HCC) 02/08/2021   Anemia    Cocaine abuse (HCC) 02/08/2021   Depression    Heart murmur    Seasonal allergies    wool & grass   Sickle cell anemia (HCC)    Suicidal ideation    Tobacco use disorder 11/06/2023    Past Surgical History:  Procedure Laterality Date   NO PAST SURGERIES     ROOT CANAL      Social History: Patient reports smoking a couple black and milds daily.  Allergies  Allergen Reactions   Aspirin Other (See Comments)    Increased heart rate   Tape Rash   Tramadol  Hives and Itching    Family History  Problem Relation Age of Onset   Alcohol  abuse Mother    Sickle cell trait Mother    Hypertension Mother    Alcohol  abuse Father    Sickle cell trait Father    Early death Father        due to alcoholism   Hypertension Father    Hypertension Sister    Hypertension Brother    Hypertension Maternal  Grandmother    Hypertension Maternal Grandfather    Hypertension Paternal Grandmother    Hypertension Paternal Grandfather      Prior to Admission medications   Medication Sig Start Date End Date Taking? Authorizing Provider  cephALEXin  (KEFLEX ) 500 MG capsule Take 1 capsule (500 mg total) by mouth 4 (four) times daily. 03/09/24   Elisa Guest, PA-C  ibuprofen  (ADVIL ) 800 MG tablet Take 1 tablet by mouth every 8 hours as needed. 02/17/24   Paseda, Folashade R, FNP  oxyCODONE -acetaminophen  (PERCOCET) 10-325 MG tablet Take 1 tablet by mouth every 6 (six) hours as needed for up to 15 days for pain. 03/05/24  03/20/24  Paseda, Folashade R, FNP  Vitamin D , Ergocalciferol , (DRISDOL ) 1.25 MG (50000 UNIT) CAPS capsule Take 1 capsule (50,000 Units total) by mouth every 7 (seven) days. 12/20/23   Paseda, Folashade R, FNP    Physical Exam: Vitals:   03/11/24 1911 03/11/24 2145  BP: 101/64 (!) 97/54  Pulse: 90 61  Resp: 18 16  Temp: (!) 102.5 F (39.2 C) 98.3 F (36.8 C)  TempSrc: Oral Oral  SpO2: 92% 92%   Constitutional: Resting in bed, NAD, calm, comfortable Eyes: EOMI, lids and conjunctivae normal ENMT: Mucous membranes are moist. Posterior pharynx clear of any exudate or lesions.Normal dentition.  Neck: normal, supple, no masses. Respiratory: clear to auscultation bilaterally, no wheezing, no crackles. Normal respiratory effort. No accessory muscle use.  Cardiovascular: Regular rate and rhythm, no murmurs / rubs / gallops. No extremity edema. 2+ pedal pulses. Abdomen: no tenderness, no masses palpated. Musculoskeletal: no clubbing / cyanosis. No joint deformity upper and lower extremities. Good ROM, no contractures. Normal muscle tone.  Skin: no rashes, lesions, ulcers. No induration Neurologic: Sensation intact. Strength 5/5 in all 4.  Psychiatric: Normal judgment and insight. Alert and oriented x 3. Normal mood.   EKG: Not performed.  Assessment/Plan Principal Problem:   Sepsis due to urinary tract infection (HCC) Active Problems:   Sickle cell anemia (HCC)   Chronic kidney disease, stage 3a (HCC)   Abnormal liver CT   Scott Norton is a 58 y.o. male with medical history significant for sickle cell disease, anemia of chronic disease, chronic pain syndrome, CKD stage IIIa, history of substance use who is admitted with sepsis due to UTI.  Assessment and Plan: Sepsis due to UTI/left pyelonephritis: Patient presenting with leukocytosis, fever and recent diagnosis of UTI not improving on oral antibiotics.  History and symptomology suggestive of early left-sided pyelonephritis.  CT renal  stone study negative for hydronephrosis or ureteral stone. - Continue IV ceftriaxone  - Follow blood cultures, obtain urine culture - Check COVID/influenza/RSV panel - Give 2 L IV fluid bolus followed by maintenance fluid overnight  Acute on chronic anemia: Hemoglobin down to 5.6 from baseline ~7-8.  He has not seen any obvious bleeding.  He is being transfused 2 unit PRBCs.  Sickle cell disease with chronic pain syndrome: Not in acute pain crisis at time of admission.  Receiving 2 unit PRBC transfusion as above. - Continue home Percocet 10-325 mg every 6 hours as needed - IV Dilaudid  ordered as needed for breakthrough severe pain  CKD stage IIIa: Creatinine 1.62 on admission, relatively stable compared to recent baseline labs.  Continue to monitor.  Abnormal liver appearance on imaging: CT showed question of surface nodularity of the liver as well as focal lobulated liver contour versus mass at the posteroinferior right hepatic lobe.  Recommend consideration of outpatient abdominal MRI for further evaluation.  DVT prophylaxis: enoxaparin  (LOVENOX ) injection 40 mg Start: 03/11/24 2245 Code Status: Full code Family Communication: Significant other at bedside Disposition Plan: Pending clinical progress Consults called: None Severity of Illness: The appropriate patient status for this patient is OBSERVATION. Observation status is judged to be reasonable and necessary in order to provide the required intensity of service to ensure the patient's safety. The patient's presenting symptoms, physical exam findings, and initial radiographic and laboratory data in the context of their medical condition is felt to place them at decreased risk for further clinical deterioration. Furthermore, it is anticipated that the patient will be medically stable for discharge from the hospital within 2 midnights of admission.   Edith Gores MD [facility]  If 7PM-7AM, please contact  night-coverage [email or web address]  03/11/2024, 10:50 PM

## 2024-03-11 NOTE — Hospital Course (Signed)
 Scott Norton is a 58 y.o. male with medical history significant for sickle cell disease, anemia of chronic disease, chronic pain syndrome, CKD stage IIIa, history of substance use who is admitted with sepsis due to UTI.

## 2024-03-11 NOTE — Transitions of Care (Post Inpatient/ED Visit) (Unsigned)
   03/11/2024  Name: Scott Norton MRN: [id number] DOB: 03/24/1966  Today's TOC FU Call Status:   Patient's Name and Date of Birth confirmed.  Transition Care Management Follow-up Telephone Call Date of Discharge: 03/10/24 Discharge Facility: [facility]) Type of Discharge: Emergency Department Reason for ED Visit: Other: How have you been since you were released from the hospital?: Same Any questions or concerns?: No  Items Reviewed: Did you receive and understand the discharge instructions provided?: Yes Medications obtained,verified, and reconciled?: Yes (Medications Reviewed) Any new allergies since your discharge?: No Dietary orders reviewed?: No Do you have support at home?: Yes People in Home [RPT]: significant other  Medications Reviewed Today: Medications Reviewed Today     Reviewed by Angelita Bares, CMA (Certified Medical Assistant) on 03/11/24 at 1053  Med List Status: <None>   Medication Order Taking? Sig Documenting Provider Last Dose Status Informant  cephALEXin  (KEFLEX ) 500 MG capsule [id number] Yes Take 1 capsule (500 mg total) by mouth 4 (four) times daily. Elisa Guest, PA-C Taking Active   ibuprofen  (ADVIL ) 800 MG tablet [id number] Yes Take 1 tablet by mouth every 8 hours as needed. Paseda, Folashade R, FNP Taking Active   oxyCODONE -acetaminophen  (PERCOCET) 10-325 MG tablet [id number] Yes Take 1 tablet by mouth every 6 (six) hours as needed for up to 15 days for pain. Paseda, Folashade R, FNP Taking Active   Vitamin D , Ergocalciferol , (DRISDOL ) 1.25 MG (50000 UNIT) CAPS capsule [id number] Yes Take 1 capsule (50,000 Units total) by mouth every 7 (seven) days. Paseda, Folashade R, FNP Taking Active             Home Care and Equipment/Supplies: Were Home Health Services Ordered?: No Any new equipment or medical supplies ordered?: No  Functional Questionnaire: Do you need assistance with bathing/showering or dressing?: No Do you need assistance  with meal preparation?: No Do you need assistance with eating?: No Do you have difficulty maintaining continence: No Do you need assistance with getting out of bed/getting out of a chair/moving?: No Do you have difficulty managing or taking your medications?: No  Follow up appointments reviewed: PCP Follow-up appointment confirmed?: Yes Date of PCP follow-up appointment?: 03/18/24 Follow-up Provider: [facility] Follow-up appointment confirmed?: NA Do you need transportation to your follow-up appointment?: No Do you understand care options if your condition(s) worsen?: Yes-patient verbalized understanding    SIGNATURE Janiyah Beery, RMA

## 2024-03-11 NOTE — ED Provider Notes (Signed)
 [place] EMERGENCY DEPARTMENT AT [facility] Provider Note   CSN: [id number] Arrival date & time: 03/11/24  1850     History  Chief Complaint  Patient presents with   Fatigue   Flank Pain    Scott Norton is a 58 y.o. male.  58 year old male presents with fever and weakness.  Patient seen yesterday diagnosed with UTI.  He was given Rocephin  IV and placed on Keflex .  States that since that time he has noted some chills and rigors.  Denies any vomiting or diarrhea.  Does have a history of sickle cell disease.  On arrival here.  Temperature was 102.5.  Complains of left-sided flank pain which has been constant.  No prior history of renal colic       Home Medications Prior to Admission medications   Medication Sig Start Date End Date Taking? Authorizing Provider  cephALEXin  (KEFLEX ) 500 MG capsule Take 1 capsule (500 mg total) by mouth 4 (four) times daily. 03/09/24   Elisa Guest, PA-C  ibuprofen  (ADVIL ) 800 MG tablet Take 1 tablet by mouth every 8 hours as needed. 02/17/24   Paseda, Folashade R, FNP  oxyCODONE -acetaminophen  (PERCOCET) 10-325 MG tablet Take 1 tablet by mouth every 6 (six) hours as needed for up to 15 days for pain. 03/05/24 03/20/24  Paseda, Folashade R, FNP  Vitamin D , Ergocalciferol , (DRISDOL ) 1.25 MG (50000 UNIT) CAPS capsule Take 1 capsule (50,000 Units total) by mouth every 7 (seven) days. 12/20/23   Paseda, Folashade R, FNP      Allergies    Aspirin, Tape, and Tramadol     Review of Systems   Review of Systems  All other systems reviewed and are negative.   Physical Exam Updated Vital Signs BP 101/64   Pulse 90   Temp (!) 102.5 F (39.2 C) (Oral)   Resp 18   SpO2 92%  Physical Exam Vitals and nursing note reviewed.  Constitutional:      General: He is not in acute distress.    Appearance: Normal appearance. He is well-developed. He is not toxic-appearing.  HENT:     Head: Normocephalic and atraumatic.  Eyes:     General: Lids  are normal.     Conjunctiva/sclera: Conjunctivae normal.     Pupils: Pupils are equal, round, and reactive to light.  Neck:     Thyroid : No thyroid  mass.     Trachea: No tracheal deviation.  Cardiovascular:     Rate and Rhythm: Normal rate and regular rhythm.     Heart sounds: Normal heart sounds. No murmur heard.    No gallop.  Pulmonary:     Effort: Pulmonary effort is normal. No respiratory distress.     Breath sounds: Normal breath sounds. No stridor. No decreased breath sounds, wheezing, rhonchi or rales.  Abdominal:     General: There is no distension.     Palpations: Abdomen is soft.     Tenderness: There is no abdominal tenderness. There is no rebound.  Musculoskeletal:        General: No tenderness. Normal range of motion.     Cervical back: Normal range of motion and neck supple.  Skin:    General: Skin is warm and dry.     Findings: No abrasion or rash.  Neurological:     Mental Status: He is alert and oriented to person, place, and time. Mental status is at baseline.     GCS: GCS eye subscore is 4. GCS verbal subscore is  5. GCS motor subscore is 6.     Cranial Nerves: No cranial nerve deficit.     Sensory: No sensory deficit.     Motor: Motor function is intact.  Psychiatric:        Attention and Perception: Attention normal.        Speech: Speech normal.        Behavior: Behavior normal.     ED Results / Procedures / Treatments   Labs (all labs ordered are listed, but only abnormal results are displayed) Labs Reviewed  BASIC METABOLIC PANEL WITH GFR - Abnormal; Notable for the following components:      Result Value   Sodium 132 (*)    CO2 18 (*)    Glucose, Bld 104 (*)    BUN 26 (*)    Creatinine, Ser 1.62 (*)    GFR, Estimated 49 (*)    All other components within normal limits  CBC WITH DIFFERENTIAL/PLATELET - Abnormal; Notable for the following components:   WBC 13.9 (*)    RBC 1.69 (*)    Hemoglobin 5.6 (*)    HCT 15.4 (*)    MCHC 36.4 (*)     RDW 25.5 (*)    nRBC 5.0 (*)    Neutro Abs 10.6 (*)    Monocytes Absolute 1.3 (*)    Abs Immature Granulocytes 0.12 (*)    All other components within normal limits  CULTURE, BLOOD (ROUTINE X 2)  CULTURE, BLOOD (ROUTINE X 2)  RETICULOCYTES  HEPATIC FUNCTION PANEL  I-STAT CG4 LACTIC ACID, ED  PREPARE RBC (CROSSMATCH)  TYPE AND SCREEN    EKG None  Radiology No results found.  Procedures Procedures    Medications Ordered in ED Medications  0.9 %  sodium chloride  infusion (Manually program via Guardrails IV Fluids) (has no administration in time range)  cefTRIAXone  (ROCEPHIN ) 2 g in sodium chloride  0.9 % 100 mL IVPB (has no administration in time range)  acetaminophen  (TYLENOL ) tablet 650 mg (650 mg Oral Given 03/11/24 1929)    ED Course/ Medical Decision Making/ A&P                                 Medical Decision Making Amount and/or Complexity of Data Reviewed Labs: ordered. Radiology: ordered.  Risk OTC drugs. Prescription drug management.   Patient's fever treated with Tylenol .  Blood count of 13.9 with low hemoglobin.  Started on IV Rocephin  2 g for likely urinary source of his fever.  Blood transfusion also ordered due to low hemoglobin.  CT renal stone study performed to evaluate for possible obstructive stone which came out negative.  Suspect the patient has pyelonephritis and will admit to the hospitalist team  CRITICAL CARE Performed by: Eden Goodpasture Total critical care time: 45 minutes Critical care time was exclusive of separately billable procedures and treating other patients. Critical care was necessary to treat or prevent imminent or life-threatening deterioration. Critical care was time spent personally by me on the following activities: development of treatment plan with patient and/or surrogate as well as nursing, discussions with consultants, evaluation of patient's response to treatment, examination of patient, obtaining history from patient or  surrogate, ordering and performing treatments and interventions, ordering and review of laboratory studies, ordering and review of radiographic studies, pulse oximetry and re-evaluation of patient's condition.         Final Clinical Impression(s) / ED Diagnoses Final diagnoses:  None  Rx / DC Orders ED Discharge Orders     None         Lind Repine, MD 03/11/24 2202

## 2024-03-12 DIAGNOSIS — D5700 Hb-SS disease with crisis, unspecified: Secondary | ICD-10-CM | POA: Diagnosis present

## 2024-03-12 DIAGNOSIS — D631 Anemia in chronic kidney disease: Secondary | ICD-10-CM | POA: Diagnosis present

## 2024-03-12 DIAGNOSIS — G894 Chronic pain syndrome: Secondary | ICD-10-CM | POA: Diagnosis present

## 2024-03-12 DIAGNOSIS — I5032 Chronic diastolic (congestive) heart failure: Secondary | ICD-10-CM | POA: Diagnosis present

## 2024-03-12 DIAGNOSIS — D638 Anemia in other chronic diseases classified elsewhere: Secondary | ICD-10-CM | POA: Diagnosis present

## 2024-03-12 DIAGNOSIS — Z79899 Other long term (current) drug therapy: Secondary | ICD-10-CM | POA: Diagnosis not present

## 2024-03-12 DIAGNOSIS — N390 Urinary tract infection, site not specified: Secondary | ICD-10-CM | POA: Diagnosis present

## 2024-03-12 DIAGNOSIS — N12 Tubulo-interstitial nephritis, not specified as acute or chronic: Secondary | ICD-10-CM | POA: Diagnosis present

## 2024-03-12 DIAGNOSIS — N1831 Chronic kidney disease, stage 3a: Secondary | ICD-10-CM | POA: Diagnosis present

## 2024-03-12 DIAGNOSIS — Z832 Family history of diseases of the blood and blood-forming organs and certain disorders involving the immune mechanism: Secondary | ICD-10-CM | POA: Diagnosis not present

## 2024-03-12 DIAGNOSIS — A419 Sepsis, unspecified organism: Secondary | ICD-10-CM | POA: Diagnosis present

## 2024-03-12 DIAGNOSIS — Z1152 Encounter for screening for COVID-19: Secondary | ICD-10-CM | POA: Diagnosis not present

## 2024-03-12 DIAGNOSIS — Z8249 Family history of ischemic heart disease and other diseases of the circulatory system: Secondary | ICD-10-CM | POA: Diagnosis not present

## 2024-03-12 DIAGNOSIS — F17290 Nicotine dependence, other tobacco product, uncomplicated: Secondary | ICD-10-CM | POA: Diagnosis present

## 2024-03-12 DIAGNOSIS — Z5900 Homelessness unspecified: Secondary | ICD-10-CM | POA: Diagnosis not present

## 2024-03-12 DIAGNOSIS — Z888 Allergy status to other drugs, medicaments and biological substances status: Secondary | ICD-10-CM | POA: Diagnosis not present

## 2024-03-12 DIAGNOSIS — Z886 Allergy status to analgesic agent status: Secondary | ICD-10-CM | POA: Diagnosis not present

## 2024-03-12 LAB — RETICULOCYTES
Immature Retic Fract: 23.9 % — ABNORMAL HIGH (ref 2.3–15.9)
RBC.: 2.37 MIL/uL — ABNORMAL LOW (ref 4.22–5.81)
Retic Count, Absolute: 285.2 10*3/uL — ABNORMAL HIGH (ref 19.0–186.0)
Retic Ct Pct: 11.9 % — ABNORMAL HIGH (ref 0.4–3.1)

## 2024-03-12 LAB — COMPREHENSIVE METABOLIC PANEL WITH GFR
ALT: 23 U/L (ref 0–44)
AST: 40 U/L (ref 15–41)
Albumin: 3.3 g/dL — ABNORMAL LOW (ref 3.5–5.0)
Alkaline Phosphatase: 192 U/L — ABNORMAL HIGH (ref 38–126)
Anion gap: 7 (ref 5–15)
BUN: 25 mg/dL — ABNORMAL HIGH (ref 6–20)
CO2: 19 mmol/L — ABNORMAL LOW (ref 22–32)
Calcium: 8.6 mg/dL — ABNORMAL LOW (ref 8.9–10.3)
Chloride: 110 mmol/L (ref 98–111)
Creatinine, Ser: 1.82 mg/dL — ABNORMAL HIGH (ref 0.61–1.24)
GFR, Estimated: 43 mL/min — ABNORMAL LOW (ref >60)
Glucose, Bld: 95 mg/dL (ref 70–99)
Potassium: 5.1 mmol/L (ref 3.5–5.1)
Sodium: 136 mmol/L (ref 135–145)
Total Bilirubin: 4 mg/dL — ABNORMAL HIGH (ref 0.0–1.2)
Total Protein: 6.3 g/dL — ABNORMAL LOW (ref 6.5–8.1)

## 2024-03-12 LAB — CBC
HCT: 22.6 % — ABNORMAL LOW (ref 39.0–52.0)
Hemoglobin: 7.2 g/dL — ABNORMAL LOW (ref 13.0–17.0)
MCH: 30.3 pg (ref 26.0–34.0)
MCHC: 31.9 g/dL (ref 30.0–36.0)
MCV: 95 fL (ref 80.0–100.0)
Platelets: 151 10*3/uL (ref 150–400)
RBC: 2.38 MIL/uL — ABNORMAL LOW (ref 4.22–5.81)
RDW: 23.9 % — ABNORMAL HIGH (ref 11.5–15.5)
WBC: 12.3 10*3/uL — ABNORMAL HIGH (ref 4.0–10.5)
nRBC: 6.9 % — ABNORMAL HIGH (ref 0.0–0.2)

## 2024-03-12 LAB — HIV ANTIBODY (ROUTINE TESTING W REFLEX): HIV Screen 4th Generation wRfx: NONREACTIVE

## 2024-03-12 MED ORDER — CEPHALEXIN 500 MG PO CAPS
500.0000 mg | ORAL_CAPSULE | Freq: Four times a day (QID) | ORAL | Status: DC
  Administered 2024-03-12 – 2024-03-16 (×18): 500 mg via ORAL
  Filled 2024-03-12 (×19): qty 1

## 2024-03-12 MED ORDER — DIPHENHYDRAMINE HCL 25 MG PO CAPS
25.0000 mg | ORAL_CAPSULE | ORAL | Status: DC | PRN
  Administered 2024-03-12 – 2024-03-16 (×3): 25 mg via ORAL
  Filled 2024-03-12 (×3): qty 1

## 2024-03-12 MED ORDER — HYDROMORPHONE 1 MG/ML IV SOLN
INTRAVENOUS | Status: DC
  Administered 2024-03-12 (×2): 2 mg via INTRAVENOUS
  Administered 2024-03-12: 30 mg via INTRAVENOUS
  Administered 2024-03-12: 0.1 mg via INTRAVENOUS
  Administered 2024-03-13: 2 mg via INTRAVENOUS
  Administered 2024-03-13: 1 mg via INTRAVENOUS
  Administered 2024-03-13: 0.1 mg via INTRAVENOUS
  Administered 2024-03-13: 4 mg via INTRAVENOUS
  Administered 2024-03-14: 0.5 mg via INTRAVENOUS
  Administered 2024-03-14 (×2): 3 mg via INTRAVENOUS
  Administered 2024-03-14: 0.5 mg via INTRAVENOUS
  Administered 2024-03-14: 2 mg via INTRAVENOUS
  Administered 2024-03-14: 0.5 mg via INTRAVENOUS
  Administered 2024-03-15: 2.5 mg via INTRAVENOUS
  Administered 2024-03-15: 0.1 mg via INTRAVENOUS
  Administered 2024-03-15: 2 mg via INTRAVENOUS
  Administered 2024-03-15: 0.5 mg via INTRAVENOUS
  Administered 2024-03-15: 3.5 mg via INTRAVENOUS
  Administered 2024-03-15: 2.5 mg via INTRAVENOUS
  Administered 2024-03-15: 0.5 mg via INTRAVENOUS
  Administered 2024-03-16: 1.5 mg via INTRAVENOUS
  Administered 2024-03-16: 2.5 mg via INTRAVENOUS
  Administered 2024-03-16: 1 mg via INTRAVENOUS
  Administered 2024-03-16: 0.5 mg via INTRAVENOUS
  Administered 2024-03-16: 2 mg via INTRAVENOUS
  Administered 2024-03-16: 1 mg via INTRAVENOUS
  Administered 2024-03-17: 2 mg via INTRAVENOUS
  Administered 2024-03-17: 1.5 mg via INTRAVENOUS
  Administered 2024-03-17: 1 mg via INTRAVENOUS
  Filled 2024-03-12 (×2): qty 30

## 2024-03-12 MED ORDER — NALOXONE HCL 0.4 MG/ML IJ SOLN: 0.4000 mg | INTRAMUSCULAR | Status: DC | PRN

## 2024-03-12 MED ORDER — SODIUM CHLORIDE 0.9% FLUSH: 9.0000 mL | INTRAVENOUS | Status: DC | PRN

## 2024-03-12 MED ORDER — ORAL CARE MOUTH RINSE: 15.0000 mL | OROMUCOSAL | Status: DC | PRN

## 2024-03-12 MED ORDER — SODIUM CHLORIDE 0.45 % IV SOLN: INTRAVENOUS | Status: AC

## 2024-03-12 NOTE — Plan of Care (Signed)
  Problem: Fluid Volume: Goal: Hemodynamic stability will improve Outcome: Progressing   Problem: Clinical Measurements: Goal: Diagnostic test results will improve Outcome: Progressing Goal: Signs and symptoms of infection will decrease Outcome: Progressing   Problem: Respiratory: Goal: Ability to maintain adequate ventilation will improve Outcome: Progressing   Problem: Education: Goal: Knowledge of General Education information will improve Description: Including pain rating scale, medication(s)/side effects and non-pharmacologic comfort measures Outcome: Progressing   Problem: Health Behavior/Discharge Planning: Goal: Ability to manage health-related needs will improve Outcome: Progressing   Problem: Clinical Measurements: Goal: Ability to maintain clinical measurements within normal limits will improve Outcome: Progressing Goal: Will remain free from infection Outcome: Progressing Goal: Diagnostic test results will improve Outcome: Progressing Goal: Respiratory complications will improve Outcome: Progressing Goal: Cardiovascular complication will be avoided Outcome: Progressing

## 2024-03-12 NOTE — Progress Notes (Signed)
   03/12/24 2240  Assess: MEWS Score  Temp (!) 102.9 F (39.4 C)  BP 131/64  MAP (mmHg) 83  Pulse Rate 95  Resp 16  SpO2 91 %  O2 Device Nasal Cannula  Assess: MEWS Score  MEWS Temp 2  MEWS Systolic 0  MEWS Pulse 0  MEWS RR 0  MEWS LOC 0  MEWS Score 2  MEWS Score Color Yellow  Assess: if the MEWS score is Yellow or Red  Were vital signs accurate and taken at a resting state? Yes  Does the patient meet 2 or more of the SIRS criteria? No  MEWS guidelines implemented  Yes, yellow  Treat  MEWS Interventions Considered administering scheduled or prn medications/treatments as ordered  Take Vital Signs  Increase Vital Sign Frequency  Yellow: Q2hr x1, continue Q4hrs until patient remains green for 12hrs  Escalate  MEWS: Escalate Yellow: Discuss with charge nurse and consider notifying provider and/or RRT  Notify: Charge Nurse/RN  Name of Charge Nurse/RN Notified Jhonnie Mosher, RN  Assess: SIRS CRITERIA  SIRS Temperature  1  SIRS Respirations  0  SIRS Pulse 1  SIRS WBC 0  SIRS Score Sum  2   Patient in yellow mew due to elevated temp. Charge nurse notified and aware. Hospitalist informed as well. Administering Tylenol  to patient.

## 2024-03-12 NOTE — Progress Notes (Addendum)
 Patient ID: ARTH NICASTRO, male   DOB: Apr 26, 1966, 58 y.o.   MRN: [id number] Subjective: Scott Norton is a 58 year old male with a medical history significant for sickle cell disease, diastolic CHF, history of cocaine abuse, depression with anxiety, and anemia of chronic disease who presented to the ER with UTI, chills and left flank pain.   Patient continues to report left flank pain and generalized body pain today at 8/10. Objective:  Vital signs in last 24 hours:  Vitals:   03/12/24 1231 03/12/24 1405 03/12/24 1434 03/12/24 1622  BP: 123/85 123/65 122/71   Pulse: 71 (!) 56 (!) 54   Resp: 20 18 16 16   Temp: 99.1 F (37.3 C) 98.4 F (36.9 C) 98.7 F (37.1 C)   TempSrc:  Oral Oral   SpO2: 96% 92% 97% 97%  Weight:      Height:        Intake/Output from previous day:   Intake/Output Summary (Last 24 hours) at 03/12/2024 1727 Last data filed at 03/12/2024 1600 Gross per 24 hour  Intake 3925.59 ml  Output --  Net 3925.59 ml    Physical Exam: General: Alert, awake, oriented x3, in no acute distress.  HEENT: [place]/AT PEER[place], EOMI Neck: Trachea midline,  no masses, no thyromegal,y no JVD, no carotid bruit OROPHARYNX:  Moist, No exudate/ erythema/lesions.  Heart: Regular rate and rhythm, without murmurs, rubs, gallops, PMI non-displaced, no heaves or thrills on palpation.  Lungs: Clear to auscultation, no wheezing or rhonchi noted. No increased vocal fremitus resonant to percussion  Abdomen: Soft, nontender, nondistended, positive bowel sounds, no masses no hepatosplenomegaly noted..  Neuro: No focal neurological deficits noted cranial nerves II through XII grossly intact. DTRs 2+ bilaterally upper and lower extremities. Strength 5 out of 5 in bilateral upper and lower extremities. Musculoskeletal: Generalized pain, left flank pain.   Psychiatric: Patient alert and oriented x3, good insight and cognition, good recent to remote recall. Lymph node survey: No cervical axillary or inguinal  lymphadenopathy noted.  Lab Results:  Basic Metabolic Panel:    Component Value Date/Time   NA 136 03/12/2024 0736   NA 140 12/18/2023 0849   K 5.1 03/12/2024 0736   CL 110 03/12/2024 0736   CO2 19 (L) 03/12/2024 0736   BUN 25 (H) 03/12/2024 0736   BUN 13 12/18/2023 0849   CREATININE 1.82 (H) 03/12/2024 0736   CREATININE 0.90 07/19/2014 1128   GLUCOSE 95 03/12/2024 0736   CALCIUM  8.6 (L) 03/12/2024 0736   CBC:    Component Value Date/Time   WBC 12.3 (H) 03/12/2024 0736   HGB 7.2 (L) 03/12/2024 0736   HGB 7.3 (L) 12/18/2023 0849   HCT 22.6 (L) 03/12/2024 0736   HCT 22.2 (L) 12/18/2023 0849   PLT 151 03/12/2024 0736   PLT 177 12/18/2023 0849   MCV 95.0 03/12/2024 0736   MCV 96 12/18/2023 0849   NEUTROABS 10.6 (H) 03/11/2024 1931   NEUTROABS 3.6 12/18/2023 0849   LYMPHSABS 1.9 03/11/2024 1931   LYMPHSABS 3.7 (H) 12/18/2023 0849   MONOABS 1.3 (H) 03/11/2024 1931   EOSABS 0.0 03/11/2024 1931   EOSABS 0.2 12/18/2023 0849   BASOSABS 0.0 03/11/2024 1931   BASOSABS 0.0 12/18/2023 0849    Recent Results (from the past 240 hours)  Culture, blood (Routine X 2) w Reflex to ID Panel     Status: None (Preliminary result)   Collection Time: 03/11/24  9:20 PM   Specimen: BLOOD  Result Value Ref Range Status  Specimen Description   Final    BLOOD LEFT ANTECUBITAL Performed at [facility], [address], [place], [place] [address]    Special Requests   Final    BOTTLES DRAWN AEROBIC AND ANAEROBIC Blood Culture results may not be optimal due to an inadequate volume of blood received in culture bottles Performed at [facility], [address], [place], [place] [address]    Culture   Final    NO GROWTH < 12 HOURS Performed at [facility], [address] [place], [place] [address]    Report Status PENDING  Incomplete  Culture, blood (Routine X 2) w Reflex to ID Panel     Status: None (Preliminary result)   Collection Time:  03/11/24  9:25 PM   Specimen: BLOOD  Result Value Ref Range Status   Specimen Description   Final    BLOOD RIGHT ANTECUBITAL Performed at [facility], [address], [place], [place] [address]    Special Requests   Final    BOTTLES DRAWN AEROBIC AND ANAEROBIC Blood Culture results may not be optimal due to an inadequate volume of blood received in culture bottles Performed at [facility], [address], [place], [place] [address]    Culture   Final    NO GROWTH < 12 HOURS Performed at [facility], [address], [place], [place] [address]    Report Status PENDING  Incomplete  Resp panel by RT-PCR (RSV, Flu A&B, Covid) Anterior Nasal Swab     Status: None   Collection Time: 03/11/24 11:14 PM   Specimen: Anterior Nasal Swab  Result Value Ref Range Status   SARS Coronavirus 2 by RT PCR NEGATIVE NEGATIVE Final    Comment: (NOTE) SARS-CoV-2 target nucleic acids are NOT DETECTED.  The SARS-CoV-2 RNA is generally detectable in upper respiratory specimens during the acute phase of infection. The lowest concentration of SARS-CoV-2 viral copies this assay can detect is 138 copies/mL. A negative result does not preclude SARS-Cov-2 infection and should not be used as the sole basis for treatment or other patient management decisions. A negative result may occur with  improper specimen collection/handling, submission of specimen other than nasopharyngeal swab, presence of viral mutation(s) within the areas targeted by this assay, and inadequate number of viral copies(<138 copies/mL). A negative result must be combined with clinical observations, patient history, and epidemiological information. The expected result is Negative.  Fact Sheet for Patients:  BloggerCourse.com  Fact Sheet for Healthcare Providers:  SeriousBroker.it  This test is no t yet approved or cleared by the [place]   FDA and  has been authorized for detection and/or diagnosis of SARS-CoV-2 by FDA under an Emergency Use Authorization (EUA). This EUA will remain  in effect (meaning this test can be used) for the duration of the COVID-19 declaration under Section 564(b)(1) of the Act, 21 U.S.C.section 360bbb-3(b)(1), unless the authorization is terminated  or revoked sooner.       Influenza A by PCR NEGATIVE NEGATIVE Final   Influenza B by PCR NEGATIVE NEGATIVE Final    Comment: (NOTE) The Xpert Xpress SARS-CoV-2/FLU/RSV plus assay is intended as an aid in the diagnosis of influenza from Nasopharyngeal swab specimens and should not be used as a sole basis for treatment. Nasal washings and aspirates are unacceptable for Xpert Xpress SARS-CoV-2/FLU/RSV testing.  Fact Sheet for Patients: BloggerCourse.com  Fact Sheet for Healthcare Providers: SeriousBroker.it  This test is not yet approved  or cleared by the [place]  FDA and has been authorized for detection and/or diagnosis of SARS-CoV-2 by FDA under an Emergency Use Authorization (EUA). This EUA will remain in effect (meaning this test can be used) for the duration of the COVID-19 declaration under Section 564(b)(1) of the Act, 21 U.S.C. section 360bbb-3(b)(1), unless the authorization is terminated or revoked.     Resp Syncytial Virus by PCR NEGATIVE NEGATIVE Final    Comment: (NOTE) Fact Sheet for Patients: BloggerCourse.com  Fact Sheet for Healthcare Providers: SeriousBroker.it  This test is not yet approved or cleared by the [place]  FDA and has been authorized for detection and/or diagnosis of SARS-CoV-2 by FDA under an Emergency Use Authorization (EUA). This EUA will remain in effect (meaning this test can be used) for the duration of the COVID-19 declaration under Section 564(b)(1) of the Act, 21 U.S.C. section  360bbb-3(b)(1), unless the authorization is terminated or revoked.  Performed at [facility], [address], [place], [place] [address]     Studies/Results: CT Renal Stone Study Result Date: 03/11/2024 CLINICAL DATA:  Abdomen flank pain EXAM: CT ABDOMEN AND PELVIS WITHOUT CONTRAST TECHNIQUE: Multidetector CT imaging of the abdomen and pelvis was performed following the standard protocol without IV contrast. RADIATION DOSE REDUCTION: This exam was performed according to the departmental dose-optimization program which includes automated exposure control, adjustment of the mA and/or kV according to patient size and/or use of iterative reconstruction technique. COMPARISON:  Chest CT 06/10/2021 FINDINGS: Lower chest: Lung bases demonstrate no acute airspace disease. Differential blood cardiac pool suggesting anemia. Cardiomegaly. Trace pericardial effusion. Probable left ventricular papillary muscle calcifications, no change. Hepatobiliary: Enlarged appearing left hepatic lobe. Question surface nodularity of liver. Lobulated liver contour mass at the posteroinferior right hepatic lobe, series 2, image 29 and coronal series 5 image 84 Pancreas: Unremarkable. No pancreatic ductal dilatation or surrounding inflammatory changes. Spleen: Splenic granuloma Adrenals/Urinary Tract: Adrenal glands are within normal limits. Kidneys show no hydronephrosis. Lobulated contour of the right greater than left kidney potentially due to cortical scarring. The urinary bladder is nearly empty Stomach/Bowel: Stomach nonenlarged. No dilated small bowel. No acute bowel wall thickening. Vascular/Lymphatic: Aortic atherosclerosis. No enlarged abdominal or pelvic lymph nodes. Reproductive: Prostate is unremarkable. Other: Negative for pelvic effusion or free air. Musculoskeletal: No acute or suspicious osseous abnormality. IMPRESSION: 1. Negative for hydronephrosis or ureteral stone. Lobulated contour of the right  greater than left kidney potentially due to cortical scarring. 2. Enlarged appearing left hepatic lobe. Question surface nodularity of liver, correlate for cirrhosis risk factors. Focal lobulated liver contour versus a mass at the posteroinferior right hepatic lobe, When the patient is clinically stable and able to follow directions and hold their breath (preferably as an outpatient) further evaluation with dedicated abdominal MRI should be considered. 3. Cardiomegaly. Trace pericardial effusion. 4. Aortic atherosclerosis. Aortic Atherosclerosis (ICD10-I70.0). Electronically Signed   By: Esmeralda Hedge M.D.   On: 03/11/2024 21:18    Medications: Scheduled Meds:  sodium chloride    Intravenous Once   cephALEXin   500 mg Oral QID   enoxaparin  (LOVENOX ) injection  40 mg Subcutaneous Q24H   HYDROmorphone    Intravenous Q4H   sodium chloride  flush  3 mL Intravenous Q12H   Continuous Infusions:  sodium chloride  75 mL/hr at 03/12/24 0938   PRN Meds:.acetaminophen  **OR** [DISCONTINUED] acetaminophen , diphenhydrAMINE , naloxone  **AND** sodium chloride  flush, ondansetron  **OR** ondansetron  (ZOFRAN ) IV, mouth rinse, oxyCODONE -acetaminophen  **AND** oxyCODONE , senna-docusate  Consultants: None  Procedures: None  Antibiotics: Antibiotics  Assessment/Plan:  Principal Problem:   Sepsis due to urinary tract infection (HCC) Active Problems:   Sickle cell anemia (HCC)   Chronic kidney disease, stage 3a (HCC)   Abnormal liver CT   Hb Sickle Cell Disease with Pain crisis: Continue IVF 0.45% Saline @ 125 mls/hour, continue weight based Dilaudid  PCA, IV Toradol  15 mg Q 6 H for a total of 5 days, continue oral home pain medications as ordered. Monitor vitals very closely, Re-evaluate pain scale regularly, 2 L of Oxygen by [place]. Patient encouraged to ambulate on the hallway today.  Leukocytosis: Slightly elevated. Currently on antibiotics for UTI.  Will continue to monitor.  Daily CBC in place. Anemia of Chronic  Disease: Hemoglobin at 7.2 g/dL.  No need for transfusion.  Will continue to monitor. Chronic pain Syndrome: Continue oral home pain medication Chronic kidney disease, stage III: Continue treatment plan prior to admission.   Code Status: Full Code Family Communication: N/A Disposition Plan: Not yet ready for discharge  Lorel Roes NP  If 7PM-7AM, please contact night-coverage.  03/12/2024, 5:27 PM  LOS: 0 days

## 2024-03-13 ENCOUNTER — Inpatient Hospital Stay (HOSPITAL_COMMUNITY)

## 2024-03-13 LAB — BPAM RBC
Blood Product Expiration Date: 202506262359
Blood Product Expiration Date: 202506262359
ISSUE DATE / TIME: 202505220023
ISSUE DATE / TIME: 202505220230
Unit Type and Rh: 9500
Unit Type and Rh: 9500

## 2024-03-13 LAB — TYPE AND SCREEN
ABO/RH(D): O POS
Antibody Screen: NEGATIVE
Donor AG Type: NEGATIVE
Donor AG Type: NEGATIVE
Unit division: 0
Unit division: 0

## 2024-03-13 LAB — URINE CULTURE: Culture: NO GROWTH

## 2024-03-13 MED ORDER — GUAIFENESIN-DM 100-10 MG/5ML PO SYRP
5.0000 mL | ORAL_SOLUTION | ORAL | Status: DC | PRN
  Administered 2024-03-13 – 2024-03-16 (×3): 5 mL via ORAL
  Filled 2024-03-13 (×3): qty 5

## 2024-03-13 MED ORDER — CAMPHOR-MENTHOL 0.5-0.5 % EX LOTN
TOPICAL_LOTION | CUTANEOUS | Status: DC | PRN
  Administered 2024-03-13: 1 via TOPICAL
  Filled 2024-03-13: qty 222

## 2024-03-13 MED ORDER — HYDROXYZINE HCL 25 MG PO TABS
25.0000 mg | ORAL_TABLET | Freq: Once | ORAL | Status: AC
  Administered 2024-03-13: 25 mg via ORAL
  Filled 2024-03-13: qty 1

## 2024-03-13 MED ORDER — IPRATROPIUM-ALBUTEROL 0.5-2.5 (3) MG/3ML IN SOLN
3.0000 mL | Freq: Once | RESPIRATORY_TRACT | Status: AC
  Administered 2024-03-13: 3 mL via RESPIRATORY_TRACT
  Filled 2024-03-13: qty 3

## 2024-03-13 MED ORDER — SODIUM CHLORIDE 0.45 % IV SOLN: INTRAVENOUS | Status: AC

## 2024-03-13 NOTE — Plan of Care (Signed)
  Problem: Fluid Volume: Goal: Hemodynamic stability will improve Outcome: Progressing   Problem: Clinical Measurements: Goal: Diagnostic test results will improve Outcome: Progressing Goal: Signs and symptoms of infection will decrease Outcome: Progressing   Problem: Respiratory: Goal: Ability to maintain adequate ventilation will improve Outcome: Progressing   Problem: Education: Goal: Knowledge of General Education information will improve Description: Including pain rating scale, medication(s)/side effects and non-pharmacologic comfort measures Outcome: Progressing   Problem: Health Behavior/Discharge Planning: Goal: Ability to manage health-related needs will improve Outcome: Progressing   Problem: Clinical Measurements: Goal: Ability to maintain clinical measurements within normal limits will improve Outcome: Progressing Goal: Will remain free from infection Outcome: Progressing Goal: Diagnostic test results will improve Outcome: Progressing Goal: Respiratory complications will improve Outcome: Progressing Goal: Cardiovascular complication will be avoided Outcome: Progressing   Problem: Activity: Goal: Risk for activity intolerance will decrease Outcome: Progressing   Problem: Nutrition: Goal: Adequate nutrition will be maintained Outcome: Progressing   Problem: Coping: Goal: Level of anxiety will decrease Outcome: Progressing   Problem: Elimination: Goal: Will not experience complications related to bowel motility Outcome: Progressing Goal: Will not experience complications related to urinary retention Outcome: Progressing   Problem: Pain Managment: Goal: General experience of comfort will improve and/or be controlled Outcome: Progressing   Problem: Safety: Goal: Ability to remain free from injury will improve Outcome: Progressing   Problem: Skin Integrity: Goal: Risk for impaired skin integrity will decrease Outcome: Progressing   Problem:  Education: Goal: Knowledge of vaso-occlusive preventative measures will improve Outcome: Progressing Goal: Awareness of infection prevention will improve Outcome: Progressing Goal: Awareness of signs and symptoms of anemia will improve Outcome: Progressing Goal: Long-term complications will improve Outcome: Progressing   Problem: Self-Care: Goal: Ability to incorporate actions that prevent/reduce pain crisis will improve Outcome: Progressing   Problem: Bowel/Gastric: Goal: Gut motility will be maintained Outcome: Progressing   Problem: Tissue Perfusion: Goal: Complications related to inadequate tissue perfusion will be avoided or minimized Outcome: Progressing   Problem: Respiratory: Goal: Pulmonary complications will be avoided or minimized Outcome: Progressing Goal: Acute Chest Syndrome will be identified early to prevent complications Outcome: Progressing   Problem: Fluid Volume: Goal: Ability to maintain a balanced intake and output will improve Outcome: Progressing   Problem: Sensory: Goal: Pain level will decrease with appropriate interventions Outcome: Progressing   Problem: Health Behavior: Goal: Postive changes in compliance with treatment and prescription regimens will improve Outcome: Progressing   Problem: Education: Goal: Knowledge of vaso-occlusive preventative measures will improve Outcome: Progressing Goal: Awareness of infection prevention will improve Outcome: Progressing Goal: Awareness of signs and symptoms of anemia will improve Outcome: Progressing Goal: Long-term complications will improve Outcome: Progressing   Problem: Self-Care: Goal: Ability to incorporate actions that prevent/reduce pain crisis will improve Outcome: Progressing   Problem: Bowel/Gastric: Goal: Gut motility will be maintained Outcome: Progressing   Problem: Tissue Perfusion: Goal: Complications related to inadequate tissue perfusion will be avoided or  minimized Outcome: Progressing   Problem: Respiratory: Goal: Pulmonary complications will be avoided or minimized Outcome: Progressing Goal: Acute Chest Syndrome will be identified early to prevent complications Outcome: Progressing   Problem: Fluid Volume: Goal: Ability to maintain a balanced intake and output will improve Outcome: Progressing   Problem: Sensory: Goal: Pain level will decrease with appropriate interventions Outcome: Progressing   Problem: Health Behavior: Goal: Postive changes in compliance with treatment and prescription regimens will improve Outcome: Progressing

## 2024-03-13 NOTE — Progress Notes (Signed)
 Patient ID: Scott Norton, male   DOB: [birth year], 58 y.o.   MRN: [id number] Subjective: Scott Norton is a 58 year old male with a medical history significant for sickle cell disease, diastolic CHF, history of cocaine abuse, depression with anxiety, and anemia of chronic disease who presented to the ER with UTI, chills and left flank pain.   Patient continues to report left flank pain and generalized body pain today at 6.5/10. Objective:  Vital signs in last 24 hours:  Vitals:   03/13/24 0726 03/13/24 0811 03/13/24 1139 03/13/24 1202  BP:  95/61    Pulse:  79    Resp: 16 14  16   Temp:  98.5 F (36.9 C)    TempSrc:  Oral    SpO2:  94% 91%   Weight:      Height:        Intake/Output from previous day:   Intake/Output Summary (Last 24 hours) at 03/13/2024 1256 Last data filed at 03/13/2024 1024 Gross per 24 hour  Intake 1317.72 ml  Output 1125 ml  Net 192.72 ml    Physical Exam: General: Alert, awake, oriented x3, in no acute distress.  HEENT: [place]/AT PEERL, EOMI Neck: Trachea midline,  no masses, no thyromegal,y no JVD, no carotid bruit OROPHARYNX:  Moist, No exudate/ erythema/lesions.  Heart: Regular rate and rhythm, without murmurs, rubs, gallops, PMI non-displaced, no heaves or thrills on palpation.  Lungs: Clear to auscultation, no wheezing or rhonchi noted. No increased vocal fremitus resonant to percussion  Abdomen: Soft, nontender, nondistended, positive bowel sounds, no masses no hepatosplenomegaly noted..  Neuro: No focal neurological deficits noted cranial nerves II through XII grossly intact. DTRs 2+ bilaterally upper and lower extremities. Strength 5 out of 5 in bilateral upper and lower extremities. Musculoskeletal: Generalized pain, left flank pain.   Psychiatric: Patient alert and oriented x3, good insight and cognition, good recent to remote recall. Lymph node survey: No cervical axillary or inguinal lymphadenopathy noted.  Lab Results:  Basic Metabolic  Panel:    Component Value Date/Time   NA 136 03/12/2024 0736   NA 140 12/18/2023 0849   K 5.1 03/12/2024 0736   CL 110 03/12/2024 0736   CO2 19 (L) 03/12/2024 0736   BUN 25 (H) 03/12/2024 0736   BUN 13 12/18/2023 0849   CREATININE 1.82 (H) 03/12/2024 0736   CREATININE 0.90 07/19/2014 1128   GLUCOSE 95 03/12/2024 0736   CALCIUM  8.6 (L) 03/12/2024 0736   CBC:    Component Value Date/Time   WBC 12.3 (H) 03/12/2024 0736   HGB 7.2 (L) 03/12/2024 0736   HGB 7.3 (L) 12/18/2023 0849   HCT 22.6 (L) 03/12/2024 0736   HCT 22.2 (L) 12/18/2023 0849   PLT 151 03/12/2024 0736   PLT 177 12/18/2023 0849   MCV 95.0 03/12/2024 0736   MCV 96 12/18/2023 0849   NEUTROABS 10.6 (H) 03/11/2024 1931   NEUTROABS 3.6 12/18/2023 0849   LYMPHSABS 1.9 03/11/2024 1931   LYMPHSABS 3.7 (H) 12/18/2023 0849   MONOABS 1.3 (H) 03/11/2024 1931   EOSABS 0.0 03/11/2024 1931   EOSABS 0.2 12/18/2023 0849   BASOSABS 0.0 03/11/2024 1931   BASOSABS 0.0 12/18/2023 0849    Recent Results (from the past 240 hours)  Culture, blood (Routine X 2) w Reflex to ID Panel     Status: None (Preliminary result)   Collection Time: 03/11/24  9:20 PM   Specimen: BLOOD  Result Value Ref Range Status   Specimen Description   Final  BLOOD LEFT ANTECUBITAL Performed at [facility], [address], [place], [place] [address]    Special Requests   Final    BOTTLES DRAWN AEROBIC AND ANAEROBIC Blood Culture results may not be optimal due to an inadequate volume of blood received in culture bottles Performed at [facility], [address] [place], [place] [address]    Culture   Final    NO GROWTH 2 DAYS Performed at [facility], [address], [place], [place] [address]    Report Status PENDING  Incomplete  Culture, blood (Routine X 2) w Reflex to ID Panel     Status: None (Preliminary result)   Collection Time: 03/11/24  9:25 PM   Specimen: BLOOD  Result Value Ref Range Status    Specimen Description   Final    BLOOD RIGHT ANTECUBITAL Performed at [facility], [address], [place], [place] [address]    Special Requests   Final    BOTTLES DRAWN AEROBIC AND ANAEROBIC Blood Culture results may not be optimal due to an inadequate volume of blood received in culture bottles Performed at [facility], [address] [place], [place] [address]    Culture   Final    NO GROWTH 2 DAYS Performed at [facility], [address] [place], [place] [address]    Report Status PENDING  Incomplete  Urine Culture (for pregnant, neutropenic or urologic patients or patients with an indwelling urinary catheter)     Status: None   Collection Time: 03/11/24 11:10 PM   Specimen: Urine, Clean Catch  Result Value Ref Range Status   Specimen Description   Final    URINE, CLEAN CATCH Performed at [facility], [address], [place], [place] [address]    Special Requests   Final    NONE Performed at [facility], [address], [place], [place] [address]    Culture   Final    NO GROWTH Performed at [facility], [address], [place], [place] [address]    Report Status 03/13/2024 FINAL  Final  Resp panel by RT-PCR (RSV, Flu A&B, Covid) Anterior Nasal Swab     Status: None   Collection Time: 03/11/24 11:14 PM   Specimen: Anterior Nasal Swab  Result Value Ref Range Status   SARS Coronavirus 2 by RT PCR NEGATIVE NEGATIVE Final    Comment: (NOTE) SARS-CoV-2 target nucleic acids are NOT DETECTED.  The SARS-CoV-2 RNA is generally detectable in upper respiratory specimens during the acute phase of infection. The lowest concentration of SARS-CoV-2 viral copies this assay can detect is 138 copies/mL. A negative result does not preclude SARS-Cov-2 infection and should not be used as the sole basis for treatment or other patient management decisions. A negative result may occur with   improper specimen collection/handling, submission of specimen other than nasopharyngeal swab, presence of viral mutation(s) within the areas targeted by this assay, and inadequate number of viral copies(<138 copies/mL). A negative result must be combined with clinical observations, patient history, and epidemiological information. The expected result is Negative.  Fact Sheet for Patients:  BloggerCourse.com  Fact Sheet for Healthcare Providers:  SeriousBroker.it  This test is no t yet approved or cleared by the [place]  FDA and  has been authorized for detection and/or diagnosis of SARS-CoV-2 by FDA under an Emergency Use Authorization (EUA). This EUA will remain  in effect (meaning this test can  be used) for the duration of the COVID-19 declaration under Section 564(b)(1) of the Act, 21 U.S.C.section 360bbb-3(b)(1), unless the authorization is terminated  or revoked sooner.       Influenza A by PCR NEGATIVE NEGATIVE Final   Influenza B by PCR NEGATIVE NEGATIVE Final    Comment: (NOTE) The Xpert Xpress SARS-CoV-2/FLU/RSV plus assay is intended as an aid in the diagnosis of influenza from Nasopharyngeal swab specimens and should not be used as a sole basis for treatment. Nasal washings and aspirates are unacceptable for Xpert Xpress SARS-CoV-2/FLU/RSV testing.  Fact Sheet for Patients: BloggerCourse.com  Fact Sheet for Healthcare Providers: SeriousBroker.it  This test is not yet approved or cleared by the [place]  FDA and has been authorized for detection and/or diagnosis of SARS-CoV-2 by FDA under an Emergency Use Authorization (EUA). This EUA will remain in effect (meaning this test can be used) for the duration of the COVID-19 declaration under Section 564(b)(1) of the Act, 21 U.S.C. section 360bbb-3(b)(1), unless the authorization is terminated or revoked.      Resp Syncytial Virus by PCR NEGATIVE NEGATIVE Final    Comment: (NOTE) Fact Sheet for Patients: BloggerCourse.com  Fact Sheet for Healthcare Providers: SeriousBroker.it  This test is not yet approved or cleared by the [place]  FDA and has been authorized for detection and/or diagnosis of SARS-CoV-2 by FDA under an Emergency Use Authorization (EUA). This EUA will remain in effect (meaning this test can be used) for the duration of the COVID-19 declaration under Section 564(b)(1) of the Act, 21 U.S.C. section 360bbb-3(b)(1), unless the authorization is terminated or revoked.  Performed at [facility], [address] [place], [place] [address]     Studies/Results: CT Renal Stone Study Result Date: 03/11/2024 CLINICAL DATA:  Abdomen flank pain EXAM: CT ABDOMEN AND PELVIS WITHOUT CONTRAST TECHNIQUE: Multidetector CT imaging of the abdomen and pelvis was performed following the standard protocol without IV contrast. RADIATION DOSE REDUCTION: This exam was performed according to the departmental dose-optimization program which includes automated exposure control, adjustment of the mA and/or kV according to patient size and/or use of iterative reconstruction technique. COMPARISON:  Chest CT 06/10/2021 FINDINGS: Lower chest: Lung bases demonstrate no acute airspace disease. Differential blood cardiac pool suggesting anemia. Cardiomegaly. Trace pericardial effusion. Probable left ventricular papillary muscle calcifications, no change. Hepatobiliary: Enlarged appearing left hepatic lobe. Question surface nodularity of liver. Lobulated liver contour mass at the posteroinferior right hepatic lobe, series 2, image 29 and coronal series 5 image 84 Pancreas: Unremarkable. No pancreatic ductal dilatation or surrounding inflammatory changes. Spleen: Splenic granuloma Adrenals/Urinary Tract: Adrenal glands are within normal limits.  Kidneys show no hydronephrosis. Lobulated contour of the right greater than left kidney potentially due to cortical scarring. The urinary bladder is nearly empty Stomach/Bowel: Stomach nonenlarged. No dilated small bowel. No acute bowel wall thickening. Vascular/Lymphatic: Aortic atherosclerosis. No enlarged abdominal or pelvic lymph nodes. Reproductive: Prostate is unremarkable. Other: Negative for pelvic effusion or free air. Musculoskeletal: No acute or suspicious osseous abnormality. IMPRESSION: 1. Negative for hydronephrosis or ureteral stone. Lobulated contour of the right greater than left kidney potentially due to cortical scarring. 2. Enlarged appearing left hepatic lobe. Question surface nodularity of liver, correlate for cirrhosis risk factors. Focal lobulated liver contour versus a mass at the posteroinferior right hepatic lobe, When the patient is clinically stable and able to follow directions and hold their breath (preferably as an outpatient) further evaluation with dedicated abdominal MRI should be considered. 3. Cardiomegaly. Trace pericardial  effusion. 4. Aortic atherosclerosis. Aortic Atherosclerosis (ICD10-I70.0). Electronically Signed   By: Esmeralda Hedge M.D.   On: 03/11/2024 21:18    Medications: Scheduled Meds:  cephALEXin   500 mg Oral QID   enoxaparin  (LOVENOX ) injection  40 mg Subcutaneous Q24H   HYDROmorphone    Intravenous Q4H   sodium chloride  flush  3 mL Intravenous Q12H   Continuous Infusions:  sodium chloride  75 mL/hr at 03/13/24 1103   PRN Meds:.acetaminophen  **OR** [DISCONTINUED] acetaminophen , camphor-menthol , diphenhydrAMINE , guaiFENesin -dextromethorphan , naloxone  **AND** sodium chloride  flush, ondansetron  **OR** ondansetron  (ZOFRAN ) IV, mouth rinse, oxyCODONE -acetaminophen  **AND** oxyCODONE , senna-docusate  Consultants: None  Procedures: None  Antibiotics: Antibiotics  Assessment/Plan: Principal Problem:   Sepsis due to urinary tract infection  (HCC) Active Problems:   Sickle cell anemia (HCC)   Chronic kidney disease, stage 3a (HCC)   Abnormal liver CT   Hb Sickle Cell Disease with Pain crisis: Continue IVF 0.45% Saline @ 75 mls/hour, continue weight based Dilaudid  PCA, continue oral home pain medications as ordered. Monitor vitals very closely, Re-evaluate pain scale regularly, 2 L of Oxygen by [place]. Patient encouraged to ambulate on the hallway today.  Leukocytosis: Slightly elevated. Currently on antibiotics for UTI.  Will continue to monitor.  Daily CBC in place. Anemia of Chronic Disease: Hemoglobin at 7.2 g/dL.  No need for transfusion.  Will continue to monitor. Chronic pain Syndrome: Continue oral home pain medication Chronic kidney disease, stage III: Continue treatment plan prior to admission. Remove nephrotoxic medication, daily BPM in place   Code Status: Full Code Family Communication: N/A Disposition Plan: Not yet ready for discharge  Lorel Roes NP  If 7PM-7AM, please contact night-coverage.  03/13/2024, 12:56 PM  LOS: 1 day

## 2024-03-13 NOTE — Progress Notes (Signed)
   03/13/24 1528  TOC Brief Assessment  Insurance and Status Reviewed  Patient has primary care physician Yes (Paseda, Folashade R, FNP)  Home environment has been reviewed yes from home  Prior level of function: independent  Prior/Current Home Services No current home services  Social Drivers of Health Review SDOH reviewed no interventions necessary  Readmission risk has been reviewed Yes

## 2024-03-14 DIAGNOSIS — A419 Sepsis, unspecified organism: Secondary | ICD-10-CM | POA: Diagnosis not present

## 2024-03-14 DIAGNOSIS — N390 Urinary tract infection, site not specified: Secondary | ICD-10-CM | POA: Diagnosis not present

## 2024-03-14 LAB — BASIC METABOLIC PANEL WITH GFR
Anion gap: 5 (ref 5–15)
BUN: 34 mg/dL — ABNORMAL HIGH (ref 6–20)
CO2: 21 mmol/L — ABNORMAL LOW (ref 22–32)
Calcium: 8.8 mg/dL — ABNORMAL LOW (ref 8.9–10.3)
Chloride: 111 mmol/L (ref 98–111)
Creatinine, Ser: 1.95 mg/dL — ABNORMAL HIGH (ref 0.61–1.24)
GFR, Estimated: 39 mL/min — ABNORMAL LOW (ref >60)
Glucose, Bld: 103 mg/dL — ABNORMAL HIGH (ref 70–99)
Potassium: 4.6 mmol/L (ref 3.5–5.1)
Sodium: 137 mmol/L (ref 135–145)

## 2024-03-14 NOTE — Plan of Care (Signed)
  Problem: Fluid Volume: Goal: Hemodynamic stability will improve Outcome: Progressing   Problem: Clinical Measurements: Goal: Diagnostic test results will improve Outcome: Progressing Goal: Signs and symptoms of infection will decrease Outcome: Progressing   Problem: Respiratory: Goal: Ability to maintain adequate ventilation will improve Outcome: Progressing   Problem: Education: Goal: Knowledge of General Education information will improve Description: Including pain rating scale, medication(s)/side effects and non-pharmacologic comfort measures Outcome: Progressing   Problem: Health Behavior/Discharge Planning: Goal: Ability to manage health-related needs will improve Outcome: Progressing   Problem: Clinical Measurements: Goal: Ability to maintain clinical measurements within normal limits will improve Outcome: Progressing Goal: Will remain free from infection Outcome: Progressing Goal: Diagnostic test results will improve Outcome: Progressing Goal: Respiratory complications will improve Outcome: Progressing Goal: Cardiovascular complication will be avoided Outcome: Progressing   Problem: Activity: Goal: Risk for activity intolerance will decrease Outcome: Progressing   Problem: Nutrition: Goal: Adequate nutrition will be maintained Outcome: Progressing   Problem: Coping: Goal: Level of anxiety will decrease Outcome: Progressing   Problem: Elimination: Goal: Will not experience complications related to bowel motility Outcome: Progressing Goal: Will not experience complications related to urinary retention Outcome: Progressing   Problem: Pain Managment: Goal: General experience of comfort will improve and/or be controlled Outcome: Progressing   Problem: Safety: Goal: Ability to remain free from injury will improve Outcome: Progressing   Problem: Skin Integrity: Goal: Risk for impaired skin integrity will decrease Outcome: Progressing   Problem:  Education: Goal: Knowledge of vaso-occlusive preventative measures will improve Outcome: Progressing Goal: Awareness of infection prevention will improve Outcome: Progressing Goal: Awareness of signs and symptoms of anemia will improve Outcome: Progressing Goal: Long-term complications will improve Outcome: Progressing   Problem: Self-Care: Goal: Ability to incorporate actions that prevent/reduce pain crisis will improve Outcome: Progressing   Problem: Bowel/Gastric: Goal: Gut motility will be maintained Outcome: Progressing   Problem: Tissue Perfusion: Goal: Complications related to inadequate tissue perfusion will be avoided or minimized Outcome: Progressing   Problem: Respiratory: Goal: Pulmonary complications will be avoided or minimized Outcome: Progressing Goal: Acute Chest Syndrome will be identified early to prevent complications Outcome: Progressing   Problem: Fluid Volume: Goal: Ability to maintain a balanced intake and output will improve Outcome: Progressing   Problem: Sensory: Goal: Pain level will decrease with appropriate interventions Outcome: Progressing   Problem: Health Behavior: Goal: Postive changes in compliance with treatment and prescription regimens will improve Outcome: Progressing   Problem: Education: Goal: Knowledge of vaso-occlusive preventative measures will improve Outcome: Progressing Goal: Awareness of infection prevention will improve Outcome: Progressing Goal: Awareness of signs and symptoms of anemia will improve Outcome: Progressing Goal: Long-term complications will improve Outcome: Progressing   Problem: Self-Care: Goal: Ability to incorporate actions that prevent/reduce pain crisis will improve Outcome: Progressing   Problem: Bowel/Gastric: Goal: Gut motility will be maintained Outcome: Progressing   Problem: Tissue Perfusion: Goal: Complications related to inadequate tissue perfusion will be avoided or  minimized Outcome: Progressing   Problem: Respiratory: Goal: Pulmonary complications will be avoided or minimized Outcome: Progressing Goal: Acute Chest Syndrome will be identified early to prevent complications Outcome: Progressing   Problem: Fluid Volume: Goal: Ability to maintain a balanced intake and output will improve Outcome: Progressing   Problem: Sensory: Goal: Pain level will decrease with appropriate interventions Outcome: Progressing   Problem: Health Behavior: Goal: Postive changes in compliance with treatment and prescription regimens will improve Outcome: Progressing

## 2024-03-14 NOTE — Progress Notes (Signed)
 Patient ID: Scott Norton, male   DOB: 06/11/1966, 58 y.o.   MRN: [id number] Subjective: Scott Norton is a 58 year old male with a medical history significant for sickle cell disease, diastolic CHF, history of cocaine abuse, depression with anxiety, and anemia of chronic disease who was admitted for sepsis due to UTI/left pyelonephritis in the setting of sickle cell pain crisis.  Today patient has no new complaint.  He said his pain is slowly improving but still significant.  He is rating his pain at 7/10 this morning.  He also has issues with homelessness and is worried about where to go after discharge.  He denies any fever, cough, chest pain, shortness of breath, nausea, vomiting or diarrhea.  Objective:  Vital signs in last 24 hours:  Vitals:   03/14/24 0231 03/14/24 0400 03/14/24 0724 03/14/24 1140  BP: 121/66     Pulse: 81     Resp: 18 20 13 16   Temp: 99.8 F (37.7 C)     TempSrc: Oral     SpO2: 94% 90%    Weight:      Height:        Intake/Output from previous day:   Intake/Output Summary (Last 24 hours) at 03/14/2024 1253 Last data filed at 03/14/2024 0747 Gross per 24 hour  Intake 300 ml  Output 800 ml  Net -500 ml    Physical Exam: General: Alert, awake, oriented x3, in no acute distress.  Disheveled. HEENT: [place]/AT PEERL, EOMI Neck: Trachea midline,  no masses, no thyromegal,y no JVD, no carotid bruit OROPHARYNX:  Moist, No exudate/ erythema/lesions.  Heart: Regular rate and rhythm, without murmurs, rubs, gallops, PMI non-displaced, no heaves or thrills on palpation.  Lungs: Clear to auscultation, no wheezing or rhonchi noted. No increased vocal fremitus resonant to percussion  Abdomen: Soft, nontender, nondistended, positive bowel sounds, no masses no hepatosplenomegaly noted..  Neuro: No focal neurological deficits noted cranial nerves II through XII grossly intact. DTRs 2+ bilaterally upper and lower extremities. Strength 5 out of 5 in bilateral upper and lower  extremities. Musculoskeletal: No warm swelling or erythema around joints, no spinal tenderness noted. Psychiatric: Patient alert and oriented x3, good insight and cognition, good recent to remote recall. Lymph node survey: No cervical axillary or inguinal lymphadenopathy noted.  Lab Results:  Basic Metabolic Panel:    Component Value Date/Time   NA 137 03/14/2024 0751   NA 140 12/18/2023 0849   K 4.6 03/14/2024 0751   CL 111 03/14/2024 0751   CO2 21 (L) 03/14/2024 0751   BUN 34 (H) 03/14/2024 0751   BUN 13 12/18/2023 0849   CREATININE 1.95 (H) 03/14/2024 0751   CREATININE 0.90 07/19/2014 1128   GLUCOSE 103 (H) 03/14/2024 0751   CALCIUM  8.8 (L) 03/14/2024 0751   CBC:    Component Value Date/Time   WBC 12.3 (H) 03/12/2024 0736   HGB 7.2 (L) 03/12/2024 0736   HGB 7.3 (L) 12/18/2023 0849   HCT 22.6 (L) 03/12/2024 0736   HCT 22.2 (L) 12/18/2023 0849   PLT 151 03/12/2024 0736   PLT 177 12/18/2023 0849   MCV 95.0 03/12/2024 0736   MCV 96 12/18/2023 0849   NEUTROABS 10.6 (H) 03/11/2024 1931   NEUTROABS 3.6 12/18/2023 0849   LYMPHSABS 1.9 03/11/2024 1931   LYMPHSABS 3.7 (H) 12/18/2023 0849   MONOABS 1.3 (H) 03/11/2024 1931   EOSABS 0.0 03/11/2024 1931   EOSABS 0.2 12/18/2023 0849   BASOSABS 0.0 03/11/2024 1931   BASOSABS 0.0 12/18/2023  1610    Recent Results (from the past 240 hours)  Culture, blood (Routine X 2) w Reflex to ID Panel     Status: None (Preliminary result)   Collection Time: 03/11/24  9:20 PM   Specimen: BLOOD  Result Value Ref Range Status   Specimen Description   Final    BLOOD LEFT ANTECUBITAL Performed at [facility], [address], [place], [place] [address]    Special Requests   Final    BOTTLES DRAWN AEROBIC AND ANAEROBIC Blood Culture results may not be optimal due to an inadequate volume of blood received in culture bottles Performed at [facility], [address] [place], [place] [address]    Culture    Final    NO GROWTH 3 DAYS Performed at [facility], [address], [place], [place] [address]    Report Status PENDING  Incomplete  Culture, blood (Routine X 2) w Reflex to ID Panel     Status: None (Preliminary result)   Collection Time: 03/11/24  9:25 PM   Specimen: BLOOD  Result Value Ref Range Status   Specimen Description   Final    BLOOD RIGHT ANTECUBITAL Performed at [facility], [address], [place], [place] [address]    Special Requests   Final    BOTTLES DRAWN AEROBIC AND ANAEROBIC Blood Culture results may not be optimal due to an inadequate volume of blood received in culture bottles Performed at [facility], [address] [place], [place] [address]    Culture   Final    NO GROWTH 3 DAYS Performed at [facility], [address], [place], [place] [address]    Report Status PENDING  Incomplete  Urine Culture (for pregnant, neutropenic or urologic patients or patients with an indwelling urinary catheter)     Status: None   Collection Time: 03/11/24 11:10 PM   Specimen: Urine, Clean Catch  Result Value Ref Range Status   Specimen Description   Final    URINE, CLEAN CATCH Performed at [facility], [address] [place], [place] [address]    Special Requests   Final    NONE Performed at [facility], [address], [place], [place] [address]    Culture   Final    NO GROWTH Performed at [facility], [address], [place], [place] [address]    Report Status 03/13/2024 FINAL  Final  Resp panel by RT-PCR (RSV, Flu A&B, Covid) Anterior Nasal Swab     Status: None   Collection Time: 03/11/24 11:14 PM   Specimen: Anterior Nasal Swab  Result Value Ref Range Status   SARS Coronavirus 2 by RT PCR NEGATIVE NEGATIVE Final    Comment: (NOTE) SARS-CoV-2 target nucleic acids are NOT DETECTED.  The SARS-CoV-2 RNA is generally detectable in upper respiratory specimens  during the acute phase of infection. The lowest concentration of SARS-CoV-2 viral copies this assay can detect is 138 copies/mL. A negative result does not preclude SARS-Cov-2 infection and should not be used as the sole basis for treatment or other patient management decisions. A negative result may occur with  improper specimen collection/handling, submission of specimen other than nasopharyngeal swab, presence of viral mutation(s) within the areas targeted by this assay, and inadequate number of viral copies(<138 copies/mL). A negative result must be combined with clinical observations, patient history, and epidemiological information. The expected result is Negative.  Fact  Sheet for Patients:  BloggerCourse.com  Fact Sheet for Healthcare Providers:  SeriousBroker.it  This test is no t yet approved or cleared by the [place]  FDA and  has been authorized for detection and/or diagnosis of SARS-CoV-2 by FDA under an Emergency Use Authorization (EUA). This EUA will remain  in effect (meaning this test can be used) for the duration of the COVID-19 declaration under Section 564(b)(1) of the Act, 21 U.S.C.section 360bbb-3(b)(1), unless the authorization is terminated  or revoked sooner.       Influenza A by PCR NEGATIVE NEGATIVE Final   Influenza B by PCR NEGATIVE NEGATIVE Final    Comment: (NOTE) The Xpert Xpress SARS-CoV-2/FLU/RSV plus assay is intended as an aid in the diagnosis of influenza from Nasopharyngeal swab specimens and should not be used as a sole basis for treatment. Nasal washings and aspirates are unacceptable for Xpert Xpress SARS-CoV-2/FLU/RSV testing.  Fact Sheet for Patients: BloggerCourse.com  Fact Sheet for Healthcare Providers: SeriousBroker.it  This test is not yet approved or cleared by the [place]  FDA and has been authorized for detection  and/or diagnosis of SARS-CoV-2 by FDA under an Emergency Use Authorization (EUA). This EUA will remain in effect (meaning this test can be used) for the duration of the COVID-19 declaration under Section 564(b)(1) of the Act, 21 U.S.C. section 360bbb-3(b)(1), unless the authorization is terminated or revoked.     Resp Syncytial Virus by PCR NEGATIVE NEGATIVE Final    Comment: (NOTE) Fact Sheet for Patients: BloggerCourse.com  Fact Sheet for Healthcare Providers: SeriousBroker.it  This test is not yet approved or cleared by the [place]  FDA and has been authorized for detection and/or diagnosis of SARS-CoV-2 by FDA under an Emergency Use Authorization (EUA). This EUA will remain in effect (meaning this test can be used) for the duration of the COVID-19 declaration under Section 564(b)(1) of the Act, 21 U.S.C. section 360bbb-3(b)(1), unless the authorization is terminated or revoked.  Performed at [facility] [address] [place], [place] [address]     Studies/Results: DG Chest 2 View Result Date: 03/13/2024 CLINICAL DATA:  Hypoxia. EXAM: CHEST - 2 VIEW COMPARISON:  Chest x-ray November 5, 24. FINDINGS: Similar linear opacities at the lung bases. No new confluent consolidation. No visible pleural effusions or pneumothorax. Similar cardiac silhouette. IMPRESSION: Streaky bibasilar opacities, most likely atelectasis/scarring. Electronically Signed   By: Stevenson Elbe M.D.   On: 03/13/2024 15:16    Medications: Scheduled Meds:  cephALEXin   500 mg Oral QID   enoxaparin  (LOVENOX ) injection  40 mg Subcutaneous Q24H   HYDROmorphone    Intravenous Q4H   sodium chloride  flush  3 mL Intravenous Q12H   Continuous Infusions: PRN Meds:.acetaminophen  **OR** [DISCONTINUED] acetaminophen , camphor-menthol , diphenhydrAMINE , guaiFENesin -dextromethorphan , naloxone  **AND** sodium chloride  flush, ondansetron  **OR**  ondansetron  (ZOFRAN ) IV, mouth rinse, oxyCODONE -acetaminophen  **AND** oxyCODONE , senna-docusate  Consultants: None  Procedures: None  Antibiotics: Currently on cephalexin   Assessment/Plan: Principal Problem:   Sepsis due to urinary tract infection (HCC) Active Problems:   Sickle cell anemia (HCC)   Chronic kidney disease, stage 3a (HCC)   Abnormal liver CT  Sepsis due to UTI/left pyelonephritis: Leukocytosis has improved significantly.  No more fever.  Blood cultures shows no growth.  Urine output is adequate.  Will continue cephalexin  antibiotics for total of 5 days.Aaron Aas Hb Sickle Cell Disease with Pain crisis: IV fluid at KVO., continue weight based Dilaudid  PCA at current dose setting, IV Toradol  is contraindicated due to CKD.  Continue oral home pain medications as  ordered. Monitor vitals very closely, Re-evaluate pain scale regularly, 2 L of Oxygen by . Patient encouraged to ambulate on the hallway today.  Leukocytosis: Resolved. Anemia of Chronic Disease: Hemoglobin was 7.2 3 days ago with robust reticulocytosis.  Will recheck labs tomorrow morning. Chronic pain Syndrome: Continue oral home pain medications as ordered. CKD stage III: Avoid all nephrotoxins.  Continue current management.  Code Status: Full Code Family Communication: N/A Disposition Plan: Not yet ready for discharge  Jolena Kittle  If 7PM-7AM, please contact night-coverage.  03/14/2024, 12:53 PM  LOS: 2 days

## 2024-03-14 NOTE — Progress Notes (Signed)
  [facility] EAST ONCOLOGY [address] [place], [place]  [address] Phone:  [phone]  Mar 14, 2024   To Whom It May Concern:     Please excuse Scott Abed., from 03/12/24-03/13/24 to accompany a family member's hospitalization. This note confirms her presence on the dates listed previously.    Sincerely,  Eugena Herter, RN BSN

## 2024-03-15 DIAGNOSIS — N390 Urinary tract infection, site not specified: Secondary | ICD-10-CM | POA: Diagnosis not present

## 2024-03-15 DIAGNOSIS — A419 Sepsis, unspecified organism: Secondary | ICD-10-CM | POA: Diagnosis not present

## 2024-03-15 LAB — BASIC METABOLIC PANEL WITH GFR
Anion gap: 7 (ref 5–15)
BUN: 37 mg/dL — ABNORMAL HIGH (ref 6–20)
CO2: 20 mmol/L — ABNORMAL LOW (ref 22–32)
Calcium: 8.9 mg/dL (ref 8.9–10.3)
Chloride: 112 mmol/L — ABNORMAL HIGH (ref 98–111)
Creatinine, Ser: 1.69 mg/dL — ABNORMAL HIGH (ref 0.61–1.24)
GFR, Estimated: 47 mL/min — ABNORMAL LOW (ref >60)
Glucose, Bld: 89 mg/dL (ref 70–99)
Potassium: 4.5 mmol/L (ref 3.5–5.1)
Sodium: 139 mmol/L (ref 135–145)

## 2024-03-15 LAB — CBC WITH DIFFERENTIAL/PLATELET
Abs Immature Granulocytes: 0.11 10*3/uL — ABNORMAL HIGH (ref 0.00–0.07)
Basophils Absolute: 0 10*3/uL (ref 0.0–0.1)
Basophils Relative: 0 %
Eosinophils Absolute: 0.5 10*3/uL (ref 0.0–0.5)
Eosinophils Relative: 6 %
HCT: 14.7 % — ABNORMAL LOW (ref 39.0–52.0)
Hemoglobin: 5.2 g/dL — CL (ref 13.0–17.0)
Immature Granulocytes: 1 %
Lymphocytes Relative: 30 %
Lymphs Abs: 2.5 10*3/uL (ref 0.7–4.0)
MCH: 29.5 pg (ref 26.0–34.0)
MCHC: 35.4 g/dL (ref 30.0–36.0)
MCV: 83.5 fL (ref 80.0–100.0)
Monocytes Absolute: 1.2 10*3/uL — ABNORMAL HIGH (ref 0.1–1.0)
Monocytes Relative: 15 %
Neutro Abs: 3.9 10*3/uL (ref 1.7–7.7)
Neutrophils Relative %: 48 %
Platelets: 117 10*3/uL — ABNORMAL LOW (ref 150–400)
RBC: 1.76 MIL/uL — ABNORMAL LOW (ref 4.22–5.81)
RDW: 21.5 % — ABNORMAL HIGH (ref 11.5–15.5)
WBC: 8.3 10*3/uL (ref 4.0–10.5)
nRBC: 3.4 % — ABNORMAL HIGH (ref 0.0–0.2)

## 2024-03-15 LAB — PREPARE RBC (CROSSMATCH)

## 2024-03-15 MED ORDER — SODIUM CHLORIDE 0.9% IV SOLUTION: Freq: Once | INTRAVENOUS | Status: DC

## 2024-03-15 NOTE — Progress Notes (Signed)
 Patient ID: Scott Norton, male   DOB: [birth year], 59 y.o.   MRN: [id number] Subjective: Scott Norton is a 58 year old male with a medical history significant for sickle cell disease, diastolic CHF, history of cocaine abuse, depression with anxiety, and anemia of chronic disease who was admitted for sepsis due to UTI/left pyelonephritis in the setting of sickle cell pain crisis.  Patient's general condition remains the same.  He has no new complaint today.  He said his pain is slightly improved.  He has no fever, chest pain, shortness of breath, nausea, vomiting or diarrhea.  Objective:  Vital signs in last 24 hours:  Vitals:   03/15/24 1054 03/15/24 1126 03/15/24 1236 03/15/24 1300  BP: (!) 104/54  (!) 113/46 (!) 111/48  Pulse: 79  67 64  Resp: 16 14 16 14   Temp: 98.4 F (36.9 C)  97.8 F (36.6 C) 97.9 F (36.6 C)  TempSrc: Oral  Oral   SpO2: 98% 93% 100% 95%  Weight:      Height:        Intake/Output from previous day:   Intake/Output Summary (Last 24 hours) at 03/15/2024 1301 Last data filed at 03/15/2024 1000 Gross per 24 hour  Intake 600 ml  Output 1400 ml  Net -800 ml    Physical Exam: General: Alert, awake, oriented x3, in no acute distress.  Disheveled. HEENT: [place]/AT PEERL, EOMI Neck: Trachea midline,  no masses, no thyromegal,y no JVD, no carotid bruit OROPHARYNX:  Moist, No exudate/ erythema/lesions.  Heart: Regular rate and rhythm, without murmurs, rubs, gallops, PMI non-displaced, no heaves or thrills on palpation.  Lungs: Clear to auscultation, no wheezing or rhonchi noted. No increased vocal fremitus resonant to percussion  Abdomen: Soft, nontender, nondistended, positive bowel sounds, no masses no hepatosplenomegaly noted..  Neuro: No focal neurological deficits noted cranial nerves II through XII grossly intact. DTRs 2+ bilaterally upper and lower extremities. Strength 5 out of 5 in bilateral upper and lower extremities. Musculoskeletal: No warm swelling or  erythema around joints, no spinal tenderness noted. Psychiatric: Patient alert and oriented x3, good insight and cognition, good recent to remote recall. Lymph node survey: No cervical axillary or inguinal lymphadenopathy noted.  Lab Results:  Basic Metabolic Panel:    Component Value Date/Time   NA 139 03/15/2024 0707   NA 140 12/18/2023 0849   K 4.5 03/15/2024 0707   CL 112 (H) 03/15/2024 0707   CO2 20 (L) 03/15/2024 0707   BUN 37 (H) 03/15/2024 0707   BUN 13 12/18/2023 0849   CREATININE 1.69 (H) 03/15/2024 0707   CREATININE 0.90 07/19/2014 1128   GLUCOSE 89 03/15/2024 0707   CALCIUM  8.9 03/15/2024 0707   CBC:    Component Value Date/Time   WBC 8.3 03/15/2024 0707   HGB 5.2 (LL) 03/15/2024 0707   HGB 7.3 (L) 12/18/2023 0849   HCT 14.7 (L) 03/15/2024 0707   HCT 22.2 (L) 12/18/2023 0849   PLT 117 (L) 03/15/2024 0707   PLT 177 12/18/2023 0849   MCV 83.5 03/15/2024 0707   MCV 96 12/18/2023 0849   NEUTROABS 3.9 03/15/2024 0707   NEUTROABS 3.6 12/18/2023 0849   LYMPHSABS 2.5 03/15/2024 0707   LYMPHSABS 3.7 (H) 12/18/2023 0849   MONOABS 1.2 (H) 03/15/2024 0707   EOSABS 0.5 03/15/2024 0707   EOSABS 0.2 12/18/2023 0849   BASOSABS 0.0 03/15/2024 0707   BASOSABS 0.0 12/18/2023 0849    Recent Results (from the past 240 hours)  Culture, blood (Routine X  2) w Reflex to ID Panel     Status: None (Preliminary result)   Collection Time: 03/11/24  9:20 PM   Specimen: BLOOD  Result Value Ref Range Status   Specimen Description   Final    BLOOD LEFT ANTECUBITAL Performed at [facility], [address] [place], [place] [address]    Special Requests   Final    BOTTLES DRAWN AEROBIC AND ANAEROBIC Blood Culture results may not be optimal due to an inadequate volume of blood received in culture bottles Performed at [facility], [address], [place], [place] [address]    Culture   Final    NO GROWTH 4 DAYS Performed at [facility], [address], [place], [place] [address]    Report Status PENDING  Incomplete  Culture, blood (Routine X 2) w Reflex to ID Panel     Status: None (Preliminary result)   Collection Time: 03/11/24  9:25 PM   Specimen: BLOOD  Result Value Ref Range Status   Specimen Description   Final    BLOOD RIGHT ANTECUBITAL Performed at [facility], [address], [place], [place] [address]    Special Requests   Final    BOTTLES DRAWN AEROBIC AND ANAEROBIC Blood Culture results may not be optimal due to an inadequate volume of blood received in culture bottles Performed at [facility], [address], [place], [place] [address]    Culture   Final    NO GROWTH 4 DAYS Performed at [facility], [address], [place], [place] [address]    Report Status PENDING  Incomplete  Urine Culture (for pregnant, neutropenic or urologic patients or patients with an indwelling urinary catheter)     Status: None   Collection Time: 03/11/24 11:10 PM   Specimen: Urine, Clean Catch  Result Value Ref Range Status   Specimen Description   Final    URINE, CLEAN CATCH Performed at [facility], [address], [place], [place] [address]    Special Requests   Final    NONE Performed at [facility], [address], [place], [place] [address]    Culture   Final    NO GROWTH Performed at [facility], [address], [place], [place] [address]    Report Status 03/13/2024 FINAL  Final  Resp panel by RT-PCR (RSV, Flu A&B, Covid) Anterior Nasal Swab     Status: None   Collection Time: 03/11/24 11:14 PM   Specimen: Anterior Nasal Swab  Result Value Ref Range Status   SARS Coronavirus 2 by RT PCR NEGATIVE NEGATIVE Final    Comment: (NOTE) SARS-CoV-2 target nucleic acids are NOT DETECTED.  The SARS-CoV-2 RNA is generally detectable in upper respiratory specimens during the acute phase of infection. The lowest concentration  of SARS-CoV-2 viral copies this assay can detect is 138 copies/mL. A negative result does not preclude SARS-Cov-2 infection and should not be used as the sole basis for treatment or other patient management decisions. A negative result may occur with  improper specimen collection/handling, submission of specimen other than nasopharyngeal swab, presence of viral mutation(s) within the areas targeted by this assay, and inadequate number of viral copies(<138 copies/mL). A negative result must be combined with clinical observations, patient history, and epidemiological information. The expected result is Negative.  Fact Sheet for Patients:  BloggerCourse.com  Fact Sheet for Healthcare Providers:  SeriousBroker.it  This test  is no t yet approved or cleared by the [place]  FDA and  has been authorized for detection and/or diagnosis of SARS-CoV-2 by FDA under an Emergency Use Authorization (EUA). This EUA will remain  in effect (meaning this test can be used) for the duration of the COVID-19 declaration under Section 564(b)(1) of the Act, 21 U.S.C.section 360bbb-3(b)(1), unless the authorization is terminated  or revoked sooner.       Influenza A by PCR NEGATIVE NEGATIVE Final   Influenza B by PCR NEGATIVE NEGATIVE Final    Comment: (NOTE) The Xpert Xpress SARS-CoV-2/FLU/RSV plus assay is intended as an aid in the diagnosis of influenza from Nasopharyngeal swab specimens and should not be used as a sole basis for treatment. Nasal washings and aspirates are unacceptable for Xpert Xpress SARS-CoV-2/FLU/RSV testing.  Fact Sheet for Patients: BloggerCourse.com  Fact Sheet for Healthcare Providers: SeriousBroker.it  This test is not yet approved or cleared by the [place]  FDA and has been authorized for detection and/or diagnosis of SARS-CoV-2 by FDA under an Emergency Use  Authorization (EUA). This EUA will remain in effect (meaning this test can be used) for the duration of the COVID-19 declaration under Section 564(b)(1) of the Act, 21 U.S.C. section 360bbb-3(b)(1), unless the authorization is terminated or revoked.     Resp Syncytial Virus by PCR NEGATIVE NEGATIVE Final    Comment: (NOTE) Fact Sheet for Patients: BloggerCourse.com  Fact Sheet for Healthcare Providers: SeriousBroker.it  This test is not yet approved or cleared by the [place]  FDA and has been authorized for detection and/or diagnosis of SARS-CoV-2 by FDA under an Emergency Use Authorization (EUA). This EUA will remain in effect (meaning this test can be used) for the duration of the COVID-19 declaration under Section 564(b)(1) of the Act, 21 U.S.C. section 360bbb-3(b)(1), unless the authorization is terminated or revoked.  Performed at [facility], [address] [place], [place] [address]     Studies/Results: No results found.   Medications: Scheduled Meds:  cephALEXin   500 mg Oral QID   enoxaparin  (LOVENOX ) injection  40 mg Subcutaneous Q24H   HYDROmorphone    Intravenous Q4H   sodium chloride  flush  3 mL Intravenous Q12H   Continuous Infusions: PRN Meds:.acetaminophen  **OR** [DISCONTINUED] acetaminophen , camphor-menthol , diphenhydrAMINE , guaiFENesin -dextromethorphan , naloxone  **AND** sodium chloride  flush, ondansetron  **OR** ondansetron  (ZOFRAN ) IV, mouth rinse, oxyCODONE -acetaminophen  **AND** oxyCODONE , senna-docusate  Consultants: None  Procedures: None  Antibiotics: Currently on cephalexin   Assessment/Plan: Principal Problem:   Sepsis due to urinary tract infection (HCC) Active Problems:   Sickle cell anemia (HCC)   Chronic kidney disease, stage 3a (HCC)   Abnormal liver CT   Lower urinary tract infectious disease  Sepsis due to UTI/left pyelonephritis: Leukocytosis has improved  significantly.  No more fever. Blood cultures shows no growth. Urine output is adequate.  Urine culture also shows no growth. Will continue cephalexin  antibiotics for total of 5 days.Scott Norton Hb Sickle Cell Disease with Pain crisis: Continue IV fluid at KVO., continue weight based Dilaudid  PCA at current dose setting, IV Toradol  is contraindicated due to CKD.  Continue oral home pain medications as ordered. Monitor vitals very closely, Re-evaluate pain scale regularly, 2 L of Oxygen by [place]. Patient encouraged to ambulate on the hallway today.  Leukocytosis: Improved. Anemia of Chronic Disease: Hemoglobin has dropped significantly again today to 5.2.  We will transfuse patient with 2 units of packed red blood cells today.  Will start him on Aranesp on this admission and then biweekly. Chronic pain Syndrome:  Continue oral home pain medications as ordered. CKD stage III: Avoid all nephrotoxins.  Continue current management. Homelessness: Patient is currently on housed, will consult TOC and clinical social worker for discharge planning.  Code Status: Full Code Family Communication: N/A Disposition Plan: Discharge planned for tomorrow, 03/16/2024.  Scott Norton  If 7PM-7AM, please contact night-coverage.  03/15/2024, 1:01 PM  LOS: 3 days

## 2024-03-15 NOTE — Progress Notes (Signed)
  [facility] EAST ONCOLOGY [address] [place], [place]  [address] Phone:  [phone]  Mar 15, 2024   To Whom It May Concern:  Please excuse Scott Abed., from 03/12/24-03/16/24 to accompany a family member's hospitalization. This note confirms her presence on the dates listed previously.    Sincerely,  Dedrick Fanti, RN BSN

## 2024-03-15 NOTE — Plan of Care (Signed)
  Problem: Clinical Measurements: Goal: Diagnostic test results will improve Outcome: Progressing Goal: Signs and symptoms of infection will decrease Outcome: Progressing   Problem: Respiratory: Goal: Ability to maintain adequate ventilation will improve Outcome: Progressing   Problem: Education: Goal: Knowledge of General Education information will improve Description: Including pain rating scale, medication(s)/side effects and non-pharmacologic comfort measures Outcome: Progressing   Problem: Health Behavior/Discharge Planning: Goal: Ability to manage health-related needs will improve Outcome: Progressing   Problem: Clinical Measurements: Goal: Will remain free from infection Outcome: Progressing Goal: Respiratory complications will improve Outcome: Progressing Goal: Cardiovascular complication will be avoided Outcome: Progressing

## 2024-03-16 LAB — CULTURE, BLOOD (ROUTINE X 2)
Culture: NO GROWTH
Culture: NO GROWTH

## 2024-03-16 LAB — BASIC METABOLIC PANEL WITH GFR
Anion gap: 8 (ref 5–15)
BUN: 32 mg/dL — ABNORMAL HIGH (ref 6–20)
CO2: 20 mmol/L — ABNORMAL LOW (ref 22–32)
Calcium: 9 mg/dL (ref 8.9–10.3)
Chloride: 109 mmol/L (ref 98–111)
Creatinine, Ser: 1.37 mg/dL — ABNORMAL HIGH (ref 0.61–1.24)
GFR, Estimated: >60 mL/min (ref >60)
Glucose, Bld: 92 mg/dL (ref 70–99)
Potassium: 5.1 mmol/L (ref 3.5–5.1)
Sodium: 137 mmol/L (ref 135–145)

## 2024-03-16 LAB — CBC
HCT: 21.2 % — ABNORMAL LOW (ref 39.0–52.0)
Hemoglobin: 7.4 g/dL — ABNORMAL LOW (ref 13.0–17.0)
MCH: 30.3 pg (ref 26.0–34.0)
MCHC: 34.9 g/dL (ref 30.0–36.0)
MCV: 86.9 fL (ref 80.0–100.0)
Platelets: 121 10*3/uL — ABNORMAL LOW (ref 150–400)
RBC: 2.44 MIL/uL — ABNORMAL LOW (ref 4.22–5.81)
RDW: 20.8 % — ABNORMAL HIGH (ref 11.5–15.5)
WBC: 7.7 10*3/uL (ref 4.0–10.5)
nRBC: 4.7 % — ABNORMAL HIGH (ref 0.0–0.2)

## 2024-03-16 LAB — TYPE AND SCREEN
ABO/RH(D): O POS
Antibody Screen: NEGATIVE
Donor AG Type: NEGATIVE
Donor AG Type: NEGATIVE
Unit division: 0
Unit division: 0

## 2024-03-16 LAB — BPAM RBC
Blood Product Expiration Date: 202506102359
Blood Product Expiration Date: 202506112359
ISSUE DATE / TIME: 202505251235
ISSUE DATE / TIME: 202505251636
Unit Type and Rh: 5100
Unit Type and Rh: 5100

## 2024-03-16 NOTE — Plan of Care (Signed)
  Problem: Fluid Volume: Goal: Hemodynamic stability will improve Outcome: Progressing   Problem: Clinical Measurements: Goal: Diagnostic test results will improve Outcome: Progressing Goal: Signs and symptoms of infection will decrease Outcome: Progressing   Problem: Respiratory: Goal: Ability to maintain adequate ventilation will improve Outcome: Progressing   Problem: Education: Goal: Knowledge of General Education information will improve Description: Including pain rating scale, medication(s)/side effects and non-pharmacologic comfort measures Outcome: Progressing   Problem: Health Behavior/Discharge Planning: Goal: Ability to manage health-related needs will improve Outcome: Progressing   Problem: Clinical Measurements: Goal: Ability to maintain clinical measurements within normal limits will improve Outcome: Progressing Goal: Will remain free from infection Outcome: Progressing Goal: Diagnostic test results will improve Outcome: Progressing Goal: Respiratory complications will improve Outcome: Progressing Goal: Cardiovascular complication will be avoided Outcome: Progressing   Problem: Activity: Goal: Risk for activity intolerance will decrease Outcome: Progressing   Problem: Nutrition: Goal: Adequate nutrition will be maintained Outcome: Progressing   Problem: Coping: Goal: Level of anxiety will decrease Outcome: Progressing   Problem: Elimination: Goal: Will not experience complications related to bowel motility Outcome: Progressing Goal: Will not experience complications related to urinary retention Outcome: Progressing   Problem: Pain Managment: Goal: General experience of comfort will improve and/or be controlled Outcome: Progressing   Problem: Safety: Goal: Ability to remain free from injury will improve Outcome: Progressing   Problem: Skin Integrity: Goal: Risk for impaired skin integrity will decrease Outcome: Progressing   Problem:  Education: Goal: Knowledge of vaso-occlusive preventative measures will improve Outcome: Progressing Goal: Awareness of infection prevention will improve Outcome: Progressing Goal: Awareness of signs and symptoms of anemia will improve Outcome: Progressing Goal: Long-term complications will improve Outcome: Progressing   Problem: Self-Care: Goal: Ability to incorporate actions that prevent/reduce pain crisis will improve Outcome: Progressing   Problem: Bowel/Gastric: Goal: Gut motility will be maintained Outcome: Progressing   Problem: Tissue Perfusion: Goal: Complications related to inadequate tissue perfusion will be avoided or minimized Outcome: Progressing   Problem: Respiratory: Goal: Pulmonary complications will be avoided or minimized Outcome: Progressing Goal: Acute Chest Syndrome will be identified early to prevent complications Outcome: Progressing   Problem: Fluid Volume: Goal: Ability to maintain a balanced intake and output will improve Outcome: Progressing   Problem: Sensory: Goal: Pain level will decrease with appropriate interventions Outcome: Progressing   Problem: Health Behavior: Goal: Postive changes in compliance with treatment and prescription regimens will improve Outcome: Progressing   Problem: Education: Goal: Knowledge of vaso-occlusive preventative measures will improve Outcome: Progressing Goal: Awareness of infection prevention will improve Outcome: Progressing Goal: Awareness of signs and symptoms of anemia will improve Outcome: Progressing Goal: Long-term complications will improve Outcome: Progressing   Problem: Self-Care: Goal: Ability to incorporate actions that prevent/reduce pain crisis will improve Outcome: Progressing   Problem: Bowel/Gastric: Goal: Gut motility will be maintained Outcome: Progressing   Problem: Tissue Perfusion: Goal: Complications related to inadequate tissue perfusion will be avoided or  minimized Outcome: Progressing   Problem: Respiratory: Goal: Pulmonary complications will be avoided or minimized Outcome: Progressing Goal: Acute Chest Syndrome will be identified early to prevent complications Outcome: Progressing   Problem: Fluid Volume: Goal: Ability to maintain a balanced intake and output will improve Outcome: Progressing   Problem: Sensory: Goal: Pain level will decrease with appropriate interventions Outcome: Progressing   Problem: Health Behavior: Goal: Postive changes in compliance with treatment and prescription regimens will improve Outcome: Progressing

## 2024-03-16 NOTE — Plan of Care (Signed)
  Problem: Respiratory: Goal: Ability to maintain adequate ventilation will improve Outcome: Progressing   Problem: Activity: Goal: Risk for activity intolerance will decrease Outcome: Progressing   Problem: Nutrition: Goal: Adequate nutrition will be maintained Outcome: Progressing   Problem: Pain Managment: Goal: General experience of comfort will improve and/or be controlled Outcome: Progressing   Problem: Skin Integrity: Goal: Risk for impaired skin integrity will decrease Outcome: Progressing

## 2024-03-16 NOTE — Progress Notes (Addendum)
 Patient ID: Scott Norton, male   DOB: Feb 02, 1966, 58 y.o.   MRN: [id number] Subjective: Scott Norton is a 58 year old male with a medical history significant for sickle cell disease, diastolic CHF, history of cocaine abuse, depression with anxiety, and anemia of chronic disease who was admitted for sepsis due to UTI/left pyelonephritis in the setting of sickle cell pain crisis.  Patient has no new complaint today.  Reports pain is 5/10, he has no fever, chest pain, shortness of breath, nausea, vomiting or diarrhea.  Objective:  Vital signs in last 24 hours:  Vitals:   03/16/24 0816 03/16/24 1006 03/16/24 1207 03/16/24 1355  BP:  118/72  125/68  Pulse:  (!) 57  (!) 59  Resp: 17 18 18 17   Temp:  98.6 F (37 C)  98.6 F (37 C)  TempSrc:  Oral  Oral  SpO2:  (!) 89%  (!) 89%  Weight:      Height:        Intake/Output from previous day:   Intake/Output Summary (Last 24 hours) at 03/16/2024 1630 Last data filed at 03/16/2024 1415 Gross per 24 hour  Intake 612.33 ml  Output 1650 ml  Net -1037.67 ml    Physical Exam: General: Alert, awake, oriented x3, in no acute distress.  Disheveled. HEENT: [place]/AT PEERL, EOMI Neck: Trachea midline,  no masses, no thyromegal,y no JVD, no carotid bruit OROPHARYNX:  Moist, No exudate/ erythema/lesions.  Heart: Regular rate and rhythm, + loud murmurs, 3/6 systolic, no rubs or gallops, PMI non-displaced, no heaves or thrills on palpation.  Lungs: Clear to auscultation, no wheezing or rhonchi noted. No increased vocal fremitus resonant to percussion  Abdomen: Soft, nontender, nondistended, positive bowel sounds, no masses no hepatosplenomegaly noted..  Neuro: No focal neurological deficits noted cranial nerves II through XII grossly intact. DTRs 2+ bilaterally upper and lower extremities. Strength 5 out of 5 in bilateral upper and lower extremities. Musculoskeletal: bilateral lower extremity pain  Psychiatric: Patient alert and oriented x3, good insight  and cognition, good recent to remote recall. Lymph node survey: No cervical axillary or inguinal lymphadenopathy noted.  Lab Results:  Basic Metabolic Panel:    Component Value Date/Time   NA 137 03/16/2024 0548   NA 140 12/18/2023 0849   K 5.1 03/16/2024 0548   CL 109 03/16/2024 0548   CO2 20 (L) 03/16/2024 0548   BUN 32 (H) 03/16/2024 0548   BUN 13 12/18/2023 0849   CREATININE 1.37 (H) 03/16/2024 0548   CREATININE 0.90 07/19/2014 1128   GLUCOSE 92 03/16/2024 0548   CALCIUM  9.0 03/16/2024 0548   CBC:    Component Value Date/Time   WBC 7.7 03/16/2024 0548   HGB 7.4 (L) 03/16/2024 0548   HGB 7.3 (L) 12/18/2023 0849   HCT 21.2 (L) 03/16/2024 0548   HCT 22.2 (L) 12/18/2023 0849   PLT 121 (L) 03/16/2024 0548   PLT 177 12/18/2023 0849   MCV 86.9 03/16/2024 0548   MCV 96 12/18/2023 0849   NEUTROABS 3.9 03/15/2024 0707   NEUTROABS 3.6 12/18/2023 0849   LYMPHSABS 2.5 03/15/2024 0707   LYMPHSABS 3.7 (H) 12/18/2023 0849   MONOABS 1.2 (H) 03/15/2024 0707   EOSABS 0.5 03/15/2024 0707   EOSABS 0.2 12/18/2023 0849   BASOSABS 0.0 03/15/2024 0707   BASOSABS 0.0 12/18/2023 0849    Recent Results (from the past 240 hours)  Culture, blood (Routine X 2) w Reflex to ID Panel     Status: None   Collection  Time: 03/11/24  9:20 PM   Specimen: BLOOD  Result Value Ref Range Status   Specimen Description   Final    BLOOD LEFT ANTECUBITAL Performed at [facility], [address] [place], [place] [address]    Special Requests   Final    BOTTLES DRAWN AEROBIC AND ANAEROBIC Blood Culture results may not be optimal due to an inadequate volume of blood received in culture bottles Performed at [facility], [address] [place], [place] [address]    Culture   Final    NO GROWTH 5 DAYS Performed at [facility], [address] [place], [place] [address]    Report Status 03/16/2024 FINAL  Final  Culture, blood (Routine X 2) w Reflex to ID  Panel     Status: None   Collection Time: 03/11/24  9:25 PM   Specimen: BLOOD  Result Value Ref Range Status   Specimen Description   Final    BLOOD RIGHT ANTECUBITAL Performed at [facility], [address] [place], [place] [address]    Special Requests   Final    BOTTLES DRAWN AEROBIC AND ANAEROBIC Blood Culture results may not be optimal due to an inadequate volume of blood received in culture bottles Performed at [facility], [address] [place], [place] [address]    Culture   Final    NO GROWTH 5 DAYS Performed at [facility], [address], [place], [place] [address]    Report Status 03/16/2024 FINAL  Final  Urine Culture (for pregnant, neutropenic or urologic patients or patients with an indwelling urinary catheter)     Status: None   Collection Time: 03/11/24 11:10 PM   Specimen: Urine, Clean Catch  Result Value Ref Range Status   Specimen Description   Final    URINE, CLEAN CATCH Performed at [facility], [address] [place], [place] [address]    Special Requests   Final    NONE Performed at [facility], [address] [place], [place] [address]    Culture   Final    NO GROWTH Performed at [facility], [address], [place], [place] [address]    Report Status 03/13/2024 FINAL  Final  Resp panel by RT-PCR (RSV, Flu A&B, Covid) Anterior Nasal Swab     Status: None   Collection Time: 03/11/24 11:14 PM   Specimen: Anterior Nasal Swab  Result Value Ref Range Status   SARS Coronavirus 2 by RT PCR NEGATIVE NEGATIVE Final    Comment: (NOTE) SARS-CoV-2 target nucleic acids are NOT DETECTED.  The SARS-CoV-2 RNA is generally detectable in upper respiratory specimens during the acute phase of infection. The lowest concentration of SARS-CoV-2 viral copies this assay can detect is 138 copies/mL. A negative result does not preclude SARS-Cov-2 infection and should not be  used as the sole basis for treatment or other patient management decisions. A negative result may occur with  improper specimen collection/handling, submission of specimen other than nasopharyngeal swab, presence of viral mutation(s) within the areas targeted by this assay, and inadequate number of viral copies(<138 copies/mL). A negative result must be combined with clinical observations, patient history, and epidemiological information. The expected result is Negative.  Fact Sheet for Patients:  BloggerCourse.com  Fact Sheet for Healthcare Providers:  SeriousBroker.it  This test is no t yet approved or cleared by the [place]  FDA and  has been authorized  for detection and/or diagnosis of SARS-CoV-2 by FDA under an Emergency Use Authorization (EUA). This EUA will remain  in effect (meaning this test can be used) for the duration of the COVID-19 declaration under Section 564(b)(1) of the Act, 21 U.S.C.section 360bbb-3(b)(1), unless the authorization is terminated  or revoked sooner.       Influenza A by PCR NEGATIVE NEGATIVE Final   Influenza B by PCR NEGATIVE NEGATIVE Final    Comment: (NOTE) The Xpert Xpress SARS-CoV-2/FLU/RSV plus assay is intended as an aid in the diagnosis of influenza from Nasopharyngeal swab specimens and should not be used as a sole basis for treatment. Nasal washings and aspirates are unacceptable for Xpert Xpress SARS-CoV-2/FLU/RSV testing.  Fact Sheet for Patients: BloggerCourse.com  Fact Sheet for Healthcare Providers: SeriousBroker.it  This test is not yet approved or cleared by the [place]  FDA and has been authorized for detection and/or diagnosis of SARS-CoV-2 by FDA under an Emergency Use Authorization (EUA). This EUA will remain in effect (meaning this test can be used) for the duration of the COVID-19 declaration under Section  564(b)(1) of the Act, 21 U.S.C. section 360bbb-3(b)(1), unless the authorization is terminated or revoked.     Resp Syncytial Virus by PCR NEGATIVE NEGATIVE Final    Comment: (NOTE) Fact Sheet for Patients: BloggerCourse.com  Fact Sheet for Healthcare Providers: SeriousBroker.it  This test is not yet approved or cleared by the [place]  FDA and has been authorized for detection and/or diagnosis of SARS-CoV-2 by FDA under an Emergency Use Authorization (EUA). This EUA will remain in effect (meaning this test can be used) for the duration of the COVID-19 declaration under Section 564(b)(1) of the Act, 21 U.S.C. section 360bbb-3(b)(1), unless the authorization is terminated or revoked.  Performed at [facility], [address], [place], [place] [address]     Studies/Results: No results found.   Medications: Scheduled Meds:  cephALEXin   500 mg Oral QID   enoxaparin  (LOVENOX ) injection  40 mg Subcutaneous Q24H   HYDROmorphone    Intravenous Q4H   sodium chloride  flush  3 mL Intravenous Q12H   Continuous Infusions: PRN Meds:.acetaminophen  **OR** [DISCONTINUED] acetaminophen , camphor-menthol , diphenhydrAMINE , guaiFENesin -dextromethorphan , naloxone  **AND** sodium chloride  flush, ondansetron  **OR** ondansetron  (ZOFRAN ) IV, mouth rinse, oxyCODONE -acetaminophen  **AND** oxyCODONE , senna-docusate  Consultants: None  Procedures: None  Antibiotics: Currently on cephalexin   Assessment/Plan: Principal Problem:   Sepsis due to urinary tract infection (HCC) Active Problems:   Sickle cell anemia (HCC)   Chronic kidney disease, stage 3a (HCC)   Abnormal liver CT   Lower urinary tract infectious disease  Sepsis due to UTI/left pyelonephritis: Leukocytosis has improved significantly.  No more fever. Blood cultures shows no growth. Urine output is adequate.  Urine culture also shows no growth. Will continue  cephalexin  antibiotics for total of 5 days.Aaron Aas Hb Sickle Cell Disease with Pain crisis: Continue IV fluid at KVO., continue weight based Dilaudid  PCA at current dose setting, IV Toradol  is contraindicated due to CKD.  Continue oral home pain medications as ordered. Monitor vitals very closely, Re-evaluate pain scale regularly, 2 L of Oxygen by [place]. Patient encouraged to ambulate on the hallway today.  Leukocytosis: Improved. Anemia of Chronic Disease: Hemoglobin has improved at 7.4g/dl after PRBC transfusion. Started on Aranesp on this admission and then biweekly. Chronic pain Syndrome: Continue oral home pain medications as ordered. CKD stage III: Avoid all nephrotoxins.  Continue current management. Homelessness: Patient is currently on housed, will consult TOC and clinical social worker for discharge planning.  Code Status: Full Code Family Communication: N/A Disposition Plan: Discharge planned for tomorrow, 03/17/2024.  Lorel Roes NP  If 7PM-7AM, please contact night-coverage.  03/16/2024, 4:30 PM  LOS: 4 days

## 2024-03-17 ENCOUNTER — Other Ambulatory Visit: Payer: Self-pay | Admitting: Nurse Practitioner

## 2024-03-17 ENCOUNTER — Other Ambulatory Visit (HOSPITAL_COMMUNITY): Payer: Self-pay

## 2024-03-17 LAB — CBC
HCT: 21.6 % — ABNORMAL LOW (ref 39.0–52.0)
Hemoglobin: 7.4 g/dL — ABNORMAL LOW (ref 13.0–17.0)
MCH: 30.3 pg (ref 26.0–34.0)
MCHC: 34.3 g/dL (ref 30.0–36.0)
MCV: 88.5 fL (ref 80.0–100.0)
Platelets: 149 10*3/uL — ABNORMAL LOW (ref 150–400)
RBC: 2.44 MIL/uL — ABNORMAL LOW (ref 4.22–5.81)
RDW: 22.1 % — ABNORMAL HIGH (ref 11.5–15.5)
WBC: 7.4 10*3/uL (ref 4.0–10.5)
nRBC: 4.9 % — ABNORMAL HIGH (ref 0.0–0.2)

## 2024-03-17 MED ORDER — AMOXICILLIN-POT CLAVULANATE 875-125 MG PO TABS
1.0000 | ORAL_TABLET | Freq: Two times a day (BID) | ORAL | 0 refills | Status: DC
  Filled 2024-03-17: qty 10, 5d supply, fill #0

## 2024-03-17 NOTE — TOC Progression Note (Signed)
 Transition of Care [facility]) - Progression Note    Patient Details  Name: Scott Norton MRN: [id number] Date of Birth: 06-24-1966  [facility]) CM/SW Contact  Loreda Rodriguez, RN Phone [phone]  03/17/2024, 12:36 PM  Clinical Narrative:    [facility] acknowledges consult for patient experiencing homelessness. CM at bedside to offer resources. Patient verbalizes understanding. AVS has been updated. Patient states that he does have a vehicle for transportation.         Expected Discharge Plan and Services         Expected Discharge Date: 03/17/24                                     Social Determinants of Health (SDOH) Interventions SDOH Screenings   Food Insecurity: Unknown (03/11/2024)  Housing: High Risk (08/27/2023)  Transportation Needs: No Transportation Needs (08/27/2023)  Utilities: Not At Risk (08/27/2023)  Alcohol  Screen: Low Risk  (03/01/2021)  Depression (PHQ2-9): Low Risk  (03/26/2023)  Tobacco Use: High Risk (03/11/2024)    Readmission Risk Interventions    09/13/2022   11:30 AM  Readmission Risk Prevention Plan  Post Dischage Appt Complete  Medication Screening Complete  Transportation Screening Complete

## 2024-03-17 NOTE — Plan of Care (Signed)
 Went over discharge instructions with pt. He verbalized understanding. Vitals stable, PIVs intact upon removal.   Problem: Fluid Volume: Goal: Hemodynamic stability will improve Outcome: Adequate for Discharge   Problem: Clinical Measurements: Goal: Diagnostic test results will improve Outcome: Adequate for Discharge Goal: Signs and symptoms of infection will decrease Outcome: Adequate for Discharge   Problem: Respiratory: Goal: Ability to maintain adequate ventilation will improve Outcome: Adequate for Discharge   Problem: Education: Goal: Knowledge of General Education information will improve Description: Including pain rating scale, medication(s)/side effects and non-pharmacologic comfort measures Outcome: Adequate for Discharge   Problem: Health Behavior/Discharge Planning: Goal: Ability to manage health-related needs will improve Outcome: Adequate for Discharge   Problem: Clinical Measurements: Goal: Ability to maintain clinical measurements within normal limits will improve Outcome: Adequate for Discharge Goal: Will remain free from infection Outcome: Adequate for Discharge Goal: Diagnostic test results will improve Outcome: Adequate for Discharge Goal: Respiratory complications will improve Outcome: Adequate for Discharge Goal: Cardiovascular complication will be avoided Outcome: Adequate for Discharge   Problem: Activity: Goal: Risk for activity intolerance will decrease Outcome: Adequate for Discharge   Problem: Nutrition: Goal: Adequate nutrition will be maintained Outcome: Adequate for Discharge   Problem: Coping: Goal: Level of anxiety will decrease Outcome: Adequate for Discharge   Problem: Elimination: Goal: Will not experience complications related to bowel motility Outcome: Adequate for Discharge Goal: Will not experience complications related to urinary retention Outcome: Adequate for Discharge   Problem: Pain Managment: Goal: General  experience of comfort will improve and/or be controlled Outcome: Adequate for Discharge   Problem: Safety: Goal: Ability to remain free from injury will improve Outcome: Adequate for Discharge   Problem: Skin Integrity: Goal: Risk for impaired skin integrity will decrease Outcome: Adequate for Discharge   Problem: Education: Goal: Knowledge of vaso-occlusive preventative measures will improve Outcome: Adequate for Discharge Goal: Awareness of infection prevention will improve Outcome: Adequate for Discharge Goal: Awareness of signs and symptoms of anemia will improve Outcome: Adequate for Discharge Goal: Long-term complications will improve Outcome: Adequate for Discharge   Problem: Self-Care: Goal: Ability to incorporate actions that prevent/reduce pain crisis will improve Outcome: Adequate for Discharge   Problem: Bowel/Gastric: Goal: Gut motility will be maintained Outcome: Adequate for Discharge   Problem: Tissue Perfusion: Goal: Complications related to inadequate tissue perfusion will be avoided or minimized Outcome: Adequate for Discharge   Problem: Respiratory: Goal: Pulmonary complications will be avoided or minimized Outcome: Adequate for Discharge Goal: Acute Chest Syndrome will be identified early to prevent complications Outcome: Adequate for Discharge   Problem: Fluid Volume: Goal: Ability to maintain a balanced intake and output will improve Outcome: Adequate for Discharge   Problem: Sensory: Goal: Pain level will decrease with appropriate interventions Outcome: Adequate for Discharge   Problem: Health Behavior: Goal: Postive changes in compliance with treatment and prescription regimens will improve Outcome: Adequate for Discharge   Problem: Education: Goal: Knowledge of vaso-occlusive preventative measures will improve Outcome: Adequate for Discharge Goal: Awareness of infection prevention will improve Outcome: Adequate for Discharge Goal:  Awareness of signs and symptoms of anemia will improve Outcome: Adequate for Discharge Goal: Long-term complications will improve Outcome: Adequate for Discharge   Problem: Self-Care: Goal: Ability to incorporate actions that prevent/reduce pain crisis will improve Outcome: Adequate for Discharge   Problem: Bowel/Gastric: Goal: Gut motility will be maintained Outcome: Adequate for Discharge   Problem: Tissue Perfusion: Goal: Complications related to inadequate tissue perfusion will be avoided or minimized Outcome: Adequate for Discharge  Problem: Respiratory: Goal: Pulmonary complications will be avoided or minimized Outcome: Adequate for Discharge Goal: Acute Chest Syndrome will be identified early to prevent complications Outcome: Adequate for Discharge   Problem: Fluid Volume: Goal: Ability to maintain a balanced intake and output will improve Outcome: Adequate for Discharge   Problem: Sensory: Goal: Pain level will decrease with appropriate interventions Outcome: Adequate for Discharge   Problem: Health Behavior: Goal: Postive changes in compliance with treatment and prescription regimens will improve Outcome: Adequate for Discharge

## 2024-03-17 NOTE — Plan of Care (Signed)
  Problem: Clinical Measurements: Goal: Diagnostic test results will improve Outcome: Progressing   Problem: Respiratory: Goal: Ability to maintain adequate ventilation will improve Outcome: Progressing   Problem: Education: Goal: Knowledge of General Education information will improve Description: Including pain rating scale, medication(s)/side effects and non-pharmacologic comfort measures Outcome: Progressing   Problem: Activity: Goal: Risk for activity intolerance will decrease Outcome: Progressing

## 2024-03-17 NOTE — Discharge Summary (Addendum)
 Physician Discharge Summary  Scott Norton:[id number] DOB: 1966/05/09 DOA: 03/11/2024  PCP: Paseda, Folashade R, FNP  Admit date: 03/11/2024  Discharge date: 03/18/2024  Discharge Diagnoses:  Principal Problem:   Sepsis due to urinary tract infection (HCC) Active Problems:   Sickle cell anemia (HCC)   Chronic kidney disease, stage 3a (HCC)   Abnormal liver CT   Lower urinary tract infectious disease   Discharge Condition: Stable  Disposition:  Pt is discharged home in good condition and is to follow up with Paseda, Folashade R, FNP this week to have labs evaluated. Scott Norton is instructed to increase activity slowly and balance with rest for the next few days, and use prescribed medication to complete treatment of pain  Diet: Regular Wt Readings from Last 3 Encounters:  03/18/24 73.5 kg  03/11/24 74 kg  03/09/24 74.8 kg    History of present illness:  Scott Norton is a 58 year old male with a medical history significant for sickle cell disease, diastolic CHF, history of cocaine abuse, depression with anxiety, and anemia of chronic disease who was admitted for sepsis due to UTI/left pyelonephritis in the setting of sickle cell pain crisis.   ED Course: Patient was treated with IV fluid and IV pain medication with no resolution to symptoms. He is admitted in patient for ongoing pain management, and UTI/sepsis. BP 101/64  Pulse 90  Temp (!) 102.5 F (39.2 C) (Oral)  Resp 18  SpO2 92%  Hospital Course:  Patient was admitted for sickle cell pain crisis and managed appropriately with IVF, IV Dilaudid  via PCA as well as other adjunct therapies per sickle cell pain management protocols. Patient was treated with antibiotics for sepsis and UTI while on admission. Hemoglobin was low below patients baseline, He was transfused with 1 unit PRBC. Patient tolerated transfusion well without complication. Hgb improved and at baseline prior to discharge. He is reporting pain at  baseline today, ambulating without assistance, urinating without burning or pain. Completed TOC and social work consult for future housing needs.  Patient was therefore discharged home today in a hemodynamically stable condition.   Logyn will follow-up with PCP within 1 week of this discharge. Sharad was counseled extensively about nonpharmacologic means of pain management, patient verbalized understanding and was appreciative of  the care received during this admission.   We discussed the need for good hydration, monitoring of hydration status, avoidance of heat, cold, stress, and infection triggers. We discussed the need to be adherent with taking other home medications. Patient was reminded of the need to seek medical attention immediately if any symptom of bleeding, anemia, or infection occurs.  Discharge Exam: Vitals:   03/17/24 1011 03/17/24 1224  BP: (!) 144/75   Pulse: (!) 52   Resp: 16 15  Temp: 98.4 F (36.9 C)   SpO2: 92%    Vitals:   03/17/24 0607 03/17/24 0713 03/17/24 1011 03/17/24 1224  BP: 127/62  (!) 144/75   Pulse: 74  (!) 52   Resp: 14 12 16 15   Temp: 99.4 F (37.4 C)  98.4 F (36.9 C)   TempSrc: Oral  Oral   SpO2: 95% 94% 92%   Weight:      Height:        General appearance : Awake, alert, not in any distress. Speech Clear. Not toxic looking HEENT: Atraumatic and Normocephalic, pupils equally reactive to light and accomodation Neck: Supple, no JVD. No cervical lymphadenopathy.  Chest: Good air entry bilaterally, no added  sounds  CVS: S1 S2 regular, no murmurs.  Abdomen: Bowel sounds present, Non tender and not distended with no gaurding, rigidity or rebound. Extremities: B/L Lower Ext shows no edema, both legs are warm to touch Neurology: Awake alert, and oriented X 3, CN II-XII intact, Non focal Skin: No Rash  Discharge Instructions  Discharge Instructions     Call MD for:  severe uncontrolled pain   Complete by: As directed    Call MD for:   temperature >100.4   Complete by: As directed    Diet - low sodium heart healthy   Complete by: As directed    Increase activity slowly   Complete by: As directed       Allergies as of 03/17/2024       Reactions   Aspirin Other (See Comments)   Increased heart rate   Tape Rash   Tramadol  Hives, Itching        Medication List     TAKE these medications    Vitamin D  (Ergocalciferol ) 1.25 MG (50000 UNIT) Caps capsule Commonly known as: DRISDOL  Take 1 capsule (50,000 Units total) by mouth every 7 (seven) days.        The results of significant diagnostics from this hospitalization (including imaging, microbiology, ancillary and laboratory) are listed below for reference.    Significant Diagnostic Studies: DG Chest 2 View Result Date: 03/13/2024 CLINICAL DATA:  Hypoxia. EXAM: CHEST - 2 VIEW COMPARISON:  Chest x-ray November 5, 24. FINDINGS: Similar linear opacities at the lung bases. No new confluent consolidation. No visible pleural effusions or pneumothorax. Similar cardiac silhouette. IMPRESSION: Streaky bibasilar opacities, most likely atelectasis/scarring. Electronically Signed   By: Stevenson Elbe M.D.   On: 03/13/2024 15:16   CT Renal Stone Study Result Date: 03/11/2024 CLINICAL DATA:  Abdomen flank pain EXAM: CT ABDOMEN AND PELVIS WITHOUT CONTRAST TECHNIQUE: Multidetector CT imaging of the abdomen and pelvis was performed following the standard protocol without IV contrast. RADIATION DOSE REDUCTION: This exam was performed according to the departmental dose-optimization program which includes automated exposure control, adjustment of the mA and/or kV according to patient size and/or use of iterative reconstruction technique. COMPARISON:  Chest CT 06/10/2021 FINDINGS: Lower chest: Lung bases demonstrate no acute airspace disease. Differential blood cardiac pool suggesting anemia. Cardiomegaly. Trace pericardial effusion. Probable left ventricular papillary muscle  calcifications, no change. Hepatobiliary: Enlarged appearing left hepatic lobe. Question surface nodularity of liver. Lobulated liver contour mass at the posteroinferior right hepatic lobe, series 2, image 29 and coronal series 5 image 84 Pancreas: Unremarkable. No pancreatic ductal dilatation or surrounding inflammatory changes. Spleen: Splenic granuloma Adrenals/Urinary Tract: Adrenal glands are within normal limits. Kidneys show no hydronephrosis. Lobulated contour of the right greater than left kidney potentially due to cortical scarring. The urinary bladder is nearly empty Stomach/Bowel: Stomach nonenlarged. No dilated small bowel. No acute bowel wall thickening. Vascular/Lymphatic: Aortic atherosclerosis. No enlarged abdominal or pelvic lymph nodes. Reproductive: Prostate is unremarkable. Other: Negative for pelvic effusion or free air. Musculoskeletal: No acute or suspicious osseous abnormality. IMPRESSION: 1. Negative for hydronephrosis or ureteral stone. Lobulated contour of the right greater than left kidney potentially due to cortical scarring. 2. Enlarged appearing left hepatic lobe. Question surface nodularity of liver, correlate for cirrhosis risk factors. Focal lobulated liver contour versus a mass at the posteroinferior right hepatic lobe, When the patient is clinically stable and able to follow directions and hold their breath (preferably as an outpatient) further evaluation with dedicated abdominal MRI should be considered.  3. Cardiomegaly. Trace pericardial effusion. 4. Aortic atherosclerosis. Aortic Atherosclerosis (ICD10-I70.0). Electronically Signed   By: Esmeralda Hedge M.D.   On: 03/11/2024 21:18    Microbiology: Recent Results (from the past 240 hours)  Culture, blood (Routine X 2) w Reflex to ID Panel     Status: None   Collection Time: 03/11/24  9:20 PM   Specimen: BLOOD  Result Value Ref Range Status   Specimen Description   Final    BLOOD LEFT ANTECUBITAL Performed at [facility], [address], [place], [place] [address]    Special Requests   Final    BOTTLES DRAWN AEROBIC AND ANAEROBIC Blood Culture results may not be optimal due to an inadequate volume of blood received in culture bottles Performed at [facility], [address], [place], [place] [address]    Culture   Final    NO GROWTH 5 DAYS Performed at [facility], [address] [place], [place] [address]    Report Status 03/16/2024 FINAL  Final  Culture, blood (Routine X 2) w Reflex to ID Panel     Status: None   Collection Time: 03/11/24  9:25 PM   Specimen: BLOOD  Result Value Ref Range Status   Specimen Description   Final    BLOOD RIGHT ANTECUBITAL Performed at [facility], [address] [place], [place] [address]    Special Requests   Final    BOTTLES DRAWN AEROBIC AND ANAEROBIC Blood Culture results may not be optimal due to an inadequate volume of blood received in culture bottles Performed at [name], [address], [place], [place] [address]    Culture   Final    NO GROWTH 5 DAYS Performed at [facility], [address] [place], [place] [address]    Report Status 03/16/2024 FINAL  Final  Urine Culture (for pregnant, neutropenic or urologic patients or patients with an indwelling urinary catheter)     Status: None   Collection Time: 03/11/24 11:10 PM   Specimen: Urine, Clean Catch  Result Value Ref Range Status   Specimen Description   Final    URINE, CLEAN CATCH Performed at [facility], [address], [place], [place] [address]    Special Requests   Final    NONE Performed at [place] [facility], [address], [place], [place] [address]    Culture   Final    NO GROWTH Performed at [facility], [address] [place], [place] [address]    Report Status 03/13/2024 FINAL  Final  Resp panel by RT-PCR (RSV, Flu A&B, Covid) Anterior  Nasal Swab     Status: None   Collection Time: 03/11/24 11:14 PM   Specimen: Anterior Nasal Swab  Result Value Ref Range Status   SARS Coronavirus 2 by RT PCR NEGATIVE NEGATIVE Final    Comment: (NOTE) SARS-CoV-2 target nucleic acids are NOT DETECTED.  The SARS-CoV-2 RNA is generally detectable in upper respiratory specimens during the acute phase of infection. The lowest concentration of SARS-CoV-2 viral copies this assay can detect is 138 copies/mL. A negative result does not preclude SARS-Cov-2 infection and should not be used as the sole basis for treatment or other patient management decisions. A negative result may occur with  improper specimen collection/handling, submission of specimen other than nasopharyngeal swab, presence of viral mutation(s) within the areas targeted by this assay, and inadequate number of viral  copies(<138 copies/mL). A negative result must be combined with clinical observations, patient history, and epidemiological information. The expected result is Negative.  Fact Sheet for Patients:  BloggerCourse.com  Fact Sheet for Healthcare Providers:  SeriousBroker.it  This test is no t yet approved or cleared by the [place]  FDA and  has been authorized for detection and/or diagnosis of SARS-CoV-2 by FDA under an Emergency Use Authorization (EUA). This EUA will remain  in effect (meaning this test can be used) for the duration of the COVID-19 declaration under Section 564(b)(1) of the Act, 21 U.S.C.section 360bbb-3(b)(1), unless the authorization is terminated  or revoked sooner.       Influenza A by PCR NEGATIVE NEGATIVE Final   Influenza B by PCR NEGATIVE NEGATIVE Final    Comment: (NOTE) The Xpert Xpress SARS-CoV-2/FLU/RSV plus assay is intended as an aid in the diagnosis of influenza from Nasopharyngeal swab specimens and should not be used as a sole basis for treatment. Nasal washings  and aspirates are unacceptable for Xpert Xpress SARS-CoV-2/FLU/RSV testing.  Fact Sheet for Patients: BloggerCourse.com  Fact Sheet for Healthcare Providers: SeriousBroker.it  This test is not yet approved or cleared by the [place]  FDA and has been authorized for detection and/or diagnosis of SARS-CoV-2 by FDA under an Emergency Use Authorization (EUA). This EUA will remain in effect (meaning this test can be used) for the duration of the COVID-19 declaration under Section 564(b)(1) of the Act, 21 U.S.C. section 360bbb-3(b)(1), unless the authorization is terminated or revoked.     Resp Syncytial Virus by PCR NEGATIVE NEGATIVE Final    Comment: (NOTE) Fact Sheet for Patients: BloggerCourse.com  Fact Sheet for Healthcare Providers: SeriousBroker.it  This test is not yet approved or cleared by the [place]  FDA and has been authorized for detection and/or diagnosis of SARS-CoV-2 by FDA under an Emergency Use Authorization (EUA). This EUA will remain in effect (meaning this test can be used) for the duration of the COVID-19 declaration under Section 564(b)(1) of the Act, 21 U.S.C. section 360bbb-3(b)(1), unless the authorization is terminated or revoked.  Performed at [facility], [address], [place], [place] [address]      Labs: Basic Metabolic Panel: Recent Labs  Lab 03/12/24 0736 03/14/24 0751 03/15/24 0707 03/16/24 0548  NA 136 137 139 137  K 5.1 4.6 4.5 5.1  CL 110 111 112* 109  CO2 19* 21* 20* 20*  GLUCOSE 95 103* 89 92  BUN 25* 34* 37* 32*  CREATININE 1.82* 1.95* 1.69* 1.37*  CALCIUM  8.6* 8.8* 8.9 9.0   Liver Function Tests: Recent Labs  Lab 03/12/24 0736  AST 40  ALT 23  ALKPHOS 192*  BILITOT 4.0*  PROT 6.3*  ALBUMIN 3.3*   No results for input(s): "LIPASE", "AMYLASE" in the last 168 hours. No results for  input(s): "AMMONIA" in the last 168 hours. CBC: Recent Labs  Lab 03/12/24 0736 03/15/24 0707 03/16/24 0548 03/17/24 0949  WBC 12.3* 8.3 7.7 7.4  NEUTROABS  --  3.9  --   --   HGB 7.2* 5.2* 7.4* 7.4*  HCT 22.6* 14.7* 21.2* 21.6*  MCV 95.0 83.5 86.9 88.5  PLT 151 117* 121* 149*   Cardiac Enzymes: No results for input(s): "CKTOTAL", "CKMB", "CKMBINDEX", "TROPONINI" in the last 168 hours. BNP: Invalid input(s): "POCBNP" CBG: No results for input(s): "GLUCAP" in the last 168 hours.  Time coordinating discharge: 50 minutes  Signed:  Lorel Roes NP   03/18/2024, 10:46 PM

## 2024-03-17 NOTE — Progress Notes (Signed)
 RX sent to pharmacy

## 2024-03-18 ENCOUNTER — Other Ambulatory Visit: Payer: Self-pay | Admitting: Nurse Practitioner

## 2024-03-18 ENCOUNTER — Other Ambulatory Visit (HOSPITAL_COMMUNITY): Payer: Self-pay

## 2024-03-18 ENCOUNTER — Telehealth: Payer: Self-pay | Admitting: *Deleted

## 2024-03-18 ENCOUNTER — Ambulatory Visit: Payer: Self-pay | Admitting: Nurse Practitioner

## 2024-03-18 ENCOUNTER — Encounter: Payer: Self-pay | Admitting: Nurse Practitioner

## 2024-03-18 VITALS — BP 132/52 | HR 71 | Temp 97.0°F | Wt 162.0 lb

## 2024-03-18 DIAGNOSIS — A419 Sepsis, unspecified organism: Secondary | ICD-10-CM | POA: Diagnosis not present

## 2024-03-18 DIAGNOSIS — D571 Sickle-cell disease without crisis: Secondary | ICD-10-CM

## 2024-03-18 DIAGNOSIS — N1831 Chronic kidney disease, stage 3a: Secondary | ICD-10-CM

## 2024-03-18 DIAGNOSIS — Z5900 Homelessness unspecified: Secondary | ICD-10-CM

## 2024-03-18 DIAGNOSIS — R932 Abnormal findings on diagnostic imaging of liver and biliary tract: Secondary | ICD-10-CM

## 2024-03-18 DIAGNOSIS — Z1211 Encounter for screening for malignant neoplasm of colon: Secondary | ICD-10-CM | POA: Diagnosis not present

## 2024-03-18 DIAGNOSIS — G894 Chronic pain syndrome: Secondary | ICD-10-CM

## 2024-03-18 DIAGNOSIS — N390 Urinary tract infection, site not specified: Secondary | ICD-10-CM

## 2024-03-18 DIAGNOSIS — Z09 Encounter for follow-up examination after completed treatment for conditions other than malignant neoplasm: Secondary | ICD-10-CM

## 2024-03-18 MED ORDER — OXYCODONE-ACETAMINOPHEN 10-325 MG PO TABS
1.0000 | ORAL_TABLET | Freq: Four times a day (QID) | ORAL | 0 refills | Status: DC | PRN
  Filled 2024-03-20: qty 60, 15d supply, fill #0
  Filled ????-??-??: fill #0

## 2024-03-18 NOTE — Assessment & Plan Note (Signed)
 Declined referral to social work

## 2024-03-18 NOTE — Assessment & Plan Note (Addendum)
 Lab Results  Component Value Date   NA 137 03/16/2024   K 5.1 03/16/2024   CO2 20 (L) 03/16/2024   GLUCOSE 92 03/16/2024   BUN 32 (H) 03/16/2024   CREATININE 1.37 (H) 03/16/2024   CALCIUM  9.0 03/16/2024   EGFR 60 12/18/2023   GFRNONAA >60 03/16/2024  Avoid NSAIDs and other nephrotoxic agents Drink at least 64 ounces of water daily to maintain hydration Ibuprofen  discontinued

## 2024-03-18 NOTE — Assessment & Plan Note (Signed)
 Cologuard ordered

## 2024-03-18 NOTE — Assessment & Plan Note (Signed)
 We discussed the need for good hydration, monitoring of hydration status, avoidance of heat, cold, stress, and infection triggers. We discussed the need to be adherent with taking  home medications. Patient was reminded of the need to seek medical attention immediately if any symptom of bleeding, anemia, or infection occurs.   Continue Percocet 10 325 mg 1 tablet every 6 hours as needed

## 2024-03-18 NOTE — Progress Notes (Signed)
 Established Patient Office Visit  Subjective:  Patient ID: Scott Norton, male    DOB: 1966/05/22  Age: 58 y.o. MRN: [id number]  CC:  Chief Complaint  Patient presents with   Sickle Cell Anemia    HPI Scott Norton is a 58 y.o. male  has a past medical history of AKI (acute kidney injury) (HCC) (02/08/2021), Anemia, Cocaine abuse (HCC) (02/08/2021), Depression, Heart murmur, Seasonal allergies, Sickle cell anemia (HCC), Suicidal ideation, and Tobacco use disorder (11/06/2023).  Patient presents for hospitalization follow-up.    Sepsis due to UTI he was admission at [facility] hospital from 03/12/2024 to 03/17/2024 for sepsis due to UTI.  Was treated with IV ceftriaxone  while at the hospital discharged on Augmentin  and Keflex .  He has not picked up prescription for Augmentin  but he is taking Keflex .  He currently denies fever, chills, dysuria, still has left flank pain that is better, states that his urine color is now light yellow  Sickle cell disease.  On Percocet 10 325 mg every 6 hours as needed, ibuprofen  600 mg 3 times daily.  He denies sickle cell pain at this time.  He received 2 units PRBCs while at the hospital.   Abnormal liver appearance on imaging.  CT showed questionable surface nodularity of the liver as well as focal lobulated liver contour versus mass at the posterior inferior right hepatic lobe, recommended outpatient abdominal MRI for further evaluation.  MRI ordered today.  Patient currently denies abdominal pain nausea vomiting  Homelessness.  Due to his girlfriend losing her job recently, she now has a new job, they plan renting an apartment once she gets her paycheck. ,he is currently staying in an hotel room,   He declined referral to Child psychotherapist   Past Medical History:  Diagnosis Date   AKI (acute kidney injury) (HCC) 02/08/2021   Anemia    Cocaine abuse (HCC) 02/08/2021   Depression    Heart murmur    Seasonal allergies    wool & grass   Sickle cell  anemia (HCC)    Suicidal ideation    Tobacco use disorder 11/06/2023    Past Surgical History:  Procedure Laterality Date   NO PAST SURGERIES     ROOT CANAL      Family History  Problem Relation Age of Onset   Alcohol  abuse Mother    Sickle cell trait Mother    Hypertension Mother    Alcohol  abuse Father    Sickle cell trait Father    Early death Father        due to alcoholism   Hypertension Father    Hypertension Sister    Hypertension Brother    Hypertension Maternal Grandmother    Hypertension Maternal Grandfather    Hypertension Paternal Grandmother    Hypertension Paternal Grandfather     Social History   Socioeconomic History   Marital status: Significant Other    Spouse name: Not on file   Number of children: 3   Years of education: Not on file   Highest education level: Not on file  Occupational History   Not on file  Tobacco Use   Smoking status: Some Days    Current packs/day: 0.50    Types: Cigars, Cigarettes   Smokeless tobacco: Never   Tobacco comments:    10 cigars per week.  Vaping Use   Vaping status: Never Used  Substance and Sexual Activity   Alcohol  use: Not Currently   Drug use: Not Currently  Types: Marijuana, Cocaine   Sexual activity: Yes  Other Topics Concern   Not on file  Social History Narrative   Lives with Friends in [place]. Working on housing for himself   On disability for his SS disease      No children, no stable relationship   Social Drivers of Corporate investment banker Strain: Not on file  Food Insecurity: No Food Insecurity (03/18/2024)   Hunger Vital Sign    Worried About Running Out of Food in the Last Year: Never true    Ran Out of Food in the Last Year: Never true  Transportation Needs: No Transportation Needs (03/18/2024)   PRAPARE - Administrator, Civil Service (Medical): No    Lack of Transportation (Non-Medical): No  Physical Activity: Not on file  Stress: Not on file  Social Connections:  Not on file  Intimate Partner Violence: Not At Risk (03/18/2024)   Humiliation, Afraid, Rape, and Kick questionnaire    Fear of Current or Ex-Partner: No    Emotionally Abused: No    Physically Abused: No    Sexually Abused: No    Outpatient Medications Prior to Visit  Medication Sig Dispense Refill   amoxicillin -clavulanate (AUGMENTIN ) 875-125 MG tablet Take 1 tablet by mouth 2 (two) times daily. 10 tablet 0   ibuprofen  (ADVIL ) 800 MG tablet Take 1 tablet by mouth every 8 hours as needed. 60 tablet 1   oxyCODONE -acetaminophen  (PERCOCET) 10-325 MG tablet Take 1 tablet by mouth every 6 (six) hours as needed for up to 15 days for pain. 60 tablet 0   Vitamin D , Ergocalciferol , (DRISDOL ) 1.25 MG (50000 UNIT) CAPS capsule Take 1 capsule (50,000 Units total) by mouth every 7 (seven) days. 12 capsule 1   No facility-administered medications prior to visit.    Allergies  Allergen Reactions   Aspirin Other (See Comments)    Increased heart rate   Tape Rash   Tramadol  Hives and Itching    ROS Review of Systems  Constitutional:  Negative for appetite change, chills, fatigue and fever.  HENT:  Negative for congestion, postnasal drip, rhinorrhea and sneezing.   Respiratory:  Negative for cough, shortness of breath and wheezing.   Cardiovascular:  Negative for chest pain, palpitations and leg swelling.  Gastrointestinal:  Negative for abdominal pain, constipation, nausea and vomiting.  Genitourinary:  Negative for difficulty urinating, dysuria, flank pain and frequency.  Musculoskeletal:  Negative for arthralgias, back pain, joint swelling and myalgias.  Skin:  Negative for color change, pallor, rash and wound.  Neurological:  Negative for dizziness, facial asymmetry, weakness, numbness and headaches.  Psychiatric/Behavioral:  Negative for behavioral problems, confusion, self-injury and suicidal ideas.       Objective:     Physical Exam Vitals and nursing note reviewed.   Constitutional:      General: He is not in acute distress.    Appearance: Normal appearance. He is obese. He is not ill-appearing, toxic-appearing or diaphoretic.  Eyes:     General: Scleral icterus present.        Right eye: No discharge.        Left eye: No discharge.     Extraocular Movements: Extraocular movements intact.  Cardiovascular:     Rate and Rhythm: Normal rate and regular rhythm.     Pulses: Normal pulses.     Heart sounds: Normal heart sounds. No murmur heard.    No friction rub. No gallop.  Pulmonary:     Effort: Pulmonary  effort is normal. No respiratory distress.     Breath sounds: Normal breath sounds. No stridor. No wheezing, rhonchi or rales.  Chest:     Chest wall: No tenderness.  Abdominal:     General: There is no distension.     Palpations: Abdomen is soft.     Tenderness: There is no abdominal tenderness. There is left CVA tenderness. There is no right CVA tenderness or guarding.  Musculoskeletal:        General: No swelling, tenderness, deformity or signs of injury.     Right lower leg: No edema.     Left lower leg: No edema.  Skin:    General: Skin is warm and dry.     Capillary Refill: Capillary refill takes less than 2 seconds.     Coloration: Skin is not jaundiced or pale.     Findings: No bruising, erythema or lesion.  Neurological:     Mental Status: He is alert and oriented to person, place, and time.     Motor: No weakness.     Coordination: Coordination normal.     Gait: Gait normal.  Psychiatric:        Mood and Affect: Mood normal.        Behavior: Behavior normal.        Thought Content: Thought content normal.        Judgment: Judgment normal.     BP (!) 132/52   Pulse 71   Temp (!) 97 F (36.1 C)   Wt 162 lb (73.5 kg)   SpO2 95%   BMI 20.25 kg/m  Wt Readings from Last 3 Encounters:  03/18/24 162 lb (73.5 kg)  03/11/24 163 lb 2.3 oz (74 kg)  03/09/24 165 lb (74.8 kg)    Lab Results  Component Value Date   TSH 1.730  03/01/2021   Lab Results  Component Value Date   WBC 7.4 03/17/2024   HGB 7.4 (L) 03/17/2024   HCT 21.6 (L) 03/17/2024   MCV 88.5 03/17/2024   PLT 149 (L) 03/17/2024   Lab Results  Component Value Date   NA 137 03/16/2024   K 5.1 03/16/2024   CO2 20 (L) 03/16/2024   GLUCOSE 92 03/16/2024   BUN 32 (H) 03/16/2024   CREATININE 1.37 (H) 03/16/2024   BILITOT 4.0 (H) 03/12/2024   ALKPHOS 192 (H) 03/12/2024   AST 40 03/12/2024   ALT 23 03/12/2024   PROT 6.3 (L) 03/12/2024   ALBUMIN 3.3 (L) 03/12/2024   CALCIUM  9.0 03/16/2024   ANIONGAP 8 03/16/2024   EGFR 60 12/18/2023   Lab Results  Component Value Date   CHOL 127 03/01/2021   Lab Results  Component Value Date   HDL 43 03/01/2021   Lab Results  Component Value Date   LDLCALC 66 03/01/2021   Lab Results  Component Value Date   TRIG 89 03/01/2021   Lab Results  Component Value Date   CHOLHDL 3.0 03/01/2021   Lab Results  Component Value Date   HGBA1C <4.2 (L) 03/01/2021      Assessment & Plan:   Problem List Items Addressed This Visit       Genitourinary   Chronic kidney disease, stage 3a (HCC)   Lab Results  Component Value Date   NA 137 03/16/2024   K 5.1 03/16/2024   CO2 20 (L) 03/16/2024   GLUCOSE 92 03/16/2024   BUN 32 (H) 03/16/2024   CREATININE 1.37 (H) 03/16/2024   CALCIUM  9.0 03/16/2024  EGFR 60 12/18/2023   GFRNONAA >60 03/16/2024  Avoid NSAIDs and other nephrotoxic agents Drink at least 64 ounces of water daily to maintain hydration Ibuprofen  discontinued        Other   Sickle cell anemia (HCC)   We discussed the need for good hydration, monitoring of hydration status, avoidance of heat, cold, stress, and infection triggers. We discussed the need to be adherent with taking  home medications. Patient was reminded of the need to seek medical attention immediately if any symptom of bleeding, anemia, or infection occurs.   Continue Percocet 10 325 mg 1 tablet every 6 hours as needed       Relevant Medications   oxyCODONE -acetaminophen  (PERCOCET) 10-325 MG tablet (Start on 03/20/2024)   Other Relevant Orders   [id number] 11+Oxyco+Alc+Crt-Bund   CBC   Basic Metabolic Panel   Homelessness   Declined referral to social work      Chronic pain   Relevant Medications   oxyCODONE -acetaminophen  (PERCOCET) 10-325 MG tablet (Start on 03/20/2024)   Sepsis due to urinary tract infection (HCC) - Primary   Continue Keflex  500mg  4 times daily, encouraged to pick up prescription for Augmentin  and take twice daily as ordered Drink at 64 ounces of water daily to maintain hydration      Abnormal liver CT   CT showed question of surface nodularity of the liver as well as focal lobulated liver contour versus mass at the posteroinferior right hepatic lobe. Recommend consideration of outpatient abdominal MRI for further evaluation.       Relevant Orders   MR ABDOMEN WWO CONTRAST   Screening for colon cancer   Cologuard ordered      Relevant Orders   Cologuard   Hospital discharge follow-up   Hospital chart reviewed, including discharge summary Medications reconciled and reviewed with the patient in detail        Meds ordered this encounter  Medications   oxyCODONE -acetaminophen  (PERCOCET) 10-325 MG tablet    Sig: Take 1 tablet by mouth every 6 (six) hours as needed for up to 15 days for pain.    Dispense:  60 tablet    Refill:  0    Follow-up: Return in about 4 weeks (around 04/15/2024) for CPE.    Vonte Rossin R Gilliam Hawkes, FNP

## 2024-03-18 NOTE — Patient Instructions (Addendum)
 Please stop taking ibuprofen  due your decreased  kidney functions , drink at least 64 ounces of water daily  Please completed full courses of antibiotics ordered     It is important that you exercise regularly at least 30 minutes 5 times a week as tolerated  Think about what you will eat, plan ahead. Choose " clean, green, fresh or frozen" over canned, processed or packaged foods which are more sugary, salty and fatty. 70 to 75% of food eaten should be vegetables and fruit. Three meals at set times with snacks allowed between meals, but they must be fruit or vegetables. Aim to eat over a 12 hour period , example 7 am to 7 pm, and STOP after  your last meal of the day. Drink water,generally about 64 ounces per day, no other drink is as healthy. Fruit juice is best enjoyed in a healthy way, by EATING the fruit.  Thanks for choosing [facility] we consider it a privelige to serve you.

## 2024-03-18 NOTE — Telephone Encounter (Signed)
 Please advise [facility]

## 2024-03-18 NOTE — Assessment & Plan Note (Signed)
 CT showed question of surface nodularity of the liver as well as focal lobulated liver contour versus mass at the posteroinferior right hepatic lobe. Recommend consideration of outpatient abdominal MRI for further evaluation.

## 2024-03-18 NOTE — Transitions of Care (Post Inpatient/ED Visit) (Signed)
 03/18/2024  Name: Scott Norton MRN: [id number] DOB: [birth year]  Today's TOC FU Call Status: Today's TOC FU Call Status:: Successful TOC FU Call Completed TOC FU Call Complete Date: 03/18/24 Patient's Name and Date of Birth confirmed.  Transition Care Management Follow-up Telephone Call Discharge Facility: [facility]) Type of Discharge: Inpatient Admission Primary Inpatient Discharge Diagnosis:: Sepsis r/t UTI How have you been since you were released from the hospital?: Better (eating, drinking well, trying to stay well hydrated with water, ambulating without difficulty) Any questions or concerns?: No  Items Reviewed: Did you receive and understand the discharge instructions provided?: Yes Medications obtained,verified, and reconciled?: Yes (Medications Reviewed) Any new allergies since your discharge?: No Dietary orders reviewed?: Yes Type of Diet Ordered:: Heart healthy   low sodium Do you have support at home?: Yes People in Home [RPT]: significant other Name of Support/Comfort Primary Source: pt states he has support, does not share name  Medications Reviewed Today: Medications Reviewed Today     Reviewed by Daralyn Earl, RN (Registered Nurse) on 03/18/24 at 1440  Med List Status: <None>   Medication Order Taking? Sig Documenting Provider Last Dose Status Informant  amoxicillin -clavulanate (AUGMENTIN ) 875-125 MG tablet [id number] Yes Take 1 tablet by mouth 2 (two) times daily. Lorel Roes, NP Taking Active   cephALEXin  (KEFLEX ) 500 MG capsule [id number] Yes Take 500 mg by mouth 4 (four) times daily. [provider] Taking Active   oxyCODONE -acetaminophen  (PERCOCET) 10-325 MG tablet [id number] Yes Take 1 tablet by mouth every 6 (six) hours as needed for up to 15 days for pain. Paseda, Folashade R, FNP Taking Active Self  Vitamin D , Ergocalciferol , (DRISDOL ) 1.25 MG (50000 UNIT) CAPS capsule [id number] Yes Take 1 capsule (50,000 Units total) by mouth every 7  (seven) days. Paseda, Folashade R, FNP Taking Active Self           Med Note Nolan Battle, MUHAMMAD I   Wed Mar 11, 2024 10:21 PM) Need Refills            Home Care and Equipment/Supplies: Were Home Health Services Ordered?: No Any new equipment or medical supplies ordered?: No  Functional Questionnaire: Do you need assistance with bathing/showering or dressing?: No Do you need assistance with meal preparation?: No Do you need assistance with eating?: No Do you have difficulty maintaining continence: No Do you need assistance with getting out of bed/getting out of a chair/moving?: No Do you have difficulty managing or taking your medications?: No  Follow up appointments reviewed: PCP Follow-up appointment confirmed?: Yes Date of PCP follow-up appointment?: 03/18/24 Follow-up Provider: Folashade [facility] Follow-up appointment confirmed?: NA Do you need transportation to your follow-up appointment?: No Do you understand care options if your condition(s) worsen?: Yes-patient verbalized understanding  SDOH Interventions Today    Flowsheet Row Most Recent Value  SDOH Interventions   Food Insecurity Interventions Intervention Not Indicated  [pt states he has [place] [facility] Card]  Housing Interventions AMB Referral  Engineer, petroleum referral]  Transportation Interventions Intervention Not Indicated  Utilities Interventions Intervention Not Indicated       Goals Addressed             This Visit's Progress    VBCI Transitions of Care (TOC) Care Plan       Problems:  Recent Hospitalization for treatment of Sepsis r/t UTI SDOH barrier: pt is living in his car, states he is supposed to go back to the hotel tomorrow 03/19/24  Goal:  Over the  next 30 days, the patient will not experience hospital readmission  Interventions:   Evaluation of current treatment plan related to Sepsis r/t UTI, Housing barriers and Lacks knowledge of community resource: for  housing options, self-management and patient's adherence to plan as established by provider. Discussed plans with patient for ongoing care management follow up and provided patient with direct contact information for care management team Evaluation of current treatment plan related to urinary tract infection and patient's adherence to plan as established by provider Advised patient to report any worsening urinary symptoms to primary care provider Reviewed medications with patient and discussed importance of taking as prescribed, taking antibiotic just as prescribed Reviewed scheduled/upcoming provider appointments including primary care provider Social Work referral for homelessness (living in car) Discussed plans with patient for ongoing care management follow up and provided patient with direct contact information for care management team Screening for signs and symptoms of depression related to chronic disease state  Assessed social determinant of health barriers Reviewed signs/ symptoms urinary tract infection, importance of drinking adequate water  Patient Self Care Activities:  Attend all scheduled provider appointments Attend church or other social activities Call pharmacy for medication refills 3-7 days in advance of running out of medications Call provider office for new concerns or questions  Notify RN Care Manager of TOC call rescheduling needs Participate in Transition of Care Program/Attend TOC scheduled calls Perform all self care activities independently  Take medications as prescribed   Work with the social worker to address care coordination needs and will continue to work with the clinical team to address health care and disease management related needs Please report any worsening urinary symptoms to your doctor Drink adequate fluids, preferably water Social worker will be contacting you about housing  Plan:  Telephone follow up appointment with care management team  member scheduled for:  03/26/24 @ 1015 The patient has been provided with contact information for the care management team and has been advised to call with any health related questions or concerns.          Cecilie Coffee [facility], BSN RN Care Manager/ Transition of Care [place]/ [facility] [phone]

## 2024-03-18 NOTE — Assessment & Plan Note (Signed)
 Continue Keflex  500mg  4 times daily, encouraged to pick up prescription for Augmentin  and take twice daily as ordered Drink at 64 ounces of water daily to maintain hydration

## 2024-03-18 NOTE — Assessment & Plan Note (Signed)
 Hospital chart reviewed, including discharge summary Medications reconciled and reviewed with the patient in detail

## 2024-03-20 ENCOUNTER — Other Ambulatory Visit (HOSPITAL_COMMUNITY): Payer: Self-pay

## 2024-03-20 LAB — DRUG SCREEN 764883 11+OXYCO+ALC+CRT-BUND

## 2024-03-21 LAB — DRUG SCREEN 764883 11+OXYCO+ALC+CRT-BUND
Amphetamines, Urine: NEGATIVE ng/mL
BENZODIAZ UR QL: NEGATIVE ng/mL
Barbiturate screen, urine: NEGATIVE ng/mL
Cannabinoid Quant, Ur: NEGATIVE ng/mL
Cocaine (Metab.): NEGATIVE ng/mL
Creatinine, Urine: 57.9 mg/dL (ref 20.0–300.0)
Ethanol, Urine: NEGATIVE %
Meperidine: NEGATIVE ng/mL
Methadone Screen, Urine: NEGATIVE ng/mL
Nitrite Urine, Quantitative: NEGATIVE ug/mL
OPIATE SCREEN URINE: NEGATIVE ng/mL
PCP Quant, Ur: NEGATIVE ng/mL
Propoxyphene: NEGATIVE ng/mL
Tramadol: NEGATIVE ng/mL
pH, Urine: 5.6 (ref 4.5–8.9)

## 2024-03-21 LAB — OXYCODONE/OXYMORPHONE, CONFIRM
OXYCODONE/OXYMORPH: POSITIVE — AB
OXYCODONE: NEGATIVE
OXYMORPHONE (GC/MS): 401 ng/mL
OXYMORPHONE: POSITIVE — AB

## 2024-03-25 ENCOUNTER — Telehealth: Payer: Self-pay | Admitting: *Deleted

## 2024-03-25 ENCOUNTER — Other Ambulatory Visit: Payer: Self-pay

## 2024-03-25 DIAGNOSIS — D571 Sickle-cell disease without crisis: Secondary | ICD-10-CM

## 2024-03-25 NOTE — Progress Notes (Signed)
 Complex Care Management Note Care Guide Note  03/25/2024 Name: KATHERINE SYME MRN: [id number] DOB: 1966-09-13   Complex Care Management Outreach Attempts: An unsuccessful telephone outreach was attempted today to offer the patient information about available complex care management services.  Follow Up Plan:  Additional outreach attempts will be made to offer the patient complex care management information and services.   Encounter Outcome:  No Answer  Barnie Bora  [facility]  [facility], [facility] Guide  Direct Dial: [phone]  Fax [phone]

## 2024-03-26 ENCOUNTER — Other Ambulatory Visit: Payer: Self-pay | Admitting: *Deleted

## 2024-03-26 LAB — CBC
Hematocrit: 29.5 % — ABNORMAL LOW (ref 37.5–51.0)
Hemoglobin: 9.1 g/dL — ABNORMAL LOW (ref 13.0–17.7)
MCH: 29.8 pg (ref 26.6–33.0)
MCHC: 30.8 g/dL — ABNORMAL LOW (ref 31.5–35.7)
MCV: 97 fL (ref 79–97)
NRBC: 3 % — ABNORMAL HIGH (ref 0–0)
Platelets: 294 10*3/uL (ref 150–450)
RBC: 3.05 x10E6/uL — ABNORMAL LOW (ref 4.14–5.80)
RDW: 19.3 % — ABNORMAL HIGH (ref 11.6–15.4)
WBC: 6.3 10*3/uL (ref 3.4–10.8)

## 2024-03-26 LAB — BASIC METABOLIC PANEL WITH GFR
BUN/Creatinine Ratio: 14 (ref 9–20)
BUN: 19 mg/dL (ref 6–24)
CO2: 18 mmol/L — ABNORMAL LOW (ref 20–29)
Calcium: 9.5 mg/dL (ref 8.7–10.2)
Chloride: 107 mmol/L — ABNORMAL HIGH (ref 96–106)
Creatinine, Ser: 1.34 mg/dL — ABNORMAL HIGH (ref 0.76–1.27)
Glucose: 82 mg/dL (ref 70–99)
Potassium: 4.6 mmol/L (ref 3.5–5.2)
Sodium: 140 mmol/L (ref 134–144)
eGFR: 62 mL/min/{1.73_m2} (ref >59)

## 2024-03-26 NOTE — Patient Outreach (Signed)
  Transition of Care week 2  Visit Note  03/26/2024  Name: Scott Norton MRN: [id number]          DOB: 1966/09/18  Situation: Patient enrolled in [facility] 30-day program. Visit completed with patient by telephone.   Background:    Past Medical History:  Diagnosis Date   AKI (acute kidney injury) (HCC) 02/08/2021   Anemia    Cocaine abuse (HCC) 02/08/2021   Depression    Heart murmur    Seasonal allergies    wool & grass   Sickle cell anemia (HCC)    Suicidal ideation    Tobacco use disorder 11/06/2023    Assessment: Patient Reported Symptoms: Cognitive Cognitive Status: Able to follow simple commands, Alert and oriented to person, place, and time, Normal speech and language skills      Neurological Neurological Review of Symptoms: No symptoms reported    HEENT HEENT Symptoms Reported: No symptoms reported      Cardiovascular Cardiovascular Symptoms Reported: No symptoms reported    Respiratory Respiratory Symptoms Reported: No symptoms reported    Endocrine Patient reports the following symptoms related to hypoglycemia or hyperglycemia : No symptoms reported Is patient diabetic?: No    Gastrointestinal Gastrointestinal Symptoms Reported: No symptoms reported      Genitourinary Genitourinary Symptoms Reported: No symptoms reported Additional Genitourinary Details: pt denies any signs/ symptoms of UTI    Integumentary Integumentary Symptoms Reported: No symptoms reported    Musculoskeletal Musculoskelatal Symptoms Reviewed: No symptoms reported        Psychosocial Psychosocial Symptoms Reported: No symptoms reported         There were no vitals filed for this visit.  Medications Reviewed Today     Reviewed by Daralyn Earl, RN (Registered Nurse) on 03/26/24 at 1038  Med List Status: <None>   Medication Order Taking? Sig Documenting Provider Last Dose Status Informant  amoxicillin -clavulanate (AUGMENTIN ) 875-125 MG tablet [id number] No Take 1 tablet by mouth  2 (two) times daily. Lorel Roes, NP Taking Active   cephALEXin  (KEFLEX ) 500 MG capsule [id number] No Take 500 mg by mouth 4 (four) times daily. [provider] Taking Active   oxyCODONE -acetaminophen  (PERCOCET) 10-325 MG tablet [id number]  Take 1 tablet by mouth every 6 (six) hours as needed for up to 15 days for pain. Paseda, Folashade R, FNP  Active   Vitamin D , Ergocalciferol , (DRISDOL ) 1.25 MG (50000 UNIT) CAPS capsule [id number] No Take 1 capsule (50,000 Units total) by mouth every 7 (seven) days. Paseda, Folashade R, FNP Taking Active Self           Med Note Frieda Jew I   Wed Mar 11, 2024 10:21 PM) Need Refills            Recommendation:   PCP Follow-up  Follow Up Plan:   Telephone follow-up 04/03/24 @ 1015 am  Cecilie Coffee [facility], BSN RN Care Manager/ Transition of Care / [facility] [phone]

## 2024-03-26 NOTE — Patient Instructions (Signed)
 Visit Information  Thank you for taking time to visit with me today. Please don't hesitate to contact me if I can be of assistance to you before our next scheduled telephone appointment.  Our next appointment is by telephone on 04/03/24 at 1015 am  Following is a copy of your care plan:   Goals Addressed             This Visit's Progress    VBCI Transitions of Care (TOC) Care Plan       Problems:  Recent Hospitalization for treatment of Sepsis r/t UTI SDOH barrier: pt is living in his car, states he is supposed to go back to the hotel tomorrow 03/19/24 03/26/24- Pt reports he is in line and can talk briefly, pt states UTI "cleared up, no symptoms", pt states he is now living in a hotel, states he and girlfriend may try to "get something else and split the rent"  , states " someone with the sickle cell place called me and tried to help me but everything costs too much"  Pt did not speak with social work, unable to reach, pt states he will try to answer the phone  Goal:  Over the next 30 days, the patient will not experience hospital readmission  Interventions:   Evaluation of current treatment plan related to Sepsis r/t UTI, Housing barriers and Lacks knowledge of community resource: for housing options, self-management and patient's adherence to plan as established by provider. Discussed plans with patient for ongoing care management follow up and provided patient with direct contact information for care management team Evaluation of current treatment plan related to urinary tract infection and patient's adherence to plan as established by provider Advised patient to report any worsening urinary symptoms to primary care provider Reviewed medications with patient and discussed importance of taking as prescribed, taking antibiotic just as prescribed Reviewed scheduled/upcoming provider appointments including primary care provider Discussed plans with patient for ongoing care management  follow up and provided patient with direct contact information for care management team Reviewed signs/ symptoms urinary tract infection, importance of drinking adequate water Reviewed plan of care with patient including working with social work  Patient Self Care Activities:  Attend all scheduled provider appointments Attend church or other social activities Call pharmacy for medication refills 3-7 days in advance of running out of medications Call provider office for new concerns or questions  Notify RN Care Manager of TOC call rescheduling needs Participate in Transition of Care Program/Attend TOC scheduled calls Perform all self care activities independently  Take medications as prescribed   Work with the social worker to address care coordination needs and will continue to work with the clinical team to address health care and disease management related needs Please report any worsening urinary symptoms or signs of infection to your doctor Drink adequate fluids, preferably water Social worker will be contacting you about housing  Plan:  Telephone follow up appointment with care management team member scheduled for:  04/03/24 @ 1015 The patient has been provided with contact information for the care management team and has been advised to call with any health related questions or concerns.         Patient verbalizes understanding of instructions and care plan provided today and agrees to view in MyChart. Active MyChart status and patient understanding of how to access instructions and care plan via MyChart confirmed with patient.     Telephone follow up appointment with care management team member scheduled for:  Please call the care guide team at [phone] if you need to cancel or reschedule your appointment.   Please call the Suicide and Crisis Lifeline: 988 call the [place]  National Suicide Prevention Lifeline: (206) 504-0774 or TTY: 971-068-1348 TTY 712-067-1039) to talk to a  trained counselor call 1-800-273-TALK (toll free, 24 hour hotline) go to [facility] [address], [place] [phone]) call 911 if you are experiencing a Mental Health or Behavioral Health Crisis or need someone to talk to.  Cecilie Coffee [facility], BSN RN Care Manager/ Transition of Care / [facility] [phone]

## 2024-03-27 ENCOUNTER — Ambulatory Visit: Payer: Self-pay | Admitting: Nurse Practitioner

## 2024-03-27 DIAGNOSIS — K7460 Unspecified cirrhosis of liver: Secondary | ICD-10-CM

## 2024-03-27 DIAGNOSIS — R195 Other fecal abnormalities: Secondary | ICD-10-CM

## 2024-03-30 NOTE — Progress Notes (Unsigned)
 Complex Care Management Note Care Guide Note  03/30/2024 Name: Scott Norton MRN: [id number] DOB: 1966-08-16   Complex Care Management Outreach Attempts: A second unsuccessful outreach was attempted today to offer the patient with information about available complex care management services.  Follow Up Plan:  Additional outreach attempts will be made to offer the patient complex care management information and services.   Encounter Outcome:  No Answer  Barnie Bora  [facility]  [facility], [facility] Guide  Direct Dial: [phone]  Fax [phone]

## 2024-03-31 ENCOUNTER — Other Ambulatory Visit (HOSPITAL_COMMUNITY): Payer: Self-pay

## 2024-03-31 ENCOUNTER — Other Ambulatory Visit: Payer: Self-pay | Admitting: Nurse Practitioner

## 2024-03-31 DIAGNOSIS — G894 Chronic pain syndrome: Secondary | ICD-10-CM

## 2024-03-31 DIAGNOSIS — D571 Sickle-cell disease without crisis: Secondary | ICD-10-CM

## 2024-03-31 MED ORDER — OXYCODONE-ACETAMINOPHEN 10-325 MG PO TABS
1.0000 | ORAL_TABLET | Freq: Four times a day (QID) | ORAL | 0 refills | Status: DC | PRN
  Filled 2024-04-04: qty 60, 15d supply, fill #0

## 2024-03-31 NOTE — Progress Notes (Signed)
 Reviewed PDMP substance reporting system prior to prescribing opiate medications. No inconsistencies noted.      1. Sickle cell disease without crisis (HCC)  - oxyCODONE-acetaminophen (PERCOCET) 10-325 MG tablet; Take 1 tablet by mouth every 6 (six) hours as needed for up to 15 days for pain.  Dispense: 60 tablet; Refill: 0  2. Chronic pain syndrome  - oxyCODONE-acetaminophen (PERCOCET) 10-325 MG tablet; Take 1 tablet by mouth every 6 (six) hours as needed for up to 15 days for pain.  Dispense: 60 tablet; Refill: 0

## 2024-03-31 NOTE — Telephone Encounter (Signed)
 Can not refuse. Please cancel if needed. KH

## 2024-03-31 NOTE — Progress Notes (Signed)
 Complex Care Management Note Care Guide Note  03/31/2024 Name: DAGEN BEEVERS MRN: [id number] DOB: 08-26-1966   Complex Care Management Outreach Attempts: A third unsuccessful outreach was attempted today to offer the patient with information about available complex care management services.  Follow Up Plan:  No further outreach attempts will be made at this time. We have been unable to contact the patient to offer or enroll patient in complex care management services.  Encounter Outcome:  No Answer  Barnie Bora  [facility]  [facility], [facility] Guide  Direct Dial: [phone]  Fax [phone]

## 2024-04-03 ENCOUNTER — Other Ambulatory Visit: Payer: Self-pay | Admitting: *Deleted

## 2024-04-03 NOTE — Transitions of Care (Post Inpatient/ED Visit) (Signed)
  Transition of Care week 3  Visit Note  04/03/2024  Name: Scott Norton MRN: [id number]          DOB: 1966/01/21  Situation: Patient enrolled in [facility] 30-day program. Visit completed with patient by telephone.   Background:    Past Medical History:  Diagnosis Date   AKI (acute kidney injury) (HCC) 02/08/2021   Anemia    Cocaine abuse (HCC) 02/08/2021   Depression    Heart murmur    Seasonal allergies    wool & grass   Sickle cell anemia (HCC)    Suicidal ideation    Tobacco use disorder 11/06/2023    Assessment: Patient Reported Symptoms: Cognitive Cognitive Status: Able to follow simple commands, Alert and oriented to person, place, and time, Normal speech and language skills      Neurological Neurological Review of Symptoms: No symptoms reported    HEENT HEENT Symptoms Reported: No symptoms reported      Cardiovascular Cardiovascular Symptoms Reported: No symptoms reported    Respiratory Respiratory Symptoms Reported: No symptoms reported    Endocrine Patient reports the following symptoms related to hypoglycemia or hyperglycemia : No symptoms reported    Gastrointestinal Gastrointestinal Symptoms Reported: No symptoms reported      Genitourinary Genitourinary Symptoms Reported: No symptoms reported Additional Genitourinary Details: pt denies any signs/ symptoms of UTI Genitourinary Comment: encourged pt to stay well hydrated, preferably water  Integumentary Integumentary Symptoms Reported: No symptoms reported    Musculoskeletal Musculoskelatal Symptoms Reviewed: No symptoms reported        Psychosocial Psychosocial Symptoms Reported: No symptoms reported         There were no vitals filed for this visit.  Medications Reviewed Today   Medications were not reviewed in this encounter     Recommendation:   PCP Follow-up  Follow Up Plan:   Telephone follow-up 04/10/24 @ 10 am  Cecilie Coffee [facility], BSN RN Care Manager/ Transition of Care [place]/  [facility] [phone]

## 2024-04-03 NOTE — Patient Instructions (Signed)
 Visit Information  Thank you for taking time to visit with me today. Please don't hesitate to contact me if I can be of assistance to you before our next scheduled telephone appointment.  Our next appointment is by telephone on 04/10/24 @ 10 am  Following is a copy of your care plan:   Goals Addressed             This Visit's Progress    VBCI Transitions of Care (TOC) Care Plan       Problems:  Recent Hospitalization for treatment of Sepsis r/t UTI SDOH barrier: pt is living in his car, states he is supposed to go back to the hotel tomorrow 03/19/24 03/26/24- Pt reports he is in line and can talk briefly, pt states UTI cleared up, no symptoms, pt states he is now living in a hotel, states he and girlfriend may try to get something else and split the rent  , states  someone with the sickle cell place called me and tried to help me but everything costs too much  Pt did not speak with social work, unable to reach, pt states he will try to answer the phone 04/03/24- Pt reports he is doing okay denies any signs/ symptoms UTI, trying to drink adequate fluids, currently living in hotel  Goal:  Over the next 30 days, the patient will not experience hospital readmission  Interventions:   Evaluation of current treatment plan related to Sepsis r/t UTI, Housing barriers and Lacks knowledge of community resource: for housing options, self-management and patient's adherence to plan as established by provider. Discussed plans with patient for ongoing care management follow up and provided patient with direct contact information for care management  Reviewed upcoming scheduled appointments Reviewed signs/ symptoms urinary tract infection, importance of drinking adequate water Reinforced plan of care with patient including working with social work, number given for care guide and asked pt to call to get scheduled with social work, pt verbalizes understanding  Patient Self Care Activities:  Attend  all scheduled provider appointments Attend church or other social activities Call pharmacy for medication refills 3-7 days in advance of running out of medications Call provider office for new concerns or questions  Notify RN Care Manager of TOC call rescheduling needs Participate in Transition of Care Program/Attend TOC scheduled calls Perform all self care activities independently  Take medications as prescribed   Work with the social worker to address care coordination needs and will continue to work with the clinical team to address health care and disease management related needs Please report any worsening urinary symptoms or signs of infection to your doctor Drink adequate fluids, preferably water Please call care guide @ [phone] to schedule with social worker  Plan:  Telephone follow up appointment with care management team member scheduled for:  04/10/24 @ 10 am with Brown Cape The patient has been provided with contact information for the care management team and has been advised to call with any health related questions or concerns.         Patient verbalizes understanding of instructions and care plan provided today and agrees to view in MyChart. Active MyChart status and patient understanding of how to access instructions and care plan via MyChart confirmed with patient.     Telephone follow up appointment with care management team member scheduled for: 04/10/24 @ 10 am with Brown Cape RN  Please call the care guide team at [phone] if you need to cancel or reschedule your appointment.  Please call the Suicide and Crisis Lifeline: 988 call the [place]  National Suicide Prevention Lifeline: (347)647-1753 or TTY: 916-016-3373 TTY (817)776-1442) to talk to a trained counselor call 1-800-273-TALK (toll free, 24 hour hotline) go to [facility] Urgent Care [address], [place] [phone]) call 911 if you are experiencing a Mental  Health or Behavioral Health Crisis or need someone to talk to.  Cecilie Coffee [facility], BSN RN Care Manager/ Transition of Care [place]/ [facility] [phone]

## 2024-04-04 ENCOUNTER — Other Ambulatory Visit (HOSPITAL_COMMUNITY): Payer: Self-pay

## 2024-04-10 ENCOUNTER — Other Ambulatory Visit

## 2024-04-10 NOTE — Patient Instructions (Signed)
 Visit Information  Thank you for taking time to visit with me today. Please don't hesitate to contact me if I can be of assistance to you before our next scheduled telephone appointment.  Our next appointment is by telephone on April 17, 2024 at 10:00 AM with Cecilie Coffee.  Following is a copy of your care plan:   Goals Addressed             This Visit's Progress    VBCI Transitions of Care (TOC) Care Plan   On track    Problems:  Recent Hospitalization for treatment of Sepsis r/t UTI SDOH barrier: pt is living in his car, states he is supposed to go back to the hotel tomorrow 03/19/24 03/26/24- Pt reports he is in line and can talk briefly, pt states UTI cleared up, no symptoms, pt states he is now living in a hotel, states he and girlfriend may try to get something else and split the rent  , states  someone with the sickle cell place called me and tried to help me but everything costs too much  Pt did not speak with social work, unable to reach, pt states he will try to answer the phone 04/03/24- Pt reports he is doing okay denies any signs/ symptoms UTI, trying to drink adequate fluids, currently living in hotel 04/10/24   Goal:  Over the next 30 days, the patient will not experience hospital readmission  Interventions:   Evaluation of current treatment plan related to Sepsis r/t UTI, Housing barriers and Lacks knowledge of community resource: for housing options, self-management and patient's adherence to plan as established by provider. Discussed plans with patient for ongoing care management follow up and provided patient with direct contact information for care management  Reviewed upcoming scheduled appointments Reviewed signs/ symptoms urinary tract infection, importance of drinking adequate water Reinforced plan of care with patient including working with social work, number given for care guide and asked pt to call to get scheduled with social work, pt verbalizes  understanding  Patient Self Care Activities:  Attend all scheduled provider appointments Attend church or other social activities Call pharmacy for medication refills 3-7 days in advance of running out of medications Call provider office for new concerns or questions  Notify RN Care Manager of TOC call rescheduling needs Participate in Transition of Care Program/Attend TOC scheduled calls Perform all self care activities independently  Take medications as prescribed   Work with the social worker to address care coordination needs and will continue to work with the clinical team to address health care and disease management related needs Please report any worsening urinary symptoms or signs of infection to your doctor 04/10/24 Drink adequate fluids, preferably water, upcoming summer days heat is expected 04/10/24 Please call care guide @ [phone] to schedule with social worker  Plan:  Telephone follow up appointment with care management team member scheduled for:  6/70/25 @ 10 am with Cecilie Coffee should be final call, patient agreed to call The patient has been provided with contact information for the care management team and has been advised to call with any health related questions or concerns.         Patient verbalizes understanding of instructions and care plan provided today and agrees to view in MyChart. Active MyChart status and patient understanding of how to access instructions and care plan via MyChart confirmed with patient.     Telephone follow up appointment with care management team member scheduled for: The patient  has been provided with contact information for the care management team and has been advised to call with any health related questions or concerns.  Will reach out to care guides to schedule appointment with social worker regarding assistance with housing  Please call the care guide team at [phone]  if you need to cancel or reschedule your appointment.    Please call the [place]  National Suicide Prevention Lifeline: 4306456748 or TTY: 320-813-2002 TTY (364) 855-5299) to talk to a trained counselor if you are experiencing a Mental Health or Behavioral Health Crisis or need someone to talk to.  Brown Cape, RN, BSN, CCM [facility], [facility] RN Care Manager Direct Dial: [phone]

## 2024-04-10 NOTE — Transitions of Care (Post Inpatient/ED Visit) (Signed)
  Transition of Care week 4  Visit Note  04/10/2024  Name: Scott Norton MRN: [id number]          DOB: 01/09/1966  Situation: Patient enrolled in [facility] 30-day program. Visit completed with patient by telephone.   Background:   Initial Transition Care Management Follow-up Telephone Call    Past Medical History:  Diagnosis Date   AKI (acute kidney injury) (HCC) 02/08/2021   Anemia    Cocaine abuse (HCC) 02/08/2021   Depression    Heart murmur    Seasonal allergies    wool & grass   Sickle cell anemia (HCC)    Suicidal ideation    Tobacco use disorder 11/06/2023    Assessment: Patient Reported Symptoms: Cognitive Cognitive Status: Alert and oriented to person, place, and time      Neurological Neurological Review of Symptoms: No symptoms reported    HEENT HEENT Symptoms Reported: No symptoms reported      Cardiovascular Cardiovascular Symptoms Reported: No symptoms reported    Respiratory Respiratory Symptoms Reported: No symptoms reported Other Respiratory Symptoms: Everything is alright    Endocrine Patient reports the following symptoms related to hypoglycemia or hyperglycemia : Not assessed    Gastrointestinal Gastrointestinal Symptoms Reported: No symptoms reported      Genitourinary Genitourinary Symptoms Reported: No symptoms reported Additional Genitourinary Details: I said everything is alright    Integumentary Integumentary Symptoms Reported: Not assessed    Musculoskeletal Musculoskelatal Symptoms Reviewed: Other Other Musculoskeletal Symptoms: Sickle cell pain controlled by meds        Psychosocial Psychosocial Symptoms Reported: No symptoms reported Additional Psychological Details: Tired of all these calls from everywhere, too many calls not stressed out about it, it's just the same things over and over from you all, calls from [employer], doctor's office Behavioral Health Conditions: Other Other Behavorial Health Conditions: Sickle  Cell       Patient states no changes, doing alright. States no medications changes   Medications Reviewed Today   Medications were not reviewed in this encounter     Recommendation:   Continue Current Plan of Care follow up for housing needs with Care Coordination team  Follow Up Plan:   Telephone follow-up in 1 week to return call CMA for scheduling appointment with social worker, patient verbalized understanding and that he wrote the information down.  Brown Cape, RN, BSN, CCM [facility], [facility] RN Care Manager Direct Dial: [phone]

## 2024-04-15 ENCOUNTER — Other Ambulatory Visit: Payer: Self-pay | Admitting: Nurse Practitioner

## 2024-04-15 ENCOUNTER — Other Ambulatory Visit (HOSPITAL_COMMUNITY): Payer: Self-pay

## 2024-04-15 DIAGNOSIS — G894 Chronic pain syndrome: Secondary | ICD-10-CM

## 2024-04-15 DIAGNOSIS — D571 Sickle-cell disease without crisis: Secondary | ICD-10-CM

## 2024-04-15 MED ORDER — OXYCODONE-ACETAMINOPHEN 10-325 MG PO TABS
1.0000 | ORAL_TABLET | Freq: Four times a day (QID) | ORAL | 0 refills | Status: DC | PRN
  Filled 2024-04-20 (×2): qty 60, 15d supply, fill #0
  Filled ????-??-??: fill #0

## 2024-04-15 NOTE — Progress Notes (Signed)
 Reviewed PDMP substance reporting system prior to prescribing opiate medications. No inconsistencies noted.      1. Sickle cell disease without crisis (HCC)  - oxyCODONE-acetaminophen (PERCOCET) 10-325 MG tablet; Take 1 tablet by mouth every 6 (six) hours as needed for up to 15 days for pain.  Dispense: 60 tablet; Refill: 0  2. Chronic pain syndrome  - oxyCODONE-acetaminophen (PERCOCET) 10-325 MG tablet; Take 1 tablet by mouth every 6 (six) hours as needed for up to 15 days for pain.  Dispense: 60 tablet; Refill: 0

## 2024-04-15 NOTE — Telephone Encounter (Signed)
 Please advise [facility]

## 2024-04-17 ENCOUNTER — Other Ambulatory Visit: Payer: Self-pay | Admitting: *Deleted

## 2024-04-17 NOTE — Patient Instructions (Signed)
 Visit Information  Thank you for taking time to visit with me today. Please don't hesitate to contact me if I can be of assistance to you before our next scheduled telephone appointment.  Our next appointment is no further scheduled appointments.    Following is a copy of your care plan:   Goals Addressed             This Visit's Progress    COMPLETED: VBCI Transitions of Care (TOC) Care Plan       Problems:  Recent Hospitalization for treatment of Sepsis r/t UTI SDOH barrier: pt is living in his car, states he is supposed to go back to the hotel tomorrow 03/19/24 04/10/24 patient states ongoing barriers to housing needs 03/26/24- Pt reports he is in line and can talk briefly, pt states UTI cleared up, no symptoms, pt states he is now living in a hotel, states he and girlfriend may try to get something else and split the rent  , states  someone with the sickle cell place called me and tried to help me but everything costs too much  Pt did not speak with social work, unable to reach, pt states he will try to answer the phone 04/03/24- Pt reports he is doing okay denies any signs/ symptoms UTI, trying to drink adequate fluids, currently living in hotel 04/10/24 patient was given follow up numbers to contact [phone] also.  04/17/24- spoke with pt, he is driving his car, states he is busy hurry up and make this quick  conversation was brief today, pt declines further follow up, no new concerns reported  Goal:  Over the next 30 days, the patient will not experience hospital readmission  Interventions:   Evaluation of current treatment plan related to Sepsis r/t UTI, Housing barriers and Lacks knowledge of community resource: for housing options, self-management and patient's adherence to plan as established by provider. Discussed plans with patient for ongoing care management follow up and provided patient with direct contact information for care management  Reviewed upcoming  scheduled appointments Reviewed signs/ symptoms urinary tract infection, importance of drinking adequate water Reinforced plan of care with patient including TOC case closure, was unable to talk with pt about any further social work issues, etc due to pt wanted to end the call  Patient Self Care Activities:  Attend all scheduled provider appointments Attend church or other social activities Call pharmacy for medication refills 3-7 days in advance of running out of medications Call provider office for new concerns or questions  Notify RN Care Manager of TOC call rescheduling needs Participate in Transition of Care Program/Attend TOC scheduled calls Perform all self care activities independently  Take medications as prescribed   Work with the social worker to address care coordination needs and will continue to work with the clinical team to address health care and disease management related needs Please report any worsening urinary symptoms or signs of infection to your doctor  Drink adequate fluids, preferably water, upcoming summer days heat is expected  Please call care guide @ [phone] to schedule with social worker  Plan:  No further follow up required: TOC case closure The patient has been provided with contact information for the care management team and has been advised to call with any health related questions or concerns.         Patient verbalizes understanding of instructions and care plan provided today and agrees to view in MyChart. Active MyChart status and patient understanding of how to  access instructions and care plan via MyChart confirmed with patient.     The patient has been provided with contact information for the care management team and has been advised to call with any health related questions or concerns.   Please call the care guide team at [phone] if you need to cancel or reschedule your appointment.   Please call the Suicide and Crisis Lifeline:  988 call the [place]  National Suicide Prevention Lifeline: 714-622-3370 or TTY: 708-696-4536 TTY 423-473-7250) to talk to a trained counselor call 1-800-273-TALK (toll free, 24 hour hotline) go to [facility] Ur[facility] Care [address] [place] [phone]) call 911 if you are experiencing a Mental Health or Behavioral Health Crisis or need someone to talk to.  Mliss Creed [facility], BSN RN Care Manager/ Transition of Care [place]/ [facility] [phone]

## 2024-04-17 NOTE — Patient Outreach (Signed)
 Transition of Care week 5  Visit Note  04/17/2024  Name: Scott Norton MRN: [id number]          DOB: 07-15-1966  Situation: Patient enrolled in [facility] 30-day program. Visit completed with patient by telephone.   Background:    Past Medical History:  Diagnosis Date   AKI (acute kidney injury) (HCC) 02/08/2021   Anemia    Cocaine abuse (HCC) 02/08/2021   Depression    Heart murmur    Seasonal allergies    wool & grass   Sickle cell anemia (HCC)    Suicidal ideation    Tobacco use disorder 11/06/2023    Assessment: Patient Reported Symptoms: Cognitive Cognitive Status: Able to follow simple commands, Alert and oriented to person, place, and time, Normal speech and language skills      Neurological Neurological Review of Symptoms: No symptoms reported    HEENT HEENT Symptoms Reported: No symptoms reported      Cardiovascular Cardiovascular Symptoms Reported: No symptoms reported    Respiratory Respiratory Symptoms Reported: No symptoms reported    Endocrine Patient reports the following symptoms related to hypoglycemia or hyperglycemia : No symptoms reported Is patient diabetic?: No    Gastrointestinal Gastrointestinal Symptoms Reported: No symptoms reported      Genitourinary Genitourinary Symptoms Reported: No symptoms reported    Integumentary Integumentary Symptoms Reported: No symptoms reported    Musculoskeletal Musculoskelatal Symptoms Reviewed: No symptoms reported        Psychosocial Psychosocial Symptoms Reported: No symptoms reported         There were no vitals filed for this visit.  Medications Reviewed Today     Reviewed by Aura Mliss LABOR, RN (Registered Nurse) on 04/17/24 at 1006  Med List Status: <None>   Medication Order Taking? Sig Documenting Provider Last Dose Status Informant  amoxicillin -clavulanate (AUGMENTIN ) 875-125 MG tablet [id number] No Take 1 tablet by mouth 2 (two) times daily. Cherylene Homer HERO, NP Taking Active   cephALEXin   (KEFLEX ) 500 MG capsule [id number] No Take 500 mg by mouth 4 (four) times daily. [provider] Taking Active   oxyCODONE -acetaminophen  (PERCOCET) 10-325 MG tablet [id number]  Take 1 tablet by mouth every 6 (six) hours as needed for up to 15 days for pain. Paseda, Folashade R, FNP  Active   Vitamin D , Ergocalciferol , (DRISDOL ) 1.25 MG (50000 UNIT) CAPS capsule [id number] No Take 1 capsule (50,000 Units total) by mouth every 7 (seven) days. Paseda, Folashade R, FNP Taking Active Self           Med Note ALLEGRA, MUHAMMAD I   Wed Mar 11, 2024 10:21 PM) Need Refills            Goals Addressed             This Visit's Progress    COMPLETED: [facility] Transitions of Care (TOC) Care Plan       Problems:  Recent Hospitalization for treatment of Sepsis r/t UTI SDOH barrier: pt is living in his car, states he is supposed to go back to the hotel tomorrow 03/19/24 04/10/24 patient states ongoing barriers to housing needs 03/26/24- Pt reports he is in line and can talk briefly, pt states UTI cleared up, no symptoms, pt states he is now living in a hotel, states he and girlfriend may try to get something else and split the rent  , states  someone with the sickle cell place called me and tried to help me but everything costs too much  Pt did  not speak with social work, unable to reach, pt states he will try to answer the phone 04/03/24- Pt reports he is doing okay denies any signs/ symptoms UTI, trying to drink adequate fluids, currently living in hotel 04/10/24 patient was given follow up numbers to contact [phone] also.  04/17/24- spoke with pt, he is driving his car, states he is busy hurry up and make this quick  conversation was brief today, pt declines further follow up, no new concerns reported  Goal:  Over the next 30 days, the patient will not experience hospital readmission  Interventions:   Evaluation of current treatment plan related to Sepsis r/t UTI, Housing barriers and  Lacks knowledge of community resource: for housing options, self-management and patient's adherence to plan as established by provider. Discussed plans with patient for ongoing care management follow up and provided patient with direct contact information for care management  Reviewed upcoming scheduled appointments Reviewed signs/ symptoms urinary tract infection, importance of drinking adequate water Reinforced plan of care with patient including TOC case closure, was unable to talk with pt about any further social work issues, etc due to pt wanted to end the call  Patient Self Care Activities:  Attend all scheduled provider appointments Attend church or other social activities Call pharmacy for medication refills 3-7 days in advance of running out of medications Call provider office for new concerns or questions  Notify RN Care Manager of TOC call rescheduling needs Participate in Transition of Care Program/Attend TOC scheduled calls Perform all self care activities independently  Take medications as prescribed   Work with the social worker to address care coordination needs and will continue to work with the clinical team to address health care and disease management related needs Please report any worsening urinary symptoms or signs of infection to your doctor  Drink adequate fluids, preferably water, upcoming summer days heat is expected  Please call care guide @ [phone] to schedule with social worker  Plan:  No further follow up required: TOC case closure The patient has been provided with contact information for the care management team and has been advised to call with any health related questions or concerns.          Recommendation:   PCP Follow-up  Follow Up Plan:   Closing From:  Transitions of Care Program  Mliss Creed [place], BSN RN Care Manager/ Transition of Care [place]/ [facility] [phone]

## 2024-04-19 ENCOUNTER — Ambulatory Visit
Admission: RE | Admit: 2024-04-19 | Discharge: 2024-04-19 | Disposition: A | Discharge status: Home / Self Care | Source: Ambulatory Visit | Attending: Nurse Practitioner | Admitting: Nurse Practitioner

## 2024-04-19 DIAGNOSIS — R932 Abnormal findings on diagnostic imaging of liver and biliary tract: Secondary | ICD-10-CM

## 2024-04-19 MED ORDER — GADOPICLENOL 0.5 MMOL/ML IV SOLN
8.0000 mL | Freq: Once | INTRAVENOUS | Status: AC | PRN
  Administered 2024-04-19: 8 mL via INTRAVENOUS

## 2024-04-20 ENCOUNTER — Other Ambulatory Visit (HOSPITAL_COMMUNITY): Payer: Self-pay

## 2024-05-01 ENCOUNTER — Other Ambulatory Visit: Payer: Self-pay | Admitting: Nurse Practitioner

## 2024-05-01 DIAGNOSIS — D571 Sickle-cell disease without crisis: Secondary | ICD-10-CM

## 2024-05-01 DIAGNOSIS — G894 Chronic pain syndrome: Secondary | ICD-10-CM

## 2024-05-01 NOTE — Telephone Encounter (Signed)
 Please advise [facility] Thank you.

## 2024-05-02 LAB — COLOGUARD: COLOGUARD: POSITIVE — AB

## 2024-05-04 ENCOUNTER — Other Ambulatory Visit (HOSPITAL_COMMUNITY): Payer: Self-pay

## 2024-05-04 MED ORDER — OXYCODONE-ACETAMINOPHEN 10-325 MG PO TABS
1.0000 | ORAL_TABLET | Freq: Four times a day (QID) | ORAL | 0 refills | Status: DC | PRN
  Filled 2024-05-04: qty 60, 15d supply, fill #0

## 2024-05-08 ENCOUNTER — Encounter: Payer: Self-pay | Admitting: Nurse Practitioner

## 2024-05-08 ENCOUNTER — Ambulatory Visit (INDEPENDENT_AMBULATORY_CARE_PROVIDER_SITE_OTHER): Payer: Self-pay | Admitting: Nurse Practitioner

## 2024-05-08 ENCOUNTER — Other Ambulatory Visit (HOSPITAL_COMMUNITY): Payer: Self-pay

## 2024-05-08 VITALS — BP 118/57 | HR 54 | Temp 97.5°F | Wt 156.0 lb

## 2024-05-08 DIAGNOSIS — N1831 Chronic kidney disease, stage 3a: Secondary | ICD-10-CM

## 2024-05-08 DIAGNOSIS — R195 Other fecal abnormalities: Secondary | ICD-10-CM

## 2024-05-08 DIAGNOSIS — D571 Sickle-cell disease without crisis: Secondary | ICD-10-CM

## 2024-05-08 DIAGNOSIS — Z1329 Encounter for screening for other suspected endocrine disorder: Secondary | ICD-10-CM

## 2024-05-08 DIAGNOSIS — Z130 Encounter for screening for diseases of the blood and blood-forming organs and certain disorders involving the immune mechanism: Secondary | ICD-10-CM

## 2024-05-08 DIAGNOSIS — Z0000 Encounter for general adult medical examination without abnormal findings: Secondary | ICD-10-CM | POA: Diagnosis not present

## 2024-05-08 DIAGNOSIS — R809 Proteinuria, unspecified: Secondary | ICD-10-CM

## 2024-05-08 DIAGNOSIS — K7460 Unspecified cirrhosis of liver: Secondary | ICD-10-CM

## 2024-05-08 DIAGNOSIS — F17200 Nicotine dependence, unspecified, uncomplicated: Secondary | ICD-10-CM

## 2024-05-08 DIAGNOSIS — E559 Vitamin D deficiency, unspecified: Secondary | ICD-10-CM

## 2024-05-08 DIAGNOSIS — Z1321 Encounter for screening for nutritional disorder: Secondary | ICD-10-CM

## 2024-05-08 DIAGNOSIS — Z125 Encounter for screening for malignant neoplasm of prostate: Secondary | ICD-10-CM

## 2024-05-08 DIAGNOSIS — Z13228 Encounter for screening for other metabolic disorders: Secondary | ICD-10-CM

## 2024-05-08 MED ORDER — VITAMIN D (ERGOCALCIFEROL) 1.25 MG (50000 UNIT) PO CAPS
50000.0000 [IU] | ORAL_CAPSULE | ORAL | 1 refills | Status: AC
  Filled 2024-05-08 – 2024-05-19 (×2): qty 12, 84d supply, fill #0

## 2024-05-08 NOTE — Patient Instructions (Addendum)
 Please call [phone] to make an appointment for colon cancer screening    It is important that you exercise regularly at least 30 minutes 5 times a week as tolerated  Think about what you will eat, plan ahead. Choose  clean, green, fresh or frozen over canned, processed or packaged foods which are more sugary, salty and fatty. 70 to 75% of food eaten should be vegetables and fruit. Three meals at set times with snacks allowed between meals, but they must be fruit or vegetables. Aim to eat over a 12 hour period , example 7 am to 7 pm, and STOP after  your last meal of the day. Drink water,generally about 64 ounces per day, no other drink is as healthy. Fruit juice is best enjoyed in a healthy way, by EATING the fruit.  Thanks for choosing [facility] we consider it a privelige to serve you.

## 2024-05-08 NOTE — Assessment & Plan Note (Signed)
Follow up with GI for colonoscopy

## 2024-05-08 NOTE — Assessment & Plan Note (Signed)
 Noted on recent imaging Follow-up with GI as planned Lab Results  Component Value Date   ALT 23 03/12/2024   AST 40 03/12/2024   ALKPHOS 192 (H) 03/12/2024   BILITOT 4.0 (H) 03/12/2024

## 2024-05-08 NOTE — Assessment & Plan Note (Signed)
 Lab Results  Component Value Date   NA 140 03/25/2024   K 4.6 03/25/2024   CO2 18 (L) 03/25/2024   GLUCOSE 82 03/25/2024   BUN 19 03/25/2024   CREATININE 1.34 (H) 03/25/2024   CALCIUM  9.5 03/25/2024   EGFR 62 03/25/2024   GFRNONAA >60 03/16/2024

## 2024-05-08 NOTE — Assessment & Plan Note (Signed)
 Lab Results  Component Value Date   COLORU Yellow 09/24/2023   GLUCOSEUR negative 09/07/2021   BILIRUBINUR NEGATIVE 03/09/2024   KETONESU Negative 09/24/2023   SPECGRAV 1.011 09/24/2023   RBCUR small (A) 09/07/2021   PHUR 6.0 09/24/2023   PROTEINUR 100 (A) 03/09/2024   UROBILINOGEN 1.0 09/07/2021   LEUKOCYTESUR LARGE (A) 03/09/2024  Checking urine microalbumin

## 2024-05-08 NOTE — Assessment & Plan Note (Signed)
 On vitamin D  50,000 units once weekly Continue current medication Last vitamin D  Lab Results  Component Value Date   VD25OH 19.0 (L) 12/18/2023

## 2024-05-08 NOTE — Assessment & Plan Note (Signed)
 Annual exam as documented.  Counseling done include healthy lifestyle involving committing to 150 minutes of exercise per week, heart healthy diet,  The importance of adequate sleep also discussed.  Regular use of seat belt and home safety were also discussed . Changes in health habits are decided on by patient with goals and time frames set for achieving them. Immunization and cancer screening  needs are specifically addressed at this visit.

## 2024-05-08 NOTE — Assessment & Plan Note (Signed)
 Sickle cell disease -  We discussed the need for good hydration, monitoring of hydration status, avoidance of heat, cold, stress, and infection triggers. . The patient was reminded of the need to seek medical attention of any symptoms of bleeding, anemia, or infection.    Pulmonary evaluation - Patient denies severe recurrent wheezes, shortness of breath with exercise, or persistent cough. If these symptoms will place referral to Pulmonology for further evaluation.   Eye - High risk of proliferative retinopathy. Annual eye exam with retinal exam recommended to patient.  Referral to ophthalmology placed  Immunization status -need for pneumococcal vaccine shingles vaccine discussed patient declined the vaccines  Acute and chronic painful episodes -  We reviewed the terms of our pain agreement, including the need to keep medicines in a safe locked location away from children or pets, and the need to report excess sedation or constipation, measures to avoid constipation, and policies related to early refills and stolen prescriptions.  Continue Percocet 10-325 mg 1 tablet every 6 hours as needed

## 2024-05-08 NOTE — Assessment & Plan Note (Signed)
 Stated that he has constant to 2 cigarettes daily Cessation encouraged

## 2024-05-08 NOTE — Progress Notes (Signed)
 Complete physical exam  Patient: Scott Norton   DOB: [birth year]   58 y.o. Male  MRN: [id number]  Subjective:    Chief Complaint  Patient presents with   Sickle Cell Anemia    Scott Norton is a 58 y.o. male  has a past medical history of AKI (acute kidney injury) (HCC) (02/08/2021), Anemia, Cocaine abuse (HCC) (02/08/2021), Depression, Heart murmur, Seasonal allergies, Sickle cell anemia (HCC), Suicidal ideation, and Tobacco use disorder (11/06/2023). who presents today for a complete physical exam. He reports consuming a general diet.  Does walking daily daily He generally feels well. He reports sleeping well. He does not have additional problems to discuss today.   Sickle cell disease.  Not on folic acid  stated that folate acid makes him itch, takes Percocet 10 325 mg 1 tablet every 6 hours as needed, his sickle cell pain is currently rated 5/10 and it is mainly the shoulders, Percocet was last taken yesterday    Most recent fall risk assessment:    03/18/2024    2:49 PM  Fall Risk   Falls in the past year? 0     Most recent depression screenings:    05/08/2024   11:34 AM 03/18/2024    2:49 PM  PHQ 2/9 Scores  PHQ - 2 Score 0 0  PHQ- 9 Score 0         Patient Care Team: Maly Lemarr R, FNP as PCP - General (Nurse Practitioner)   Outpatient Medications Prior to Visit  Medication Sig   oxyCODONE -acetaminophen  (PERCOCET) 10-325 MG tablet Take 1 tablet by mouth every 6 (six) hours as needed for up to 15 days for pain.   [DISCONTINUED] amoxicillin -clavulanate (AUGMENTIN ) 875-125 MG tablet Take 1 tablet by mouth 2 (two) times daily. (Patient not taking: Reported on 05/08/2024)   [DISCONTINUED] cephALEXin  (KEFLEX ) 500 MG capsule Take 500 mg by mouth 4 (four) times daily.   [DISCONTINUED] Vitamin D , Ergocalciferol , (DRISDOL ) 1.25 MG (50000 UNIT) CAPS capsule Take 1 capsule (50,000 Units total) by mouth every 7 (seven) days. (Patient not taking: Reported on 05/08/2024)    No facility-administered medications prior to visit.    Review of Systems  Constitutional:  Negative for appetite change, chills, fatigue and fever.  HENT:  Negative for congestion, postnasal drip, rhinorrhea and sneezing.   Eyes:  Negative for pain, discharge and itching.  Respiratory:  Negative for cough, shortness of breath and wheezing.   Cardiovascular:  Negative for chest pain, palpitations and leg swelling.  Gastrointestinal:  Negative for abdominal pain, constipation, nausea and vomiting.  Endocrine: Negative for polydipsia, polyphagia and polyuria.  Genitourinary:  Negative for difficulty urinating, dysuria, flank pain and frequency.  Musculoskeletal:  Positive for arthralgias. Negative for joint swelling and myalgias.  Skin:  Negative for color change, pallor, rash and wound.  Allergic/Immunologic: Positive for immunocompromised state.  Neurological:  Negative for dizziness, facial asymmetry, weakness, numbness and headaches.  Psychiatric/Behavioral:  Negative for behavioral problems, confusion, self-injury and suicidal ideas.        Objective:     BP (!) 118/57   Pulse (!) 54   Temp (!) 97.5 F (36.4 C)   Wt 156 lb (70.8 kg)   SpO2 100%   BMI 19.50 kg/m    Physical Exam Vitals and nursing note reviewed.  Constitutional:      General: He is not in acute distress.    Appearance: Normal appearance. He is not ill-appearing, toxic-appearing or diaphoretic.  HENT:  Head:     Comments: Missing teeth    Right Ear: Tympanic membrane, ear canal and external ear normal. There is no impacted cerumen.     Left Ear: Tympanic membrane, ear canal and external ear normal. There is no impacted cerumen.     Nose: Nose normal. No congestion or rhinorrhea.     Mouth/Throat:     Mouth: Mucous membranes are moist.     Pharynx: Oropharynx is clear. No oropharyngeal exudate or posterior oropharyngeal erythema.  Eyes:     General: Scleral icterus present.        Right eye: No  discharge.        Left eye: No discharge.     Extraocular Movements: Extraocular movements intact.  Neck:     Vascular: No carotid bruit.  Cardiovascular:     Rate and Rhythm: Normal rate and regular rhythm.     Pulses: Normal pulses.     Heart sounds: Normal heart sounds. No murmur heard.    No friction rub. No gallop.  Pulmonary:     Effort: Pulmonary effort is normal. No respiratory distress.     Breath sounds: Normal breath sounds. No stridor. No wheezing, rhonchi or rales.  Chest:     Chest wall: No tenderness.  Abdominal:     General: Bowel sounds are normal. There is no distension.     Palpations: Abdomen is soft. There is no mass.     Tenderness: There is no abdominal tenderness. There is no right CVA tenderness, left CVA tenderness, guarding or rebound.     Hernia: No hernia is present.  Musculoskeletal:        General: No swelling, tenderness, deformity or signs of injury.     Cervical back: Normal range of motion and neck supple. No rigidity or tenderness.     Right lower leg: No edema.     Left lower leg: No edema.  Lymphadenopathy:     Cervical: No cervical adenopathy.  Skin:    General: Skin is warm and dry.     Capillary Refill: Capillary refill takes less than 2 seconds.     Coloration: Skin is not jaundiced or pale.     Findings: No bruising, erythema, lesion or rash.  Neurological:     Mental Status: He is alert and oriented to person, place, and time.     Cranial Nerves: No cranial nerve deficit.     Sensory: No sensory deficit.     Motor: No weakness.     Coordination: Coordination normal.     Gait: Gait normal.     Deep Tendon Reflexes: Reflexes normal.  Psychiatric:        Mood and Affect: Mood normal.        Behavior: Behavior normal.        Thought Content: Thought content normal.        Judgment: Judgment normal.     No results found for any visits on 05/08/24.     Assessment & Plan:    Routine Health Maintenance and Physical  Exam  Immunization History  Administered Date(s) Administered   Influenza Split 08/22/2009, 08/07/2010, 12/15/2012, 09/15/2014   Influenza, Seasonal, Injecte, Preservative Fre 09/15/2014   Tdap 02/05/2012, 12/25/2022    Health Maintenance  Topic Date Due   Medicare Annual Wellness (AWV)  Never done   Meningococcal B Vaccine (1 of 4 - Increased Risk) Never done   Hepatitis B Vaccines (1 of 3 - 19+ 3-dose series) Never done  COVID-19 Vaccine (1 - 2024-25 season) Never done   Zoster Vaccines- Shingrix (1 of 2) 08/08/2024 (Originally 11/13/2016)   Pneumococcal Vaccine 60-20 Years old (1 of 2 - PCV) 11/05/2024 (Originally 11/13/1985)   INFLUENZA VACCINE  05/22/2024   Fecal DNA (Cologuard)  04/28/2027   DTaP/Tdap/Td (3 - Td or Tdap) 12/24/2032   Hepatitis C Screening  Completed   HIV Screening  Completed   HPV VACCINES  Aged Out    Discussed health benefits of physical activity, and encouraged him to engage in regular exercise appropriate for his age and condition.  Problem List Items Addressed This Visit       Digestive   Cirrhosis of liver without ascites (HCC)   Noted on recent imaging Follow-up with GI as planned Lab Results  Component Value Date   ALT 23 03/12/2024   AST 40 03/12/2024   ALKPHOS 192 (H) 03/12/2024   BILITOT 4.0 (H) 03/12/2024           Genitourinary   Chronic kidney disease, stage 3a (HCC)   Lab Results  Component Value Date   NA 140 03/25/2024   K 4.6 03/25/2024   CO2 18 (L) 03/25/2024   GLUCOSE 82 03/25/2024   BUN 19 03/25/2024   CREATININE 1.34 (H) 03/25/2024   CALCIUM  9.5 03/25/2024   EGFR 62 03/25/2024   GFRNONAA >60 03/16/2024          Other   Sickle cell anemia (HCC)   Sickle cell disease -  We discussed the need for good hydration, monitoring of hydration status, avoidance of heat, cold, stress, and infection triggers. . The patient was reminded of the need to seek medical attention of any symptoms of bleeding, anemia, or infection.     Pulmonary evaluation - Patient denies severe recurrent wheezes, shortness of breath with exercise, or persistent cough. If these symptoms will place referral to Pulmonology for further evaluation.   Eye - High risk of proliferative retinopathy. Annual eye exam with retinal exam recommended to patient.  Referral to ophthalmology placed  Immunization status -need for pneumococcal vaccine shingles vaccine discussed patient declined the vaccines  Acute and chronic painful episodes -  We reviewed the terms of our pain agreement, including the need to keep medicines in a safe locked location away from children or pets, and the need to report excess sedation or constipation, measures to avoid constipation, and policies related to early refills and stolen prescriptions.  Continue Percocet 10-325 mg 1 tablet every 6 hours as needed         Relevant Orders   Ambulatory referral to Ophthalmology   Sickle Cell Panel   ToxAssure Flex 15, Ur   Vitamin D  deficiency   On vitamin D  50,000 units once weekly Continue current medication Last vitamin D  Lab Results  Component Value Date   VD25OH 19.0 (L) 12/18/2023         Relevant Medications   Vitamin D , Ergocalciferol , (DRISDOL ) 1.25 MG (50000 UNIT) CAPS capsule   Tobacco use disorder   Stated that he has constant to 2 cigarettes daily Cessation encouraged      Proteinuria   Lab Results  Component Value Date   COLORU Yellow 09/24/2023   GLUCOSEUR negative 09/07/2021   BILIRUBINUR NEGATIVE 03/09/2024   KETONESU Negative 09/24/2023   SPECGRAV 1.011 09/24/2023   RBCUR small (A) 09/07/2021   PHUR 6.0 09/24/2023   PROTEINUR 100 (A) 03/09/2024   UROBILINOGEN 1.0 09/07/2021   LEUKOCYTESUR LARGE (A) 03/09/2024  Checking urine microalbumin  Relevant Orders   Ambulatory referral to Nephrology   POCT glycosylated hemoglobin (Hb A1C)   Annual physical exam - Primary   Annual exam as documented.  Counseling done include healthy  lifestyle involving committing to 150 minutes of exercise per week, heart healthy diet,  The importance of adequate sleep also discussed.  Regular use of seat belt and home safety were also discussed . Changes in health habits are decided on by patient with goals and time frames set for achieving them. Immunization and cancer screening  needs are specifically addressed at this visit.         Positive colorectal cancer screening using Cologuard test   Follow-up with GI for colonoscopy      Other Visit Diagnoses       Screening for endocrine, nutritional, metabolic and immunity disorder       Relevant Orders   Lipid panel     Screening for prostate cancer       Relevant Orders   PSA      Return in about 3 months (around 08/08/2024), or SCD, for FASTING LABS next WEEK.     Osker Ayoub R Delonte Musich, FNP

## 2024-05-11 LAB — POCT URINE DIPSTICK
Bilirubin, UA: NEGATIVE
Glucose, UA: NEGATIVE mg/dL
Ketones, POC UA: NEGATIVE mg/dL
Leukocytes, UA: NEGATIVE
Nitrite, UA: NEGATIVE
POC PROTEIN,UA: 100 — AB
Spec Grav, UA: 1.015 (ref 1.010–1.025)
Urobilinogen, UA: 1 U/dL
pH, UA: 5.5 (ref 5.0–8.0)

## 2024-05-14 ENCOUNTER — Other Ambulatory Visit: Payer: Self-pay

## 2024-05-14 ENCOUNTER — Other Ambulatory Visit: Payer: Self-pay | Admitting: Nurse Practitioner

## 2024-05-14 DIAGNOSIS — D571 Sickle-cell disease without crisis: Secondary | ICD-10-CM

## 2024-05-14 DIAGNOSIS — Z130 Encounter for screening for diseases of the blood and blood-forming organs and certain disorders involving the immune mechanism: Secondary | ICD-10-CM

## 2024-05-14 DIAGNOSIS — G894 Chronic pain syndrome: Secondary | ICD-10-CM

## 2024-05-14 DIAGNOSIS — Z125 Encounter for screening for malignant neoplasm of prostate: Secondary | ICD-10-CM

## 2024-05-14 LAB — TOXASSURE FLEX 15, UR
6-ACETYLMORPHINE IA: NEGATIVE ng/mL
7-aminoclonazepam: NOT DETECTED ng/mg{creat}
AMPHETAMINES IA: NEGATIVE ng/mL
Alpha-hydroxyalprazolam: NOT DETECTED ng/mg{creat}
Alpha-hydroxymidazolam: NOT DETECTED ng/mg{creat}
Alpha-hydroxytriazolam: NOT DETECTED ng/mg{creat}
Alprazolam: NOT DETECTED ng/mg{creat}
BARBITURATES IA: NEGATIVE ng/mL
Buprenorphine: NOT DETECTED ng/mg{creat}
CANNABINOIDS IA: NEGATIVE ng/mL
COCAINE METABOLITE IA: NEGATIVE ng/mL
Clonazepam: NOT DETECTED ng/mg{creat}
Creatinine: 110 mg/dL (ref >=20)
Desalkylflurazepam: NOT DETECTED ng/mg{creat}
Desmethyldiazepam: NOT DETECTED ng/mg{creat}
Desmethylflunitrazepam: NOT DETECTED ng/mg{creat}
Diazepam: NOT DETECTED ng/mg{creat}
ETHYL ALCOHOL Enzymatic: NEGATIVE g/dL
Fentanyl: NOT DETECTED ng/mg{creat}
Flunitrazepam: NOT DETECTED ng/mg{creat}
Lorazepam: NOT DETECTED ng/mg{creat}
METHADONE IA: NEGATIVE ng/mL
METHADONE MTB IA: NEGATIVE ng/mL
Midazolam: NOT DETECTED ng/mg{creat}
Norbuprenorphine: NOT DETECTED ng/mg{creat}
Norfentanyl: NOT DETECTED ng/mg{creat}
OPIATE CLASS IA: NEGATIVE ng/mL
Oxazepam: NOT DETECTED ng/mg{creat}
PHENCYCLIDINE IA: NEGATIVE ng/mL
TAPENTADOL, IA: NEGATIVE ng/mL
TRAMADOL IA: NEGATIVE ng/mL
Temazepam: NOT DETECTED ng/mg{creat}

## 2024-05-14 LAB — OXYCODONE CLASS, MS, UR RFX
Noroxycodone: 342 ng/mg{creat}
Noroxymorphone: 105 ng/mg{creat}
Oxycodone Class Confirmation: POSITIVE
Oxycodone: 75 ng/mg{creat}
Oxymorphone: 293 ng/mg{creat}

## 2024-05-14 NOTE — Telephone Encounter (Signed)
 Please advise [facility]

## 2024-05-15 ENCOUNTER — Ambulatory Visit: Payer: Self-pay | Admitting: Nurse Practitioner

## 2024-05-15 ENCOUNTER — Other Ambulatory Visit (HOSPITAL_COMMUNITY): Payer: Self-pay

## 2024-05-15 ENCOUNTER — Other Ambulatory Visit: Payer: Self-pay | Admitting: Nurse Practitioner

## 2024-05-15 DIAGNOSIS — D571 Sickle-cell disease without crisis: Secondary | ICD-10-CM

## 2024-05-15 DIAGNOSIS — R972 Elevated prostate specific antigen [PSA]: Secondary | ICD-10-CM

## 2024-05-15 DIAGNOSIS — G894 Chronic pain syndrome: Secondary | ICD-10-CM

## 2024-05-15 LAB — LIPID PANEL
Chol/HDL Ratio: 3.9 ratio (ref 0.0–5.0)
Cholesterol, Total: 146 mg/dL (ref 100–199)
HDL: 37 mg/dL — ABNORMAL LOW (ref >39)
LDL Chol Calc (NIH): 82 mg/dL (ref 0–99)
Triglycerides: 155 mg/dL — ABNORMAL HIGH (ref 0–149)
VLDL Cholesterol Cal: 27 mg/dL (ref 5–40)

## 2024-05-15 LAB — CMP14+CBC/D/PLT+FER+RETIC+V...
ALT: 15 IU/L (ref 0–44)
AST: 39 IU/L (ref 0–40)
Albumin: 4.4 g/dL (ref 3.8–4.9)
Alkaline Phosphatase: 278 IU/L — ABNORMAL HIGH (ref 44–121)
BUN/Creatinine Ratio: 13 (ref 9–20)
BUN: 19 mg/dL (ref 6–24)
Basophils Absolute: 0.1 x10E3/uL (ref 0.0–0.2)
Basos: 1 %
Bilirubin Total: 1.9 mg/dL — ABNORMAL HIGH (ref 0.0–1.2)
CO2: 16 mmol/L — ABNORMAL LOW (ref 20–29)
Calcium: 9.5 mg/dL (ref 8.7–10.2)
Chloride: 108 mmol/L — ABNORMAL HIGH (ref 96–106)
Creatinine, Ser: 1.5 mg/dL — ABNORMAL HIGH (ref 0.76–1.27)
EOS (ABSOLUTE): 0.3 x10E3/uL (ref 0.0–0.4)
Eos: 4 %
Ferritin: 649 ng/mL — ABNORMAL HIGH (ref 30–400)
Globulin, Total: 2.6 g/dL (ref 1.5–4.5)
Glucose: 82 mg/dL (ref 70–99)
Hematocrit: 25.8 % — ABNORMAL LOW (ref 37.5–51.0)
Hemoglobin: 8.4 g/dL — ABNORMAL LOW (ref 13.0–17.7)
Immature Grans (Abs): 0 x10E3/uL (ref 0.0–0.1)
Immature Granulocytes: 0 %
Lymphocytes Absolute: 3.5 x10E3/uL — ABNORMAL HIGH (ref 0.7–3.1)
Lymphs: 43 %
MCH: 30.7 pg (ref 26.6–33.0)
MCHC: 32.6 g/dL (ref 31.5–35.7)
MCV: 94 fL (ref 79–97)
Monocytes Absolute: 1 x10E3/uL — ABNORMAL HIGH (ref 0.1–0.9)
Monocytes: 13 %
NRBC: 4 % — ABNORMAL HIGH (ref 0–0)
Neutrophils Absolute: 3.2 x10E3/uL (ref 1.4–7.0)
Neutrophils: 39 %
Platelets: 170 x10E3/uL (ref 150–450)
Potassium: 5.2 mmol/L (ref 3.5–5.2)
RBC: 2.74 x10E6/uL — CL (ref 4.14–5.80)
RDW: 20.9 % — ABNORMAL HIGH (ref 11.6–15.4)
Retic Ct Pct: 9 % — ABNORMAL HIGH (ref 0.6–2.6)
Sodium: 140 mmol/L (ref 134–144)
Total Protein: 7 g/dL (ref 6.0–8.5)
Vit D, 25-Hydroxy: 57.9 ng/mL (ref 30.0–100.0)
WBC: 8.1 x10E3/uL (ref 3.4–10.8)
eGFR: 54 mL/min/1.73 — ABNORMAL LOW (ref >59)

## 2024-05-15 LAB — PSA: Prostate Specific Ag, Serum: 4.2 ng/mL — ABNORMAL HIGH (ref 0.0–4.0)

## 2024-05-15 MED ORDER — OXYCODONE-ACETAMINOPHEN 10-325 MG PO TABS
1.0000 | ORAL_TABLET | Freq: Four times a day (QID) | ORAL | 0 refills | Status: DC | PRN
  Filled 2024-05-19: qty 60, 15d supply, fill #0
  Filled ????-??-??: fill #0

## 2024-05-15 NOTE — Progress Notes (Signed)
 Reviewed PDMP substance reporting system prior to prescribing opiate medications. No inconsistencies noted.      1. Sickle cell disease without crisis (HCC)  - oxyCODONE-acetaminophen (PERCOCET) 10-325 MG tablet; Take 1 tablet by mouth every 6 (six) hours as needed for up to 15 days for pain.  Dispense: 60 tablet; Refill: 0  2. Chronic pain syndrome  - oxyCODONE-acetaminophen (PERCOCET) 10-325 MG tablet; Take 1 tablet by mouth every 6 (six) hours as needed for up to 15 days for pain.  Dispense: 60 tablet; Refill: 0

## 2024-05-19 ENCOUNTER — Other Ambulatory Visit (HOSPITAL_COMMUNITY): Payer: Self-pay

## 2024-05-29 ENCOUNTER — Other Ambulatory Visit: Payer: Self-pay | Admitting: Nurse Practitioner

## 2024-05-29 ENCOUNTER — Other Ambulatory Visit (HOSPITAL_COMMUNITY): Payer: Self-pay

## 2024-05-29 DIAGNOSIS — G894 Chronic pain syndrome: Secondary | ICD-10-CM

## 2024-05-29 DIAGNOSIS — D571 Sickle-cell disease without crisis: Secondary | ICD-10-CM

## 2024-05-29 MED ORDER — OXYCODONE-ACETAMINOPHEN 10-325 MG PO TABS
1.0000 | ORAL_TABLET | Freq: Four times a day (QID) | ORAL | 0 refills | Status: DC | PRN
Start: 2024-06-03 — End: 2024-06-14
  Filled 2024-06-03: qty 60, 15d supply, fill #0
  Filled ????-??-??: fill #0

## 2024-05-29 NOTE — Telephone Encounter (Signed)
 Reviewed PDMP substance reporting system prior to prescribing opiate medications. No inconsistencies noted.      1. Sickle cell disease without crisis (HCC)  - oxyCODONE-acetaminophen (PERCOCET) 10-325 MG tablet; Take 1 tablet by mouth every 6 (six) hours as needed for up to 15 days for pain.  Dispense: 60 tablet; Refill: 0  2. Chronic pain syndrome  - oxyCODONE-acetaminophen (PERCOCET) 10-325 MG tablet; Take 1 tablet by mouth every 6 (six) hours as needed for up to 15 days for pain.  Dispense: 60 tablet; Refill: 0

## 2024-05-29 NOTE — Telephone Encounter (Signed)
 Please advise [facility]

## 2024-06-03 ENCOUNTER — Other Ambulatory Visit (HOSPITAL_COMMUNITY): Payer: Self-pay

## 2024-06-14 ENCOUNTER — Other Ambulatory Visit: Payer: Self-pay | Admitting: Nurse Practitioner

## 2024-06-14 DIAGNOSIS — D571 Sickle-cell disease without crisis: Secondary | ICD-10-CM

## 2024-06-14 DIAGNOSIS — G894 Chronic pain syndrome: Secondary | ICD-10-CM

## 2024-06-15 ENCOUNTER — Other Ambulatory Visit (HOSPITAL_COMMUNITY): Payer: Self-pay

## 2024-06-15 MED ORDER — OXYCODONE-ACETAMINOPHEN 10-325 MG PO TABS
1.0000 | ORAL_TABLET | Freq: Four times a day (QID) | ORAL | 0 refills | Status: DC | PRN
  Filled 2024-06-18: qty 60, 15d supply, fill #0
  Filled ????-??-??: fill #0

## 2024-06-15 NOTE — Telephone Encounter (Signed)
 Reviewed PDMP substance reporting system prior to prescribing opiate medications. No inconsistencies noted.      1. Sickle cell disease without crisis (HCC)  - oxyCODONE-acetaminophen (PERCOCET) 10-325 MG tablet; Take 1 tablet by mouth every 6 (six) hours as needed for up to 15 days for pain.  Dispense: 60 tablet; Refill: 0  2. Chronic pain syndrome  - oxyCODONE-acetaminophen (PERCOCET) 10-325 MG tablet; Take 1 tablet by mouth every 6 (six) hours as needed for up to 15 days for pain.  Dispense: 60 tablet; Refill: 0

## 2024-06-15 NOTE — Telephone Encounter (Signed)
 Please advise [facility]

## 2024-06-18 ENCOUNTER — Other Ambulatory Visit (HOSPITAL_COMMUNITY): Payer: Self-pay

## 2024-06-29 ENCOUNTER — Other Ambulatory Visit (HOSPITAL_COMMUNITY): Payer: Self-pay

## 2024-06-29 ENCOUNTER — Other Ambulatory Visit: Payer: Self-pay | Admitting: Nurse Practitioner

## 2024-06-29 DIAGNOSIS — D571 Sickle-cell disease without crisis: Secondary | ICD-10-CM

## 2024-06-29 DIAGNOSIS — G894 Chronic pain syndrome: Secondary | ICD-10-CM

## 2024-06-29 MED ORDER — OXYCODONE-ACETAMINOPHEN 10-325 MG PO TABS
1.0000 | ORAL_TABLET | Freq: Four times a day (QID) | ORAL | 0 refills | Status: DC | PRN
  Filled 2024-07-03: qty 60, 15d supply, fill #0
  Filled ????-??-??: fill #0

## 2024-06-29 NOTE — Telephone Encounter (Signed)
 Reviewed PDMP substance reporting system prior to prescribing opiate medications. No inconsistencies noted.      1. Sickle cell disease without crisis (HCC)  - oxyCODONE-acetaminophen (PERCOCET) 10-325 MG tablet; Take 1 tablet by mouth every 6 (six) hours as needed for up to 15 days for pain.  Dispense: 60 tablet; Refill: 0  2. Chronic pain syndrome  - oxyCODONE-acetaminophen (PERCOCET) 10-325 MG tablet; Take 1 tablet by mouth every 6 (six) hours as needed for up to 15 days for pain.  Dispense: 60 tablet; Refill: 0

## 2024-07-03 ENCOUNTER — Other Ambulatory Visit (HOSPITAL_COMMUNITY): Payer: Self-pay

## 2024-07-14 ENCOUNTER — Other Ambulatory Visit (HOSPITAL_COMMUNITY): Payer: Self-pay

## 2024-07-14 ENCOUNTER — Other Ambulatory Visit: Payer: Self-pay | Admitting: Nurse Practitioner

## 2024-07-14 DIAGNOSIS — G894 Chronic pain syndrome: Secondary | ICD-10-CM

## 2024-07-14 DIAGNOSIS — D571 Sickle-cell disease without crisis: Secondary | ICD-10-CM

## 2024-07-14 MED ORDER — OXYCODONE-ACETAMINOPHEN 10-325 MG PO TABS
1.0000 | ORAL_TABLET | Freq: Four times a day (QID) | ORAL | 0 refills | Status: DC | PRN
  Filled 2024-07-18: qty 60, 15d supply, fill #0

## 2024-07-14 NOTE — Telephone Encounter (Signed)
 Reviewed PDMP substance reporting system prior to prescribing opiate medications. No inconsistencies noted.      1. Sickle cell disease without crisis (HCC)  - oxyCODONE-acetaminophen (PERCOCET) 10-325 MG tablet; Take 1 tablet by mouth every 6 (six) hours as needed for up to 15 days for pain.  Dispense: 60 tablet; Refill: 0  2. Chronic pain syndrome  - oxyCODONE-acetaminophen (PERCOCET) 10-325 MG tablet; Take 1 tablet by mouth every 6 (six) hours as needed for up to 15 days for pain.  Dispense: 60 tablet; Refill: 0

## 2024-07-14 NOTE — Telephone Encounter (Signed)
 Please advise [facility]

## 2024-07-18 ENCOUNTER — Other Ambulatory Visit (HOSPITAL_COMMUNITY): Payer: Self-pay

## 2024-07-28 ENCOUNTER — Other Ambulatory Visit (HOSPITAL_COMMUNITY): Payer: Self-pay

## 2024-07-28 ENCOUNTER — Other Ambulatory Visit: Payer: Self-pay | Admitting: Nurse Practitioner

## 2024-07-28 DIAGNOSIS — D571 Sickle-cell disease without crisis: Secondary | ICD-10-CM

## 2024-07-28 DIAGNOSIS — G894 Chronic pain syndrome: Secondary | ICD-10-CM

## 2024-07-28 MED ORDER — OXYCODONE-ACETAMINOPHEN 10-325 MG PO TABS
1.0000 | ORAL_TABLET | Freq: Four times a day (QID) | ORAL | 0 refills | Status: DC | PRN
  Filled 2024-08-02: qty 60, 15d supply, fill #0

## 2024-07-28 NOTE — Telephone Encounter (Signed)
 Please advise [facility]

## 2024-07-28 NOTE — Telephone Encounter (Signed)
 Reviewed PDMP substance reporting system prior to prescribing opiate medications. No inconsistencies noted.      1. Sickle cell disease without crisis (HCC)  - oxyCODONE-acetaminophen (PERCOCET) 10-325 MG tablet; Take 1 tablet by mouth every 6 (six) hours as needed for up to 15 days for pain.  Dispense: 60 tablet; Refill: 0  2. Chronic pain syndrome  - oxyCODONE-acetaminophen (PERCOCET) 10-325 MG tablet; Take 1 tablet by mouth every 6 (six) hours as needed for up to 15 days for pain.  Dispense: 60 tablet; Refill: 0

## 2024-07-29 ENCOUNTER — Other Ambulatory Visit (HOSPITAL_COMMUNITY): Payer: Self-pay

## 2024-08-02 ENCOUNTER — Other Ambulatory Visit (HOSPITAL_COMMUNITY): Payer: Self-pay

## 2024-08-10 ENCOUNTER — Encounter: Payer: Self-pay | Admitting: Nurse Practitioner

## 2024-08-10 ENCOUNTER — Ambulatory Visit (INDEPENDENT_AMBULATORY_CARE_PROVIDER_SITE_OTHER): Payer: Self-pay | Admitting: Nurse Practitioner

## 2024-08-10 VITALS — BP 117/54 | HR 61 | Temp 97.5°F | Wt 156.0 lb

## 2024-08-10 DIAGNOSIS — G894 Chronic pain syndrome: Secondary | ICD-10-CM

## 2024-08-10 DIAGNOSIS — D571 Sickle-cell disease without crisis: Secondary | ICD-10-CM | POA: Diagnosis not present

## 2024-08-10 DIAGNOSIS — R809 Proteinuria, unspecified: Secondary | ICD-10-CM

## 2024-08-10 DIAGNOSIS — R972 Elevated prostate specific antigen [PSA]: Secondary | ICD-10-CM | POA: Insufficient documentation

## 2024-08-10 DIAGNOSIS — R195 Other fecal abnormalities: Secondary | ICD-10-CM

## 2024-08-10 DIAGNOSIS — E559 Vitamin D deficiency, unspecified: Secondary | ICD-10-CM

## 2024-08-10 DIAGNOSIS — N1831 Chronic kidney disease, stage 3a: Secondary | ICD-10-CM

## 2024-08-10 DIAGNOSIS — F17200 Nicotine dependence, unspecified, uncomplicated: Secondary | ICD-10-CM

## 2024-08-10 NOTE — Assessment & Plan Note (Signed)
 Elevated prostate-specific antigen (PSA) PSA level is elevated at 4.2. Discussed the importance of follow-up with a specialist, but he is currently prioritizing other health concerns.

## 2024-08-10 NOTE — Assessment & Plan Note (Addendum)
 We discussed the need for good hydration, monitoring of hydration status, avoidance of heat, cold, stress, and infection triggers. We discussed the need to be adherent with taking H home medications. Patient was reminded of the need to seek medical attSickle cell disease without crisis Sickle cell disease is well-managed without current crisis. Folic acid  was discussed but not taken due to side effects of itching. General Health Maintenance He declined flu and COVID vaccines due to personal beliefs. Discussed the benefits of vaccines, especially in the context of sickle cell disease. Plans to see an eye doctor for routine check-up and glasses. - Encourage annual eye examination.  Ophthalmology contact information provided - Discuss benefits of vaccinations, especially for those with sickle cell disease.

## 2024-08-10 NOTE — Assessment & Plan Note (Signed)
.   Referral to nephrologist was made, but he has not yet seen the specialist. Discussed potential use of ACEI/ARB for kidney protection, but his blood pressure is stable. - Encourage follow-up with nephrologist for proteinuria evaluation.

## 2024-08-10 NOTE — Assessment & Plan Note (Signed)
 Lab Results  Component Value Date   NA 140 05/14/2024   K 5.2 05/14/2024   CO2 16 (L) 05/14/2024   GLUCOSE 82 05/14/2024   BUN 19 05/14/2024   CREATININE 1.50 (H) 05/14/2024   CALCIUM  9.5 05/14/2024   EGFR 54 (L) 05/14/2024   GFRNONAA >60 03/16/2024

## 2024-08-10 NOTE — Assessment & Plan Note (Signed)
 Abnormal colorectal cancer screening (positive Cologuard) Positive Cologuard test, which could indicate polyps or colon cancer. He is hesitant to follow up with gastroenterologist due to financial concerns and skepticism about the necessity of further testing. - Encourage follow-up with gastroenterologist for further evaluation of positive Cologuard test.

## 2024-08-10 NOTE — Patient Instructions (Addendum)
 Referring To Provider Information [facility] [address] [place] [address] [phone]       [facility] [address] [place] [address] [place] [address] [phone]   Referring To Provider Information [facility] [address] [place] [address] [phone]   Referral Start Date: 05/08/2024 Referral End Date: 05/08/2025   It is important that you exercise regularly at least 30 minutes 5 times a week as tolerated  Think about what you will eat, plan ahead. Choose  clean, green, fresh or frozen over canned, processed or packaged foods which are more sugary, salty and fatty. 70 to 75% of food eaten should be vegetables and fruit. Three meals at set times with snacks allowed between meals, but they must be fruit or vegetables. Aim to eat over a 12 hour period , example 7 am to 7 pm, and STOP after  your last meal of the day. Drink water,generally about 64 ounces per day, no other drink is as healthy. Fruit juice is best enjoyed in a healthy way, by EATING the fruit.  Thanks for choosing [facility] we consider it a privelige to serve you.

## 2024-08-10 NOTE — Assessment & Plan Note (Signed)
 Takes OTC vitamin D  supplement Rechecking labs Last vitamin D  Lab Results  Component Value Date   VD25OH 57.9 05/14/2024

## 2024-08-10 NOTE — Assessment & Plan Note (Signed)
 Smokes about 2 Cigarettes /day  Asked about quitting: confirms that he currently smokes cigarettes Advise to quit smoking: Educated about QUITTING to reduce the risk of cancer, cardio and cerebrovascular disease. Assess willingness: Unwilling to quit at this time, but is working on cutting back. Assist with counseling and pharmacotherapy: Counseled and literature provided. Arrange for follow up: follow up in 3 months and continue to offer help.

## 2024-08-10 NOTE — Assessment & Plan Note (Signed)
 Chronic pain syndrome Chronic pain is managed with Percocet (oxycodone -acetaminophen ) 10-325 mg as needed. Last taken two days ago. - Continue Percocet 10-325 mg as needed, every 6 hours. - Store medication securely and away from children and pets.

## 2024-08-10 NOTE — Progress Notes (Signed)
 Established Patient Office Visit  Subjective:  Patient ID: Scott Norton, male    DOB: 1966-04-08  Age: 58 y.o. MRN: [id number]  CC: No chief complaint on file.   HPI    Discussed the use of AI scribe software for clinical note transcription with the patient, who gave verbal consent to proceed.  History of Present Illness Scott Norton is a 58 year old male   has a past medical history of AKI (acute kidney injury) (02/08/2021), Anemia, Cocaine abuse (HCC) (02/08/2021), Depression, Heart murmur, Seasonal allergies, Sickle cell anemia (HCC), Suicidal ideation, and Tobacco use disorder (11/06/2023). with  who presents for a  follow-up appointment for sickle cell disease  He feels well overall with no fever, chills, or chest pain. He takes over-the-counter vitamin D  supplements but is unsure of the dosage. He is interested in obtaining potassium supplements from [employer].  He has sickle cell disease and uses Percocet 10/325 mg, one tablet every six hours as needed for pain. He last took Percocet two days ago and uses it once a day. He avoids ibuprofen  . No recent sickle cell crises. He does not take folic acid  due to itching as a side effect.  He was referred to a nephrologist due to protein in his urine but has not yet seen him. He also mentions a positive Cologuard test but feels fine and is hesitant to pursue further evaluation.  He smokes two cigarettes a day and is working on quitting. He denies any use of recreational drugs, including marijuana. No blood in stool, stomach pain, nausea, vomiting, blood in urine, difficulty urinating, or swelling in legs. He has not seen an eye doctor recently but wants to get glasses.    Assessment & Plan      Past Medical History:  Diagnosis Date   AKI (acute kidney injury) 02/08/2021   Anemia    Cocaine abuse (HCC) 02/08/2021   Depression    Heart murmur    Seasonal allergies    wool & grass   Sickle cell anemia (HCC)    Suicidal  ideation    Tobacco use disorder 11/06/2023    Past Surgical History:  Procedure Laterality Date   NO PAST SURGERIES     ROOT CANAL      Family History  Problem Relation Age of Onset   Alcohol  abuse Mother    Sickle cell trait Mother    Hypertension Mother    Alcohol  abuse Father    Sickle cell trait Father    Early death Father        due to alcoholism   Hypertension Father    Hypertension Sister    Hypertension Brother    Hypertension Maternal Grandmother    Hypertension Maternal Grandfather    Hypertension Paternal Grandmother    Hypertension Paternal Grandfather     Social History   Socioeconomic History   Marital status: Significant Other    Spouse name: Not on file   Number of children: 3   Years of education: Not on file   Highest education level: Not on file  Occupational History   Not on file  Tobacco Use   Smoking status: Some Days    Current packs/day: 0.50    Types: Cigars, Cigarettes   Smokeless tobacco: Never   Tobacco comments:    10 cigars per week.  Vaping Use   Vaping status: Never Used  Substance and Sexual Activity   Alcohol  use: Not Currently   Drug use: Not  Currently    Types: Marijuana, Cocaine   Sexual activity: Yes  Other Topics Concern   Not on file  Social History Narrative   Lives with Friends in [place]. Working on housing for himself   On disability for his SS disease      No children, no stable relationship   Social Drivers of Corporate investment banker Strain: Not on file  Food Insecurity: No Food Insecurity (03/18/2024)   Hunger Vital Sign    Worried About Running Out of Food in the Last Year: Never true    Ran Out of Food in the Last Year: Never true  Transportation Needs: No Transportation Needs (03/18/2024)   PRAPARE - Administrator, Civil Service (Medical): No    Lack of Transportation (Non-Medical): No  Physical Activity: Not on file  Stress: Not on file  Social Connections: Not on file  Intimate  Partner Violence: Not At Risk (03/18/2024)   Humiliation, Afraid, Rape, and Kick questionnaire    Fear of Current or Ex-Partner: No    Emotionally Abused: No    Physically Abused: No    Sexually Abused: No    Outpatient Medications Prior to Visit  Medication Sig Dispense Refill   BL POTASSIUM PO Take by mouth.     oxyCODONE -acetaminophen  (PERCOCET) 10-325 MG tablet Take 1 tablet by mouth every 6 (six) hours as needed for up to 15 days for pain. 60 tablet 0   VITAMIN D  PO Take by mouth.     Vitamin D , Ergocalciferol , (DRISDOL ) 1.25 MG (50000 UNIT) CAPS capsule Take 1 capsule (50,000 Units total) by mouth every 7 (seven) days. (Patient not taking: Reported on 08/10/2024) 12 capsule 1   No facility-administered medications prior to visit.    Allergies  Allergen Reactions   Aspirin Other (See Comments)    Increased heart rate   Tape Rash   Tramadol  Hives and Itching    ROS Review of Systems  Constitutional:  Negative for appetite change, chills, fatigue and fever.  HENT:  Negative for congestion, postnasal drip, rhinorrhea and sneezing.   Respiratory:  Negative for cough, shortness of breath and wheezing.   Cardiovascular:  Negative for chest pain, palpitations and leg swelling.  Gastrointestinal:  Negative for abdominal pain, constipation, nausea and vomiting.  Genitourinary:  Negative for difficulty urinating, dysuria, flank pain and frequency.  Musculoskeletal:  Negative for arthralgias, back pain, joint swelling and myalgias.  Skin:  Negative for color change, pallor, rash and wound.  Neurological:  Negative for dizziness, facial asymmetry, weakness, numbness and headaches.  Psychiatric/Behavioral:  Negative for behavioral problems, confusion, self-injury and suicidal ideas.       Objective:    Physical Exam Vitals and nursing note reviewed.  Constitutional:      General: He is not in acute distress.    Appearance: Normal appearance. He is not ill-appearing,  toxic-appearing or diaphoretic.  Eyes:     General: No scleral icterus.       Right eye: No discharge.        Left eye: No discharge.     Extraocular Movements: Extraocular movements intact.     Conjunctiva/sclera: Conjunctivae normal.  Cardiovascular:     Rate and Rhythm: Normal rate and regular rhythm.     Pulses: Normal pulses.     Heart sounds: Normal heart sounds. No murmur heard.    No friction rub. No gallop.  Pulmonary:     Effort: Pulmonary effort is normal. No respiratory distress.  Breath sounds: Normal breath sounds. No stridor. No wheezing, rhonchi or rales.  Chest:     Chest wall: No tenderness.  Abdominal:     General: There is no distension.     Palpations: Abdomen is soft.     Tenderness: There is no abdominal tenderness. There is no right CVA tenderness, left CVA tenderness or guarding.  Musculoskeletal:        General: No swelling, tenderness, deformity or signs of injury.     Right lower leg: No edema.     Left lower leg: No edema.  Skin:    General: Skin is warm and dry.     Capillary Refill: Capillary refill takes less than 2 seconds.     Coloration: Skin is not jaundiced or pale.     Findings: No bruising, erythema or lesion.  Neurological:     Mental Status: He is alert and oriented to person, place, and time.     Motor: No weakness.     Gait: Gait normal.  Psychiatric:        Mood and Affect: Mood normal.        Behavior: Behavior normal.        Thought Content: Thought content normal.        Judgment: Judgment normal.     BP (!) 117/54   Pulse 61   Temp (!) 97.5 F (36.4 C)   Wt 156 lb (70.8 kg)   SpO2 100%   BMI 19.50 kg/m  Wt Readings from Last 3 Encounters:  08/10/24 156 lb (70.8 kg)  05/08/24 156 lb (70.8 kg)  03/18/24 162 lb (73.5 kg)    Lab Results  Component Value Date   TSH 1.730 03/01/2021   Lab Results  Component Value Date   WBC 8.1 05/14/2024   HGB 8.4 (L) 05/14/2024   HCT 25.8 (L) 05/14/2024   MCV 94 05/14/2024    PLT 170 05/14/2024   Lab Results  Component Value Date   NA 140 05/14/2024   K 5.2 05/14/2024   CO2 16 (L) 05/14/2024   GLUCOSE 82 05/14/2024   BUN 19 05/14/2024   CREATININE 1.50 (H) 05/14/2024   BILITOT 1.9 (H) 05/14/2024   ALKPHOS 278 (H) 05/14/2024   AST 39 05/14/2024   ALT 15 05/14/2024   PROT 7.0 05/14/2024   ALBUMIN 4.4 05/14/2024   CALCIUM  9.5 05/14/2024   ANIONGAP 8 03/16/2024   EGFR 54 (L) 05/14/2024   Lab Results  Component Value Date   CHOL 146 05/14/2024   Lab Results  Component Value Date   HDL 37 (L) 05/14/2024   Lab Results  Component Value Date   LDLCALC 82 05/14/2024   Lab Results  Component Value Date   TRIG 155 (H) 05/14/2024   Lab Results  Component Value Date   CHOLHDL 3.9 05/14/2024   Lab Results  Component Value Date   HGBA1C <4.2 (L) 03/01/2021      Assessment & Plan:   Problem List Items Addressed This Visit       Genitourinary   Chronic kidney disease, stage 3a (HCC)   Lab Results  Component Value Date   NA 140 05/14/2024   K 5.2 05/14/2024   CO2 16 (L) 05/14/2024   GLUCOSE 82 05/14/2024   BUN 19 05/14/2024   CREATININE 1.50 (H) 05/14/2024   CALCIUM  9.5 05/14/2024   EGFR 54 (L) 05/14/2024   GFRNONAA >60 03/16/2024           Other  Sickle cell anemia (HCC) - Primary   We discussed the need for good hydration, monitoring of hydration status, avoidance of heat, cold, stress, and infection triggers. We discussed the need to be adherent with taking H home medications. Patient was reminded of the need to seek medical attSickle cell disease without crisis Sickle cell disease is well-managed without current crisis. Folic acid  was discussed but not taken due to side effects of itching. General Health Maintenance He declined flu and COVID vaccines due to personal beliefs. Discussed the benefits of vaccines, especially in the context of sickle cell disease. Plans to see an eye doctor for routine check-up and glasses. -  Encourage annual eye examination.  Ophthalmology contact information provided - Discuss benefits of vaccinations, especially for those with sickle cell disease.        Relevant Orders   Sickle Cell Panel   ToxAssure Flex 15, Ur   Chronic pain   Chronic pain syndrome Chronic pain is managed with Percocet (oxycodone -acetaminophen ) 10-325 mg as needed. Last taken two days ago. - Continue Percocet 10-325 mg as needed, every 6 hours. - Store medication securely and away from children and pets.       Relevant Orders   ToxAssure Flex 15, Ur   Vitamin D  deficiency   Takes OTC vitamin D  supplement Rechecking labs Last vitamin D  Lab Results  Component Value Date   VD25OH 57.9 05/14/2024         Tobacco use disorder   Smokes about 2 Cigarettes /day  Asked about quitting: confirms that he currently smokes cigarettes Advise to quit smoking: Educated about QUITTING to reduce the risk of cancer, cardio and cerebrovascular disease. Assess willingness: Unwilling to quit at this time, but is working on cutting back. Assist with counseling and pharmacotherapy: Counseled and literature provided. Arrange for follow up: follow up in 3 months and continue to offer help.       Proteinuria   . Referral to nephrologist was made, but he has not yet seen the specialist. Discussed potential use of ACEI/ARB for kidney protection, but his blood pressure is stable. - Encourage follow-up with nephrologist for proteinuria evaluation.       Positive colorectal cancer screening using Cologuard test   Abnormal colorectal cancer screening (positive Cologuard) Positive Cologuard test, which could indicate polyps or colon cancer. He is hesitant to follow up with gastroenterologist due to financial concerns and skepticism about the necessity of further testing. - Encourage follow-up with gastroenterologist for further evaluation of positive Cologuard test.       Elevated PSA, less than 10 ng/ml    Elevated prostate-specific antigen (PSA) PSA level is elevated at 4.2. Discussed the importance of follow-up with a specialist, but he is currently prioritizing other health concerns.       Other Visit Diagnoses       Elevated PSA           No orders of the defined types were placed in this encounter.   Follow-up: Return in about 3 months (around 11/10/2024), or SCD.    Kimanh Templeman R Ashawn Rinehart, FNP

## 2024-08-11 ENCOUNTER — Telehealth: Payer: Self-pay

## 2024-08-11 ENCOUNTER — Ambulatory Visit: Payer: Self-pay | Admitting: Nurse Practitioner

## 2024-08-11 LAB — CMP14+CBC/D/PLT+FER+RETIC+V...
ALT: 16 IU/L (ref 0–44)
AST: 53 IU/L — ABNORMAL HIGH (ref 0–40)
Albumin: 4.4 g/dL (ref 3.8–4.9)
Alkaline Phosphatase: 269 IU/L — ABNORMAL HIGH (ref 47–123)
BUN/Creatinine Ratio: 10 (ref 9–20)
BUN: 17 mg/dL (ref 6–24)
Basophils Absolute: 0 x10E3/uL (ref 0.0–0.2)
Basos: 0 %
Bilirubin Total: 2.1 mg/dL — ABNORMAL HIGH (ref 0.0–1.2)
CO2: 18 mmol/L — ABNORMAL LOW (ref 20–29)
Calcium: 9.1 mg/dL (ref 8.7–10.2)
Chloride: 109 mmol/L — ABNORMAL HIGH (ref 96–106)
Creatinine, Ser: 1.68 mg/dL — ABNORMAL HIGH (ref 0.76–1.27)
EOS (ABSOLUTE): 0.3 x10E3/uL (ref 0.0–0.4)
Eos: 4 %
Ferritin: 430 ng/mL — ABNORMAL HIGH (ref 30–400)
Globulin, Total: 2 g/dL (ref 1.5–4.5)
Glucose: 101 mg/dL — ABNORMAL HIGH (ref 70–99)
Hematocrit: 22.3 % — ABNORMAL LOW (ref 37.5–51.0)
Hemoglobin: 7.2 g/dL — ABNORMAL LOW (ref 13.0–17.7)
Immature Grans (Abs): 0 x10E3/uL (ref 0.0–0.1)
Immature Granulocytes: 0 %
Lymphocytes Absolute: 2.8 x10E3/uL (ref 0.7–3.1)
Lymphs: 37 %
MCH: 32.7 pg (ref 26.6–33.0)
MCHC: 32.3 g/dL (ref 31.5–35.7)
MCV: 101 fL — ABNORMAL HIGH (ref 79–97)
Monocytes Absolute: 0.8 x10E3/uL (ref 0.1–0.9)
Monocytes: 10 %
NRBC: 10 % — ABNORMAL HIGH (ref 0–0)
Neutrophils Absolute: 3.6 x10E3/uL (ref 1.4–7.0)
Neutrophils: 49 %
Platelets: 174 x10E3/uL (ref 150–450)
Potassium: 5.1 mmol/L (ref 3.5–5.2)
RBC: 2.2 x10E6/uL — CL (ref 4.14–5.80)
RDW: 23.1 % — ABNORMAL HIGH (ref 11.6–15.4)
Retic Ct Pct: 10.9 % — ABNORMAL HIGH (ref 0.6–2.6)
Sodium: 140 mmol/L (ref 134–144)
Total Protein: 6.4 g/dL (ref 6.0–8.5)
Vit D, 25-Hydroxy: 51.2 ng/mL (ref 30.0–100.0)
WBC: 7.5 x10E3/uL (ref 3.4–10.8)
eGFR: 47 mL/min/1.73 — ABNORMAL LOW (ref >59)

## 2024-08-11 NOTE — Telephone Encounter (Signed)
 Called patient to go over lab results per FNP [facility]. LVM

## 2024-08-14 ENCOUNTER — Other Ambulatory Visit: Payer: Self-pay | Admitting: Nurse Practitioner

## 2024-08-14 ENCOUNTER — Other Ambulatory Visit (HOSPITAL_COMMUNITY): Payer: Self-pay

## 2024-08-14 DIAGNOSIS — D571 Sickle-cell disease without crisis: Secondary | ICD-10-CM

## 2024-08-14 DIAGNOSIS — G894 Chronic pain syndrome: Secondary | ICD-10-CM

## 2024-08-14 LAB — TOXASSURE FLEX 15, UR
6-ACETYLMORPHINE IA: NEGATIVE ng/mL
7-aminoclonazepam: NOT DETECTED ng/mg{creat}
AMPHETAMINES IA: NEGATIVE ng/mL
Alpha-hydroxyalprazolam: NOT DETECTED ng/mg{creat}
Alpha-hydroxymidazolam: NOT DETECTED ng/mg{creat}
Alpha-hydroxytriazolam: NOT DETECTED ng/mg{creat}
Alprazolam: NOT DETECTED ng/mg{creat}
BARBITURATES IA: NEGATIVE ng/mL
Buprenorphine: NOT DETECTED ng/mg{creat}
CANNABINOIDS IA: NEGATIVE ng/mL
COCAINE METABOLITE IA: NEGATIVE ng/mL
Clonazepam: NOT DETECTED ng/mg{creat}
Creatinine: 104 mg/dL (ref >=20)
Desalkylflurazepam: NOT DETECTED ng/mg{creat}
Desmethyldiazepam: NOT DETECTED ng/mg{creat}
Desmethylflunitrazepam: NOT DETECTED ng/mg{creat}
Diazepam: NOT DETECTED ng/mg{creat}
ETHYL ALCOHOL Enzymatic: NEGATIVE g/dL
Fentanyl: NOT DETECTED ng/mg{creat}
Flunitrazepam: NOT DETECTED ng/mg{creat}
Lorazepam: NOT DETECTED ng/mg{creat}
METHADONE IA: NEGATIVE ng/mL
METHADONE MTB IA: NEGATIVE ng/mL
Midazolam: NOT DETECTED ng/mg{creat}
Norbuprenorphine: NOT DETECTED ng/mg{creat}
Norfentanyl: NOT DETECTED ng/mg{creat}
OPIATE CLASS IA: NEGATIVE ng/mL
Oxazepam: NOT DETECTED ng/mg{creat}
PHENCYCLIDINE IA: NEGATIVE ng/mL
TAPENTADOL, IA: NEGATIVE ng/mL
TRAMADOL IA: NEGATIVE ng/mL
Temazepam: NOT DETECTED ng/mg{creat}

## 2024-08-14 LAB — OXYCODONE CLASS, MS, UR RFX
Noroxycodone: 54 ng/mg{creat}
Noroxymorphone: NOT DETECTED ng/mg{creat}
Oxycodone Class Confirmation: POSITIVE — AB
Oxycodone: NOT DETECTED ng/mg{creat}
Oxymorphone: 79 ng/mg{creat}

## 2024-08-14 MED ORDER — OXYCODONE-ACETAMINOPHEN 10-325 MG PO TABS
1.0000 | ORAL_TABLET | Freq: Four times a day (QID) | ORAL | 0 refills | Status: DC | PRN
  Filled 2024-08-17: qty 60, 15d supply, fill #0

## 2024-08-14 NOTE — Telephone Encounter (Signed)
 Reviewed PDMP substance reporting system prior to prescribing opiate medications. No inconsistencies noted.      1. Sickle cell disease without crisis (HCC)  - oxyCODONE-acetaminophen (PERCOCET) 10-325 MG tablet; Take 1 tablet by mouth every 6 (six) hours as needed for up to 15 days for pain.  Dispense: 60 tablet; Refill: 0  2. Chronic pain syndrome  - oxyCODONE-acetaminophen (PERCOCET) 10-325 MG tablet; Take 1 tablet by mouth every 6 (six) hours as needed for up to 15 days for pain.  Dispense: 60 tablet; Refill: 0

## 2024-08-17 ENCOUNTER — Other Ambulatory Visit (HOSPITAL_COMMUNITY): Payer: Self-pay

## 2024-08-27 ENCOUNTER — Other Ambulatory Visit: Payer: Self-pay | Admitting: Nurse Practitioner

## 2024-08-27 DIAGNOSIS — D571 Sickle-cell disease without crisis: Secondary | ICD-10-CM

## 2024-08-27 DIAGNOSIS — G894 Chronic pain syndrome: Secondary | ICD-10-CM

## 2024-08-28 ENCOUNTER — Other Ambulatory Visit (HOSPITAL_COMMUNITY): Payer: Self-pay

## 2024-08-28 MED ORDER — OXYCODONE-ACETAMINOPHEN 10-325 MG PO TABS
1.0000 | ORAL_TABLET | Freq: Four times a day (QID) | ORAL | 0 refills | Status: DC | PRN
  Filled 2024-09-01: qty 60, 15d supply, fill #0

## 2024-08-28 NOTE — Telephone Encounter (Signed)
 Reviewed PDMP substance reporting system prior to prescribing opiate medications. No inconsistencies noted.      1. Sickle cell disease without crisis (HCC)  - oxyCODONE-acetaminophen (PERCOCET) 10-325 MG tablet; Take 1 tablet by mouth every 6 (six) hours as needed for up to 15 days for pain.  Dispense: 60 tablet; Refill: 0  2. Chronic pain syndrome  - oxyCODONE-acetaminophen (PERCOCET) 10-325 MG tablet; Take 1 tablet by mouth every 6 (six) hours as needed for up to 15 days for pain.  Dispense: 60 tablet; Refill: 0

## 2024-08-28 NOTE — Telephone Encounter (Signed)
 Please advise [facility]

## 2024-09-01 ENCOUNTER — Other Ambulatory Visit (HOSPITAL_COMMUNITY): Payer: Self-pay

## 2024-09-14 ENCOUNTER — Other Ambulatory Visit: Payer: Self-pay | Admitting: Nurse Practitioner

## 2024-09-14 DIAGNOSIS — D571 Sickle-cell disease without crisis: Secondary | ICD-10-CM

## 2024-09-14 DIAGNOSIS — G894 Chronic pain syndrome: Secondary | ICD-10-CM

## 2024-09-15 ENCOUNTER — Other Ambulatory Visit (HOSPITAL_COMMUNITY): Payer: Self-pay

## 2024-09-15 MED ORDER — OXYCODONE-ACETAMINOPHEN 10-325 MG PO TABS
1.0000 | ORAL_TABLET | Freq: Four times a day (QID) | ORAL | 0 refills | Status: DC | PRN
  Filled 2024-09-16: qty 60, 15d supply, fill #0

## 2024-09-15 NOTE — Telephone Encounter (Signed)
 Please advise [facility]

## 2024-09-16 ENCOUNTER — Other Ambulatory Visit (HOSPITAL_COMMUNITY): Payer: Self-pay

## 2024-09-28 ENCOUNTER — Other Ambulatory Visit: Payer: Self-pay | Admitting: Nurse Practitioner

## 2024-09-28 DIAGNOSIS — G894 Chronic pain syndrome: Secondary | ICD-10-CM

## 2024-09-28 DIAGNOSIS — D571 Sickle-cell disease without crisis: Secondary | ICD-10-CM

## 2024-09-28 MED ORDER — OXYCODONE-ACETAMINOPHEN 10-325 MG PO TABS
1.0000 | ORAL_TABLET | Freq: Four times a day (QID) | ORAL | 0 refills | Status: AC | PRN
  Filled 2024-10-01: qty 60, 15d supply, fill #0

## 2024-09-28 NOTE — Telephone Encounter (Signed)
 Please advise [facility]

## 2024-09-29 ENCOUNTER — Other Ambulatory Visit (HOSPITAL_COMMUNITY): Payer: Self-pay

## 2024-10-01 ENCOUNTER — Other Ambulatory Visit (HOSPITAL_COMMUNITY): Payer: Self-pay

## 2024-10-12 ENCOUNTER — Other Ambulatory Visit: Payer: Self-pay | Admitting: Nurse Practitioner

## 2024-10-12 ENCOUNTER — Other Ambulatory Visit (HOSPITAL_COMMUNITY): Payer: Self-pay

## 2024-10-12 DIAGNOSIS — G894 Chronic pain syndrome: Secondary | ICD-10-CM

## 2024-10-12 DIAGNOSIS — D571 Sickle-cell disease without crisis: Secondary | ICD-10-CM

## 2024-10-12 MED ORDER — OXYCODONE-ACETAMINOPHEN 10-325 MG PO TABS
1.0000 | ORAL_TABLET | Freq: Four times a day (QID) | ORAL | 0 refills | Status: DC | PRN
  Filled 2024-10-16: qty 60, 15d supply, fill #0

## 2024-10-12 NOTE — Telephone Encounter (Signed)
 Please advise [facility]

## 2024-10-16 ENCOUNTER — Other Ambulatory Visit (HOSPITAL_COMMUNITY): Payer: Self-pay

## 2024-10-28 ENCOUNTER — Other Ambulatory Visit: Payer: Self-pay | Admitting: Nurse Practitioner

## 2024-10-28 DIAGNOSIS — D571 Sickle-cell disease without crisis: Secondary | ICD-10-CM

## 2024-10-28 DIAGNOSIS — G894 Chronic pain syndrome: Secondary | ICD-10-CM

## 2024-10-29 NOTE — Telephone Encounter (Signed)
oxyCODONE-acetaminophen (PERCOCET) 10-325 MG tablet  

## 2024-10-29 NOTE — Telephone Encounter (Signed)
 Please advise [facility]

## 2024-10-30 ENCOUNTER — Other Ambulatory Visit: Payer: Self-pay | Admitting: Nurse Practitioner

## 2024-10-30 ENCOUNTER — Other Ambulatory Visit (HOSPITAL_COMMUNITY): Payer: Self-pay

## 2024-10-30 DIAGNOSIS — D571 Sickle-cell disease without crisis: Secondary | ICD-10-CM

## 2024-10-30 DIAGNOSIS — G894 Chronic pain syndrome: Secondary | ICD-10-CM

## 2024-10-30 MED ORDER — OXYCODONE-ACETAMINOPHEN 10-325 MG PO TABS
1.0000 | ORAL_TABLET | Freq: Four times a day (QID) | ORAL | 0 refills | Status: DC | PRN
  Filled 2024-10-31: qty 60, 15d supply, fill #0

## 2024-10-30 NOTE — Telephone Encounter (Signed)
 Please advise [facility]

## 2024-10-31 ENCOUNTER — Other Ambulatory Visit (HOSPITAL_COMMUNITY): Payer: Self-pay

## 2024-11-10 ENCOUNTER — Ambulatory Visit (INDEPENDENT_AMBULATORY_CARE_PROVIDER_SITE_OTHER): Payer: Self-pay | Admitting: Nurse Practitioner

## 2024-11-10 ENCOUNTER — Encounter: Payer: Self-pay | Admitting: Nurse Practitioner

## 2024-11-10 ENCOUNTER — Other Ambulatory Visit: Payer: Self-pay | Admitting: Nurse Practitioner

## 2024-11-10 VITALS — BP 96/41 | HR 67 | Wt 153.0 lb

## 2024-11-10 DIAGNOSIS — E559 Vitamin D deficiency, unspecified: Secondary | ICD-10-CM | POA: Diagnosis not present

## 2024-11-10 DIAGNOSIS — G894 Chronic pain syndrome: Secondary | ICD-10-CM

## 2024-11-10 DIAGNOSIS — F17200 Nicotine dependence, unspecified, uncomplicated: Secondary | ICD-10-CM | POA: Diagnosis not present

## 2024-11-10 DIAGNOSIS — D571 Sickle-cell disease without crisis: Secondary | ICD-10-CM

## 2024-11-10 DIAGNOSIS — N1831 Chronic kidney disease, stage 3a: Secondary | ICD-10-CM

## 2024-11-10 NOTE — Assessment & Plan Note (Signed)
" °  Continues to smoke a couple of cigarettes a day, taking a few puffs each time. Discussed risks of lung cancer and COPD. - Encouraged smoking cessation "

## 2024-11-10 NOTE — Assessment & Plan Note (Addendum)
 We discussed the need for good hydration, monitoring of hydration status, avoidance of heat, cold, stress, and infection triggers. We discussed the need to be adherent with taking home medications as ordered. Patient was reminded of the need to seek medical attention immediately if any symptom of bleeding, anemia, or infection occurs.   Continue Percocet 10 325 mg , 1 tablet every 6 hours as needed for pain I reminded the patient that all patients receiving Schedule II narcotics must be seen for follow within the 3 months in the office.  We reviewed the terms of our pain agreement, including the need to keep medicines in a safe locked location away from children or pets, and the need to report excess sedation . Provided ophthalmology contact information to schedule an appointment for annual eye exam

## 2024-11-10 NOTE — Progress Notes (Addendum)
 "  Established Patient Office Visit  Subjective:  Patient ID: Scott Norton, male    DOB: June 19, 1966  Age: 59 y.o. MRN: [id number]  CC:  Chief Complaint  Patient presents with   Sickle Cell Anemia    HPI       Discussed the use of AI scribe software for clinical note transcription with the patient, who gave verbal consent to proceed.  History of Present Illness Scott Norton is a 59 year old male  has a past medical history of AKI (acute kidney injury) (02/08/2021), Anemia, Cocaine abuse (HCC) (02/08/2021), Depression, Heart murmur, Seasonal allergies, Sickle cell anemia (HCC), Suicidal ideation, and Tobacco use disorder (11/06/2023).  who presents for a routine follow-up visit for sickle cell disease  He has noticed an elevated heart rate without associated symptoms such as anxiety, chest pain, or shortness of breath. No abdominal pain, nausea, or vomiting.  He continues to experience chronic back pain due to sickle cell disease, currently rated as five out of ten, managed with Percocet 325 mg, one tablet every six hours, with the last dose taken yesterday.  No sickle cell crisis since his last visit. He has not seen an eye doctor in the past year but plans to schedule an appointment for a vision check and to get new glasses.    He is working on reducing his smoking habit, currently taking a few puffs from two or three cigarettes a day.He denies use of illicit drugs  He has a history of proteinuria, not started on ACE or ARB due to low blood pressure and he has declined nephrology referral.   His PSA levels have been slightly elevated, but he has not seen a urologist. He prefers to monitor the levels annually.  He has not seen an eye doctor in the past year but plans to schedule an appointment for a vision check and to get new glasses.     Assessment & Plan .    Past Medical History:  Diagnosis Date   AKI (acute kidney injury) 02/08/2021   Anemia    Cocaine abuse (HCC)  02/08/2021   Depression    Heart murmur    Seasonal allergies    wool & grass   Sickle cell anemia (HCC)    Suicidal ideation    Tobacco use disorder 11/06/2023    Past Surgical History:  Procedure Laterality Date   NO PAST SURGERIES     ROOT CANAL      Family History  Problem Relation Age of Onset   Alcohol  abuse Mother    Sickle cell trait Mother    Hypertension Mother    Alcohol  abuse Father    Sickle cell trait Father    Early death Father        due to alcoholism   Hypertension Father    Hypertension Sister    Hypertension Brother    Hypertension Maternal Grandmother    Hypertension Maternal Grandfather    Hypertension Paternal Grandmother    Hypertension Paternal Grandfather     Social History   Socioeconomic History   Marital status: Significant Other    Spouse name: Not on file   Number of children: 3   Years of education: Not on file   Highest education level: Not on file  Occupational History   Not on file  Tobacco Use   Smoking status: Some Days    Current packs/day: 0.50    Types: Cigars, Cigarettes   Smokeless tobacco: Never  Tobacco comments:    10 cigars per week.  Vaping Use   Vaping status: Never Used  Substance and Sexual Activity   Alcohol  use: Not Currently   Drug use: Not Currently    Types: Marijuana, Cocaine   Sexual activity: Yes  Other Topics Concern   Not on file  Social History Narrative   Lives with Friends in [place]. Working on housing for himself   On disability for his SS disease      No children, no stable relationship   Social Drivers of Health   Tobacco Use: High Risk (11/10/2024)   Patient History    Smoking Tobacco Use: Some Days    Smokeless Tobacco Use: Never    Passive Exposure: Not on file  Financial Resource Strain: Not on file  Food Insecurity: No Food Insecurity (11/10/2024)   Epic    Worried About Programme Researcher, Broadcasting/film/video in the Last Year: Never true    Ran Out of Food in the Last Year: Never true   Transportation Needs: No Transportation Needs (11/10/2024)   Epic    Lack of Transportation (Medical): No    Lack of Transportation (Non-Medical): No  Physical Activity: Not on file  Stress: Not on file  Social Connections: Not on file  Intimate Partner Violence: Not At Risk (11/10/2024)   Epic    Fear of Current or Ex-Partner: No    Emotionally Abused: No    Physically Abused: No    Sexually Abused: No  Depression (PHQ2-9): Low Risk (11/10/2024)   Depression (PHQ2-9)    PHQ-2 Score: 0  Alcohol  Screen: Not on file  Housing: High Risk (11/10/2024)   Epic    Unable to Pay for Housing in the Last Year: Yes    Number of Times Moved in the Last Year: Not on file    Homeless in the Last Year: Yes  Utilities: Not At Risk (11/10/2024)   Epic    Threatened with loss of utilities: No  Health Literacy: Not on file    Outpatient Medications Prior to Visit  Medication Sig Dispense Refill   BL POTASSIUM PO Take by mouth.     oxyCODONE -acetaminophen  (PERCOCET) 10-325 MG tablet Take 1 tablet by mouth every 6 (six) hours as needed for up to 15 days for pain. 60 tablet 0   VITAMIN D  PO Take by mouth.     VITAMIN D  PO Take by mouth.     Vitamin D , Ergocalciferol , (DRISDOL ) 1.25 MG (50000 UNIT) CAPS capsule Take 1 capsule (50,000 Units total) by mouth every 7 (seven) days. (Patient not taking: Reported on 11/10/2024) 12 capsule 1   No facility-administered medications prior to visit.    Allergies[1]  ROS Review of Systems  Constitutional:  Negative for appetite change, chills, fatigue and fever.  HENT:  Negative for congestion, postnasal drip, rhinorrhea and sneezing.   Respiratory:  Negative for cough, shortness of breath and wheezing.   Cardiovascular:  Negative for chest pain, palpitations and leg swelling.  Gastrointestinal:  Negative for abdominal pain, constipation, nausea and vomiting.  Genitourinary:  Negative for difficulty urinating, dysuria, flank pain and frequency.   Musculoskeletal:  Positive for back pain. Negative for arthralgias, joint swelling and myalgias.  Skin:  Negative for color change, pallor, rash and wound.  Neurological:  Negative for dizziness, facial asymmetry, weakness, numbness and headaches.  Psychiatric/Behavioral:  Negative for behavioral problems, confusion, self-injury and suicidal ideas.       Objective:    Physical Exam Vitals and  nursing note reviewed.  Constitutional:      General: He is not in acute distress.    Appearance: Normal appearance. He is not ill-appearing, toxic-appearing or diaphoretic.  HENT:     Mouth/Throat:     Pharynx: No posterior oropharyngeal erythema.  Eyes:     General: Scleral icterus present.        Right eye: No discharge.        Left eye: No discharge.     Extraocular Movements: Extraocular movements intact.  Cardiovascular:     Rate and Rhythm: Normal rate and regular rhythm.     Pulses: Normal pulses.     Heart sounds: Normal heart sounds. No murmur heard.    No friction rub. No gallop.  Pulmonary:     Effort: Pulmonary effort is normal. No respiratory distress.     Breath sounds: Normal breath sounds. No stridor. No wheezing, rhonchi or rales.  Chest:     Chest wall: No tenderness.  Abdominal:     General: There is no distension.     Palpations: Abdomen is soft.     Tenderness: There is no abdominal tenderness. There is no right CVA tenderness, left CVA tenderness or guarding.  Musculoskeletal:        General: No swelling, tenderness, deformity or signs of injury.     Right lower leg: No edema.     Left lower leg: No edema.  Skin:    General: Skin is warm and dry.     Capillary Refill: Capillary refill takes less than 2 seconds.     Coloration: Skin is not jaundiced or pale.     Findings: No bruising, erythema or lesion.  Neurological:     Mental Status: He is alert and oriented to person, place, and time.     Motor: No weakness.     Gait: Gait normal.  Psychiatric:         Mood and Affect: Mood normal.        Behavior: Behavior normal.        Thought Content: Thought content normal.        Judgment: Judgment normal.     BP (!) 96/41   Pulse 67   Wt 153 lb (69.4 kg)   SpO2 100%   BMI 19.12 kg/m  Wt Readings from Last 3 Encounters:  11/10/24 153 lb (69.4 kg)  08/10/24 156 lb (70.8 kg)  05/08/24 156 lb (70.8 kg)    Lab Results  Component Value Date   TSH 1.730 03/01/2021   Lab Results  Component Value Date   WBC 7.5 08/10/2024   HGB 7.2 (L) 08/10/2024   HCT 22.3 (L) 08/10/2024   MCV 101 (H) 08/10/2024   PLT 174 08/10/2024   Lab Results  Component Value Date   NA 140 08/10/2024   K 5.1 08/10/2024   CO2 18 (L) 08/10/2024   GLUCOSE 101 (H) 08/10/2024   BUN 17 08/10/2024   CREATININE 1.68 (H) 08/10/2024   BILITOT 2.1 (H) 08/10/2024   ALKPHOS 269 (H) 08/10/2024   AST 53 (H) 08/10/2024   ALT 16 08/10/2024   PROT 6.4 08/10/2024   ALBUMIN 4.4 08/10/2024   CALCIUM  9.1 08/10/2024   ANIONGAP 8 03/16/2024   EGFR 47 (L) 08/10/2024   Lab Results  Component Value Date   CHOL 146 05/14/2024   Lab Results  Component Value Date   HDL 37 (L) 05/14/2024   Lab Results  Component Value Date   LDLCALC 82 05/14/2024  Lab Results  Component Value Date   TRIG 155 (H) 05/14/2024   Lab Results  Component Value Date   CHOLHDL 3.9 05/14/2024   Lab Results  Component Value Date   HGBA1C <4.2 (L) 03/01/2021      Assessment & Plan:   Problem List Items Addressed This Visit       Genitourinary   Chronic kidney disease, stage 3a (HCC)   Lab Results  Component Value Date   NA 140 08/10/2024   K 5.1 08/10/2024   CO2 18 (L) 08/10/2024   GLUCOSE 101 (H) 08/10/2024   BUN 17 08/10/2024   CREATININE 1.68 (H) 08/10/2024   CALCIUM  9.1 08/10/2024   EGFR 47 (L) 08/10/2024   GFRNONAA >60 03/16/2024   Blood pressure is low at 96/41. Advised to stay well hydrated and avoid NSAIDs to maintain kidney function. - Ensure adequate hydration. -  Avoid NSAIDs such as Aleve  or ibuprofen .        Other   Sickle cell anemia (HCC) - Primary   We discussed the need for good hydration, monitoring of hydration status, avoidance of heat, cold, stress, and infection triggers. We discussed the need to be adherent with taking home medications as ordered. Patient was reminded of the need to seek medical attention immediately if any symptom of bleeding, anemia, or infection occurs.   Continue Percocet 10 325 mg , 1 tablet every 6 hours as needed for pain I reminded the patient that all patients receiving Schedule II narcotics must be seen for follow within the 3 months in the office.  We reviewed the terms of our pain agreement, including the need to keep medicines in a safe locked location away from children or pets, and the need to report excess sedation . Provided ophthalmology contact information to schedule an appointment for annual eye exam      Relevant Orders   Sickle Cell Panel   ToxAssure Flex 15, Ur   Chronic pain    Chronic back pain rated at 5/10. Last took Percocet yesterday. - Continue Percocet 10-325 mg oral every 6 hours as needed for pain management.       Relevant Orders   ToxAssure Flex 15, Ur   Vitamin D  deficiency   Last vitamin D  Lab Results  Component Value Date   VD25OH 51.2 08/10/2024   Continues to take over-the-counter vitamin D  once a week. Uncertain of the dosage. - Confirm vitamin D  dosage and report via MyChart if higher dose is needed.        Relevant Orders   Sickle Cell Panel   Tobacco use disorder    Continues to smoke a couple of cigarettes a day, taking a few puffs each time. Discussed risks of lung cancer and COPD. - Encouraged smoking cessation       No orders of the defined types were placed in this encounter.   Follow-up: Return in about 3 months (around 02/08/2025), or SCD.    Scott Borak R Melonie Germani, FNP     [1]  Allergies Allergen Reactions   Aspirin Other (See Comments)     Increased heart rate   Tape Rash   Tramadol  Hives and Itching   "

## 2024-11-10 NOTE — Assessment & Plan Note (Signed)
 Lab Results  Component Value Date   NA 140 08/10/2024   K 5.1 08/10/2024   CO2 18 (L) 08/10/2024   GLUCOSE 101 (H) 08/10/2024   BUN 17 08/10/2024   CREATININE 1.68 (H) 08/10/2024   CALCIUM  9.1 08/10/2024   EGFR 47 (L) 08/10/2024   GFRNONAA >60 03/16/2024   Blood pressure is low at 96/41. Advised to stay well hydrated and avoid NSAIDs to maintain kidney function. - Ensure adequate hydration. - Avoid NSAIDs such as Aleve  or ibuprofen .

## 2024-11-10 NOTE — Patient Instructions (Addendum)
 [facility] [address] [place] [address] [phone]   It is important that you exercise regularly at least 30 minutes 5 times a week as tolerated  Think about what you will eat, plan ahead. Choose  clean, green, fresh or frozen over canned, processed or packaged foods which are more sugary, salty and fatty. 70 to 75% of food eaten should be vegetables and fruit. Three meals at set times with snacks allowed between meals, but they must be fruit or vegetables. Aim to eat over a 12 hour period , example 7 am to 7 pm, and STOP after  your last meal of the day. Drink water,generally about 64 ounces per day, no other drink is as healthy. Fruit juice is best enjoyed in a healthy way, by EATING the fruit.  Thanks for choosing [facility] we consider it a privelige to serve you.

## 2024-11-10 NOTE — Assessment & Plan Note (Signed)
" °  Chronic back pain rated at 5/10. Last took Percocet yesterday. - Continue Percocet 10-325 mg oral every 6 hours as needed for pain management.  "

## 2024-11-10 NOTE — Assessment & Plan Note (Addendum)
 Last vitamin D  Lab Results  Component Value Date   VD25OH 51.2 08/10/2024   Continues to take over-the-counter vitamin D  once a week. Uncertain of the dosage. - Confirm vitamin D  dosage and report via MyChart if higher dose is needed.

## 2024-11-11 ENCOUNTER — Other Ambulatory Visit (HOSPITAL_COMMUNITY): Payer: Self-pay

## 2024-11-11 ENCOUNTER — Ambulatory Visit: Payer: Self-pay | Admitting: Nurse Practitioner

## 2024-11-11 DIAGNOSIS — E875 Hyperkalemia: Secondary | ICD-10-CM

## 2024-11-11 LAB — CMP14+CBC/D/PLT+FER+RETIC+V...
ALT: 14 IU/L (ref 0–44)
AST: 32 IU/L (ref 0–40)
Albumin: 4.2 g/dL (ref 3.8–4.9)
Alkaline Phosphatase: 279 IU/L — ABNORMAL HIGH (ref 47–123)
BUN/Creatinine Ratio: 11 (ref 9–20)
BUN: 20 mg/dL (ref 6–24)
Basophils Absolute: 0 x10E3/uL (ref 0.0–0.2)
Basos: 0 %
Bilirubin Total: 2 mg/dL — ABNORMAL HIGH (ref 0.0–1.2)
CO2: 17 mmol/L — ABNORMAL LOW (ref 20–29)
Calcium: 9.6 mg/dL (ref 8.7–10.2)
Chloride: 107 mmol/L — ABNORMAL HIGH (ref 96–106)
Creatinine, Ser: 1.86 mg/dL — ABNORMAL HIGH (ref 0.76–1.27)
EOS (ABSOLUTE): 0.2 x10E3/uL (ref 0.0–0.4)
Eos: 3 %
Ferritin: 384 ng/mL (ref 30–400)
Globulin, Total: 2.3 g/dL (ref 1.5–4.5)
Glucose: 46 mg/dL — ABNORMAL LOW (ref 70–99)
Hematocrit: 21.5 % — ABNORMAL LOW (ref 37.5–51.0)
Hemoglobin: 6.6 g/dL — CL (ref 13.0–17.7)
Immature Grans (Abs): 0 x10E3/uL (ref 0.0–0.1)
Immature Granulocytes: 0 %
Lymphocytes Absolute: 2.6 x10E3/uL (ref 0.7–3.1)
Lymphs: 36 %
MCH: 32.2 pg (ref 26.6–33.0)
MCHC: 30.7 g/dL — ABNORMAL LOW (ref 31.5–35.7)
MCV: 105 fL — ABNORMAL HIGH (ref 79–97)
Monocytes Absolute: 0.9 x10E3/uL (ref 0.1–0.9)
Monocytes: 12 %
NRBC: 5 % — ABNORMAL HIGH (ref 0–0)
Neutrophils Absolute: 3.6 x10E3/uL (ref 1.4–7.0)
Neutrophils: 49 %
Platelets: 153 x10E3/uL (ref 150–450)
Potassium: 5.4 mmol/L — ABNORMAL HIGH (ref 3.5–5.2)
RBC: 2.05 x10E6/uL — CL (ref 4.14–5.80)
RDW: 23 % — ABNORMAL HIGH (ref 11.6–15.4)
Retic Ct Pct: 7.6 % — ABNORMAL HIGH (ref 0.6–2.6)
Sodium: 140 mmol/L (ref 134–144)
Total Protein: 6.5 g/dL (ref 6.0–8.5)
Vit D, 25-Hydroxy: 45.4 ng/mL (ref 30.0–100.0)
WBC: 7.4 x10E3/uL (ref 3.4–10.8)
eGFR: 42 mL/min/1.73 — ABNORMAL LOW

## 2024-11-11 MED ORDER — OXYCODONE-ACETAMINOPHEN 10-325 MG PO TABS
1.0000 | ORAL_TABLET | Freq: Four times a day (QID) | ORAL | 0 refills | Status: AC | PRN
  Filled 2024-11-15: qty 60, 15d supply, fill #0

## 2024-11-11 NOTE — Telephone Encounter (Signed)
 Please advise [facility]

## 2024-11-13 ENCOUNTER — Other Ambulatory Visit (HOSPITAL_COMMUNITY): Payer: Self-pay

## 2024-11-14 LAB — TOXASSURE FLEX 15, UR
6-ACETYLMORPHINE IA: NEGATIVE ng/mL
7-aminoclonazepam: NOT DETECTED ng/mg{creat}
AMPHETAMINES IA: NEGATIVE ng/mL
Alpha-hydroxyalprazolam: NOT DETECTED ng/mg{creat}
Alpha-hydroxymidazolam: NOT DETECTED ng/mg{creat}
Alpha-hydroxytriazolam: NOT DETECTED ng/mg{creat}
Alprazolam: NOT DETECTED ng/mg{creat}
BARBITURATES IA: NEGATIVE ng/mL
BUPRENORPHINE: NEGATIVE
Benzodiazepines: NEGATIVE
Buprenorphine: NOT DETECTED ng/mg{creat}
CANNABINOIDS IA: NEGATIVE ng/mL
COCAINE METABOLITE IA: NEGATIVE ng/mL
Clonazepam: NOT DETECTED ng/mg{creat}
Creatinine: 112 mg/dL
Desalkylflurazepam: NOT DETECTED ng/mg{creat}
Desmethyldiazepam: NOT DETECTED ng/mg{creat}
Desmethylflunitrazepam: NOT DETECTED ng/mg{creat}
Diazepam: NOT DETECTED ng/mg{creat}
ETHYL ALCOHOL Enzymatic: NEGATIVE g/dL
FENTANYL: NEGATIVE
Fentanyl: NOT DETECTED ng/mg{creat}
Flunitrazepam: NOT DETECTED ng/mg{creat}
Lorazepam: NOT DETECTED ng/mg{creat}
METHADONE IA: NEGATIVE ng/mL
METHADONE MTB IA: NEGATIVE ng/mL
Midazolam: NOT DETECTED ng/mg{creat}
Norbuprenorphine: NOT DETECTED ng/mg{creat}
Norfentanyl: NOT DETECTED ng/mg{creat}
OPIATE CLASS IA: NEGATIVE ng/mL
Oxazepam: NOT DETECTED ng/mg{creat}
PHENCYCLIDINE IA: NEGATIVE ng/mL
TAPENTADOL, IA: NEGATIVE ng/mL
TRAMADOL IA: NEGATIVE ng/mL
Temazepam: NOT DETECTED ng/mg{creat}

## 2024-11-14 LAB — OXYCODONE CLASS, MS, UR RFX
Noroxycodone: 196 ng/mg{creat}
Noroxymorphone: 61 ng/mg{creat}
Oxycodone Class Confirmation: POSITIVE
Oxycodone: 47 ng/mg{creat}
Oxymorphone: 190 ng/mg{creat}

## 2024-11-15 ENCOUNTER — Other Ambulatory Visit (HOSPITAL_COMMUNITY): Payer: Self-pay

## 2024-11-15 ENCOUNTER — Other Ambulatory Visit: Payer: Self-pay

## 2025-02-08 ENCOUNTER — Ambulatory Visit: Payer: Self-pay | Admitting: Nurse Practitioner
